# Patient Record
Sex: Male | Born: 1945 | Race: White | Hispanic: No | Marital: Married | Smoking: Former smoker
Health system: Southern US, Community
[De-identification: ages and names within clinical notes are randomized; demographics above are authoritative.]

## PROBLEM LIST (undated history)

## (undated) DIAGNOSIS — Z930 Tracheostomy status: Secondary | ICD-10-CM

## (undated) DIAGNOSIS — M1990 Unspecified osteoarthritis, unspecified site: Secondary | ICD-10-CM

## (undated) DIAGNOSIS — N200 Calculus of kidney: Secondary | ICD-10-CM

## (undated) DIAGNOSIS — Z973 Presence of spectacles and contact lenses: Secondary | ICD-10-CM

## (undated) DIAGNOSIS — R3915 Urgency of urination: Secondary | ICD-10-CM

## (undated) DIAGNOSIS — I10 Essential (primary) hypertension: Secondary | ICD-10-CM

## (undated) DIAGNOSIS — Z8739 Personal history of other diseases of the musculoskeletal system and connective tissue: Secondary | ICD-10-CM

## (undated) DIAGNOSIS — G4733 Obstructive sleep apnea (adult) (pediatric): Secondary | ICD-10-CM

## (undated) DIAGNOSIS — Z8673 Personal history of transient ischemic attack (TIA), and cerebral infarction without residual deficits: Secondary | ICD-10-CM

## (undated) DIAGNOSIS — I6523 Occlusion and stenosis of bilateral carotid arteries: Secondary | ICD-10-CM

## (undated) DIAGNOSIS — Z87442 Personal history of urinary calculi: Secondary | ICD-10-CM

## (undated) DIAGNOSIS — K219 Gastro-esophageal reflux disease without esophagitis: Secondary | ICD-10-CM

## (undated) DIAGNOSIS — R0600 Dyspnea, unspecified: Secondary | ICD-10-CM

## (undated) DIAGNOSIS — R053 Chronic cough: Secondary | ICD-10-CM

## (undated) DIAGNOSIS — N183 Chronic kidney disease, stage 3 (moderate): Secondary | ICD-10-CM

## (undated) DIAGNOSIS — Z7901 Long term (current) use of anticoagulants: Secondary | ICD-10-CM

## (undated) DIAGNOSIS — J189 Pneumonia, unspecified organism: Secondary | ICD-10-CM

## (undated) DIAGNOSIS — R05 Cough: Secondary | ICD-10-CM

## (undated) DIAGNOSIS — E785 Hyperlipidemia, unspecified: Secondary | ICD-10-CM

## (undated) HISTORY — PX: GASTRIC ROUX-EN-Y: SHX5262

## (undated) HISTORY — PX: PERCUTANEOUS NEPHROSTOLITHOTOMY: SHX2207

## (undated) HISTORY — PX: WISDOM TOOTH EXTRACTION: SHX21

## (undated) HISTORY — PX: ORCHIOPEXY: SHX479

## (undated) HISTORY — PX: URETEROSCOPY WITH HOLMIUM LASER LITHOTRIPSY: SHX6645

## (undated) HISTORY — PX: COLONOSCOPY: SHX174

## (undated) HISTORY — PX: TONSILLECTOMY: SUR1361

---
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
## 1985-05-12 HISTORY — PX: TRACHEOSTOMY: SUR1362

## 1998-12-26 ENCOUNTER — Encounter: Payer: Self-pay | Admitting: Urology

## 1998-12-26 ENCOUNTER — Encounter: Payer: Self-pay | Admitting: Internal Medicine

## 1998-12-26 ENCOUNTER — Observation Stay (HOSPITAL_COMMUNITY): Admission: EM | Admit: 1998-12-26 | Discharge: 1998-12-27 | Payer: Self-pay | Admitting: Emergency Medicine

## 2002-03-28 ENCOUNTER — Ambulatory Visit (HOSPITAL_COMMUNITY): Admission: RE | Admit: 2002-03-28 | Discharge: 2002-03-28 | Payer: Self-pay | Admitting: Urology

## 2002-03-28 ENCOUNTER — Encounter: Payer: Self-pay | Admitting: Urology

## 2002-04-14 ENCOUNTER — Observation Stay (HOSPITAL_COMMUNITY): Admission: RE | Admit: 2002-04-14 | Discharge: 2002-04-15 | Payer: Self-pay | Admitting: Urology

## 2002-04-14 ENCOUNTER — Encounter: Payer: Self-pay | Admitting: Urology

## 2005-03-21 ENCOUNTER — Ambulatory Visit: Payer: Self-pay | Admitting: Gastroenterology

## 2005-04-07 ENCOUNTER — Ambulatory Visit: Payer: Self-pay | Admitting: Gastroenterology

## 2005-09-25 ENCOUNTER — Ambulatory Visit (HOSPITAL_BASED_OUTPATIENT_CLINIC_OR_DEPARTMENT_OTHER): Admission: RE | Admit: 2005-09-25 | Discharge: 2005-09-25 | Payer: Self-pay | Admitting: Otolaryngology

## 2005-09-28 ENCOUNTER — Ambulatory Visit: Payer: Self-pay | Admitting: Internal Medicine

## 2008-04-05 ENCOUNTER — Ambulatory Visit: Payer: Self-pay | Admitting: Cardiology

## 2009-09-19 ENCOUNTER — Encounter: Payer: Self-pay | Admitting: Urology

## 2009-09-19 ENCOUNTER — Ambulatory Visit (HOSPITAL_COMMUNITY): Admission: RE | Admit: 2009-09-19 | Discharge: 2009-09-20 | Payer: Self-pay | Admitting: Urology

## 2010-07-30 LAB — CBC: HCT: 42.3 % (ref 39.0–52.0)

## 2010-07-30 LAB — ABO/RH: ABO/RH(D): O NEG

## 2010-09-24 NOTE — Assessment & Plan Note (Signed)
[facility]                            CARDIOLOGY OFFICE NOTE   FRANDY, BASNETT                     MRN:          [id number]  DATE:04/05/2008                            DOB:          [birth year]    PRIMARY:  Ernestina Penna, MD   REASON FOR PRESENTATION:  Evaluate the patient with multiple  cardiovascular risk factors.   HISTORY OF PRESENT ILLNESS:  The patient is a pleasant 65 year old  gentleman who was seen by Dr. Corinda Gubler in 2003.  Apparently, at that  time, he had some T-wave changes, and it was thought that a stress test  would be reasonable.  However, he was at that time too heavy for our  nuclear cameras.  He was seen at [facility], and they determined that one was  not necessary.  He subsequently had gastric bypass surgery in 2004.  He  did lose quite a bit of weight.  He had an exercise treadmill test at  Dr. Kathi Der office in 2006.  This did not demonstrate any evidence of  ischemia or infarct, but it was apparently a submaximal test as he did  not achieve his target heart rate.   Since that time, he has had no overt cardiovascular complaints.  He is  not particularly active, however.  He sometimes walks with his wife for  exercise.  He does not have any chest pressure, neck or arm discomfort.  He does not have any palpitation, presyncope, or syncope.   He does have sleep apnea and has tracheostomy.  He unplugs his system at  night and sleeps well.  He does not have any PND or orthopnea.   PAST MEDICAL HISTORY:  Sleep apnea, hypertension x7-8 years,  hyperlipidemia, morbid obesity, nephrolithiasis.   PAST SURGICAL HISTORY:  Tracheostomy, tongue reduction, septum repair,  gastric bypass surgery.   ALLERGIES:  None.   MEDICATIONS:  1. Caduet 5 mg daily.  2. Nexium 40 mg daily.  3. Multivitamin.  4. Calcium.  5. Aspirin 81 mg daily.   SOCIAL HISTORY:  The patient is married.  He is a Hydrographic surveyor.  He has 3  children.  Quit smoking  in 1970 after one-pack per day for about 8  years.  He does not drink alcohol.   FAMILY HISTORY:  Contributory for his father having coronary disease in  his 42s and 40s.  He is alive at age 76 status post bypass.   REVIEW OF SYSTEMS:  As stated in the HPI and positive for reflux, joint  pains.  Negative for all other systems.   PHYSICAL EXAMINATION:  GENERAL:  The patient is pleasant and in no  distress.  VITAL SIGNS:  Blood pressure 130/64, heart rate 64 and regular, weight  297 pounds, body mass index 42.  HEENT:  Eyelids unremarkable.  Pupils equal, round, and reactive to  light.  Fundi within normal limits.  Oral mucous unremarkable.  NECK:  No jugular venous distention at 45 degrees, carotid upstroke  brisk and symmetric, no bruits, no thyromegaly.  Although the exam is  compromised by his tracheostomy.  LYMPHATICS:  No  cervical, axillary, or inguinal adenopathy.  LUNGS:  Clear to auscultation bilaterally.  BACK:  No costovertebral angle tenderness.  CHEST:  Unremarkable.  HEART:  PMI not displaced or sustained, S1 and S2 within normal limits,  no S3, no S4, no clicks, no rubs, no murmurs.  ABDOMEN:  Morbidly obese, positive bowel sounds normal in frequency and  pitch, no bruits, no rebound, no guarding, no midline pulsatile mass.  No hepatomegaly, no splenomegaly.  SKIN:  No rashes, no nodules.  EXTREMITIES:  Pulses 2+ throughout, no edema, no cyanosis, or clubbing.  NEURO:  Oriented to person, place, and time.  Cranial nerves II through  XII are grossly intact, motor grossly intact.   1. Cardiovascular risk factors.  The patient has multiple      cardiovascular risk factors.  I understand, Dr. Christell Constant, has planned      an exercise treadmill test.  I think this is reasonable.  I think      he has a low pretest probability of obstructive coronary disease.      However, he does have risk factors.  He could be risk stratified.      Most importantly from this I have asked him to  make sure he gets a      prescription for exercise.  He absolutely needs to increase his      aerobic activity.  2. Obesity.  He is starting to have his weight creep up.  We discussed      increased activity to help burn more calories, so he can begin to      bring this down again.  3. Hypertension.  Blood pressure is currently well controlled and he      will continue medications as listed.  4. Dyslipidemia, per Dr. Christell Constant.  It is well controlled, and he will      continue with the meds as listed.  5. Sleep apnea.  The patient had severe sleep apnea and has a close      management of this.  6. Followup.  I will see him back as needed.     Rollene Rotunda, MD, [facility]  Electronically Signed    JH/MedQ  DD: 04/05/2008  DT: 04/06/2008  Job #: [id number]   cc:   Ernestina Penna, M.D.

## 2010-09-27 NOTE — Op Note (Signed)
NAME:  Dennis Spencer, Dennis Spencer                        ACCOUNT NO.:  [id number]   MEDICAL RECORD NO.:  [id number]                   PATIENT TYPE:  AMB   LOCATION:  DAY                                  FACILITY:  [facility]   PHYSICIAN:  Graylin Sperling. Dahlstedt, M.D.          DATE OF BIRTH:  11-17-1945   DATE OF PROCEDURE:  04/14/2002  DATE OF DISCHARGE:                                 OPERATIVE REPORT   PREOPERATIVE DIAGNOSES:  Left ureteral calculi.   POSTOPERATIVE DIAGNOSES:  Left ureteral calculi.   PRINCIPAL PROCEDURE:  Cystoscopy, left ureteroscopy with flexible  ureteroscope, holmium laser lithotripsy of left ureteral calculi, stone  extraction, double J stent placement, interpretive fluoroscopy.   SURGEON:  Kaylib Furness. Dahlstedt, M.D.   ANESTHESIA:  General with tracheostomy tube.   COMPLICATIONS:  None.   BRIEF HISTORY:  This nice 65 year old gentleman has a left ureteral calculi  which we have been following conservatively in the office for approximately  one month. The left ureteral stone located most distal is approximately 9 x  11 mm in size. The patient has had relatively minimal pain, but he has had  nonpassage of three calculi now. Due to his morbid obesity, he is not  considered a candidate for lithotripsy on the extracorporeal machine. Thus,  we have chosen direct ureteroscopy with holmium laser lithotripsy of his  stone. The risks and complications of this procedure have been discussed  with the patient at length. These include but are not limited to injury to  his ureter, bleeding, infection, need for a second procedure. He understands  these and desires to proceed.   DESCRIPTION OF PROCEDURE:  The patient was administered a general anesthetic  after a tracheostomy tube was placed. He was placed in the dorsal lithotomy  position. Genitalia and perineum were prepped and draped. A 22 French  panendoscope was advanced with difficulty over his prostate and into his  bladder.  The left ureter was cannulated with a guidewire which was advanced  under fluoroscopic guidance into the left kidney. A 35 cm ureteral access  catheter was then placed over top of the guidewire. The flexible  ureteroscope was then placed through the access sheath up to the distal most  stone. The 200 and then the 350 micron laser fibers were then used to  attempt fragmentation of the stone. The stone was quite dense, and I was not  able to get significant shattering of the stone but the size of the stone  was diminished with treating the periphery of the stone with the laser. The  smaller stones were easily navigated and fragmented into somewhat smaller  fragments with the laser fiber. After approximately 1 1/2 hours of treating  the large stone, it was felt that I had achieved maximal benefit with the  laser. At this point, I used a nitinol basket to grasp the stone and remove  it once the access sheath had been removed  from the patient. It took a fair  amount of traction to remove this stone but the stone was recovered. The  access sheath was then placed back up in the patient's ureter over the  guidewire and inspection was performed. There were several small fragments  which I was not able to basket and extract. However, it was felt that these  stones should pass on their own. No significant ureteral stricture was  noted. Following extraction of the large fragment, the double J stent was  placed in  the left ureter. A 26 cm x 6 French Surgitek stent was placed using  fluoroscopic and cystoscopic guidance. Good localization was seen proximally  and distally. At this point, the scope was removed after the bladder was  drained.   The patient tolerated the procedure well. He is awakened and taken to PACU  in stable condition.                                               Bertram Millard. Dahlstedt, M.D.    SMD/MEDQ  D:  04/14/2002  T:  04/14/2002  Job:  [id number]

## 2010-09-27 NOTE — Procedures (Signed)
[name], Dennis Spencer              ACCOUNT NO.:  [id number]   MEDICAL RECORD NO.:  [id number]          PATIENT TYPE:  OUT   LOCATION:  SLEEP CENTER                 FACILITY:  [facility]   PHYSICIAN:  Clinton D. Maple Hudson, M.D. DATE OF BIRTH:  July 17, 1945   DATE OF STUDY:  09/25/2005                              NOCTURNAL POLYSOMNOGRAM   REFERRING PHYSICIAN:  Dr. Flo Shanks.   INDICATIONS FOR STUDY:  Hypersomnia with sleep apnea.   EPWORTH SLEEPINESS SCORE:  04/24.   BMI:  37.8.   WEIGHT:  265 pounds.   HOME MEDICATIONS:  Nexium 40 mg, Caduet 5/20, aspirin.   The patient has a history of obstructive sleep apnea and prior surgery  Including chronic tracheostomy.  A split protocol study was requested with  tracheostomy capped.   SLEEP ARCHITECTURE:  Total sleep time 339 minutes with sleep efficiency 83%.  Stage I was 12%, stage II 73%, stages III and IV were absent, REM 14% of  total sleep time.  Sleep latency 11 minutes, REM latency 71 minutes, awake  after sleep onset 61 minutes, arousal index increased at 59.5.  No bedtime  medication was taken.   RESPIRATORY DATA:  Split study protocol.  Apnea/hypopnea index (AHI, RDI)  76.8 obstructive events per hour indicating severe obstructive sleep  apnea/hypopnea syndrome.  This included 143 obstructive apneas and 33  hypopneas before CPAP.  The study was performed with a tracheostomy capped.  He slept only supine.  REM AHI of 31.8 per hour.  CPAP was titrated 22 CWP,  AHI 2 per hour.  A small ResMed ultra mirage full-face mask was used with  heated humidifier.   OXYGEN DATA:  Moderate snoring with oxygen desaturation to a nadir of 78%  before CPAP control.  After CPAP mean oxygen saturation held 95-96% on room  air.   CARDIAC DATA:  Normal sinus rhythm.   MOVEMENT/PARASOMNIA:  Occasional limb jerk with arousal, insignificant.  No  bathroom trips.   IMPRESSION/RECOMMENDATIONS:  1.  Severe obstructive sleep apnea/hypopnea syndrome,  AHI 76.8 per hour      while sleeping only supine.  Moderate snoring with oxygen desaturation      to a nadir of 78%.  The tracheostomy was capped throughout the study.  2.  Successful CPAP titration to 22 CWP, AHI 2 per hour.  A small ResMed      ultra mirage full-face mask was used      with heated humidity.  This pressure will require use of a BiPap machine      with a suggested initial setting of inspiratory 22 expiratory 15.      Clinton D. Maple Hudson, M.D.  Diplomate, Biomedical engineer of Sleep Medicine  Electronically Signed     CDY/MEDQ  D:  09/28/2005 15:07:56  T:  09/29/2005 10:44:17  Job:  [id number]

## 2012-08-10 ENCOUNTER — Other Ambulatory Visit: Payer: Self-pay | Admitting: *Deleted

## 2012-08-10 ENCOUNTER — Other Ambulatory Visit: Payer: Self-pay | Admitting: Physician Assistant

## 2012-08-10 ENCOUNTER — Other Ambulatory Visit: Payer: Self-pay

## 2012-08-10 DIAGNOSIS — M549 Dorsalgia, unspecified: Secondary | ICD-10-CM

## 2012-08-10 MED ORDER — GABAPENTIN 600 MG PO TABS: ORAL_TABLET | ORAL | Status: DC

## 2012-08-10 NOTE — Telephone Encounter (Signed)
Refill on gabapentin 600 mg ,half tab dily, qty 30 per ACM,  [facility]

## 2012-08-10 NOTE — Telephone Encounter (Signed)
Not seen since 10/13

## 2012-09-15 ENCOUNTER — Other Ambulatory Visit: Payer: Self-pay | Admitting: Nurse Practitioner

## 2012-10-11 ENCOUNTER — Telehealth: Payer: Self-pay | Admitting: *Deleted

## 2012-10-11 DIAGNOSIS — M549 Dorsalgia, unspecified: Secondary | ICD-10-CM

## 2012-10-11 MED ORDER — ATORVASTATIN CALCIUM 40 MG PO TABS: 40.0000 mg | ORAL_TABLET | Freq: Every day | ORAL | Status: DC

## 2012-10-11 MED ORDER — GABAPENTIN 600 MG PO TABS: ORAL_TABLET | ORAL | Status: DC

## 2012-10-11 NOTE — Telephone Encounter (Signed)
done

## 2012-11-06 ENCOUNTER — Other Ambulatory Visit: Payer: Self-pay | Admitting: Nurse Practitioner

## 2012-11-08 NOTE — Telephone Encounter (Signed)
Patient should come in if she needs more medication for cough

## 2012-11-08 NOTE — Telephone Encounter (Signed)
LAST OV 10/13. NTBS. DIDN'T SEE MED IN EPIC BUT WAS IN PAPER CHART.

## 2012-11-09 ENCOUNTER — Ambulatory Visit (INDEPENDENT_AMBULATORY_CARE_PROVIDER_SITE_OTHER): Payer: BC Managed Care – PPO | Admitting: Family Medicine

## 2012-11-09 ENCOUNTER — Encounter: Payer: Self-pay | Admitting: Family Medicine

## 2012-11-09 VITALS — BP 151/73 | HR 98 | Temp 98.1°F | Wt 295.8 lb

## 2012-11-09 DIAGNOSIS — I10 Essential (primary) hypertension: Secondary | ICD-10-CM

## 2012-11-09 LAB — POCT CBC
Granulocyte percent: 65 %G (ref 37–80)
Lymph, poc: 1.8 (ref 0.6–3.4)
MCV: 92.2 fL (ref 80–97)
MPV: 7.8 fL (ref 0–99.8)
POC Granulocyte: 3.8 (ref 2–6.9)
POC LYMPH PERCENT: 31.7 %L (ref 10–50)
Platelet Count, POC: 218 10*3/uL (ref 142–424)
RBC: 4.9 M/uL (ref 4.69–6.13)
RDW, POC: 12.7 %
WBC: 5.8 10*3/uL (ref 4.6–10.2)

## 2012-11-09 NOTE — Progress Notes (Signed)
Patient ID: Dennis Spencer, male   DOB: Apr 30, 1946, 67 y.o.   MRN: [id number]   Patient here for follow-up of elevated blood pressure.  He is not exercising and is predominantly  adherent to a low-salt diet.  Blood pressure is well controlled at home. Cardiac symptoms: none. Patient denies: chest pain, claudication, dyspnea and exertional chest pressure/discomfort. Cardiovascular risk factors: dyslipidemia, male gender and obesity (BMI >= 30 kg/m2). Use of agents associated with hypertension: none. History of target organ damage: none.  The following portions of the patient's history were reviewed and updated as appropriate: allergies, current medications, past family history, past medical history, past social history, past surgical history and problem list.  Review of Systems Pertinent items are noted in HPI.     Objective:    BP 151/73  Pulse 98  Temp(Src) 98.1 F (36.7 C) (Oral)  Wt 295 lb 12.8 oz (134.174 kg)  General Appearance:    Alert, cooperative, no distress, appears stated age  Head:    Normocephalic, without obvious abnormality, atraumatic  Eyes:    PERRL, conjunctiva/corneas clear, EOM's intact, fundi    benign, both eyes       Ears:    Normal TM's and external ear canals, both ears  Nose:   Nares normal, septum midline, mucosa normal, no drainage    or sinus tenderness  Throat:   Lips, mucosa, and tongue normal; teeth and gums normal  Neck:   Supple, symmetrical, trachea midline, no adenopathy;       thyroid:  No enlargement/tenderness/nodules; no carotid   bruit or JVD     Lungs:     Clear to auscultation bilaterally, respirations unlabored  Chest wall:    No tenderness or deformity  Heart:    Regular rate and rhythm, S1 and S2 normal, no murmur, rub   or gallop  Abdomen:     Soft, non-tender, bowel sounds active all four quadrants,    no masses, no organomegaly        Extremities:   Extremities normal, atraumatic, no cyanosis or edema  Pulses:   2+ and symmetric  all extremities  Skin:   Skin color, texture, turgor normal, no rashes or lesions            Assessment:    Hypertension, stage 1 . Evidence of target organ damage: none.    Plan:    Medication: no change.  HTN (hypertension) - Plan: POCT CBC, Comprehensive metabolic panel, LDL Cholesterol, Direct, POCT glycosylated hemoglobin (Hb A1C)  Check baseline labs.  Come back in 2-3 months for reevaluation and BP recheck.    Subjective:    Patient here for follow-up of elevated blood pressure.  He {is/is not:9024} exercising and {is/is not:9024} adherent to a low-salt diet.  Blood pressure {is/is not:9024} well controlled at home. Cardiac symptoms: {symptoms:12860}. Patient denies: {symptoms; cardiac:19145}. Cardiovascular risk factors: {risk factors:510}. Use of agents associated with hypertension: {meds:511::"none"}. History of target organ damage: {diagnoses:309 560 7752}.  {Common ambulatory SmartLinks:19316}  Review of Systems {ros; complete:30496}     Objective:    {exam; complete:17964}    Assessment:    Hypertension, {control:10681} ***. Evidence of target organ damage: {target organ:309 560 7752}.    Plan:    {[phone]}

## 2012-11-10 ENCOUNTER — Other Ambulatory Visit: Payer: Self-pay | Admitting: Family Medicine

## 2012-11-10 DIAGNOSIS — M549 Dorsalgia, unspecified: Secondary | ICD-10-CM

## 2012-11-10 LAB — COMPREHENSIVE METABOLIC PANEL
ALT: 12 U/L (ref 0–53)
AST: 18 U/L (ref 0–37)
Albumin: 3.9 g/dL (ref 3.5–5.2)
Alkaline Phosphatase: 63 U/L (ref 39–117)
BUN: 13 mg/dL (ref 6–23)
Calcium: 9.2 mg/dL (ref 8.4–10.5)
Chloride: 104 mEq/L (ref 96–112)
Potassium: 5 mEq/L (ref 3.5–5.3)
Sodium: 140 mEq/L (ref 135–145)
Total Protein: 6.3 g/dL (ref 6.0–8.3)

## 2012-11-10 MED ORDER — AMLODIPINE BESYLATE 5 MG PO TABS: 5.0000 mg | ORAL_TABLET | Freq: Every day | ORAL | Status: DC

## 2012-11-10 MED ORDER — GABAPENTIN 600 MG PO TABS: ORAL_TABLET | ORAL | Status: DC

## 2012-11-10 NOTE — Telephone Encounter (Signed)
Pt [facility] and that he could come and get the shingles shot and Tdap. Pt plans to come in the next week or 2 to get the vaccines

## 2013-05-06 ENCOUNTER — Other Ambulatory Visit: Payer: Self-pay | Admitting: Family Medicine

## 2013-06-15 ENCOUNTER — Other Ambulatory Visit: Payer: Self-pay | Admitting: Family Medicine

## 2013-06-17 NOTE — Telephone Encounter (Signed)
Last seen and last lipid 11/09/12  Dr Ernestina Patches

## 2013-06-18 ENCOUNTER — Encounter (HOSPITAL_COMMUNITY): Payer: Medicare HMO | Admitting: Anesthesiology

## 2013-06-18 ENCOUNTER — Encounter (HOSPITAL_COMMUNITY)
Admission: EM | Disposition: A | Discharge status: Home / Self Care | Payer: Self-pay | Source: Home / Self Care | Attending: Urology

## 2013-06-18 ENCOUNTER — Inpatient Hospital Stay (HOSPITAL_COMMUNITY)
Admission: EM | Admit: 2013-06-18 | Discharge: 2013-06-21 | DRG: 669 | Disposition: A | Discharge status: Home / Self Care | Payer: Medicare HMO | Attending: Urology | Admitting: Urology

## 2013-06-18 ENCOUNTER — Encounter (HOSPITAL_COMMUNITY): Payer: Self-pay | Admitting: Emergency Medicine

## 2013-06-18 ENCOUNTER — Emergency Department (HOSPITAL_COMMUNITY): Payer: Medicare HMO

## 2013-06-18 ENCOUNTER — Emergency Department (HOSPITAL_COMMUNITY): Payer: Medicare HMO | Admitting: Anesthesiology

## 2013-06-18 DIAGNOSIS — E875 Hyperkalemia: Secondary | ICD-10-CM | POA: Diagnosis not present

## 2013-06-18 DIAGNOSIS — N179 Acute kidney failure, unspecified: Secondary | ICD-10-CM | POA: Diagnosis present

## 2013-06-18 DIAGNOSIS — N1330 Unspecified hydronephrosis: Secondary | ICD-10-CM | POA: Diagnosis present

## 2013-06-18 DIAGNOSIS — Z23 Encounter for immunization: Secondary | ICD-10-CM

## 2013-06-18 DIAGNOSIS — N210 Calculus in bladder: Secondary | ICD-10-CM | POA: Diagnosis present

## 2013-06-18 DIAGNOSIS — N201 Calculus of ureter: Principal | ICD-10-CM | POA: Diagnosis present

## 2013-06-18 DIAGNOSIS — G4730 Sleep apnea, unspecified: Secondary | ICD-10-CM | POA: Diagnosis present

## 2013-06-18 DIAGNOSIS — D6489 Other specified anemias: Secondary | ICD-10-CM | POA: Diagnosis present

## 2013-06-18 DIAGNOSIS — Z930 Tracheostomy status: Secondary | ICD-10-CM

## 2013-06-18 DIAGNOSIS — R339 Retention of urine, unspecified: Secondary | ICD-10-CM | POA: Diagnosis present

## 2013-06-18 DIAGNOSIS — N390 Urinary tract infection, site not specified: Secondary | ICD-10-CM

## 2013-06-18 DIAGNOSIS — Z87891 Personal history of nicotine dependence: Secondary | ICD-10-CM

## 2013-06-18 DIAGNOSIS — E7800 Pure hypercholesterolemia, unspecified: Secondary | ICD-10-CM | POA: Diagnosis present

## 2013-06-18 DIAGNOSIS — Z9884 Bariatric surgery status: Secondary | ICD-10-CM

## 2013-06-18 DIAGNOSIS — N200 Calculus of kidney: Secondary | ICD-10-CM | POA: Diagnosis present

## 2013-06-18 DIAGNOSIS — I10 Essential (primary) hypertension: Secondary | ICD-10-CM | POA: Diagnosis present

## 2013-06-18 DIAGNOSIS — N19 Unspecified kidney failure: Secondary | ICD-10-CM

## 2013-06-18 DIAGNOSIS — Z6839 Body mass index (BMI) 39.0-39.9, adult: Secondary | ICD-10-CM

## 2013-06-18 HISTORY — PX: HOLMIUM LASER APPLICATION: SHX5852

## 2013-06-18 HISTORY — PX: CYSTOSCOPY WITH RETROGRADE PYELOGRAM, URETEROSCOPY AND STENT PLACEMENT: SHX5789

## 2013-06-18 HISTORY — DX: Essential (primary) hypertension: I10

## 2013-06-18 LAB — CBC WITH DIFFERENTIAL/PLATELET
Basophils Absolute: 0 K/uL (ref 0.0–0.1)
Basophils Relative: 1 % (ref 0–1)
Eosinophils Absolute: 0.3 K/uL (ref 0.0–0.7)
Eosinophils Relative: 4 % (ref 0–5)
HCT: 37.6 % — ABNORMAL LOW (ref 39.0–52.0)
Hemoglobin: 12.9 g/dL — ABNORMAL LOW (ref 13.0–17.0)
Lymphocytes Relative: 25 % (ref 12–46)
Lymphs Abs: 1.6 K/uL (ref 0.7–4.0)
MCH: 31.2 pg (ref 26.0–34.0)
MCHC: 34.3 g/dL (ref 30.0–36.0)
MCV: 90.8 fL (ref 78.0–100.0)
Monocytes Absolute: 0.6 K/uL (ref 0.1–1.0)
Monocytes Relative: 10 % (ref 3–12)
Neutro Abs: 3.8 K/uL (ref 1.7–7.7)
Neutrophils Relative %: 61 % (ref 43–77)
Platelets: 207 K/uL (ref 150–400)
RBC: 4.14 MIL/uL — ABNORMAL LOW (ref 4.22–5.81)
RDW: 13 % (ref 11.5–15.5)
WBC: 6.3 K/uL (ref 4.0–10.5)

## 2013-06-18 LAB — URINALYSIS, ROUTINE W REFLEX MICROSCOPIC
Bilirubin Urine: NEGATIVE
Glucose, UA: NEGATIVE mg/dL
Ketones, ur: NEGATIVE mg/dL
Nitrite: NEGATIVE
Protein, ur: 100 mg/dL — AB
Specific Gravity, Urine: 1.011 (ref 1.005–1.030)
Urobilinogen, UA: 0.2 mg/dL (ref 0.0–1.0)
pH: 6 (ref 5.0–8.0)

## 2013-06-18 LAB — URINE MICROSCOPIC-ADD ON

## 2013-06-18 LAB — BASIC METABOLIC PANEL
BUN: 36 mg/dL — AB (ref 6–23)
CALCIUM: 8.8 mg/dL (ref 8.4–10.5)
CO2: 22 mEq/L (ref 19–32)
Chloride: 100 mEq/L (ref 96–112)
Creatinine, Ser: 6.25 mg/dL — ABNORMAL HIGH (ref 0.50–1.35)
GFR, EST AFRICAN AMERICAN: 10 mL/min — AB (ref >90)
GFR, EST NON AFRICAN AMERICAN: 8 mL/min — AB (ref >90)
Glucose, Bld: 99 mg/dL (ref 70–99)
POTASSIUM: 4.7 meq/L (ref 3.7–5.3)
Sodium: 140 mEq/L (ref 137–147)

## 2013-06-18 SURGERY — CYSTOURETEROSCOPY, WITH RETROGRADE PYELOGRAM AND STENT INSERTION
Anesthesia: General | Laterality: Bilateral

## 2013-06-18 MED ORDER — SODIUM CHLORIDE 0.9 % IV SOLN
Freq: Once | INTRAVENOUS | Status: AC
  Administered 2013-06-18: 15:00:00 via INTRAVENOUS

## 2013-06-18 MED ORDER — FENTANYL CITRATE 0.05 MG/ML IJ SOLN: 25.0000 ug | INTRAMUSCULAR | Status: DC | PRN

## 2013-06-18 MED ORDER — CEFTRIAXONE SODIUM 1 G IJ SOLR
1.0000 g | Freq: Once | INTRAMUSCULAR | Status: AC
  Administered 2013-06-18: 1 g via INTRAVENOUS
  Filled 2013-06-18: qty 10

## 2013-06-18 MED ORDER — SENNA 8.6 MG PO TABS
1.0000 | ORAL_TABLET | Freq: Two times a day (BID) | ORAL | Status: DC
  Administered 2013-06-18 – 2013-06-21 (×6): 8.6 mg via ORAL
  Filled 2013-06-18 (×6): qty 1

## 2013-06-18 MED ORDER — DEXAMETHASONE SODIUM PHOSPHATE 10 MG/ML IJ SOLN
INTRAMUSCULAR | Status: DC | PRN
  Administered 2013-06-18: 10 mg via INTRAVENOUS

## 2013-06-18 MED ORDER — PROPOFOL 10 MG/ML IV BOLUS
INTRAVENOUS | Status: AC
  Filled 2013-06-18: qty 20

## 2013-06-18 MED ORDER — NEOSTIGMINE METHYLSULFATE 1 MG/ML IJ SOLN
INTRAMUSCULAR | Status: DC | PRN
  Administered 2013-06-18: 4 mg via INTRAVENOUS

## 2013-06-18 MED ORDER — IOHEXOL 300 MG/ML  SOLN
INTRAMUSCULAR | Status: DC | PRN
  Administered 2013-06-18: 50 mL via URETHRAL

## 2013-06-18 MED ORDER — DEXAMETHASONE SODIUM PHOSPHATE 10 MG/ML IJ SOLN
INTRAMUSCULAR | Status: AC
  Filled 2013-06-18: qty 1

## 2013-06-18 MED ORDER — CISATRACURIUM BESYLATE (PF) 10 MG/5ML IV SOLN
INTRAVENOUS | Status: DC | PRN
  Administered 2013-06-18: 5 mg via INTRAVENOUS

## 2013-06-18 MED ORDER — OXYCODONE-ACETAMINOPHEN 5-325 MG PO TABS
1.0000 | ORAL_TABLET | ORAL | Status: DC | PRN
  Administered 2013-06-19 (×2): 1 via ORAL
  Filled 2013-06-18 (×2): qty 1

## 2013-06-18 MED ORDER — AMLODIPINE BESYLATE 5 MG PO TABS
5.0000 mg | ORAL_TABLET | Freq: Every day | ORAL | Status: DC
  Administered 2013-06-19 – 2013-06-21 (×3): 5 mg via ORAL
  Filled 2013-06-18 (×3): qty 1

## 2013-06-18 MED ORDER — PANTOPRAZOLE SODIUM 40 MG PO TBEC
40.0000 mg | DELAYED_RELEASE_TABLET | Freq: Every day | ORAL | Status: DC
  Administered 2013-06-19 – 2013-06-21 (×3): 40 mg via ORAL
  Filled 2013-06-18 (×3): qty 1

## 2013-06-18 MED ORDER — DOCUSATE SODIUM 100 MG PO CAPS
100.0000 mg | ORAL_CAPSULE | Freq: Two times a day (BID) | ORAL | Status: DC
  Administered 2013-06-18 – 2013-06-21 (×6): 100 mg via ORAL
  Filled 2013-06-18 (×7): qty 1

## 2013-06-18 MED ORDER — FENTANYL CITRATE 0.05 MG/ML IJ SOLN
INTRAMUSCULAR | Status: DC | PRN
  Administered 2013-06-18 (×2): 50 ug via INTRAVENOUS

## 2013-06-18 MED ORDER — ONDANSETRON HCL 4 MG/2ML IJ SOLN
INTRAMUSCULAR | Status: AC
  Filled 2013-06-18: qty 2

## 2013-06-18 MED ORDER — GLYCOPYRROLATE 0.2 MG/ML IJ SOLN
INTRAMUSCULAR | Status: DC | PRN
  Administered 2013-06-18: .6 mg via INTRAVENOUS

## 2013-06-18 MED ORDER — SODIUM CHLORIDE 0.9 % IV SOLN
INTRAVENOUS | Status: DC
  Administered 2013-06-18 – 2013-06-20 (×5): via INTRAVENOUS

## 2013-06-18 MED ORDER — ASPIRIN 81 MG PO CHEW
81.0000 mg | CHEWABLE_TABLET | Freq: Every day | ORAL | Status: DC
  Administered 2013-06-19 – 2013-06-21 (×3): 81 mg via ORAL
  Filled 2013-06-18 (×3): qty 1

## 2013-06-18 MED ORDER — LACTATED RINGERS IV SOLN
INTRAVENOUS | Status: DC | PRN
  Administered 2013-06-18: 18:00:00 via INTRAVENOUS

## 2013-06-18 MED ORDER — PROMETHAZINE HCL 25 MG/ML IJ SOLN: 6.2500 mg | INTRAMUSCULAR | Status: DC | PRN

## 2013-06-18 MED ORDER — HYDROMORPHONE HCL PF 1 MG/ML IJ SOLN: 0.5000 mg | INTRAMUSCULAR | Status: DC | PRN

## 2013-06-18 MED ORDER — FENTANYL CITRATE 0.05 MG/ML IJ SOLN
INTRAMUSCULAR | Status: AC
  Filled 2013-06-18: qty 2

## 2013-06-18 MED ORDER — OXYBUTYNIN CHLORIDE 5 MG PO TABS
5.0000 mg | ORAL_TABLET | Freq: Three times a day (TID) | ORAL | Status: DC | PRN
  Filled 2013-06-18: qty 1

## 2013-06-18 MED ORDER — PROPOFOL 10 MG/ML IV BOLUS
INTRAVENOUS | Status: DC | PRN
  Administered 2013-06-18 (×2): 50 mg via INTRAVENOUS
  Administered 2013-06-18: 300 mg via INTRAVENOUS

## 2013-06-18 MED ORDER — LIDOCAINE HCL (CARDIAC) 20 MG/ML IV SOLN
INTRAVENOUS | Status: AC
  Filled 2013-06-18: qty 5

## 2013-06-18 MED ORDER — SODIUM CHLORIDE 0.9 % IR SOLN
Status: DC | PRN
  Administered 2013-06-18 (×3): 1000 mL via INTRAVESICAL

## 2013-06-18 MED ORDER — ATORVASTATIN CALCIUM 40 MG PO TABS
40.0000 mg | ORAL_TABLET | Freq: Every day | ORAL | Status: DC
  Administered 2013-06-19 – 2013-06-20 (×2): 40 mg via ORAL
  Filled 2013-06-18 (×3): qty 1

## 2013-06-18 MED ORDER — ONDANSETRON HCL 4 MG/2ML IJ SOLN
INTRAMUSCULAR | Status: DC | PRN
  Administered 2013-06-18: 4 mg via INTRAVENOUS

## 2013-06-18 MED ORDER — ONDANSETRON HCL 4 MG/2ML IJ SOLN
4.0000 mg | INTRAMUSCULAR | Status: DC | PRN
Start: 2013-06-18 — End: 2013-06-21

## 2013-06-18 MED ORDER — CALCIUM CITRATE 950 (200 CA) MG PO TABS
200.0000 mg | ORAL_TABLET | Freq: Every day | ORAL | Status: DC
  Administered 2013-06-19 – 2013-06-21 (×3): 200 mg via ORAL
  Filled 2013-06-18 (×4): qty 1

## 2013-06-18 SURGICAL SUPPLY — 26 items
BAG URINE DRAINAGE (UROLOGICAL SUPPLIES) ×3 IMPLANT
BAG URINE LEG 500ML (DRAIN) ×2 IMPLANT
BASKET LASER NITINOL 1.9FR (BASKET) ×2 IMPLANT
BASKET STNLS GEMINI 4WIRE 3FR (BASKET) IMPLANT
BASKET ZERO TIP NITINOL 2.4FR (BASKET) IMPLANT
BSKT STON RTRVL 120 1.9FR (BASKET) ×1
BSKT STON RTRVL GEM 120X11 3FR (BASKET)
BSKT STON RTRVL ZERO TP 2.4FR (BASKET)
CATH INTERMIT  6FR 70CM (CATHETERS) ×3 IMPLANT
CATH TIEMANN FOLEY 18FR 5CC (CATHETERS) ×2 IMPLANT
CLOTH BEACON ORANGE TIMEOUT ST (SAFETY) ×3 IMPLANT
DRAPE CAMERA CLOSED 9X96 (DRAPES) ×3 IMPLANT
ELECT REM PT RETURN 9FT ADLT (ELECTROSURGICAL)
ELECTRODE REM PT RTRN 9FT ADLT (ELECTROSURGICAL) IMPLANT
FIBER LASER FLEXIVA 200 (UROLOGICAL SUPPLIES) ×2 IMPLANT
FIBER LASER FLEXIVA 365 (UROLOGICAL SUPPLIES) IMPLANT
GLOVE BIOGEL M STRL SZ7.5 (GLOVE) ×3 IMPLANT
GOWN STRL REUS W/TWL LRG LVL3 (GOWN DISPOSABLE) ×3 IMPLANT
GUIDEWIRE ANG ZIPWIRE 038X150 (WIRE) ×3 IMPLANT
GUIDEWIRE STR DUAL SENSOR (WIRE) ×3 IMPLANT
IV NS IRRIG 3000ML ARTHROMATIC (IV SOLUTION) ×3 IMPLANT
PACK CYSTO (CUSTOM PROCEDURE TRAY) ×3 IMPLANT
STENT CONTOUR 6FRX26X.038 (STENTS) ×4 IMPLANT
SYRINGE 10CC LL (SYRINGE) IMPLANT
SYRINGE IRR TOOMEY STRL 70CC (SYRINGE) IMPLANT
TUBE FEEDING 8FR 16IN STR KANG (MISCELLANEOUS) ×3 IMPLANT

## 2013-06-18 NOTE — Preoperative (Signed)
Beta Blockers   Reason not to administer Beta Blockers:Not Applicable 

## 2013-06-18 NOTE — ED Provider Notes (Signed)
CSN: [id number]     Arrival date & time 06/18/13  1211 History   First MD Initiated Contact with Patient 06/18/13 1223     Chief Complaint  Patient presents with  . Urinary Retention   (Consider location/radiation/quality/duration/timing/severity/associated sxs/prior Treatment) HPI 68 year old male has a history of kidney stones in the past, he states he is painlessly passing multiple kidney stones of the last several weeks, he states now in the last day and a half to 2 days however he has decreased urine output but is still passing stones without hematuria or pain, he does not have any urge to urinate he does not feel as if he has urinary retention, he has no abdominal pain no back pain no chest pain no shortness breath no vomiting no diarrhea no bloody urine no testicular pain no dysuria no treatment prior to arrival and no other concerns. His last urine output was about 4 hours prior to arrival a small amount. Past Medical History  Diagnosis Date  . Kidney stones   . Hypertension   . Hypercholesteremia   . Sleep apnea   . Osteomyelitis    Past Surgical History  Procedure Laterality Date  . Lithotripsy    . Lithotripsy    . Percutaneous nephrostolithotomy     No family history on file. History  Substance Use Topics  . Smoking status: Former Smoker    Types: Cigarettes    Quit date: 05/12/1968  . Smokeless tobacco: Not on file  . Alcohol Use: No    Review of Systems 10 Systems reviewed and are negative for acute change except as noted in the HPI. Allergies  Review of patient's allergies indicates no known allergies.  Home Medications   No current outpatient prescriptions on file. BP 142/61  Pulse 75  Temp(Src) 98.1 F (36.7 C) (Oral)  Resp 16  SpO2 97% Physical Exam  Nursing note and vitals reviewed. Constitutional:  Awake, alert, nontoxic appearance.  HENT:  Head: Atraumatic.  Eyes: Right eye exhibits no discharge. Left eye exhibits no discharge.  Neck: Neck  supple.  Cardiovascular: Normal rate and regular rhythm.   No murmur heard. Pulmonary/Chest: Effort normal and breath sounds normal. No respiratory distress. He has no wheezes. He has no rales. He exhibits no tenderness.  Abdominal: Soft. Bowel sounds are normal. He exhibits no distension and no mass. There is no tenderness. There is no rebound and no guarding.  Limited bedside ultrasound reveals what appears to be a small bladder  Musculoskeletal: He exhibits no tenderness.  Baseline ROM, no obvious new focal weakness.  Neurological: He is alert.  Mental status and motor strength appears baseline for patient and situation.  Skin: No rash noted.  Psychiatric: He has a normal mood and affect.    ED Course  Procedures (including critical care time) Patient / Family / Caregiver informed of clinical course, understand medical decision-making process, and agree with plan.Pt stable in ED with no significant deterioration in condition. D/w Urology who will see Pt in ED for OR/admit. [place], ROUTINE W REFLEX MICROSCOPIC - Abnormal; Notable for the following:    APPearance CLOUDY (*)    Hgb urine dipstick MODERATE (*)    Protein, ur 100 (*)    Leukocytes, UA MODERATE (*)    All other components within normal limits  BASIC METABOLIC PANEL - Abnormal; Notable for the following:    BUN 36 (*)    Creatinine, Ser 6.25 (*)    GFR  calc non Af Amer 8 (*)    GFR calc Af Amer 10 (*)    All other components within normal limits  CBC WITH DIFFERENTIAL - Abnormal; Notable for the following:    RBC 4.14 (*)    Hemoglobin 12.9 (*)    HCT 37.6 (*)    All other components within normal limits  URINE MICROSCOPIC-ADD ON - Abnormal; Notable for the following:    Bacteria, UA FEW (*)    Crystals CA OXALATE CRYSTALS (*)    All other components within normal limits  URINE CULTURE   Imaging Review Ct Abdomen Pelvis Wo Contrast  06/18/2013   CLINICAL DATA:  Urinary  retention.  EXAM: CT ABDOMEN AND PELVIS WITHOUT CONTRAST  TECHNIQUE: Multidetector CT imaging of the abdomen and pelvis was performed following the standard protocol without intravenous contrast.  COMPARISON:  CT UROGRAM dated 08/15/2009  FINDINGS: Lower Chest: Clear lung bases. Normal heart size without pericardial or pleural effusion. Right coronary artery atherosclerosis. A small hiatal hernia.  Abdomen/Pelvis: Normal infused appearance of the liver, spleen. Surgical changes within the proximal stomach. Fatty atrophy throughout the pancreas.  A small stone within a contracted gallbladder, unchanged. No pericholecystic edema or biliary ductal dilatation.  Normal adrenal glands.  Bilateral renal collecting system stones. Larger stone burden on the left. Extensive left-sided renal cortical thinning, especially in the lower pole. Moderate bilateral hydroureteronephrosis is increased since 08/15/2009. Multiple stones are identified within the left extrarenal pelvis. Distal left ureteric stones x2. The larger measures 8 x 8 mm on coronal image 75. Mid right ureteric stones measure conglomerate 1.3 cm on image 66 transverse. Just inferior to this is a 11 mm stone.  Multiple bladder calculi.  Aortic and branch vessel atherosclerosis. No retroperitoneal or retrocrural adenopathy. Rectal underdistention  Scattered colonic diverticula. Normal terminal ileum and appendix. There are surgical changes within the mid small bowel. No obstruction.  No pelvic adenopathy. Normal prostate, without significant free pelvic fluid.  Bones/Musculoskeletal: Mild chronic wedging of the superior endplate of L3.  IMPRESSION: 1. Moderate bilateral hydroureteronephrosis. Multiple large ureteric stones bilaterally. Multiple bladder calculi. 2. Nephrolithiasis.  Markedly scarred left kidney. 3. Cholelithiasis. 4. Coronary artery atherosclerosis.   Electronically Signed   By: Abigail Miyamoto M.D.   On: 06/18/2013 15:30      MDM   1. Renal  failure   2. Ureterolithiasis   3. UTI (urinary tract infection)    The patient appears reasonably stabilized for admission considering the current resources, flow, and capabilities available in the ED at this time, and I doubt any other [facility] requiring further screening and/or treatment in the ED prior to admission.    Babette Relic, MD 06/18/13 2024

## 2013-06-18 NOTE — Anesthesia Procedure Notes (Signed)
Procedure Name: Intubation Date/Time: 06/18/2013 6:40 PM Performed by: Danley Danker L Patient Re-evaluated:Patient Re-evaluated prior to inductionOxygen Delivery Method: Circle system utilized Preoxygenation: Pre-oxygenation with 100% oxygen Intubation Type: IV induction Tube size: 6.0 mm Number of attempts: 1 Airway Equipment and Method: Tracheostomy Placement Confirmation: positive ETCO2 and breath sounds checked- equal and bilateral Tube secured with: Tape Comments: Patient with 6.0 Trach.  Trach removed and replaced with oral 6.0 ETT without difficulty.

## 2013-06-18 NOTE — Transfer of Care (Signed)
Immediate Anesthesia Transfer of Care Note  Patient: Dennis Spencer  Procedure(s) Performed: Procedure(s): CYSTOSCOPY WITH RETROGRADE PYELOGRAM, Left URETEROSCOPY with laser , AND bilateral STENT PLACEMENT (Bilateral) HOLMIUM LASER APPLICATION (Bilateral)  Patient Location: PACU  Anesthesia Type:General  Level of Consciousness: awake, alert  and oriented  Airway & Oxygen Therapy: Patient Spontanous Breathing and Patient connected to face mask oxygen  Post-op Assessment: Report given to PACU RN and Post -op Vital signs reviewed and stable  Post vital signs: Reviewed and stable  Complications: No apparent anesthesia complications

## 2013-06-18 NOTE — ED Notes (Signed)
Patient presents today with a chief complaint of urinary retention since Friday morning around midnight. Patient reports he's urinating about 50cc of urine twice daily, denies fullness and pressure to bladder. Patient has a history of kidney stones and reports he's passed about 7 stones since yesterday.

## 2013-06-18 NOTE — ED Notes (Signed)
Pt given urinal and made aware of the need for urine

## 2013-06-18 NOTE — ED Notes (Signed)
Patient transported to CT 

## 2013-06-18 NOTE — H&P (Signed)
Dennis Spencer is an 68 y.o. male.    Chief Complaint:  Bilateral Large Ureteral Stones, Acute Renal Failure  HPI:   1 - Bilateral Large Ureteral Stones, Acute Renal Failure - Long h/o nephrolithiasis with prior ureteroscopy, PCNL, lithotripsy x many. No GU eval in > 5 years. Presents to ER today with >24 hour anuria and noted to have very large volume bilateral ureteral and renal stones including Rt 57m and 115mureteral + rt punctate renal + Lt distal 2cm ureteral + Left renal >2cm stone. Cr >6. K wnl. Baseline Cr 1.0.  PMH sig for morbid obesity s/p R+Y 2004, sleep apnea / trach in 1980s. Denies ischemic heart disease.   Today [facility] seen in ER for above. Last mean > 8 hours ago.   Past Medical History  Diagnosis Date  . Kidney stones   . Hypertension   . Hypercholesteremia   . Sleep apnea   . Osteomyelitis     Past Surgical History  Procedure Laterality Date  . Lithotripsy    . Lithotripsy    . Percutaneous nephrostolithotomy      No family history on file. Social History:  reports that he quit smoking about 45 years ago. His smoking use included Cigarettes. He smoked 0.00 packs per day. He does not have any smokeless tobacco history on file. He reports that he does not drink alcohol or use illicit drugs.  Allergies: No Known Allergies   (Not in a hospital admission)  Results for orders placed during the hospital encounter of 06/18/13 (from the past 48 hour(s))  BASIC METABOLIC PANEL     Status: Abnormal   Collection Time    06/18/13 12:40 PM      Result Value Range   Sodium 140  137 - 147 mEq/L   Potassium 4.7  3.7 - 5.3 mEq/L   Chloride 100  96 - 112 mEq/L   CO2 22  19 - 32 mEq/L   Glucose, Bld 99  70 - 99 mg/dL   BUN 36 (*) 6 - 23 mg/dL   Creatinine, Ser 6.25 (*) 0.50 - 1.35 mg/dL   Calcium 8.8  8.4 - 10.5 mg/dL   GFR calc non Af Amer 8 (*) >90 mL/min   GFR calc Af Amer 10 (*) >90 mL/min   Comment: (NOTE)     The eGFR has been calculated using the CKD  EPI equation.     This calculation has not been validated in all clinical situations.     eGFR's persistently <90 mL/min signify possible Chronic Kidney     Disease.  CBC WITH DIFFERENTIAL     Status: Abnormal   Collection Time    06/18/13 12:40 PM      Result Value Range   WBC 6.3  4.0 - 10.5 K/uL   RBC 4.14 (*) 4.22 - 5.81 MIL/uL   Hemoglobin 12.9 (*) 13.0 - 17.0 g/dL   HCT 37.6 (*) 39.0 - 52.0 %   MCV 90.8  78.0 - 100.0 fL   MCH 31.2  26.0 - 34.0 pg   MCHC 34.3  30.0 - 36.0 g/dL   RDW 13.0  11.5 - 15.5 %   Platelets 207  150 - 400 K/uL   Neutrophils Relative % 61  43 - 77 %   Neutro Abs 3.8  1.7 - 7.7 K/uL   Lymphocytes Relative 25  12 - 46 %   Lymphs Abs 1.6  0.7 - 4.0 K/uL   Monocytes Relative 10  3 - 12 %   Monocytes Absolute 0.6  0.1 - 1.0 K/uL   Eosinophils Relative 4  0 - 5 %   Eosinophils Absolute 0.3  0.0 - 0.7 K/uL   Basophils Relative 1  0 - 1 %   Basophils Absolute 0.0  0.0 - 0.1 K/uL  URINALYSIS, ROUTINE W REFLEX MICROSCOPIC     Status: Abnormal   Collection Time    06/18/13  1:14 PM      Result Value Range   Color, Urine YELLOW  YELLOW   APPearance CLOUDY (*) CLEAR   Specific Gravity, Urine 1.011  1.005 - 1.030   pH 6.0  5.0 - 8.0   Glucose, UA NEGATIVE  NEGATIVE mg/dL   Hgb urine dipstick MODERATE (*) NEGATIVE   Bilirubin Urine NEGATIVE  NEGATIVE   Ketones, ur NEGATIVE  NEGATIVE mg/dL   Protein, ur 100 (*) NEGATIVE mg/dL   Urobilinogen, UA 0.2  0.0 - 1.0 mg/dL   Nitrite NEGATIVE  NEGATIVE   Leukocytes, UA MODERATE (*) NEGATIVE  URINE MICROSCOPIC-ADD ON     Status: Abnormal   Collection Time    06/18/13  1:14 PM      Result Value Range   WBC, UA 11-20  <3 WBC/hpf   RBC / HPF 11-20  <3 RBC/hpf   Bacteria, UA FEW (*) RARE   Crystals CA OXALATE CRYSTALS (*) NEGATIVE   Ct Abdomen Pelvis Wo Contrast  06/18/2013   CLINICAL DATA:  Urinary retention.  EXAM: CT ABDOMEN AND PELVIS WITHOUT CONTRAST  TECHNIQUE: Multidetector CT imaging of the abdomen and pelvis  was performed following the standard protocol without intravenous contrast.  COMPARISON:  CT UROGRAM dated 08/15/2009  FINDINGS: Lower Chest: Clear lung bases. Normal heart size without pericardial or pleural effusion. Right coronary artery atherosclerosis. A small hiatal hernia.  Abdomen/Pelvis: Normal infused appearance of the liver, spleen. Surgical changes within the proximal stomach. Fatty atrophy throughout the pancreas.  A small stone within a contracted gallbladder, unchanged. No pericholecystic edema or biliary ductal dilatation.  Normal adrenal glands.  Bilateral renal collecting system stones. Larger stone burden on the left. Extensive left-sided renal cortical thinning, especially in the lower pole. Moderate bilateral hydroureteronephrosis is increased since 08/15/2009. Multiple stones are identified within the left extrarenal pelvis. Distal left ureteric stones x2. The larger measures 8 x 8 mm on coronal image 75. Mid right ureteric stones measure conglomerate 1.3 cm on image 66 transverse. Just inferior to this is a 11 mm stone.  Multiple bladder calculi.  Aortic and branch vessel atherosclerosis. No retroperitoneal or retrocrural adenopathy. Rectal underdistention  Scattered colonic diverticula. Normal terminal ileum and appendix. There are surgical changes within the mid small bowel. No obstruction.  No pelvic adenopathy. Normal prostate, without significant free pelvic fluid.  Bones/Musculoskeletal: Mild chronic wedging of the superior endplate of L3.  IMPRESSION: 1. Moderate bilateral hydroureteronephrosis. Multiple large ureteric stones bilaterally. Multiple bladder calculi. 2. Nephrolithiasis.  Markedly scarred left kidney. 3. Cholelithiasis. 4. Coronary artery atherosclerosis.   Electronically Signed   By: Abigail Miyamoto M.D.   On: 06/18/2013 15:30    Review of Systems  Constitutional: Positive for malaise/fatigue. Negative for fever, chills and weight loss.  HENT: Negative.   Eyes: Negative.    Respiratory: Negative.   Cardiovascular: Negative.  Negative for chest pain, palpitations and orthopnea.  Gastrointestinal: Negative.  Negative for nausea and vomiting.  Genitourinary:       Anuria x 24 hours  Musculoskeletal: Negative.   Skin: Negative.  Neurological: Negative.   Endo/Heme/Allergies: Negative.   Psychiatric/Behavioral: Negative.     Blood pressure 142/61, pulse 75, temperature 98.1 F (36.7 C), temperature source Oral, resp. rate 16, SpO2 97.00%. Physical Exam  Constitutional: He appears well-developed and well-nourished.  Morbid obesity  Eyes: EOM are normal. Pupils are equal, round, and reactive to light.  Neck: Normal range of motion. Neck supple.  Cardiovascular: Normal rate and regular rhythm.   Respiratory: Effort normal.  GI: Soft. Bowel sounds are normal.  Genitourinary: Penis normal.  Musculoskeletal: Normal range of motion.  Neurological: He is alert.  Skin: Skin is warm and dry.  Psychiatric: He has a normal mood and affect. His behavior is normal. Judgment and thought content normal.     Assessment/Plan  1 - Bilateral Large Ureteral Stones, Acute Renal Failure - Needs urgent renal decompression. Discussed options of bilat neph tubes followed by staged surgery (likley need at least 3) v. Attempt bilatera stent with or withotu ureteroscopy / laser today in effort to decompress kidneys and make some progress on stones. I specifically warned him that he may still require neph tubes even in stents/ureteroscopy attempted given stone volume. He wants trial of stents / ureteroscopy. Risks including bleeding, infection, damage to kidney / ureter / bladder / loss of kidney, need for staged surgery discussed. Rare risks including DVT, PE, MI, CVA, also reinforced. Will need admit post-op with foley to monitor post-obstructive diuresis.    Climmie Buelow 06/18/2013, 4:52 PM

## 2013-06-18 NOTE — Brief Op Note (Signed)
06/18/2013  8:01 PM  PATIENT:  Dennis Spencer  68 y.o. male  PRE-OPERATIVE DIAGNOSIS:  renal failure, bilateral ureteral stones  POST-OPERATIVE DIAGNOSIS:  renal failure, bilateral ureteral stones  PROCEDURE:  Procedure(s): CYSTOSCOPY WITH RETROGRADE PYELOGRAM, Left URETEROSCOPY with laser , AND bilateral STENT PLACEMENT (Bilateral) HOLMIUM LASER APPLICATION (Bilateral)  SURGEON:  Surgeon(s) and Role:    * Alexis Frock, MD - Primary  PHYSICIAN ASSISTANT:   ASSISTANTS: none   ANESTHESIA:   general  EBL:     BLOOD ADMINISTERED:none  DRAINS: 85F foley to straight drain   LOCAL MEDICATIONS USED:  NONE  SPECIMEN:  Source of Specimen:  Left Ureteral and Bladder Stones  DISPOSITION OF SPECIMEN:  [facility] for compositional analysis  COUNTS:  YES  TOURNIQUET:  * No tourniquets in log *  DICTATION: .Other Dictation: Dictation Number [id number]  PLAN OF CARE: Admit to inpatient   PATIENT DISPOSITION:  PACU - hemodynamically stable.   Delay start of Pharmacological VTE agent (>24hrs) due to surgical blood loss or risk of bleeding: not applicable

## 2013-06-18 NOTE — Anesthesia Preprocedure Evaluation (Addendum)
Anesthesia Evaluation  Patient identified by MRN, date of birth, ID band Patient awake    Reviewed: Allergy & Precautions, H&P , NPO status , Patient's Chart, lab work & pertinent test results  Airway Mallampati: II TM Distance: >3 FB Neck ROM: Full    Dental no notable dental hx.    Pulmonary sleep apnea , former smoker,  breath sounds clear to auscultation  + decreased breath sounds      Cardiovascular hypertension, Pt. on medications Rhythm:Regular Rate:Normal     Neuro/Psych negative neurological ROS  negative psych ROS   GI/Hepatic negative GI ROS, Neg liver ROS,   Endo/Other  Morbid obesity  Renal/GU negative Renal ROS  negative genitourinary   Musculoskeletal negative musculoskeletal ROS (+)   Abdominal (+) + obese,   Peds negative pediatric ROS (+)  Hematology negative hematology ROS (+)   Anesthesia Other Findings   Reproductive/Obstetrics negative OB ROS                          Anesthesia Physical Anesthesia Plan  ASA: IV  Anesthesia Plan: General   Post-op Pain Management:    Induction: Intravenous  Airway Management Planned: Tracheostomy  Additional Equipment:   Intra-op Plan:   Post-operative Plan:   Informed Consent: I have reviewed the patients History and Physical, chart, labs and discussed the procedure including the risks, benefits and alternatives for the proposed anesthesia with the patient or authorized representative who has indicated his/her understanding and acceptance.   Dental advisory given  Plan Discussed with: CRNA and Surgeon  Anesthesia Plan Comments:         Anesthesia Quick Evaluation

## 2013-06-19 ENCOUNTER — Encounter (HOSPITAL_COMMUNITY): Payer: Self-pay

## 2013-06-19 LAB — BASIC METABOLIC PANEL
BUN: 42 mg/dL — ABNORMAL HIGH (ref 6–23)
CALCIUM: 8.2 mg/dL — AB (ref 8.4–10.5)
CO2: 21 meq/L (ref 19–32)
Chloride: 102 mEq/L (ref 96–112)
Creatinine, Ser: 6.47 mg/dL — ABNORMAL HIGH (ref 0.50–1.35)
GFR calc Af Amer: 9 mL/min — ABNORMAL LOW (ref >90)
GFR calc non Af Amer: 8 mL/min — ABNORMAL LOW (ref >90)
Glucose, Bld: 183 mg/dL — ABNORMAL HIGH (ref 70–99)
POTASSIUM: 5.5 meq/L — AB (ref 3.7–5.3)
SODIUM: 138 meq/L (ref 137–147)

## 2013-06-19 LAB — RENAL FUNCTION PANEL
Albumin: 2.9 g/dL — ABNORMAL LOW (ref 3.5–5.2)
BUN: 41 mg/dL — AB (ref 6–23)
CHLORIDE: 100 meq/L (ref 96–112)
CO2: 21 meq/L (ref 19–32)
Calcium: 7.9 mg/dL — ABNORMAL LOW (ref 8.4–10.5)
Creatinine, Ser: 6.21 mg/dL — ABNORMAL HIGH (ref 0.50–1.35)
GFR calc Af Amer: 10 mL/min — ABNORMAL LOW (ref >90)
GFR, EST NON AFRICAN AMERICAN: 8 mL/min — AB (ref >90)
Glucose, Bld: 146 mg/dL — ABNORMAL HIGH (ref 70–99)
Phosphorus: 5.2 mg/dL — ABNORMAL HIGH (ref 2.3–4.6)
Potassium: 4.8 mEq/L (ref 3.7–5.3)
Sodium: 136 mEq/L — ABNORMAL LOW (ref 137–147)

## 2013-06-19 LAB — GLUCOSE, CAPILLARY
GLUCOSE-CAPILLARY: 185 mg/dL — AB (ref 70–99)
GLUCOSE-CAPILLARY: 140 mg/dL — AB (ref 70–99)
Glucose-Capillary: 168 mg/dL — ABNORMAL HIGH (ref 70–99)
Glucose-Capillary: 120 mg/dL — ABNORMAL HIGH (ref 70–99)

## 2013-06-19 LAB — URINE CULTURE: Colony Count: 4000

## 2013-06-19 LAB — HEMOGLOBIN A1C
Hgb A1c MFr Bld: 5.9 % — ABNORMAL HIGH (ref <5.7)
Mean Plasma Glucose: 123 mg/dL — ABNORMAL HIGH (ref <117)

## 2013-06-19 MED ORDER — GABAPENTIN 300 MG PO CAPS
300.0000 mg | ORAL_CAPSULE | Freq: Every day | ORAL | Status: DC
  Administered 2013-06-19 – 2013-06-20 (×2): 300 mg via ORAL
  Filled 2013-06-19 (×3): qty 1

## 2013-06-19 MED ORDER — INSULIN ASPART 100 UNIT/ML ~~LOC~~ SOLN
0.0000 [IU] | Freq: Three times a day (TID) | SUBCUTANEOUS | Status: DC
  Administered 2013-06-19 (×2): 4 [IU] via SUBCUTANEOUS

## 2013-06-19 MED ORDER — INSULIN ASPART 100 UNIT/ML ~~LOC~~ SOLN: 0.0000 [IU] | Freq: Every day | SUBCUTANEOUS | Status: DC

## 2013-06-19 MED ORDER — INSULIN ASPART 100 UNIT/ML ~~LOC~~ SOLN
4.0000 [IU] | Freq: Three times a day (TID) | SUBCUTANEOUS | Status: DC
  Administered 2013-06-19 – 2013-06-20 (×5): 4 [IU] via SUBCUTANEOUS

## 2013-06-19 MED ORDER — INFLUENZA VAC SPLIT QUAD 0.5 ML IM SUSP
0.5000 mL | INTRAMUSCULAR | Status: AC
  Administered 2013-06-20: 0.5 mL via INTRAMUSCULAR
  Filled 2013-06-19 (×3): qty 0.5

## 2013-06-19 NOTE — Progress Notes (Signed)
1 Day Post-Op  Subjective:  1 - Bilateral Large Ureteral Stones - s/p emergent cysto, bilateral stents, left first stage ureteroscopic laser lithotripsy 06/18/13 for large bilateral ureteral stones and renal stones.   2 - Acute Renal Failure - Cr on admission 6 with baseline of 1.0 several most ago. K normal on admit but up to 5.5 2/8. Having vigorous diuresis as exppected. Nephrology consult pending.  Today Jonnatan is w/o complaints. Foley working well. Pain controlled.   Objective: Vital signs in last 24 hours: Temp:  [98 F (36.7 C)-98.7 F (37.1 C)] 98.4 F (36.9 C) (02/08 0611) Pulse Rate:  [51-95] 70 (02/08 0611) Resp:  [12-18] 18 (02/08 0611) BP: (115-160)/(49-65) 128/65 mmHg (02/08 0611) SpO2:  [93 %-100 %] 94 % (02/08 0611) Weight:  [133.811 kg (295 lb)] 133.811 kg (295 lb) (02/07 2200)    Intake/Output from previous day: 02/07 0701 - 02/08 0700 In: 1617.5 [I.V.:1617.5] Out: 2900 [Urine:2900] Intake/Output this shift:    General appearance: alert, cooperative, appears stated age and wife at bedside Head: Normocephalic, without obvious abnormality, atraumatic Eyes: conjunctivae/corneas clear. PERRL, EOM's intact. Fundi benign. Ears: normal TM's and external ear canals both ears Nose: Nares normal. Septum midline. Mucosa normal. No drainage or sinus tenderness. Throat: lips, mucosa, and tongue normal; teeth and gums normal and trach in place c/d/i. Neck: no adenopathy, no carotid bruit, no JVD, supple, symmetrical, trachea midline and thyroid not enlarged, symmetric, no tenderness/mass/nodules Back: symmetric, no curvature. ROM normal. No CVA tenderness. Resp: clear to auscultation bilaterally Chest wall: no tenderness Cardio: regular rate and rhythm, S1, S2 normal, no murmur, click, rub or gallop GI: soft, non-tender; bowel sounds normal; no masses,  no organomegaly and morbid truncal obesity, prior scars c/d/i w/o hernias.  Male genitalia: normal, Foley c/d/i with  vigorous clear urien output.  Extremities: extremities normal, atraumatic, no cyanosis or edema Pulses: 2+ and symmetric Skin: Skin color, texture, turgor normal. No rashes or lesions Lymph nodes: Cervical, supraclavicular, and axillary nodes normal. Neurologic: Grossly normal  Lab Results:   Recent Labs  06/18/13 1240  WBC 6.3  HGB 12.9*  HCT 37.6*  PLT 207   BMET  Recent Labs  06/18/13 1240 06/19/13 0540  NA 140 138  K 4.7 5.5*  CL 100 102  CO2 22 21  GLUCOSE 99 183*  BUN 36* 42*  CREATININE 6.25* 6.47*  CALCIUM 8.8 8.2*   PT/INR No results found for this basename: LABPROT, INR,  in the last 72 hours ABG No results found for this basename: PHART, PCO2, PO2, HCO3,  in the last 72 hours  Studies/Results: Ct Abdomen Pelvis Wo Contrast  06/18/2013   CLINICAL DATA:  Urinary retention.  EXAM: CT ABDOMEN AND PELVIS WITHOUT CONTRAST  TECHNIQUE: Multidetector CT imaging of the abdomen and pelvis was performed following the standard protocol without intravenous contrast.  COMPARISON:  CT UROGRAM dated 08/15/2009  FINDINGS: Lower Chest: Clear lung bases. Normal heart size without pericardial or pleural effusion. Right coronary artery atherosclerosis. A small hiatal hernia.  Abdomen/Pelvis: Normal infused appearance of the liver, spleen. Surgical changes within the proximal stomach. Fatty atrophy throughout the pancreas.  A small stone within a contracted gallbladder, unchanged. No pericholecystic edema or biliary ductal dilatation.  Normal adrenal glands.  Bilateral renal collecting system stones. Larger stone burden on the left. Extensive left-sided renal cortical thinning, especially in the lower pole. Moderate bilateral hydroureteronephrosis is increased since 08/15/2009. Multiple stones are identified within the left extrarenal pelvis. Distal left ureteric stones  x2. The larger measures 8 x 8 mm on coronal image 75. Mid right ureteric stones measure conglomerate 1.3 cm on image 66  transverse. Just inferior to this is a 11 mm stone.  Multiple bladder calculi.  Aortic and branch vessel atherosclerosis. No retroperitoneal or retrocrural adenopathy. Rectal underdistention  Scattered colonic diverticula. Normal terminal ileum and appendix. There are surgical changes within the mid small bowel. No obstruction.  No pelvic adenopathy. Normal prostate, without significant free pelvic fluid.  Bones/Musculoskeletal: Mild chronic wedging of the superior endplate of L3.  IMPRESSION: 1. Moderate bilateral hydroureteronephrosis. Multiple large ureteric stones bilaterally. Multiple bladder calculi. 2. Nephrolithiasis.  Markedly scarred left kidney. 3. Cholelithiasis. 4. Coronary artery atherosclerosis.   Electronically Signed   By: Abigail Miyamoto M.D.   On: 06/18/2013 15:30    Anti-infectives: Anti-infectives   Start     Dose/Rate Route Frequency Ordered Stop   06/18/13 1500  cefTRIAXone (ROCEPHIN) 1 g in dextrose 5 % 50 mL IVPB     1 g 100 mL/hr over 30 Minutes Intravenous  Once 06/18/13 1448 06/18/13 1657      Assessment/Plan:  1 - Bilateral Large Ureteral Stones - s/p first of likely 3 stage surgery. Will need left PCNL, Rt URS staged in several weeks after GFR recovery. Also discussed role of metabolic eval as he has extremely high risk stone disease.   2 - Acute Renal Failure - post-obstructive diuresis as expected. Continue IVF at 150cc/hr. Nephrology consult as some mild hyperkalemia now. Begin insulin SSI to help with K. Recheck afternoon renal panel. Overall fairly good prognosis for GFR recover as likely fairly acute renal failure.   3 - Disposition - remain in house. Will likely be in for several days until post-obstructive diuresis resolves and electrolyte abnormalities normalize.   [facility], [place] Devin 06/19/2013

## 2013-06-19 NOTE — Op Note (Signed)
NAMEHILLERY, BHALLA NO.:  [id number]  MEDICAL RECORD NO.:  [id number]  LOCATION:  7035                         FACILITY:  [facility]  PHYSICIAN:  Alexis Frock, MD     DATE OF BIRTH:  [birth year]  DATE OF PROCEDURE:  06/18/2013 DATE OF DISCHARGE:                              OPERATIVE REPORT   DIAGNOSES:  Bilateral large volume ureteral stones, bilateral renal stones, and acute renal failure with anuria.  PROCEDURE: 1. Cystoscopy with bilateral retrograde pyelogram interpretation. 2. Insertion of bilateral ureteral stents 6 x 26, no tether. 3. Left ureteroscopy with laser lithotripsy. 4. Cystolitholapaxy.  FINDINGS: 1. Multifocal bladder stones consistent with likely prior ureteral     stones. 2. Bilateral massive hydroureteronephrosis with bilateral distal large     volume ureteral stones. 3. Clearance of left distal ureteral stone with ureteroscopy and laser     lithotripsy.  INDICATION:  Dennis Spencer is a pleasant 68 year old gentleman with history of morbid obesity, status post Roux-en-Y gastric bypass as well as large volume recurrent nephrolithiasis, status post multiple prior procedures including ureteroscopy, shockwave lithotripsy, and percutaneous stone surgery.  His most recent urologic evaluation was greater than 5 years ago.  Per the patient, he presented to the [facility] this afternoon with approximately 24 hours of anuria.  The CT imaging corroborated large volume bilateral ureteral stones with large hydronephrosis consistent with acute obstruction.  He also had a rise in his creatinine from baseline of 1-6.  His potassium was normal.  Options were discussed including urgent bilateral nephrostomy tube versus urgent operative intervention with bilateral stenting with and without beginning the process of getting him stone free, which would likely require multiple stages and he was received with the latter.  Informed consent was  obtained and placed in medical record.  PROCEDURE IN DETAIL:  [name] verified. Procedure being cystoscopy, bilateral retrograde pyelograms, bilateral stenting, cystolitholapaxy, and ureteroscopic stone manipulation was confirmed.  Procedure was carried out.  Time-out was performed. Intravenous antibiotics were administered.  General endotracheal anesthesia was introduced via the patient's tracheostomy site.  Sterile field was created by prepping and draping the patient's penis, perineum, proximal thighs using iodine x3.  Next, cystourethroscopy was performed using 22-French rigid cystoscope with 30-degree offset lens.  The patient's anterior and posterior urethra unremarkable.  Inspection of urinary bladder revealed multifocal bladder stones.  These were relatively small, but too large to be simply irrigated through the 22- French cystoscope sheath and the appearance of possible prior ureteral stones.  Attention was then directed to the placement of right ureteral stent.  The right ureteral orifice was cannulated with a 6-French Foley catheter and right retrograde pyelogram seen.  Right retrograde pyelogram demonstrates a single right ureter and single system left kidney.  There were multiple filling defects in distal ureter consistent with known stone.  There was a massive hydroureteronephrosis above this.  A 0.038 Glidewire was then very carefully navigated up to the level of the upper pole over which a new 6 x 26 double-J stent was placed.  Good proximal and distal curl were noted.  There was copious efflux of clear-appearing urine around and through the  distal end of the stent.  Attention was then directed to the left side.  The left ureteral orifice was then cannulated with a 6- French end-hole catheter and left retrograde pyelogram was obtained.  Left retrograde pyelogram demonstrates a single left ureter and single system left kidney.  There was   massive hydroureteronephrosis to the level of the multifocal distal filling defects consistent with known stone.  As we had decompressed the patient's dominant appearing kidney and his goal was to make progress of the stone removal, we set out to begin treating his left side with left ureteroscopic stone manipulation. At this point, a 0.038 Glidewire was advanced at the level of the upper pole and set aside as a safety wire.  Next, semi-rigid ureteroscopy was performed of the distal left ureter alongside a separate Sensor working wire.  Multifocal large volume stones were encountered.  Holmium laser lithotripsy was carefully applied.  The stones predominantly using a dusting technique at settings of 0.6 joules and 6 hertz fragmenting approximately a 50% of the stone volume using this technique.  Having dusted the outer edges of all these distal stones, they appeared to be of better caliber for basketing as an Escape basket was used to grasp these stones and they removed and set aside in the level of the urinary bladder.  Semi-rigid ureteroscopy was then performed of the distal 2/3rd of left ureter and no mucosal abnormalities.  Additional calcifications were found until we that had successfully cleared the left distal ureter.  Next, a new 6 x 26 double-J stent was placed over the left safety wire and good proximal and distal curl were noted.  Vigorous efflux of urine was seen around into the distal end of the stent. Attention was then directed at cystolitholapaxy.  Using the stent pusher as a laser stabilizer, the 200 nanometer laser fiber was used to break up these bladder fragments using settings of 0.6 joules and 20 hertz fragmenting the stones into pieces approximately 4 mm or less, and these were then easily irrigated via the cystoscope sheath completely and set aside.  Following these maneuvers, the bladder was once again inspected. Complete stone removal was seen.  There was no  evidence of bladder perforation.  Hemostasis appeared excellent.  As the patient had been anuric and there was some anticipation of postobstructive diuresis, was felt that catheter drainage was warranted as such, an new 18-French coude-tip catheter was placed to level of the urinary bladder 10 mL sterile water in the balloon.  This connected to straight drain. Procedure was then terminated.  Notably, during all ureteroscopic portions of the procedure, an 8-French feeding tube was in the urinary bladder for pressure release.  The patient was taken to postanesthesia care unit in stable condition.          ______________________________ Alexis Frock, MD     TM/MEDQ  D:  06/18/2013  T:  06/19/2013  Job:  [id number]

## 2013-06-19 NOTE — Consult Note (Signed)
Referring Provider: No ref. provider found Primary Care Physician:  Redge Gainer, MD Primary Nephrologist:    Reason for Consultation:  Acute kidney injury and nephrolithiasis with obstruction and hyperkalemia HPI: Bilateral Large Ureteral Stones, Acute Renal Failure - Long h/o nephrolithiasis with prior ureteroscopy, PCNL, lithotripsy x many. No GU eval in > 5 years. Presents to ER today with >24 hour anuria and noted to have very large volume bilateral ureteral and renal stones including Rt 32mm and 45mm ureteral + rt punctate renal + Lt distal 2cm ureteral + Left renal >2cm stone. Cr >6.  Baseline Cr 1.0. Potassium increased to 5.5 although marked improvement of renal function./p emergent cysto, bilateral stents, left first stage ureteroscopic laser lithotripsy 06/18/13 for large bilateral ureteral stones and renal stones.status post emergent cysto, bilateral stents, left first stage ureteroscopic laser lithotripsy 06/18/13 for large bilateral ureteral stones and renal stones.      Past Medical History  Diagnosis Date  . Kidney stones   . Hypertension   . Hypercholesteremia   . Sleep apnea   . Osteomyelitis     Past Surgical History  Procedure Laterality Date  . Lithotripsy    . Lithotripsy    . Percutaneous nephrostolithotomy      Prior to Admission medications   Medication Sig Start Date End Date Taking? Authorizing Provider  amLODipine (NORVASC) 5 MG tablet Take 1 tablet (5 mg total) by mouth daily. 11/10/12  Yes Shanda Howells, MD  aspirin 81 MG chewable tablet Chew 81 mg by mouth daily.   Yes Historical Provider, MD  atorvastatin (LIPITOR) 40 MG tablet TAKE 1 TABLET (40 MG TOTAL) BY MOUTH DAILY. 06/15/13  Yes Shanda Howells, MD  calcium citrate (CALCITRATE - DOSED IN MG ELEMENTAL CALCIUM) 950 MG tablet Take 200 mg of elemental calcium by mouth daily.   Yes Historical Provider, MD  cholecalciferol (VITAMIN D) 1000 UNITS tablet Take 2,000 Units by mouth 2 (two) times daily.   Yes  Historical Provider, MD  gabapentin (NEURONTIN) 600 MG tablet Take 300 mg by mouth at bedtime.   Yes Historical Provider, MD  ketorolac (TORADOL) 10 MG tablet Take 10 mg by mouth every 6 (six) hours as needed (pain).   Yes Historical Provider, MD  Multiple Vitamins-Minerals (MULTIVITAMIN WITH MINERALS) tablet Take 1 tablet by mouth daily.   Yes Historical Provider, MD  omeprazole (PRILOSEC) 20 MG capsule Take 20 mg by mouth daily.   Yes Historical Provider, MD  vitamin B-12 (CYANOCOBALAMIN) 1000 MCG tablet Take 1,000 mcg by mouth daily.   Yes Historical Provider, MD  vitamin C (ASCORBIC ACID) 500 MG tablet Take 500 mg by mouth 2 (two) times daily.   Yes Historical Provider, MD    Current Facility-Administered Medications  Medication Dose Route Frequency Provider Last Rate Last Dose  . 0.9 %  sodium chloride infusion   Intravenous Continuous Alexis Frock, MD 150 mL/hr at 06/19/13 [phone]    . amLODipine (NORVASC) tablet 5 mg  5 mg Oral Daily Alexis Frock, MD   5 mg at 06/19/13 1043  . aspirin chewable tablet 81 mg  81 mg Oral Daily Alexis Frock, MD   81 mg at 06/19/13 1043  . atorvastatin (LIPITOR) tablet 40 mg  40 mg Oral q1800 Alexis Frock, MD      . calcium citrate (CALCITRATE - dosed in mg elemental calcium) tablet 200 mg of elemental calcium  200 mg of elemental calcium Oral Daily Alexis Frock, MD   200 mg of elemental calcium at 06/19/13  1043  . docusate sodium (COLACE) capsule 100 mg  100 mg Oral BID Alexis Frock, MD   100 mg at 06/19/13 1043  . HYDROmorphone (DILAUDID) injection 0.5-1 mg  0.5-1 mg Intravenous Q2H PRN Alexis Frock, MD      . Derrill Memo ON 06/20/2013] influenza vac split quadrivalent PF (FLUARIX) injection 0.5 mL  0.5 mL Intramuscular Tomorrow-1000 Alexis Frock, MD      . insulin aspart (novoLOG) injection 0-20 Units  0-20 Units Subcutaneous TID WC Alexis Frock, MD   4 Units at 06/19/13 0848  . insulin aspart (novoLOG) injection 0-5 Units  0-5 Units Subcutaneous QHS  Alexis Frock, MD      . insulin aspart (novoLOG) injection 4 Units  4 Units Subcutaneous TID WC Alexis Frock, MD   4 Units at 06/19/13 [phone]  . ondansetron (ZOFRAN) injection 4 mg  4 mg Intravenous Q4H PRN Alexis Frock, MD      . oxybutynin (DITROPAN) tablet 5 mg  5 mg Oral Q8H PRN Alexis Frock, MD      . oxyCODONE-acetaminophen (PERCOCET/ROXICET) 5-325 MG per tablet 1-2 tablet  1-2 tablet Oral Q4H PRN Alexis Frock, MD   1 tablet at 06/19/13 0053  . pantoprazole (PROTONIX) EC tablet 40 mg  40 mg Oral Daily Alexis Frock, MD   40 mg at 06/19/13 1043  . senna (SENOKOT) tablet 8.6 mg  1 tablet Oral BID Alexis Frock, MD   8.6 mg at 06/19/13 1043    Allergies as of 06/18/2013  . (No Known Allergies)    History reviewed. No pertinent family history.  History   Social History  . Marital Status: Married    Spouse Name: N/A    Number of Children: N/A  . Years of Education: N/A   Occupational History  . Not on file.   Social History Main Topics  . Smoking status: Former Smoker    Types: Cigarettes    Quit date: 05/12/1968  . Smokeless tobacco: Not on file  . Alcohol Use: No  . Drug Use: No  . Sexual Activity: Not on file   Other Topics Concern  . Not on file   Social History Narrative  . No narrative on file    Review of Systems: Gen: Denies any fever, chills, sweats, anorexia, fatigue, weakness, malaise, weight loss, and sleep disorder HEENT: No visual complaints, No history of Retinopathy. Normal external appearance No Epistaxis or Sore throat. No sinusitis.   CV: Denies chest pain, angina, palpitations, syncope, orthopnea, PND, peripheral edema, and claudication. Resp: Denies dyspnea at rest, dyspnea with exercise, cough, sputum, wheezing, coughing up blood, and pleurisy. GI: Denies vomiting blood, jaundice, and fecal incontinence.   Denies dysphagia or odynophagia. GU : Denies urinary burning, blood in urine, urinary frequency, urinary hesitancy, nocturnal  urination, and urinary incontinence.  No renal calculi. MS: Denies joint pain, limitation of movement, and swelling, stiffness, low back pain, extremity pain. Denies muscle weakness, cramps, atrophy.  No use of non steroidal antiinflammatory drugs. Derm: Denies rash, itching, dry skin, hives, moles, warts, or unhealing ulcers.  Psych: Denies depression, anxiety, memory loss, suicidal ideation, hallucinations, paranoia, and confusion. Heme: Denies bruising, bleeding, and enlarged lymph nodes. Neuro: No headache.  No diplopia. No dysarthria.  No dysphasia.  No history of CVA.  No Seizures. No paresthesias.  No weakness. Endocrine No DM.  No Thyroid disease.  No Adrenal disease.  Physical Exam: Vital signs in last 24 hours: Temp:  [98 F (36.7 C)-98.7 F (37.1 C)] 98.4 F (  36.9 C) (02/08 0611) Pulse Rate:  [51-95] 70 (02/08 0611) Resp:  [12-18] 18 (02/08 0611) BP: (115-160)/(49-65) 128/65 mmHg (02/08 0611) SpO2:  [93 %-100 %] 94 % (02/08 0611) Weight:  [133.811 kg (295 lb)] 133.811 kg (295 lb) (02/07 2200) Last BM Date: 06/18/13 General:   Alert,  Well-developed, well-nourished, pleasant and cooperative in NAD Head:  Normocephalic and atraumatic. Eyes:  Sclera clear, no icterus.   Conjunctiva pink. Ears:  Normal auditory acuity. Nose:  No deformity, discharge,  or lesions. Mouth:  No deformity or lesions, dentition normal. Neck:  Supple; no masses or thyromegaly. JVP not elevated Lungs:  Clear throughout to auscultation.   No wheezes, crackles, or rhonchi. No acute distress. Heart:  Regular rate and rhythm; no murmurs, clicks, rubs,  or gallops. Abdomen:  Soft, nontender and nondistended. No masses, hepatosplenomegaly or hernias noted. Normal bowel sounds, without guarding, and without rebound.   Msk:  Symmetrical without gross deformities. Normal posture. Pulses:  No carotid, renal, femoral bruits. DP and PT symmetrical and equal Extremities:  Without clubbing or edema. Neurologic:   Alert and  oriented x4;  grossly normal neurologically. Skin:  Intact without significant lesions or rashes. Cervical Nodes:  No significant cervical adenopathy. Psych:  Alert and cooperative. Normal mood and affect.  Intake/Output from previous day: 02/07 0701 - 02/08 0700 In: 1617.5 [I.V.:1617.5] Out: 2900 [Urine:2900] Intake/Output this shift: Total I/O In: -  Out: 900 [Urine:900]  Lab Results:  Recent Labs  06/18/13 1240  WBC 6.3  HGB 12.9*  HCT 37.6*  PLT 207   BMET  Recent Labs  06/18/13 1240 06/19/13 0540  NA 140 138  K 4.7 5.5*  CL 100 102  CO2 22 21  GLUCOSE 99 183*  BUN 36* 42*  CREATININE 6.25* 6.47*  CALCIUM 8.8 8.2*   LFT No results found for this basename: PROT, ALBUMIN, AST, ALT, ALKPHOS, BILITOT, BILIDIR, IBILI,  in the last 72 hours PT/INR No results found for this basename: LABPROT, INR,  in the last 72 hours Hepatitis Panel No results found for this basename: HEPBSAG, HCVAB, HEPAIGM, HEPBIGM,  in the last 72 hours  Studies/Results: Ct Abdomen Pelvis Wo Contrast  06/18/2013   CLINICAL DATA:  Urinary retention.  EXAM: CT ABDOMEN AND PELVIS WITHOUT CONTRAST  TECHNIQUE: Multidetector CT imaging of the abdomen and pelvis was performed following the standard protocol without intravenous contrast.  COMPARISON:  CT UROGRAM dated 08/15/2009  FINDINGS: Lower Chest: Clear lung bases. Normal heart size without pericardial or pleural effusion. Right coronary artery atherosclerosis. A small hiatal hernia.  Abdomen/Pelvis: Normal infused appearance of the liver, spleen. Surgical changes within the proximal stomach. Fatty atrophy throughout the pancreas.  A small stone within a contracted gallbladder, unchanged. No pericholecystic edema or biliary ductal dilatation.  Normal adrenal glands.  Bilateral renal collecting system stones. Larger stone burden on the left. Extensive left-sided renal cortical thinning, especially in the lower pole. Moderate bilateral  hydroureteronephrosis is increased since 08/15/2009. Multiple stones are identified within the left extrarenal pelvis. Distal left ureteric stones x2. The larger measures 8 x 8 mm on coronal image 75. Mid right ureteric stones measure conglomerate 1.3 cm on image 66 transverse. Just inferior to this is a 11 mm stone.  Multiple bladder calculi.  Aortic and branch vessel atherosclerosis. No retroperitoneal or retrocrural adenopathy. Rectal underdistention  Scattered colonic diverticula. Normal terminal ileum and appendix. There are surgical changes within the mid small bowel. No obstruction.  No pelvic adenopathy. Normal prostate, without  significant free pelvic fluid.  Bones/Musculoskeletal: Mild chronic wedging of the superior endplate of L3.  IMPRESSION: 1. Moderate bilateral hydroureteronephrosis. Multiple large ureteric stones bilaterally. Multiple bladder calculi. 2. Nephrolithiasis.  Markedly scarred left kidney. 3. Cholelithiasis. 4. Coronary artery atherosclerosis.   Electronically Signed   By: Abigail Miyamoto M.D.   On: 06/18/2013 15:30    Assessment/Plan:  Acute kidney injury following bilateral hydronephrosis and impacted stones. Urine output is now good with resolution of the obstruction. Will look towards recovery  HTN controlled    Anemia stable  Hyperkalemia should resolve with increased urine output   Unfortunate gentleman with baseline creatine of  1  Now with acuter renal failure and an increase in his creatinine, this should improve with hydration and relief of obstruction    LOS: 1 Jessen Siegman W @TODAY @12 :10 PM

## 2013-06-20 ENCOUNTER — Encounter (HOSPITAL_COMMUNITY): Payer: Self-pay | Admitting: Urology

## 2013-06-20 LAB — RENAL FUNCTION PANEL
Albumin: 2.8 g/dL — ABNORMAL LOW (ref 3.5–5.2)
BUN: 42 mg/dL — ABNORMAL HIGH (ref 6–23)
CALCIUM: 7.7 mg/dL — AB (ref 8.4–10.5)
CHLORIDE: 102 meq/L (ref 96–112)
CO2: 21 meq/L (ref 19–32)
Creatinine, Ser: 5.65 mg/dL — ABNORMAL HIGH (ref 0.50–1.35)
GFR, EST AFRICAN AMERICAN: 11 mL/min — AB (ref >90)
GFR, EST NON AFRICAN AMERICAN: 9 mL/min — AB (ref >90)
Glucose, Bld: 114 mg/dL — ABNORMAL HIGH (ref 70–99)
Phosphorus: 6.5 mg/dL — ABNORMAL HIGH (ref 2.3–4.6)
Potassium: 4.6 mEq/L (ref 3.7–5.3)
SODIUM: 137 meq/L (ref 137–147)

## 2013-06-20 MED ORDER — SODIUM CHLORIDE 0.9 % IV SOLN
INTRAVENOUS | Status: DC
  Administered 2013-06-20: 1000 mL via INTRAVENOUS
  Administered 2013-06-21: 03:00:00 via INTRAVENOUS

## 2013-06-20 NOTE — Progress Notes (Signed)
Patient prefers to do own trach care as at home on nightly basis.

## 2013-06-20 NOTE — Progress Notes (Signed)
2 Days Post-Op  Subjective:  1 - Bilateral Large Ureteral Stones - s/p emergent cysto, bilateral stents, left first stage ureteroscopic laser lithotripsy 06/18/13 for large bilateral ureteral stones and renal stones.   2 - Acute Renal Failure - Cr on admission 6 with baseline of 1.0 several most ago. K normal on admit but up to 5.5 2/8, now normalized with diuresis. Nephrology also following.  Today Dennis Spencer is w/o complaints. Pain controlled.   Objective: Vital signs in last 24 hours: Temp:  [97.6 F (36.4 C)-98.1 F (36.7 C)] 97.8 F (36.6 C) (02/09 0527) Pulse Rate:  [51-65] 51 (02/09 0527) Resp:  [18] 18 (02/09 0527) BP: (116-134)/(57-66) 116/57 mmHg (02/09 0527) SpO2:  [96 %-100 %] 96 % (02/09 0527) Last BM Date: 06/18/13  Intake/Output from previous day: 02/08 0701 - 02/09 0700 In: 4052.5 [P.O.:720; I.V.:3332.5] Out: 4450 [Urine:4450] Intake/Output this shift:    General appearance: alert, cooperative and appears stated age Head: Normocephalic, without obvious abnormality, atraumatic Eyes: conjunctivae/corneas clear. PERRL, EOM's intact. Fundi benign. Ears: normal TM's and external ear canals both ears Nose: Nares normal. Septum midline. Mucosa normal. No drainage or sinus tenderness. Throat: lips, mucosa, and tongue normal; teeth and gums normal and trach c/d/i Neck: no adenopathy, no carotid bruit, no JVD, supple, symmetrical, trachea midline and thyroid not enlarged, symmetric, no tenderness/mass/nodules Back: symmetric, no curvature. ROM normal. No CVA tenderness. Resp: clear to auscultation bilaterally Chest wall: no tenderness Cardio: regular rate and rhythm, S1, S2 normal, no murmur, click, rub or gallop GI: soft, non-tender; bowel sounds normal; no masses,  no organomegaly and obese, old scars w/o hernias Male genitalia: normal Extremities: extremities normal, atraumatic, no cyanosis or edema Pulses: 2+ and symmetric Skin: Skin color, texture, turgor normal. No  rashes or lesions Lymph nodes: Cervical, supraclavicular, and axillary nodes normal. Neurologic: Grossly normal  Lab Results:   Recent Labs  06/18/13 1240  WBC 6.3  HGB 12.9*  HCT 37.6*  PLT 207   BMET  Recent Labs  06/19/13 1245 06/20/13 0353  NA 136* 137  K 4.8 4.6  CL 100 102  CO2 21 21  GLUCOSE 146* 114*  BUN 41* 42*  CREATININE 6.21* 5.65*  CALCIUM 7.9* 7.7*   PT/INR No results found for this basename: LABPROT, INR,  in the last 72 hours ABG No results found for this basename: PHART, PCO2, PO2, HCO3,  in the last 72 hours  Studies/Results: Ct Abdomen Pelvis Wo Contrast  06/18/2013   CLINICAL DATA:  Urinary retention.  EXAM: CT ABDOMEN AND PELVIS WITHOUT CONTRAST  TECHNIQUE: Multidetector CT imaging of the abdomen and pelvis was performed following the standard protocol without intravenous contrast.  COMPARISON:  CT UROGRAM dated 08/15/2009  FINDINGS: Lower Chest: Clear lung bases. Normal heart size without pericardial or pleural effusion. Right coronary artery atherosclerosis. A small hiatal hernia.  Abdomen/Pelvis: Normal infused appearance of the liver, spleen. Surgical changes within the proximal stomach. Fatty atrophy throughout the pancreas.  A small stone within a contracted gallbladder, unchanged. No pericholecystic edema or biliary ductal dilatation.  Normal adrenal glands.  Bilateral renal collecting system stones. Larger stone burden on the left. Extensive left-sided renal cortical thinning, especially in the lower pole. Moderate bilateral hydroureteronephrosis is increased since 08/15/2009. Multiple stones are identified within the left extrarenal pelvis. Distal left ureteric stones x2. The larger measures 8 x 8 mm on coronal image 75. Mid right ureteric stones measure conglomerate 1.3 cm on image 66 transverse. Just inferior to this is a  11 mm stone.  Multiple bladder calculi.  Aortic and branch vessel atherosclerosis. No retroperitoneal or retrocrural adenopathy.  Rectal underdistention  Scattered colonic diverticula. Normal terminal ileum and appendix. There are surgical changes within the mid small bowel. No obstruction.  No pelvic adenopathy. Normal prostate, without significant free pelvic fluid.  Bones/Musculoskeletal: Mild chronic wedging of the superior endplate of L3.  IMPRESSION: 1. Moderate bilateral hydroureteronephrosis. Multiple large ureteric stones bilaterally. Multiple bladder calculi. 2. Nephrolithiasis.  Markedly scarred left kidney. 3. Cholelithiasis. 4. Coronary artery atherosclerosis.   Electronically Signed   By: Abigail Miyamoto M.D.   On: 06/18/2013 15:30    Anti-infectives: Anti-infectives   Start     Dose/Rate Route Frequency Ordered Stop   06/18/13 1500  cefTRIAXone (ROCEPHIN) 1 g in dextrose 5 % 50 mL IVPB     1 g 100 mL/hr over 30 Minutes Intravenous  Once 06/18/13 1448 06/18/13 1657      Assessment/Plan:   1 - Bilateral Large Ureteral Stones - s/p first of likely 3 stage surgery. Will need left PCNL, Rt URS staged in several weeks after GFR recovery. Also discussed role of metabolic eval as he has extremely high risk stone disease.   2 - Acute Renal Failure - post-obstructive diuresis as expected, now resolving. Saline lock IV, DC Foley, but maintain strict I+O. Will need help in counseling pt and adjusting meds for DC from nephrology in terms of his significantly decreased GFR at this point.   3 - Disposition - remain in house. Possible DC as soon as tomorrow as long as electrolytes remain in safe range.   [facility], Jerric Oyen 06/20/2013

## 2013-06-20 NOTE — Care Management Note (Signed)
    Page 1 of 1   06/20/2013     1:56:01 PM   CARE MANAGEMENT NOTE 06/20/2013  Patient:  Dennis Spencer, Dennis Spencer   Account Number:  [id number]  Date Initiated:  06/20/2013  Documentation initiated by:  Dessa Phi  Subjective/Objective Assessment:   68 Y/O M ADMITTED W/AKI, BILAT URETERAL STONES.     Action/Plan:   FROM HOME.HAS PCP,PHARMACY.   Anticipated DC Date:  06/23/2013   Anticipated DC Plan:  [place]  CM consult      Choice offered to / List presented to:             Status of service:  In process, will continue to follow Medicare Important Message given?   (If response is "NO", the following Medicare IM given date fields will be blank) Date Medicare IM given:   Date Additional Medicare IM given:    Discharge Disposition:    Per UR Regulation:  Reviewed for med. necessity/level of care/duration of stay  If discussed at Long Length of Stay Meetings, dates discussed:    Comments:  06/20/13 Zilla Shartzer RN,BSN NCM [phone] NO ANTICIPATED D/C NEEDS.

## 2013-06-20 NOTE — Progress Notes (Signed)
Nutrition Education Note  RD consulted for Renal Education by RN. Reviewed food groups and provided written recommended serving sizes specifically determined for patient's current nutritional status.   Explained why diet restrictions are needed and provided lists of foods to limit/avoid that are high potassium, sodium, and phosphorus. Provided specific recommendations on safer alternatives of these foods. Strongly encouraged compliance of this diet.   Pt with current ARF, has baseline Crt of 1.0.  Current Academy of Nutrition and Dietetics recommendations for ARF include 1-1.3 gram protein/kg. Nephrology noted pt's increase in Crt should improve with hydration and relief of obstruction. Relayed to pt's and pt's wife that if ARF progresses to chronic kidney disease, then protein restriction would be appropriate to implement.  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 Encouraged intake of low potassium fruit and vegetables, recommended calcium and fluid intake. Phos elevated, recommended minimal intake to one serving daily. Teach back method used  Expect good compliance. Pt and pt's wife very interested in appropriate diet to follow up d/c  Body mass index is 40 kg/(m^2). Pt meets criteria for Obese III based on current BMI.  Current diet order is Renal diet, patient is consuming approximately 50% of meals at this time. Labs and medications reviewed. No further nutrition interventions warranted at this time. RD contact information provided. If additional nutrition issues arise, please re-consult RD.   Atlee Abide MS RD LDN Clinical Dietitian [phone]

## 2013-06-20 NOTE — Progress Notes (Signed)
  [facility] Progress Note   Subjective: UOP 4.45 L yesterday, 1800 cc today so far. Creat down to 5.65 today (6.4 yesterday). Good uop last two days since   Exam  Blood pressure 142/58, pulse 67, temperature 97.1 F (36.2 C), temperature source Oral, resp. rate 19, height 6' (1.829 m), weight 133.811 kg (295 lb), SpO2 98.00%. Adult male up in chair, no distress Chest is clear No jvd, trach in place RRR no rub or M Abd obese, soft, NT Mild ankle edema bilat Neuro is alert, nf, ox3   Assessment: 1 Acute renal failure from bilat ureteral obstruction 2 Hyperkalemia, resolved 3 Nephorureterolithiasis, s/p ureteroscopy, L-sided first stage laser lithotripsy and bilat internal stent placement   Plan- Resume IVF NS at 75/hr, daily creatinine, strict I/O's, daily wts    Kelly Splinter MD  pager [phone]    cell [phone]  06/20/2013, 3:22 PM     Recent Labs Lab 06/19/13 0540 06/19/13 1245 06/20/13 0353  NA 138 136* 137  K 5.5* 4.8 4.6  CL 102 100 102  CO2 21 21 21   GLUCOSE 183* 146* 114*  BUN 42* 41* 42*  CREATININE 6.47* 6.21* 5.65*  CALCIUM 8.2* 7.9* 7.7*  PHOS  --  5.2* 6.5*    Recent Labs Lab 06/19/13 1245 06/20/13 0353  ALBUMIN 2.9* 2.8*    Recent Labs Lab 06/18/13 1240  WBC 6.3  NEUTROABS 3.8  HGB 12.9*  HCT 37.6*  MCV 90.8  PLT 207   . amLODipine  5 mg Oral Daily  . aspirin  81 mg Oral Daily  . atorvastatin  40 mg Oral q1800  . calcium citrate  200 mg of elemental calcium Oral Daily  . docusate sodium  100 mg Oral BID  . gabapentin  300 mg Oral QHS  . insulin aspart  0-20 Units Subcutaneous TID WC  . insulin aspart  0-5 Units Subcutaneous QHS  . insulin aspart  4 Units Subcutaneous TID WC  . pantoprazole  40 mg Oral Daily  . senna  1 tablet Oral BID     HYDROmorphone (DILAUDID) injection, ondansetron, oxybutynin, oxyCODONE-acetaminophen

## 2013-06-21 LAB — GLUCOSE, CAPILLARY
GLUCOSE-CAPILLARY: 93 mg/dL (ref 70–99)
Glucose-Capillary: 98 mg/dL (ref 70–99)
Glucose-Capillary: 56 mg/dL — ABNORMAL LOW (ref 70–99)
Glucose-Capillary: 193 mg/dL — ABNORMAL HIGH (ref 70–99)
Glucose-Capillary: 116 mg/dL — ABNORMAL HIGH (ref 70–99)

## 2013-06-21 LAB — RENAL FUNCTION PANEL
ALBUMIN: 3 g/dL — AB (ref 3.5–5.2)
BUN: 41 mg/dL — ABNORMAL HIGH (ref 6–23)
CO2: 24 mEq/L (ref 19–32)
Calcium: 7.7 mg/dL — ABNORMAL LOW (ref 8.4–10.5)
Chloride: 101 mEq/L (ref 96–112)
Creatinine, Ser: 4.51 mg/dL — ABNORMAL HIGH (ref 0.50–1.35)
GFR calc Af Amer: 14 mL/min — ABNORMAL LOW (ref >90)
GFR, EST NON AFRICAN AMERICAN: 12 mL/min — AB (ref >90)
Glucose, Bld: 95 mg/dL (ref 70–99)
POTASSIUM: 4.6 meq/L (ref 3.7–5.3)
Phosphorus: 5.4 mg/dL — ABNORMAL HIGH (ref 2.3–4.6)
SODIUM: 138 meq/L (ref 137–147)

## 2013-06-21 NOTE — Anesthesia Postprocedure Evaluation (Signed)
  Anesthesia Post-op Note  Patient: Dennis Spencer  Procedure(s) Performed: Procedure(s) (LRB): CYSTOSCOPY WITH RETROGRADE PYELOGRAM, Left URETEROSCOPY with laser , AND bilateral STENT PLACEMENT (Bilateral) HOLMIUM LASER APPLICATION (Bilateral)  Patient Location: PACU  Anesthesia Type: General  Level of Consciousness: awake and alert   Airway and Oxygen Therapy: Patient Spontanous Breathing  Post-op Pain: mild  Post-op Assessment: Post-op Vital signs reviewed, Patient's Cardiovascular Status Stable, Respiratory Function Stable, Patent Airway and No signs of Nausea or vomiting  Last Vitals:  Filed Vitals:   06/21/13 0520  BP: 142/70  Pulse: 57  Temp: 36.4 C  Resp:     Post-op Vital Signs: stable   Complications: No apparent anesthesia complications

## 2013-06-21 NOTE — Discharge Summary (Signed)
Physician Discharge Summary  Patient ID: Dennis Spencer MRN: [id number] DOB/AGE: 05-23-1945 68 y.o.  Admit date: 06/18/2013 Discharge date: 06/21/2013  Admission Diagnoses: 1 - Bilateral Large Ureteral Stones, 2 - Acute Renal Failure   Discharge Diagnoses:  Active Problems:   AKI (acute kidney injury)   Acute renal failure   Discharged Condition: fair  Hospital Course:   1 - Bilateral Large Ureteral Stones - s/p emergent cysto, bilateral stents, left first stage ureteroscopic laser lithotripsy 06/18/13 for large bilateral ureteral stones and renal stones.   2 - Acute Renal Failure - Cr on admission 6 with baseline of 1.0 several most ago. K normal on admit but up to 5.5 2/8, now normalized with diuresis. Nephrology also following.   By 2/10, the day of discharge, pts' Cr trending down, K normal, and felt to be adequate for discharge. Discussed with nephrology team who is in agreement.    Consults: nephrology  Significant Diagnostic Studies: labs: as per above.  Treatments: surgery: emergent cysto, bilateral stents, left first stage ureteroscopic laser lithotripsy 06/18/13 f  Discharge Exam: Blood pressure 142/70, pulse 57, temperature 97.5 F (36.4 C), temperature source Oral, resp. rate 20, height 6' (1.829 m), weight 133.584 kg (294 lb 8 oz), SpO2 99.00%. General appearance: alert, cooperative, appears stated age and wife at bedside Head: Normocephalic, without obvious abnormality, atraumatic Eyes: conjunctivae/corneas clear. PERRL, EOM's intact. Fundi benign. Ears: normal TM's and external ear canals both ears Nose: Nares normal. Septum midline. Mucosa normal. No drainage or sinus tenderness. Throat: lips, mucosa, and tongue normal; teeth and gums normal Neck: no adenopathy, no carotid bruit, no JVD, supple, symmetrical, trachea midline, thyroid not enlarged, symmetric, no tenderness/mass/nodules and trach with PMV in place Back: symmetric, no curvature. ROM normal. No CVA  tenderness. Resp: clear to auscultation bilaterally Chest wall: no tenderness Cardio: regular rate and rhythm, S1, S2 normal, no murmur, click, rub or gallop GI: soft, non-tender; bowel sounds normal; no masses,  no organomegaly Male genitalia: normal Extremities: extremities normal, atraumatic, no cyanosis or edema Pulses: 2+ and symmetric Skin: Skin color, texture, turgor normal. No rashes or lesions Lymph nodes: Cervical, supraclavicular, and axillary nodes normal. Neurologic: Grossly normal  Disposition:      Medication List    STOP taking these medications       aspirin 81 MG chewable tablet     ketorolac 10 MG tablet  Commonly known as:  TORADOL     multivitamin with minerals tablet      TAKE these medications       amLODipine 5 MG tablet  Commonly known as:  NORVASC  Take 1 tablet (5 mg total) by mouth daily.     atorvastatin 40 MG tablet  Commonly known as:  LIPITOR  TAKE 1 TABLET (40 MG TOTAL) BY MOUTH DAILY.     calcium citrate 950 MG tablet  Commonly known as:  CALCITRATE - dosed in mg elemental calcium  Take 200 mg of elemental calcium by mouth daily.     cholecalciferol 1000 UNITS tablet  Commonly known as:  VITAMIN D  Take 2,000 Units by mouth 2 (two) times daily.     gabapentin 600 MG tablet  Commonly known as:  NEURONTIN  Take 300 mg by mouth at bedtime.     omeprazole 20 MG capsule  Commonly known as:  PRILOSEC  Take 20 mg by mouth daily.     vitamin B-12 1000 MCG tablet  Commonly known as:  CYANOCOBALAMIN  Take 1,000 mcg  by mouth daily.     vitamin C 500 MG tablet  Commonly known as:  ASCORBIC ACID  Take 500 mg by mouth 2 (two) times daily.           Follow-up Information   Follow up with Alexis Frock, MD. (We will call you with appt with MD and labs in about 1-2 weeks.)    Specialty:  Urology   Contact information:   [facility]  [place] [address] [phone]        Signed: Alexis Frock 06/21/2013, 12:15 PM

## 2013-06-21 NOTE — Discharge Instructions (Signed)
1 - You may have urinary urgency (bladder spasms) and bloody urine on / off with stent in place. This is normal. ° °2 - Call MD or go to ER for fever >102, severe pain / nausea / vomiting not relieved by medications, or acute change in medical status ° °

## 2013-06-22 LAB — GLUCOSE, CAPILLARY: GLUCOSE-CAPILLARY: 78 mg/dL (ref 70–99)

## 2013-07-11 ENCOUNTER — Other Ambulatory Visit: Payer: Self-pay | Admitting: Urology

## 2013-07-19 ENCOUNTER — Other Ambulatory Visit: Payer: Self-pay | Admitting: Family Medicine

## 2013-07-21 NOTE — Telephone Encounter (Signed)
Patient NTBS for follow up and lab work  And refill

## 2013-07-21 NOTE — Telephone Encounter (Signed)
Detailed message left that patient needs to be seen 

## 2013-07-28 ENCOUNTER — Telehealth: Payer: Self-pay | Admitting: *Deleted

## 2013-07-28 NOTE — Telephone Encounter (Signed)
Needs to have labs in order to get meds

## 2013-07-28 NOTE — Telephone Encounter (Signed)
Pt has appt on 08/01/13 for office visit. Wants refill on Atorvastatin at [facility] since made appt. I dont see any lipids in Epic?

## 2013-07-29 NOTE — Telephone Encounter (Signed)
Patient aware.

## 2013-08-01 ENCOUNTER — Ambulatory Visit: Payer: BC Managed Care – PPO | Admitting: Family Medicine

## 2013-08-01 ENCOUNTER — Encounter (HOSPITAL_COMMUNITY): Payer: Self-pay | Admitting: Pharmacy Technician

## 2013-08-01 ENCOUNTER — Other Ambulatory Visit: Payer: Self-pay | Admitting: *Deleted

## 2013-08-01 MED ORDER — ATORVASTATIN CALCIUM 40 MG PO TABS: ORAL_TABLET | ORAL | Status: DC

## 2013-08-03 ENCOUNTER — Other Ambulatory Visit (HOSPITAL_COMMUNITY): Payer: Medicare HMO

## 2013-08-04 ENCOUNTER — Encounter (HOSPITAL_COMMUNITY)
Admission: RE | Admit: 2013-08-04 | Discharge: 2013-08-04 | Disposition: A | Discharge status: Home / Self Care | Payer: Medicare HMO | Source: Ambulatory Visit | Attending: Urology | Admitting: Urology

## 2013-08-04 ENCOUNTER — Ambulatory Visit (HOSPITAL_COMMUNITY)
Admission: RE | Admit: 2013-08-04 | Discharge: 2013-08-04 | Disposition: A | Discharge status: Home / Self Care | Payer: Medicare HMO | Source: Ambulatory Visit | Attending: Anesthesiology | Admitting: Anesthesiology

## 2013-08-04 ENCOUNTER — Encounter (HOSPITAL_COMMUNITY): Payer: Self-pay

## 2013-08-04 DIAGNOSIS — Z01810 Encounter for preprocedural cardiovascular examination: Secondary | ICD-10-CM | POA: Insufficient documentation

## 2013-08-04 DIAGNOSIS — I10 Essential (primary) hypertension: Secondary | ICD-10-CM | POA: Insufficient documentation

## 2013-08-04 DIAGNOSIS — N200 Calculus of kidney: Secondary | ICD-10-CM | POA: Insufficient documentation

## 2013-08-04 DIAGNOSIS — I252 Old myocardial infarction: Secondary | ICD-10-CM | POA: Insufficient documentation

## 2013-08-04 DIAGNOSIS — Z01812 Encounter for preprocedural laboratory examination: Secondary | ICD-10-CM | POA: Insufficient documentation

## 2013-08-04 DIAGNOSIS — Z87442 Personal history of urinary calculi: Secondary | ICD-10-CM

## 2013-08-04 HISTORY — DX: Gastro-esophageal reflux disease without esophagitis: K21.9

## 2013-08-04 HISTORY — DX: Personal history of urinary calculi: Z87.442

## 2013-08-04 LAB — BASIC METABOLIC PANEL
BUN: 17 mg/dL (ref 6–23)
CHLORIDE: 107 meq/L (ref 96–112)
CO2: 23 meq/L (ref 19–32)
Calcium: 9.4 mg/dL (ref 8.4–10.5)
Creatinine, Ser: 1.42 mg/dL — ABNORMAL HIGH (ref 0.50–1.35)
GFR calc Af Amer: 58 mL/min — ABNORMAL LOW (ref >90)
GFR calc non Af Amer: 50 mL/min — ABNORMAL LOW (ref >90)
Glucose, Bld: 94 mg/dL (ref 70–99)
Potassium: 4.3 mEq/L (ref 3.7–5.3)
SODIUM: 142 meq/L (ref 137–147)

## 2013-08-04 LAB — CBC
HEMATOCRIT: 35.5 % — AB (ref 39.0–52.0)
HEMOGLOBIN: 12.1 g/dL — AB (ref 13.0–17.0)
MCH: 31.6 pg (ref 26.0–34.0)
MCHC: 34.1 g/dL (ref 30.0–36.0)
MCV: 92.7 fL (ref 78.0–100.0)
PLATELETS: 217 10*3/uL (ref 150–400)
RBC: 3.83 MIL/uL — AB (ref 4.22–5.81)
RDW: 13.1 % (ref 11.5–15.5)
WBC: 5.4 10*3/uL (ref 4.0–10.5)

## 2013-08-04 NOTE — Patient Instructions (Addendum)
21 Dennis Spencer  08/04/2013   Your procedure is scheduled on:   3-30--2015  Report to [facility] at    0900    AM .  Call this number if you have problems the morning of surgery: [phone]  Or Presurgical Testing [phone](Abbott Jasinski) For Living Will and/or Health Care Power Attorney Forms: please provide copy for your medical record,may bring AM of surgery(Forms should be already notarized -we do not provide this service).(08-04-13 Information packets given x2 today per patient request).  Remember: Follow any bowel prep instructions per MD office. For Cpap use: Bring mask and tubing only.   Do not eat food:After Midnight.    Take these medicines the morning of surgery with A SIP OF WATER: Amlodipine. Omeprazole.   Do not wear jewelry, make-up or nail polish.  Do not wear lotions, powders, or perfumes. You may wear deodorant.  Do not shave 48 hours(2 days) prior to first CHG shower(legs and under arms).(Shaving face and neck okay.)  Do not bring valuables to the hospital.(Hospital is not responsible for lost valuables).  Contacts, dentures or removable bridgework, body piercing, hair pins may not be worn into surgery.  Leave suitcase in the car. After surgery it may be brought to your room.  For patients admitted to the hospital, checkout time is 11:00 AM the day of discharge.(Restricted visitors-Any Persons displaying flu-like symptoms or illness).    Patients discharged the day of surgery will not be allowed to drive home. Must have responsible person with you x 24 hours once discharged.  Name and phone number of your driver: Helene Kelp -spouse [phone] cell  Special Instructions: CHG(Chlorhedine 4%-"Hibiclens","Betasept","Aplicare") Shower Use Special Wash: see special instructions.(avoid face and genitals)   Please read over the following fact sheets that you were given:  Incentive Spirometry Instruction.    Failure to follow these instructions may result in  Cancellation of your surgery.   Patient signature_______________________________________________________

## 2013-08-04 NOTE — Pre-Procedure Instructions (Signed)
08-04-13 EKG/ CXR done today. Dr. Winfred Leeds, Dr. Lissa Hoard reviewed EKG done today.

## 2013-08-07 MED ORDER — GENTAMICIN SULFATE 40 MG/ML IJ SOLN
480.0000 mg | INTRAVENOUS | Status: AC
  Administered 2013-08-08: 480 mg via INTRAVENOUS
  Filled 2013-08-07: qty 12

## 2013-08-08 ENCOUNTER — Inpatient Hospital Stay (HOSPITAL_COMMUNITY): Payer: Medicare HMO

## 2013-08-08 ENCOUNTER — Inpatient Hospital Stay (HOSPITAL_COMMUNITY): Payer: Medicare HMO | Admitting: Certified Registered Nurse Anesthetist

## 2013-08-08 ENCOUNTER — Encounter (HOSPITAL_COMMUNITY): Payer: Self-pay | Admitting: *Deleted

## 2013-08-08 ENCOUNTER — Inpatient Hospital Stay (HOSPITAL_COMMUNITY)
Admission: RE | Admit: 2013-08-08 | Discharge: 2013-08-11 | DRG: 660 | Disposition: A | Discharge status: Home / Self Care | Payer: Medicare HMO | Source: Ambulatory Visit | Attending: Urology | Admitting: Urology

## 2013-08-08 ENCOUNTER — Encounter (HOSPITAL_COMMUNITY)
Admission: RE | Disposition: A | Discharge status: Home / Self Care | Payer: Self-pay | Source: Ambulatory Visit | Attending: Urology

## 2013-08-08 ENCOUNTER — Encounter (HOSPITAL_COMMUNITY): Payer: Medicare HMO | Admitting: Certified Registered Nurse Anesthetist

## 2013-08-08 DIAGNOSIS — Z87891 Personal history of nicotine dependence: Secondary | ICD-10-CM

## 2013-08-08 DIAGNOSIS — Z930 Tracheostomy status: Secondary | ICD-10-CM

## 2013-08-08 DIAGNOSIS — E748 Other specified disorders of carbohydrate metabolism: Secondary | ICD-10-CM

## 2013-08-08 DIAGNOSIS — N289 Disorder of kidney and ureter, unspecified: Secondary | ICD-10-CM | POA: Diagnosis present

## 2013-08-08 DIAGNOSIS — E7489 Other specified disorders of carbohydrate metabolism: Secondary | ICD-10-CM | POA: Diagnosis present

## 2013-08-08 DIAGNOSIS — Z87442 Personal history of urinary calculi: Secondary | ICD-10-CM

## 2013-08-08 DIAGNOSIS — N201 Calculus of ureter: Secondary | ICD-10-CM | POA: Diagnosis present

## 2013-08-08 DIAGNOSIS — Z6841 Body Mass Index (BMI) 40.0 and over, adult: Secondary | ICD-10-CM

## 2013-08-08 DIAGNOSIS — E7800 Pure hypercholesterolemia, unspecified: Secondary | ICD-10-CM | POA: Diagnosis present

## 2013-08-08 DIAGNOSIS — N200 Calculus of kidney: Principal | ICD-10-CM | POA: Diagnosis present

## 2013-08-08 DIAGNOSIS — N1330 Unspecified hydronephrosis: Secondary | ICD-10-CM | POA: Diagnosis present

## 2013-08-08 DIAGNOSIS — Z9884 Bariatric surgery status: Secondary | ICD-10-CM

## 2013-08-08 DIAGNOSIS — I10 Essential (primary) hypertension: Secondary | ICD-10-CM | POA: Diagnosis present

## 2013-08-08 DIAGNOSIS — K219 Gastro-esophageal reflux disease without esophagitis: Secondary | ICD-10-CM | POA: Diagnosis present

## 2013-08-08 DIAGNOSIS — G4730 Sleep apnea, unspecified: Secondary | ICD-10-CM | POA: Diagnosis present

## 2013-08-08 HISTORY — PX: NEPHROLITHOTOMY: SHX5134

## 2013-08-08 HISTORY — PX: CYSTOSCOPY WITH RETROGRADE PYELOGRAM, URETEROSCOPY AND STENT PLACEMENT: SHX5789

## 2013-08-08 LAB — BASIC METABOLIC PANEL
BUN: 11 mg/dL (ref 6–23)
CHLORIDE: 106 meq/L (ref 96–112)
CO2: 24 mEq/L (ref 19–32)
Calcium: 8.9 mg/dL (ref 8.4–10.5)
Creatinine, Ser: 1.7 mg/dL — ABNORMAL HIGH (ref 0.50–1.35)
GFR calc Af Amer: 47 mL/min — ABNORMAL LOW (ref >90)
GFR, EST NON AFRICAN AMERICAN: 40 mL/min — AB (ref >90)
Glucose, Bld: 124 mg/dL — ABNORMAL HIGH (ref 70–99)
Potassium: 4.5 mEq/L (ref 3.7–5.3)
Sodium: 140 mEq/L (ref 137–147)

## 2013-08-08 LAB — HEMOGLOBIN AND HEMATOCRIT, BLOOD
HCT: 33.9 % — ABNORMAL LOW (ref 39.0–52.0)
HEMOGLOBIN: 11.7 g/dL — AB (ref 13.0–17.0)

## 2013-08-08 SURGERY — NEPHROLITHOTOMY PERCUTANEOUS
Anesthesia: General | Laterality: Right

## 2013-08-08 MED ORDER — ONDANSETRON HCL 4 MG/2ML IJ SOLN: 4.0000 mg | INTRAMUSCULAR | Status: DC | PRN

## 2013-08-08 MED ORDER — ONDANSETRON HCL 4 MG/2ML IJ SOLN
INTRAMUSCULAR | Status: AC
  Filled 2013-08-08: qty 2

## 2013-08-08 MED ORDER — PANTOPRAZOLE SODIUM 40 MG PO TBEC
40.0000 mg | DELAYED_RELEASE_TABLET | Freq: Every day | ORAL | Status: DC
  Administered 2013-08-09 – 2013-08-11 (×3): 40 mg via ORAL
  Filled 2013-08-08 (×3): qty 1

## 2013-08-08 MED ORDER — GLYCOPYRROLATE 0.2 MG/ML IJ SOLN
INTRAMUSCULAR | Status: DC | PRN
  Administered 2013-08-08: .8 mg via INTRAVENOUS

## 2013-08-08 MED ORDER — NEOSTIGMINE METHYLSULFATE 1 MG/ML IJ SOLN
INTRAMUSCULAR | Status: AC
  Filled 2013-08-08: qty 10

## 2013-08-08 MED ORDER — LACTATED RINGERS IV SOLN
INTRAVENOUS | Status: DC
  Administered 2013-08-08: 1000 mL via INTRAVENOUS
  Administered 2013-08-08 (×3): via INTRAVENOUS

## 2013-08-08 MED ORDER — ROCURONIUM BROMIDE 100 MG/10ML IV SOLN
INTRAVENOUS | Status: AC
  Filled 2013-08-08: qty 1

## 2013-08-08 MED ORDER — OMEPRAZOLE MAGNESIUM 20 MG PO TBEC: 20.0000 mg | DELAYED_RELEASE_TABLET | Freq: Every day | ORAL | Status: DC

## 2013-08-08 MED ORDER — ONDANSETRON HCL 4 MG/2ML IJ SOLN
INTRAMUSCULAR | Status: DC | PRN
  Administered 2013-08-08: 4 mg via INTRAVENOUS

## 2013-08-08 MED ORDER — LACTATED RINGERS IV SOLN: INTRAVENOUS | Status: DC

## 2013-08-08 MED ORDER — FENTANYL CITRATE 0.05 MG/ML IJ SOLN
INTRAMUSCULAR | Status: DC | PRN
  Administered 2013-08-08 (×2): 50 ug via INTRAVENOUS
  Administered 2013-08-08: 100 ug via INTRAVENOUS
  Administered 2013-08-08: 50 ug via INTRAVENOUS

## 2013-08-08 MED ORDER — OXYCODONE HCL 5 MG PO TABS
5.0000 mg | ORAL_TABLET | ORAL | Status: DC | PRN
  Administered 2013-08-10: 5 mg via ORAL
  Filled 2013-08-08: qty 1

## 2013-08-08 MED ORDER — HYDROMORPHONE HCL PF 1 MG/ML IJ SOLN
INTRAMUSCULAR | Status: AC
  Filled 2013-08-08: qty 1

## 2013-08-08 MED ORDER — PROPOFOL 10 MG/ML IV BOLUS
INTRAVENOUS | Status: AC
  Filled 2013-08-08: qty 20

## 2013-08-08 MED ORDER — NEOSTIGMINE METHYLSULFATE 1 MG/ML IJ SOLN
INTRAMUSCULAR | Status: DC | PRN
  Administered 2013-08-08: 5 mg via INTRAVENOUS

## 2013-08-08 MED ORDER — ACETAMINOPHEN 500 MG PO TABS
1000.0000 mg | ORAL_TABLET | Freq: Three times a day (TID) | ORAL | Status: AC
  Administered 2013-08-08 – 2013-08-09 (×3): 1000 mg via ORAL
  Filled 2013-08-08 (×3): qty 2

## 2013-08-08 MED ORDER — SODIUM CHLORIDE 0.9 % IR SOLN
Status: DC | PRN
  Administered 2013-08-08: 7000 mL
  Administered 2013-08-08: 8000 mL via INTRAVESICAL

## 2013-08-08 MED ORDER — MIDAZOLAM HCL 5 MG/5ML IJ SOLN
INTRAMUSCULAR | Status: DC | PRN
  Administered 2013-08-08: 2 mg via INTRAVENOUS

## 2013-08-08 MED ORDER — SENNA 8.6 MG PO TABS
1.0000 | ORAL_TABLET | Freq: Two times a day (BID) | ORAL | Status: DC
  Administered 2013-08-08 – 2013-08-11 (×6): 8.6 mg via ORAL
  Filled 2013-08-08 (×6): qty 1

## 2013-08-08 MED ORDER — MIDAZOLAM HCL 2 MG/2ML IJ SOLN
INTRAMUSCULAR | Status: AC
  Filled 2013-08-08: qty 2

## 2013-08-08 MED ORDER — GLYCOPYRROLATE 0.2 MG/ML IJ SOLN
INTRAMUSCULAR | Status: AC
  Filled 2013-08-08: qty 1

## 2013-08-08 MED ORDER — FENTANYL CITRATE 0.05 MG/ML IJ SOLN
INTRAMUSCULAR | Status: AC
  Filled 2013-08-08: qty 5

## 2013-08-08 MED ORDER — ROCURONIUM BROMIDE 100 MG/10ML IV SOLN
INTRAVENOUS | Status: DC | PRN
  Administered 2013-08-08 (×2): 10 mg via INTRAVENOUS
  Administered 2013-08-08: 20 mg via INTRAVENOUS
  Administered 2013-08-08: 50 mg via INTRAVENOUS

## 2013-08-08 MED ORDER — PROPOFOL 10 MG/ML IV BOLUS
INTRAVENOUS | Status: DC | PRN
  Administered 2013-08-08: 200 mg via INTRAVENOUS

## 2013-08-08 MED ORDER — PROMETHAZINE HCL 25 MG/ML IJ SOLN: 6.2500 mg | INTRAMUSCULAR | Status: DC | PRN

## 2013-08-08 MED ORDER — DOCUSATE SODIUM 100 MG PO CAPS
100.0000 mg | ORAL_CAPSULE | Freq: Two times a day (BID) | ORAL | Status: DC
  Administered 2013-08-08 – 2013-08-11 (×6): 100 mg via ORAL
  Filled 2013-08-08 (×7): qty 1

## 2013-08-08 MED ORDER — AMLODIPINE BESYLATE 5 MG PO TABS
5.0000 mg | ORAL_TABLET | Freq: Every morning | ORAL | Status: DC
  Administered 2013-08-09 – 2013-08-11 (×3): 5 mg via ORAL
  Filled 2013-08-08 (×3): qty 1

## 2013-08-08 MED ORDER — ATORVASTATIN CALCIUM 40 MG PO TABS
40.0000 mg | ORAL_TABLET | Freq: Every evening | ORAL | Status: DC
  Administered 2013-08-08 – 2013-08-09 (×2): 40 mg via ORAL
  Filled 2013-08-08 (×4): qty 1

## 2013-08-08 MED ORDER — KCL IN DEXTROSE-NACL 10-5-0.45 MEQ/L-%-% IV SOLN
INTRAVENOUS | Status: DC
Start: 2013-08-08 — End: 2013-08-10
  Administered 2013-08-08 – 2013-08-09 (×2): via INTRAVENOUS
  Filled 2013-08-08 (×5): qty 1000

## 2013-08-08 MED ORDER — GLYCOPYRROLATE 0.2 MG/ML IJ SOLN
INTRAMUSCULAR | Status: AC
  Filled 2013-08-08: qty 3

## 2013-08-08 MED ORDER — HYDROMORPHONE HCL PF 1 MG/ML IJ SOLN
0.5000 mg | INTRAMUSCULAR | Status: DC | PRN
  Administered 2013-08-09: 1 mg via INTRAVENOUS
  Filled 2013-08-08: qty 1

## 2013-08-08 MED ORDER — HYDROMORPHONE HCL PF 1 MG/ML IJ SOLN
0.2500 mg | INTRAMUSCULAR | Status: DC | PRN
  Administered 2013-08-08 (×2): 0.5 mg via INTRAVENOUS

## 2013-08-08 MED ORDER — IOHEXOL 300 MG/ML  SOLN
INTRAMUSCULAR | Status: DC | PRN
  Administered 2013-08-08: 56 mL via URETHRAL

## 2013-08-08 MED ORDER — CALCIUM CITRATE-VITAMIN D 500-400 MG-UNIT PO CHEW
1.0000 | CHEWABLE_TABLET | Freq: Every day | ORAL | Status: DC
  Administered 2013-08-09 – 2013-08-10 (×2): 1 via ORAL
  Filled 2013-08-08 (×3): qty 1

## 2013-08-08 MED ORDER — CALCIUM CITRATE-VITAMIN D 315-200 MG-UNIT PO TABS: 1.0000 | ORAL_TABLET | Freq: Two times a day (BID) | ORAL | Status: DC

## 2013-08-08 MED ORDER — GABAPENTIN 300 MG PO CAPS
300.0000 mg | ORAL_CAPSULE | Freq: Every day | ORAL | Status: DC
  Administered 2013-08-08 – 2013-08-10 (×3): 300 mg via ORAL
  Filled 2013-08-08 (×4): qty 1

## 2013-08-08 SURGICAL SUPPLY — 73 items
APL SKNCLS STERI-STRIP NONHPOA (GAUZE/BANDAGES/DRESSINGS) ×3
BAG URINE DRAINAGE (UROLOGICAL SUPPLIES) ×10 IMPLANT
BASKET LASER NITINOL 1.9FR (BASKET) ×6 IMPLANT
BASKET STNLS GEMINI 4WIRE 3FR (BASKET) IMPLANT
BASKET ZERO TIP NITINOL 2.4FR (BASKET) ×5 IMPLANT
BENZOIN TINCTURE PRP APPL 2/3 (GAUZE/BANDAGES/DRESSINGS) ×6 IMPLANT
BLADE SURG 15 STRL LF DISP TIS (BLADE) ×3 IMPLANT
BLADE SURG 15 STRL SS (BLADE) ×5
BSKT STON RTRVL 120 1.9FR (BASKET) ×6
BSKT STON RTRVL GEM 120X11 3FR (BASKET)
BSKT STON RTRVL ZERO TP 2.4FR (BASKET) ×3
CATCHER STONE W/TUBE ADAPTER (UROLOGICAL SUPPLIES) ×1 IMPLANT
CATH BEACON 5.038 65CM KMP-01 (CATHETERS) ×5 IMPLANT
CATH FOLEY 2W COUNCIL 20FR 5CC (CATHETERS) IMPLANT
CATH FOLEY 2WAY SLVR  5CC 16FR (CATHETERS)
CATH FOLEY 2WAY SLVR 5CC 16FR (CATHETERS) ×2 IMPLANT
CATH INTERMIT  6FR 70CM (CATHETERS) ×5 IMPLANT
CATH MULTI PURPOSE 16FR DRAIN (STENTS) ×3 IMPLANT
CATH ROBINSON RED A/P 20FR (CATHETERS) IMPLANT
CATH TIEMANN FOLEY 18FR 5CC (CATHETERS) ×4 IMPLANT
CATH X-FORCE N30 NEPHROSTOMY (TUBING) ×5 IMPLANT
CHLORAPREP W/TINT 26ML (MISCELLANEOUS) ×10 IMPLANT
CLOTH BEACON ORANGE TIMEOUT ST (SAFETY) ×5 IMPLANT
COVER SURGICAL LIGHT HANDLE (MISCELLANEOUS) ×5 IMPLANT
DRAPE C-ARM 42X120 X-RAY (DRAPES) ×5 IMPLANT
DRAPE CAMERA CLOSED 9X96 (DRAPES) ×10 IMPLANT
DRAPE LG THREE QUARTER DISP (DRAPES) ×8 IMPLANT
DRAPE LINGEMAN PERC (DRAPES) ×5 IMPLANT
DRAPE SURG IRRIG POUCH 19X23 (DRAPES) ×5 IMPLANT
DRSG TEGADERM 8X12 (GAUZE/BANDAGES/DRESSINGS) ×6 IMPLANT
ELECT REM PT RETURN 9FT ADLT (ELECTROSURGICAL)
ELECTRODE REM PT RTRN 9FT ADLT (ELECTROSURGICAL) IMPLANT
FIBER LASER FLEXIVA 200 (UROLOGICAL SUPPLIES) ×4 IMPLANT
FIBER LASER FLEXIVA 365 (UROLOGICAL SUPPLIES) IMPLANT
FIBER LASER FLEXIVA 550 (UROLOGICAL SUPPLIES) IMPLANT
GLOVE BIOGEL M STRL SZ7.5 (GLOVE) ×23 IMPLANT
GOWN STRL REUS W/TWL LRG LVL3 (GOWN DISPOSABLE) ×5 IMPLANT
GOWN STRL REUS W/TWL XL LVL3 (GOWN DISPOSABLE) ×15 IMPLANT
GUIDEWIRE AMPLAZ .035X145 (WIRE) ×10 IMPLANT
GUIDEWIRE ANG ZIPWIRE 038X150 (WIRE) ×16 IMPLANT
GUIDEWIRE STR DUAL SENSOR (WIRE) ×5 IMPLANT
IV NS IRRIG 3000ML ARTHROMATIC (IV SOLUTION) ×11 IMPLANT
KIT BASIN OR (CUSTOM PROCEDURE TRAY) ×5 IMPLANT
LASER FIBER DISP 1000U (UROLOGICAL SUPPLIES) IMPLANT
MANIFOLD NEPTUNE II (INSTRUMENTS) ×5 IMPLANT
NDL TROCAR 18X15 ECHO (NEEDLE) IMPLANT
NDL TROCAR 18X20 (NEEDLE) IMPLANT
NEEDLE TROCAR 18X15 ECHO (NEEDLE) IMPLANT
NEEDLE TROCAR 18X20 (NEEDLE) IMPLANT
NS IRRIG 1000ML POUR BTL (IV SOLUTION) ×5 IMPLANT
PACK BASIC VI WITH GOWN DISP (CUSTOM PROCEDURE TRAY) ×5 IMPLANT
PACK CYSTO (CUSTOM PROCEDURE TRAY) ×5 IMPLANT
PAD ABD 7.5X8 STRL (GAUZE/BANDAGES/DRESSINGS) ×10 IMPLANT
PAD ABD 8X10 STRL (GAUZE/BANDAGES/DRESSINGS) ×3 IMPLANT
PROBE LITHOCLAST ULTRA 3.8X403 (UROLOGICAL SUPPLIES) ×3 IMPLANT
PROBE PNEUMATIC 1.0MMX570MM (UROLOGICAL SUPPLIES) ×5 IMPLANT
SET IRRIG Y TYPE TUR BLADDER L (SET/KITS/TRAYS/PACK) ×2 IMPLANT
SET WARMING FLUID IRRIGATION (MISCELLANEOUS) ×4 IMPLANT
SPONGE GAUZE 4X4 12PLY (GAUZE/BANDAGES/DRESSINGS) ×5 IMPLANT
SPONGE LAP 4X18 X RAY DECT (DISPOSABLE) ×5 IMPLANT
STENT CONTOUR 6FRX26X.038 (STENTS) ×4 IMPLANT
STONE CATCHER W/TUBE ADAPTER (UROLOGICAL SUPPLIES) ×5 IMPLANT
SUT SILK 2 0 30  PSL (SUTURE) ×4
SUT SILK 2 0 30 PSL (SUTURE) ×5 IMPLANT
SYR 20CC LL (SYRINGE) ×10 IMPLANT
SYRINGE 10CC LL (SYRINGE) ×5 IMPLANT
SYRINGE IRR TOOMEY STRL 70CC (SYRINGE) IMPLANT
TOWEL OR 17X26 10 PK STRL BLUE (TOWEL DISPOSABLE) ×5 IMPLANT
TUBE CONNECTING VINYL 14FR 30C (MISCELLANEOUS) ×3 IMPLANT
TUBE FEEDING 8FR 16IN STR KANG (MISCELLANEOUS) ×5 IMPLANT
TUBING CONNECTING 10 (TUBING) ×10 IMPLANT
TUBING CONNECTING 10' (TUBING) ×2
WATER STERILE IRR 1500ML POUR (IV SOLUTION) ×5 IMPLANT

## 2013-08-08 NOTE — Anesthesia Postprocedure Evaluation (Signed)
Anesthesia Post Note  Patient: Dennis Spencer  Procedure(s) Performed: Procedure(s) (LRB): 1ST STAGE NEPHROLITHOTOMY PERCUTANEOUS WITH SURGEON ACCESS (Left) CYSTOSCOPY WITH RIGHT RETROGRADE PYELOGRAM, URETEROSCOPY AND STENT PLACEMENT, stone extraction (Right)  Anesthesia type: General  Patient location: PACU  Post pain: Pain level controlled  Post assessment: Post-op Vital signs reviewed  Last Vitals: BP 156/65  Pulse 66  Temp(Src) 36.3 C (Oral)  Resp 14  Ht 5\' 10"  (1.778 m)  Wt 294 lb 12.1 oz (133.7 kg)  BMI 42.29 kg/m2  SpO2 98%  Post vital signs: Reviewed  Level of consciousness: sedated  Complications: No apparent anesthesia complications

## 2013-08-08 NOTE — Transfer of Care (Signed)
Immediate Anesthesia Transfer of Care Note  Patient: Dennis Spencer  Procedure(s) Performed: Procedure(s): 1ST STAGE NEPHROLITHOTOMY PERCUTANEOUS WITH SURGEON ACCESS (Left) CYSTOSCOPY WITH RIGHT RETROGRADE PYELOGRAM, URETEROSCOPY AND STENT PLACEMENT, stone extraction (Right)  Patient Location: PACU  Anesthesia Type:General  Level of Consciousness: awake, alert , oriented and patient cooperative  Airway & Oxygen Therapy: Patient Spontanous Breathing and Patient connected to face mask oxygen  Post-op Assessment: Report given to PACU RN and Post -op Vital signs reviewed and stable  Post vital signs: Reviewed and stable  Complications: No apparent anesthesia complications

## 2013-08-08 NOTE — Anesthesia Preprocedure Evaluation (Addendum)
Anesthesia Evaluation  Patient identified by MRN, date of birth, ID band Patient awake    Reviewed: Allergy & Precautions, H&P , NPO status , Patient's Chart, lab work & pertinent test results  Airway Mallampati: II TM Distance: >3 FB Neck ROM: Full    Dental no notable dental hx. (+) Teeth Intact, Dental Advisory Given   Pulmonary sleep apnea , former smoker,  Tracheostomy site breath sounds clear to auscultation  + decreased breath sounds      Cardiovascular hypertension, Pt. on medications Rhythm:Regular Rate:Normal     Neuro/Psych negative neurological ROS  negative psych ROS   GI/Hepatic negative GI ROS, Neg liver ROS, GERD-  ,  Endo/Other  Morbid obesity  Renal/GU Renal diseasenegative Renal ROS  negative genitourinary   Musculoskeletal negative musculoskeletal ROS (+)   Abdominal (+) + obese,   Peds negative pediatric ROS (+)  Hematology negative hematology ROS (+)   Anesthesia Other Findings Tracheostomy site  Reproductive/Obstetrics                          Anesthesia Physical Anesthesia Plan  ASA: III  Anesthesia Plan: General   Post-op Pain Management:    Induction: Intravenous  Airway Management Planned: Tracheostomy  Additional Equipment:   Intra-op Plan:   Post-operative Plan: Extubation in OR  Informed Consent: I have reviewed the patients History and Physical, chart, labs and discussed the procedure including the risks, benefits and alternatives for the proposed anesthesia with the patient or authorized representative who has indicated his/her understanding and acceptance.   Dental advisory given  Plan Discussed with: CRNA  Anesthesia Plan Comments: (Cuffed ETT via chronic tracheostomy site for maintenance of general anesthesia.)        Anesthesia Quick Evaluation

## 2013-08-08 NOTE — Brief Op Note (Signed)
08/08/2013  5:14 PM  PATIENT:  Dennis Spencer  68 y.o. male  PRE-OPERATIVE DIAGNOSIS:  LEFT STAGHORN, RIGHT LARGE URETERAL STONE  POST-OPERATIVE DIAGNOSIS:  large staghorn, right ureteral stone  PROCEDURE:  Procedure(s): 1ST STAGE NEPHROLITHOTOMY PERCUTANEOUS WITH SURGEON ACCESS (Left) CYSTOSCOPY WITH RIGHT RETROGRADE PYELOGRAM, URETEROSCOPY AND STENT PLACEMENT, stone extraction (Right)  SURGEON:  Surgeon(s) and Role:    * Alexis Frock, MD - Primary  PHYSICIAN ASSISTANT:   ASSISTANTS: none   ANESTHESIA:   general  EBL:  Total I/O In: 2000 [I.V.:2000] Out: 100 [Urine:100]  BLOOD ADMINISTERED:none  DRAINS: Left nephrostomy + nephroureteral stent, foley   LOCAL MEDICATIONS USED:  NONE  SPECIMEN:  Source of Specimen:  Rt Ureteral + Lt Renal / Ureteral Stones  DISPOSITION OF SPECIMEN:  [facility] for Compositional analysis  COUNTS:  YES  TOURNIQUET:  * No tourniquets in log *  DICTATION: .Other Dictation: Dictation Number [id number]  PLAN OF CARE: Admit to inpatient   PATIENT DISPOSITION:  PACU - hemodynamically stable.   Delay start of Pharmacological VTE agent (>24hrs) due to surgical blood loss or risk of bleeding: yes

## 2013-08-08 NOTE — H&P (Signed)
Dennis Spencer is an 68 y.o. male.    Chief Complaint: Pre-Op First Stage Ureteroscopic Stone Manipulation  HPI:   1 - Recurrent Surgical Nephrolithiasis -  2011 - PCNL for large tones 2015 - Lt URS and bilat JJ stents 06/2013 to begin multi-stage approach to huge volume left renal and bilateral ureteral stones, Total stone volume >6cm2.  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 Currently on CaCO4  daily.  Eval 2011 - 24 hr urine - severe hyperoxaluria >170 Eval 2015 - BMP,PTH,Urate - normal, Composition - CaOx; 24 hr Urines - pending.   3 - Renal Insuficiency - Baseline approx 1.0. Had acute rise to 6 during episode 06/2013 with bilateral obstructing stones. K acceptable during episode. Most recent Cr 1.75 06/2013.  4 - Prostate Screening - No FHX prostate cancer.  06/2013 - DRE 25 gm smooth,   PMH sig for morbid obesity, trach (since 1980s). Denies ischemic heart disease. His PCP is Morrie Sheldon MD with [facility].   Today Aahil is seen to proceed with second stage ureteroscopic stone manipulation. Most recent UCX negative.  Past Medical History  Diagnosis Date  . Kidney stones   . Hypertension   . Hypercholesteremia   . Sleep apnea   . Osteomyelitis   . 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  Marland Kitchen GERD (gastroesophageal reflux disease)     Past Surgical History  Procedure Laterality Date  . Lithotripsy    . Lithotripsy    . Percutaneous nephrostolithotomy    . Cystoscopy with retrograde pyelogram, ureteroscopy and stent placement Bilateral 06/18/2013    Procedure: CYSTOSCOPY WITH RETROGRADE PYELOGRAM, Left URETEROSCOPY with laser , AND bilateral STENT PLACEMENT;  Surgeon: Alexis Frock, MD;  Location: [facility];  Service: Urology;  Laterality: Bilateral;  . Holmium laser application  Bilateral 05/18/735    Procedure: HOLMIUM LASER APPLICATION;  Surgeon: Alexis Frock, MD;  Location: [facility];  Service: Urology;  Laterality: Bilateral;  . Roux en y      [facility] (weight lost 155 lbs.) Stable -has gained 20 lbs over desired 250#  desired level  . Tracheostomy      '87- s/p tongue and nasal septum deviation and Sleep apnea surgery  . Tonsillectomy      child    No family history on file. Social History:  reports that he quit smoking about 45 years ago. His smoking use included Cigarettes. He smoked 0.00 packs per day. He does not have any smokeless tobacco history on file. He reports that he does not drink alcohol or use illicit drugs.  Allergies: No Known Allergies  No prescriptions prior to admission    No results found for this or any previous visit (from the past 48 hour(s)). No results found.  Review of Systems  Constitutional: Negative.  Negative for fever and chills.  HENT: Negative.   Eyes: Negative.   Respiratory: Negative.   Cardiovascular: Negative.   Gastrointestinal: Negative.   Genitourinary: Negative.   Musculoskeletal: Negative.   Skin: Negative.   Neurological: Negative.   Endo/Heme/Allergies: Negative.   Psychiatric/Behavioral: Negative.     There were no vitals taken for this visit. Physical Exam  Constitutional: He appears well-developed and well-nourished.  Stable morbid obesity  HENT:  Head: Normocephalic and atraumatic.  Eyes: EOM are normal.  Pupils are equal, round, and reactive to light.  Neck: Normal range of motion. Neck supple.  Trach in place c/d/i  Cardiovascular: Normal rate.   Respiratory: Effort normal and breath sounds normal.  GI: Soft. Bowel sounds are normal.  Genitourinary: Penis normal.  Musculoskeletal: Normal range of motion.  Neurological: He is alert.  Skin: Skin is warm and dry.  Psychiatric: He has a normal mood and affect. His behavior is normal. Judgment and thought content normal.      Assessment/Plan  1 - Recurrent Surgical Nephrolithiasis - Very large bilateral stone burden. Discussed need for staged combination retrograde / antegrade approach. Now s/o clearance of left ureteral stone, but still has left large volume renal and rt ureteral stone. Propose Two more stages as follows: I - Rt 1st stage URS / Lt 1st stage PCNL (mid access if possible as some left rotation) II - Rt 2nd stage URS / Lt 2nd stage PCNL 2-3 days later during same hospitalization.  We rediscussed percutaneous nephrostolithotomy (PCNL) in detail including need for percutaneous access which is sometimes gained by the surgeon, and other times by radiology or through existing nephrostomy tubes if present. We specifically discussed that often times tubes remain in after surgery until we are confident all stone has been treated. We mentioned that staged surgery is needed in over 50% of cases of very large or complex stone. We then discussed general risks including bleeding, infection, damage to kidney / ureter / bladder, loss of kidney, as well as anesthetic risks and rare but serious surgical complications including DVT, PE, MI, and mortality.  We rediscussed ureteroscopic stone manipulation with basketing and laser-lithotripsy in detail.  We discussed risks including bleeding, infection, damage to kidney / ureter  bladder, rarely loss of kidney. We discussed anesthetic risks and rare but serious surgical complications including DVT, PE, MI, and mortality. We specifically addressed that in 5-10% of cases a staged approach is required with stenting followed by re-attempt ureteroscopy if anatomy unfavorable. The patient voiced understanding and wises to proceed.   2 - Enteric Hyperoxaluria / Medical Stone Disease - likely sig enteric hyperoxaluria from R+Y as chief contributer. Will likely need drastically increased PO Ca intake with meals, will repeat 24 hr urines on current regimen when stone free to verify.   3  - Renal Insufficiency - partial recovery after stenting, continue monitoring.    Deva Ron 08/08/2013, 7:56 AM

## 2013-08-09 ENCOUNTER — Encounter (HOSPITAL_COMMUNITY): Payer: Self-pay | Admitting: Urology

## 2013-08-09 LAB — BASIC METABOLIC PANEL
BUN: 12 mg/dL (ref 6–23)
CO2: 26 mEq/L (ref 19–32)
CREATININE: 1.7 mg/dL — AB (ref 0.50–1.35)
Calcium: 8.3 mg/dL — ABNORMAL LOW (ref 8.4–10.5)
Chloride: 104 mEq/L (ref 96–112)
GFR calc non Af Amer: 40 mL/min — ABNORMAL LOW (ref >90)
GFR, EST AFRICAN AMERICAN: 47 mL/min — AB (ref >90)
Glucose, Bld: 115 mg/dL — ABNORMAL HIGH (ref 70–99)
Potassium: 4.6 mEq/L (ref 3.7–5.3)
Sodium: 137 mEq/L (ref 137–147)

## 2013-08-09 LAB — HEMOGLOBIN AND HEMATOCRIT, BLOOD
HCT: 31.2 % — ABNORMAL LOW (ref 39.0–52.0)
Hemoglobin: 10.7 g/dL — ABNORMAL LOW (ref 13.0–17.0)

## 2013-08-09 MED ORDER — GENTAMICIN SULFATE 40 MG/ML IJ SOLN
480.0000 mg | INTRAVENOUS | Status: AC
  Administered 2013-08-10: 480 mg via INTRAVENOUS
  Filled 2013-08-09 (×3): qty 12

## 2013-08-09 NOTE — Op Note (Signed)
Dennis Spencer, Dennis Spencer              ACCOUNT NO.:  [id number]  MEDICAL RECORD NO.:  [id number]  LOCATION:  2202                         FACILITY:  [facility]  PHYSICIAN:  Alexis Frock, MD     DATE OF BIRTH:  05/25/1945  DATE OF PROCEDURE:  08/08/2013 DATE OF DISCHARGE:                              OPERATIVE REPORT   DIAGNOSIS:  Large volume left renal and bilateral ureteral stones.  PROCEDURES: 1. Cystoscopy with bilateral retrograde pyelogram with interpretation. 2. Right first stage ureteroscopy with laser lithotripsy and stent     exchange, 6 x 26, no tether. 3. Left ureteral stent exchange. 4. Left percutaneous access to the kidney and dilation to tract. 5. Left first stage percutaneous nephrostolithotomy stone greater than     2 cm. 6. Left nephrostomy tube placement.  ESTIMATED BLOOD LOSS:  100 mL.  COMPLICATIONS:  None.  SPECIMEN:  Right ureteral and left renal and ureteral stones for compositional analysis.  FINDINGS: 1. Unremarkable urinary bladder. 2. Massive bilateral hydroureteronephrosis, large filling defects     consistent with left renal and ureteral stones and right ureteral     stones. 3. Ablation and removal of approximately 60% of right ureteral stone. 4. Ablation and removal of approximately 60% of left renal and     ureteral stones.  DRAINS: 1. Foley catheter to straight drain, 18-French Coude. 2. Left nephrostomy to straight drain, 16-French. 3. Left nephroureteral stent capped.  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  He presented emergently last month to the [facility] Emergency Room with complete anuria, acute renal failure and very large volume bilateral nephrolithiasis including bilateral ureteral stones.  At that time, he underwent bilateral stenting and left ureteroscopic stone manipulation.  This  temporizing measure.  His creatinine has since trended back down towards normal, most recently 1.75.  Most recent urine culture was negative.  Options were discussed for further management including bilateral staged endoscopic approach for his large volume nephrolithiasis of all stone free and he wished to proceed.  First stage being right ureteroscopy and left percutaneous nephrostolithotomy.  PROCEDURE IN DETAIL:  [name], was verified. Procedure being first stage right ureteroscopic stone manipulation, left percutaneous nephrostolithotomy was confirmed.  Procedure was carried out.  Time-out was performed.  Intravenous antibiotics were administered.  General endotracheal anesthesia was introduced via the patient's tracheostomy tract.  He was initially placed into a low- lithotomy position, and sterile field was created by prepping and draping the patient's penis, perineum, and proximal thighs using iodine x3.  Next, cystourethroscopy was performed using 22-French rigid cystoscope with 12-degree offset lens.  Inspection of the anterior and posterior urethra unremarkable.  Distal end of bilateral ureteral stents were seen in situ.  They were moderately encrusted considering the time they had been in.  Attention was then directed to the right side.  The right ureteral stent was grasped and brought out in its entirety and set aside for discard.  The right ureteral orifice was cannulated with a 6- French end-hole catheter and right retrograde pyelogram was obtained.  Right  retrograde pyelogram demonstrated a single right ureter and single- system left kidney.  There was massive hydroureteronephrosis towards several filling defects in all segments of the right ureter, single large volume nephrolithiasis.  A 0.038 Glidewire was advanced at the level of the upper pole and set aside as a safety wire.  Next, semi- rigid ureteroscopy was performed along with the right  ureter alongside a separate Sensor working wire.  An 8-French feeding tube was placed in the urinary bladder for pressure release.  There was massive volume of right ureteral stone at three different segments; right UPJ, right iliac crossing, and right midureter preceding in distal to proximal fashion. Laser lithotripsy was applied to the stones and a predominantly dusting technique followed by formation of fragments approximately 3-4 mm in diameter, that were then grasped with escape basket and brought out and set in the urinary bladder.  This completely ablated the distal most stone, mid-stone and approximately half of the most proximal stone and the proximal stone was at the upper limits of the semi-rigid ureteroscope, and given the capacious ureter, it was felt that fragmentation followed by interval time would allow these fragments to migrate more distally and allow for most efficient removal.  Finally, a new 6 x 26 double-J stent was placed over the remaining safety wire. Good proximal and distal curl were noted.  Next, the distal end of the left ureteral stent was grasped and was brought out in its entirety, set aside for discard.  The left ureteral orifice was cannulated with a 6- French end-hole catheter and left retrograde pyelogram was obtained.  Left retrograde pyelogram demonstrated a single left ureter and single- system left kidney.  There was multifocal proximal ureteral stones and large volume intrarenal stone with moderate hydronephrosis.  A 0.038 glidewire was advanced at the level of the upper pole, over which, the 6- French end-hole catheter was advanced to the level of the renal pelvis, and this was set aside as an externalized nephroureteral stent.  Next, 28-French resectoscope sheath was used to irrigate the previous ureteral fragments out of the urinary bladder.  These were set aside for compositional analysis.  An 18-French Foley catheter was placed per urethra,  10 mL of sterile water in the straight drain, and the open- ended nephroureteral stent was attached to this using Vicryl tie and iodinated contrast primed extension tubing was connected to this.  The patient was then completely repositioned into a prone position applying prone view chest rolls, axillary rolls, sequential compression devices, padding of his knees.  Exclusive care was taken to avoid impingement on his tracheostomy placed endotracheal tube.  Table was flexed just to further open the space of continuous twelfth rib and iliac crest.  This further freshened the operating table using 3-inch tape over foam padding.  A new sterile field was created by prepping and draping the patient's entire left flank using chlorhexidine gluconate and allowing to dry using fluoroscopy 20 degrees off center with deep inspiration and simultaneous retrograde pyelography to view the externalized stent, a suitable upper mid pole calyx was found for percutaneous access.  This site was chosen to allow a straight pass to the UPJ and ureter as well as this area of the kidney appeared to have suitable amount of parenchyma around it versus its lower pole being somewhat thin.  An [place] needle was then advanced using a bulls eye technique at this site directly into this upper calyx from the lateral orientation with removal of the introducer,  prompt uniniferous fluid efflux was noted.  The 0.038 glidewire was then successfully navigated down the level of the ureter, over which, the nephroureteral catheter was carefully advanced and exchanged for a superstiff wire.  The skin site was then further incised for total distance approximately 2 cm and dilated to the level of the external inferior renal fascia using fluoroscopically guided hemostat.  The coaxial dual-lumen introducer was then advanced to the level of proximal ureter and the 0.038 glidewire was once again advanced and exchanged for a second  superstiff wire via the KMP catheter.  Next, the percutaneous drape was applied, the coaxial dual-lumen introducer was removed, one of the superstiff wire was set aside as a safety wire.  Next, the 30-French NephroMax balloon dilation apparatus was carefully positioned across the upper pole calyx, inflated to a pressure of 18 atmospheres, held for 90 seconds and the sheath was carefully advanced across this calyx.  Rigid nephroscopy was then performed.  Rigid nephroscopy revealed excellent placement of the sheath within the desired upper pole calyx and then removal of the small stone fragments in this location.  Rigid graspers were then used to remove this area of stone.  Flexible nephroscopy was then performed, further verified excellent sheath placement into the appropriate calyx.  Using flexible nephroscopy, lower pole stones were repositioned to the straight in-line with the rigid tract and lithoclast dual ultrasound pneumatic energy was applied to the stones, fragmented into smaller fragments, which were then grasped with a rigid grasper and brought out in their entirety.  Inspection of the proximal ureter revealed several large fragments as well as via the flexible cystoscope and these were grasped and brought out in their entirety.  Following these maneuvers, it was felt that there was complete removal of all stone in the mid and lower pole as well as the proximal ureter on the left.  There was another upper pole calyx that was acutely angled to the sheath and due to this angulation, it was not easily accessible whatsoever from rigid technique and also with extreme difficulty using flexible technique, it was felt that a second stage procedure with the sheath not present that the access tract could provide access to this location as well as residual stones in the upper most pole calyx.  As such, decision was made to terminate this portion of the procedures today.  The patient had  also been approximately 2-1/2 hours in prone position, it was felt that staging would be needed for technical purposes as well as safety purposes.  Next, the KMP catheter was once again advanced over the [place] working wire to the level of the urinary bladder acting as a nephroureteral stent.  A separate Glidewire was advanced at the level of the proximal ureter, over which, a new 74- [place] nephrostomy tube was carefully positioned and then backed up to the level of the renal pelvis and deployed into adequate position.  The safety wire was removed after final antegrade nephrostogram was performed.  Final antegrade nephrostogram revealed excellent placement of the right nephrostomy tube in the renal pelvis.  There was no excessive extravasation.  There were no filling defects in the mid lower pole of the proximal ureter.  The sheath was removed.  Percutaneous dressing was applied.  The previous externalized left ureteral stent was also removed leaving only the Foley catheter, left nephroureteral stents and left nephrostomy tube in place with latter to being connected to straight drain.  Procedure was then terminated.  The patient tolerated the  procedure well.  There were no immediate periprocedural complications. The patient was taken to the postanesthesia care unit in stable condition.          ______________________________ Alexis Frock, MD     TM/MEDQ  D:  08/08/2013  T:  08/09/2013  Job:  [id number]

## 2013-08-09 NOTE — Progress Notes (Signed)
1 Day Post-Op  Subjective:  1 - Recurrent Surgical Nephrolithiasis - s/p right 1st stage ureteroscopic stone manipulation and left 1st stage percutaneous nephrostolithotomy 08/08/2013. Plan for second stage bilateral procedure 4/1 for huge volume bilateral nephrolithiasis.  2 - Renal Insuficiency - Baseline approx 1.0. Had acute rise to 6 during episode 06/2013 with bilateral obstructing stones. K acceptable during episode. Most recent Cr 1.75 06/2013 adn now 1.7 07/2013.  Today Dennis Spencer is doing well POD 1 s/p first stage stone surgery. Hgb stable, pain controlled, no nausea / emesis.   Objective: Vital signs in last 24 hours: Temp:  [97.4 F (36.3 C)-98.4 F (36.9 C)] 98.4 F (36.9 C) (03/31 0600) Pulse Rate:  [61-75] 66 (03/31 0609) Resp:  [11-18] 16 (03/31 0609) BP: (129-188)/(55-71) 132/60 mmHg (03/31 0600) SpO2:  [93 %-100 %] 93 % (03/31 0609) Weight:  [132.45 kg (292 lb)-133.7 kg (294 lb 12.1 oz)] 133.7 kg (294 lb 12.1 oz) (03/30 1836) Last BM Date: 08/07/13  Intake/Output from previous day: 03/30 0701 - 03/31 0700 In: 3405 [P.O.:120; I.V.:3285] Out: 1100 [Urine:1100] Intake/Output this shift:    General appearance: alert, cooperative and appears stated age Head: Normocephalic, without obvious abnormality, atraumatic Neck: trach in place as per baselin with PMV in place Back: symmetric, no curvature. ROM normal. No CVA tenderness. Resp: no labored breathing Cardio: regular rate GI: soft, non-tender; bowel sounds normal; no masses,  no organomegaly Male genitalia: normal, foley c/d/i with clearing moderate pink urine, no clots i tubign. Extremities: extremities normal, atraumatic, no cyanosis or edema Skin: Skin color, texture, turgor normal. No rashes or lesions Neurologic: Grossly normal Incision/Wound: left nephrostomy c/d/i with clearing urine.   Lab Results:   Recent Labs  08/08/13 1744 08/09/13 0432  HGB 11.7* 10.7*  HCT 33.9* 31.2*   BMET  Recent Labs   08/08/13 1744 08/09/13 0432  NA 140 137  K 4.5 4.6  CL 106 104  CO2 24 26  GLUCOSE 124* 115*  BUN 11 12  CREATININE 1.70* 1.70*  CALCIUM 8.9 8.3*   PT/INR No results found for this basename: LABPROT, INR,  in the last 72 hours ABG No results found for this basename: PHART, PCO2, PO2, HCO3,  in the last 72 hours  Studies/Results: Dg Abd 1 View  08/08/2013   ADDENDUM REPORT: 08/08/2013 18:11  ADDENDUM: PLEASE DISREGARD THE INITIAL REPORT. IMAGES FROM TWO SEPARATE PROCEDURES AND PATIENTS WERE INADVERTENTLY LOADED ONTO ONE STUDY. CORRECT REPORT BELOW>  CLINICAL DATA: Left percutaneous nephrostomy and percutaneous nephrolithotomy performed by Dr. Marlis Edelson: DG C-ARM 1-60 MIN - NRPT [facility]; ABDOMEN - 1 VIEW  TECHNIQUE: Intraoperative spot radiographs obtained and submitted for radiologic review  CONTRAST: Please see operative note for air further detail.  FLUOROSCOPY TIME: Please see operative note for further detail  COMPARISON: CT abdomen/ pelvis 06/18/2013  FINDINGS: Multiple intraoperative radiographs demonstrate percutaneous access of an interpolar calyx with a safety catheter in the bladder followed by subsequent tract dilatation and insertion of a percutaneous scope. On the final images there is a percutaneous nephrostomy tube placed in the renal pelvis with an additional safety catheter extending to the bladder. The renal collecting system is decompressed. Residual radiopaque material in an upper pole infundibulum and caliectasis may represent retained contrast or residual stones.  IMPRESSION: Left percutaneous nephrostomy and percutaneous nephrolithotomy.   Electronically Signed   By: Jacqulynn Cadet M.D.   On: 08/08/2013 18:11   08/08/2013   CLINICAL DATA:  Left percutaneous nephrostomy and percutaneous nephrolithotomy performed  by Dr. Marlis Edelson: DG C-ARM 1-60 MIN - NRPT [facility]; ABDOMEN - 1 VIEW  TECHNIQUE: Intraoperative spot radiographs obtained and submitted for radiologic review   CONTRAST:  Please see operative note for air further detail.  FLUOROSCOPY TIME:  Please see operative note for further detail  COMPARISON:  CT abdomen/ pelvis 06/18/2013  FINDINGS: Multiple intraoperative radiographs obtained during left percutaneous nephrolithotomy and cystoscopy. The initial cystoscopic images dated from 11 a.m. to 1:08 p.m. demonstrates catheterization of the bladder and retrograde opacification of the left and right renal collecting systems. The left kidney is hydronephrotic. There are numerous filling defects within the calices consistent with nephrolithiasis.  Additional images dated 2:22 p.m. through 3:57 p.m. demonstrate percutaneous access of an interpolar calyx with a safety catheter in the bladder followed by subsequent tract dilatation and insertion of a percutaneous ago. On the final images there is a percutaneous nephrostomy tube placed in the renal pelvis with an additional 60 catheter extending to the bladder. There also appears to be a second catheter which may represent a double-J stent from the bladder to the proximal ureter.  IMPRESSION: Cystoscopy with bilateral retrograde ureteral pyelogram followed by left percutaneous nephrostomy and percutaneous nephrolithotomy.  Electronically Signed: By: Jacqulynn Cadet M.D. On: 08/08/2013 17:38   Dg Retrograde Pyelogram  08/08/2013   CLINICAL DATA:  Bilateral study.  Stent disease.  EXAM: RETROGRADE PYELOGRAM  COMPARISON:  09/19/2009  FINDINGS: Multiple images are provided. No right left marking is provided. Studies shows placement of bilateral double-J ureteral stents. Dilated renal collecting systems in ureters bilaterally with multiple filling defects consistent with multiple stones.  IMPRESSION: No right left marking. Bilateral stents placed. Bilateral dilated symptoms with multiple stones.   Electronically Signed   By: Nelson Chimes M.D.   On: 08/08/2013 19:06   Dg C-arm 1-60 Min-no Report  08/08/2013   ADDENDUM REPORT:  08/08/2013 18:11  ADDENDUM: PLEASE DISREGARD THE INITIAL REPORT. IMAGES FROM TWO SEPARATE PROCEDURES AND PATIENTS WERE INADVERTENTLY LOADED ONTO ONE STUDY. CORRECT REPORT BELOW>  CLINICAL DATA: Left percutaneous nephrostomy and percutaneous nephrolithotomy performed by Dr. Marlis Edelson: DG C-ARM 1-60 MIN - NRPT [facility]; ABDOMEN - 1 VIEW  TECHNIQUE: Intraoperative spot radiographs obtained and submitted for radiologic review  CONTRAST: Please see operative note for air further detail.  FLUOROSCOPY TIME: Please see operative note for further detail  COMPARISON: CT abdomen/ pelvis 06/18/2013  FINDINGS: Multiple intraoperative radiographs demonstrate percutaneous access of an interpolar calyx with a safety catheter in the bladder followed by subsequent tract dilatation and insertion of a percutaneous scope. On the final images there is a percutaneous nephrostomy tube placed in the renal pelvis with an additional safety catheter extending to the bladder. The renal collecting system is decompressed. Residual radiopaque material in an upper pole infundibulum and caliectasis may represent retained contrast or residual stones.  IMPRESSION: Left percutaneous nephrostomy and percutaneous nephrolithotomy.   Electronically Signed   By: Jacqulynn Cadet M.D.   On: 08/08/2013 18:11   08/08/2013   CLINICAL DATA:  Left percutaneous nephrostomy and percutaneous nephrolithotomy performed by Dr. Marlis Edelson: DG C-ARM 1-60 MIN - NRPT [facility]; ABDOMEN - 1 VIEW  TECHNIQUE: Intraoperative spot radiographs obtained and submitted for radiologic review  CONTRAST:  Please see operative note for air further detail.  FLUOROSCOPY TIME:  Please see operative note for further detail  COMPARISON:  CT abdomen/ pelvis 06/18/2013  FINDINGS: Multiple intraoperative radiographs obtained during left percutaneous nephrolithotomy and cystoscopy. The initial cystoscopic images dated  from 11 a.m. to 1:08 p.m. demonstrates catheterization of the bladder and  retrograde opacification of the left and right renal collecting systems. The left kidney is hydronephrotic. There are numerous filling defects within the calices consistent with nephrolithiasis.  Additional images dated 2:22 p.m. through 3:57 p.m. demonstrate percutaneous access of an interpolar calyx with a safety catheter in the bladder followed by subsequent tract dilatation and insertion of a percutaneous ago. On the final images there is a percutaneous nephrostomy tube placed in the renal pelvis with an additional 60 catheter extending to the bladder. There also appears to be a second catheter which may represent a double-J stent from the bladder to the proximal ureter.  IMPRESSION: Cystoscopy with bilateral retrograde ureteral pyelogram followed by left percutaneous nephrostomy and percutaneous nephrolithotomy.  Electronically Signed: By: Jacqulynn Cadet M.D. On: 08/08/2013 17:38    Anti-infectives: Anti-infectives   Start     Dose/Rate Route Frequency Ordered Stop   08/08/13 0600  gentamicin (GARAMYCIN) 480 mg in dextrose 5 % 100 mL IVPB     480 mg 224 mL/hr over 30 Minutes Intravenous 30 min pre-op 08/07/13 1324 08/08/13 1147      Assessment/Plan:  1 - Recurrent Surgical Nephrolithiasis - NPO p MN tonight for second stage procedure tomorrow with goal of stone free. Ambulate today, consent.   2 - Renal Insuficiency - GFR stable.   [facility], Dennis Spencer 08/09/2013

## 2013-08-09 NOTE — Care Management Note (Addendum)
    Page 1 of 1   08/11/2013     3:04:02 PM   CARE MANAGEMENT NOTE 08/11/2013  Patient:  Dennis Spencer, Dennis Spencer   Account Number:  [id number]  Date Initiated:  08/09/2013  Documentation initiated by:  Dessa Phi  Subjective/Objective Assessment:   68 Y/O M ADMITTED W/RECURRENT NEPHROLITHIASIS.     Action/Plan:   FROM HOME.HAS PCP,PHARMACY.   Anticipated DC Date:  08/11/2013   Anticipated DC Plan:  [place]  CM consult      Choice offered to / List presented to:             Status of service:  Completed, signed off Medicare Important Message given?   (If response is "NO", the following Medicare IM given date fields will be blank) Date Medicare IM given:   Date Additional Medicare IM given:    Discharge Disposition:  HOME/SELF CARE  Per UR Regulation:  Reviewed for med. necessity/level of care/duration of stay  If discussed at [place] of Stay Meetings, dates discussed:    Comments:  08/11/13 Emit Kuenzel RN,BSN NCM [phone] D/C HOME NO [place].  08/09/13 Diamond Martucci RN,BSN NCM [phone] POD#11ST STAGE PERC NEPHROSTOLITHOTOMY.FOR 2ND STAGE SX IN AM.NO ANTICIPATED D/C NEEDS.

## 2013-08-09 NOTE — Progress Notes (Signed)
Patient ambulated in hallway several times today >200 feet independently. Tolerated well. Will continue to monitor patient. [place], Marchelle Folks

## 2013-08-09 NOTE — Progress Notes (Signed)
ANTIBIOTIC CONSULT NOTE - INITIAL  Pharmacy Consult for Gentamicin Indication: surgical prophylaxis  No Known Allergies  Patient Measurements: Height: 5\' 10"  (177.8 cm) Weight: 294 lb 12.1 oz (133.7 kg) IBW/kg (Calculated) : 73 Adjusted Body Weight: 97 kg  Vital Signs: Temp: 98.4 F (36.9 C) (03/31 0600) Temp src: Oral (03/31 0600) BP: 132/60 mmHg (03/31 0600) Pulse Rate: 68 (03/31 0842) Intake/Output from previous day: 03/30 0701 - 03/31 0700 In: 3405 [P.O.:120; I.V.:3285] Out: 1100 [Urine:1100] Intake/Output from this shift: Total I/O In: 240 [P.O.:240] Out: -   Labs:  Recent Labs  08/08/13 1744 08/09/13 0432  HGB 11.7* 10.7*  CREATININE 1.70* 1.70*   Estimated Creatinine Clearance: 58.8 ml/min (by C-G formula based on Cr of 1.7). No results found for this basename: VANCOTROUGH, VANCOPEAK, VANCORANDOM, GENTTROUGH, GENTPEAK, GENTRANDOM, TOBRATROUGH, TOBRAPEAK, TOBRARND, AMIKACINPEAK, AMIKACINTROU, AMIKACIN,  in the last 72 hours   Microbiology: No results found for this or any previous visit (from the past 720 hour(s)).  Medical History: Past Medical History  Diagnosis Date  . Kidney stones   . Hypertension   . Hypercholesteremia   . Sleep apnea   . Osteomyelitis   . 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  Marland Kitchen GERD (gastroesophageal reflux disease)      Assessment: 62 yoM with recurrent surgical nephrolithiasis s/p right 1st stage ureteroscopic stone manipulation and left 1st stage percutaneous nephrostolithotomy 08/08/2013. Plan for second stage bilateral procedure 4/1 for huge volume bilateral nephrolithiasis.  Pharmacy consulted to dose gentamicin for surgical prophylaxis for procedure tomorrow scheduled at 1145.  Patient received gentamicin 480 mg pre-op 3/30.  Will use adjusted body weight of 97 kg for dosing since  BMI>30.  SCr 1.70, CrCl~58 ml/min.  Goal of Therapy:  surgical prophylaxis  Plan:  Gentamicin 480 mg (~5 mg/kg of adjusted body weight) IV x 1 given 30 minutes pre-procedure tomorrow.  Hershal Coria 08/09/2013,10:00 AM

## 2013-08-10 ENCOUNTER — Inpatient Hospital Stay (HOSPITAL_COMMUNITY): Payer: Medicare HMO | Admitting: Anesthesiology

## 2013-08-10 ENCOUNTER — Inpatient Hospital Stay (HOSPITAL_COMMUNITY): Payer: Medicare HMO

## 2013-08-10 ENCOUNTER — Inpatient Hospital Stay (HOSPITAL_COMMUNITY): Admission: RE | Admit: 2013-08-10 | Payer: Medicare HMO | Source: Ambulatory Visit | Admitting: Urology

## 2013-08-10 ENCOUNTER — Encounter (HOSPITAL_COMMUNITY): Payer: Self-pay | Admitting: Anesthesiology

## 2013-08-10 ENCOUNTER — Encounter (HOSPITAL_COMMUNITY): Payer: Medicare HMO | Admitting: Anesthesiology

## 2013-08-10 ENCOUNTER — Encounter (HOSPITAL_COMMUNITY)
Admission: RE | Disposition: A | Discharge status: Home / Self Care | Payer: Self-pay | Source: Ambulatory Visit | Attending: Urology

## 2013-08-10 HISTORY — PX: CYSTOSCOPY WITH RETROGRADE PYELOGRAM, URETEROSCOPY AND STENT PLACEMENT: SHX5789

## 2013-08-10 HISTORY — PX: HOLMIUM LASER APPLICATION: SHX5852

## 2013-08-10 HISTORY — PX: NEPHROLITHOTOMY: SHX5134

## 2013-08-10 LAB — SURGICAL PCR SCREEN
MRSA, PCR: NEGATIVE
Staphylococcus aureus: NEGATIVE

## 2013-08-10 SURGERY — NEPHROLITHOTOMY PERCUTANEOUS SECOND LOOK
Anesthesia: General | Site: Ureter | Laterality: Right

## 2013-08-10 MED ORDER — MIDAZOLAM HCL 5 MG/5ML IJ SOLN
INTRAMUSCULAR | Status: DC | PRN
  Administered 2013-08-10: 2 mg via INTRAVENOUS

## 2013-08-10 MED ORDER — NEOSTIGMINE METHYLSULFATE 1 MG/ML IJ SOLN
INTRAMUSCULAR | Status: DC | PRN
  Administered 2013-08-10: 5 mg via INTRAVENOUS

## 2013-08-10 MED ORDER — SODIUM CHLORIDE 0.9 % IR SOLN
Status: DC | PRN
  Administered 2013-08-10: 3000 mL

## 2013-08-10 MED ORDER — LACTATED RINGERS IV SOLN
INTRAVENOUS | Status: DC | PRN
  Administered 2013-08-10: 12:00:00 via INTRAVENOUS

## 2013-08-10 MED ORDER — IOHEXOL 300 MG/ML  SOLN
INTRAMUSCULAR | Status: DC | PRN
  Administered 2013-08-10: 55 mL

## 2013-08-10 MED ORDER — ROCURONIUM BROMIDE 100 MG/10ML IV SOLN
INTRAVENOUS | Status: DC | PRN
  Administered 2013-08-10: 50 mg via INTRAVENOUS
  Administered 2013-08-10: 30 mg via INTRAVENOUS

## 2013-08-10 MED ORDER — KCL IN DEXTROSE-NACL 10-5-0.45 MEQ/L-%-% IV SOLN
INTRAVENOUS | Status: DC
  Administered 2013-08-10 – 2013-08-11 (×3): via INTRAVENOUS
  Filled 2013-08-10 (×2): qty 1000

## 2013-08-10 MED ORDER — GLYCOPYRROLATE 0.2 MG/ML IJ SOLN
INTRAMUSCULAR | Status: DC | PRN
  Administered 2013-08-10: .8 mg via INTRAVENOUS

## 2013-08-10 MED ORDER — FENTANYL CITRATE 0.05 MG/ML IJ SOLN
INTRAMUSCULAR | Status: DC | PRN
  Administered 2013-08-10 (×2): 25 ug via INTRAVENOUS
  Administered 2013-08-10 (×3): 50 ug via INTRAVENOUS

## 2013-08-10 MED ORDER — DEXAMETHASONE SODIUM PHOSPHATE 10 MG/ML IJ SOLN
INTRAMUSCULAR | Status: DC | PRN
  Administered 2013-08-10: 10 mg via INTRAVENOUS

## 2013-08-10 MED ORDER — 0.9 % SODIUM CHLORIDE (POUR BTL) OPTIME
TOPICAL | Status: DC | PRN
  Administered 2013-08-10: 1000 mL

## 2013-08-10 MED ORDER — ONDANSETRON HCL 4 MG/2ML IJ SOLN
INTRAMUSCULAR | Status: DC | PRN
  Administered 2013-08-10: 4 mg via INTRAVENOUS

## 2013-08-10 MED ORDER — LACTATED RINGERS IV SOLN
INTRAVENOUS | Status: DC
  Administered 2013-08-10: 1000 mL via INTRAVENOUS

## 2013-08-10 MED ORDER — LIDOCAINE HCL (CARDIAC) 20 MG/ML IV SOLN
INTRAVENOUS | Status: DC | PRN
  Administered 2013-08-10: 100 mg via INTRAVENOUS

## 2013-08-10 MED ORDER — SODIUM CHLORIDE 0.9 % IR SOLN
Status: DC | PRN
  Administered 2013-08-10: 4000 mL

## 2013-08-10 MED ORDER — HYDROMORPHONE HCL PF 1 MG/ML IJ SOLN: 0.2500 mg | INTRAMUSCULAR | Status: DC | PRN

## 2013-08-10 MED ORDER — PROMETHAZINE HCL 25 MG/ML IJ SOLN: 6.2500 mg | INTRAMUSCULAR | Status: DC | PRN

## 2013-08-10 MED ORDER — TAMSULOSIN HCL 0.4 MG PO CAPS
0.4000 mg | ORAL_CAPSULE | Freq: Every day | ORAL | Status: DC
  Administered 2013-08-10 – 2013-08-11 (×2): 0.4 mg via ORAL
  Filled 2013-08-10 (×3): qty 1

## 2013-08-10 MED ORDER — PROPOFOL 10 MG/ML IV BOLUS
INTRAVENOUS | Status: DC | PRN
Start: 2013-08-10 — End: 2013-08-10
  Administered 2013-08-10: 200 mg via INTRAVENOUS

## 2013-08-10 SURGICAL SUPPLY — 67 items
APL SKNCLS STERI-STRIP NONHPOA (GAUZE/BANDAGES/DRESSINGS) ×2
BAG URINE DRAINAGE (UROLOGICAL SUPPLIES) ×1 IMPLANT
BASKET LASER NITINOL 1.9FR (BASKET) IMPLANT
BASKET STNLS GEMINI 4WIRE 3FR (BASKET) IMPLANT
BASKET ZERO TIP 1.9FR (BASKET) ×6 IMPLANT
BASKET ZERO TIP NITINOL 2.4FR (BASKET) ×6 IMPLANT
BENZOIN TINCTURE PRP APPL 2/3 (GAUZE/BANDAGES/DRESSINGS) ×10 IMPLANT
BLADE SURG 15 STRL LF DISP TIS (BLADE) ×2 IMPLANT
BLADE SURG 15 STRL SS (BLADE) ×4
BSKT STON RTRVL 120 1.9FR (BASKET)
BSKT STON RTRVL GEM 120X11 3FR (BASKET)
BSKT STON RTRVL ZERO TP 1.9FR (BASKET) ×4
BSKT STON RTRVL ZERO TP 2.4FR (BASKET) ×4
CATCHER STONE W/TUBE ADAPTER (UROLOGICAL SUPPLIES) ×2 IMPLANT
CATH FOLEY 2W COUNCIL 20FR 5CC (CATHETERS) IMPLANT
CATH INTERMIT  6FR 70CM (CATHETERS) ×4 IMPLANT
CATH ROBINSON RED A/P 20FR (CATHETERS) IMPLANT
CATH TIEMANN FOLEY 18FR 5CC (CATHETERS) ×3 IMPLANT
CATH X-FORCE N30 NEPHROSTOMY (TUBING) ×2 IMPLANT
CLOTH BEACON ORANGE TIMEOUT ST (SAFETY) ×6 IMPLANT
COVER SURGICAL LIGHT HANDLE (MISCELLANEOUS) ×4 IMPLANT
DRAPE C-ARM 42X120 X-RAY (DRAPES) ×4 IMPLANT
DRAPE CAMERA CLOSED 9X96 (DRAPES) ×4 IMPLANT
DRAPE LINGEMAN PERC (DRAPES) ×4 IMPLANT
DRAPE SURG IRRIG POUCH 19X23 (DRAPES) ×4 IMPLANT
DRSG TEGADERM 4X4.75 (GAUZE/BANDAGES/DRESSINGS) ×2 IMPLANT
DRSG TEGADERM 8X12 (GAUZE/BANDAGES/DRESSINGS) ×2 IMPLANT
ELECT REM PT RETURN 9FT ADLT (ELECTROSURGICAL)
ELECTRODE REM PT RTRN 9FT ADLT (ELECTROSURGICAL) IMPLANT
FIBER LASER FLEXIVA 200 (UROLOGICAL SUPPLIES) ×4 IMPLANT
FIBER LASER FLEXIVA 365 (UROLOGICAL SUPPLIES) IMPLANT
FIBER LASER FLEXIVA 550 (UROLOGICAL SUPPLIES) IMPLANT
GLOVE BIOGEL M STRL SZ7.5 (GLOVE) ×7 IMPLANT
GOWN STRL REUS W/TWL LRG LVL3 (GOWN DISPOSABLE) ×10 IMPLANT
GOWN STRL REUS W/TWL XL LVL3 (GOWN DISPOSABLE) ×2 IMPLANT
GUIDEWIRE ANG ZIPWIRE 038X150 (WIRE) ×4 IMPLANT
GUIDEWIRE STR DUAL SENSOR (WIRE) ×4 IMPLANT
IV NS IRRIG 3000ML ARTHROMATIC (IV SOLUTION) ×10 IMPLANT
KIT BASIN OR (CUSTOM PROCEDURE TRAY) ×4 IMPLANT
LASER FIBER DISP 1000U (UROLOGICAL SUPPLIES) IMPLANT
MANIFOLD NEPTUNE II (INSTRUMENTS) ×4 IMPLANT
NS IRRIG 1000ML POUR BTL (IV SOLUTION) ×7 IMPLANT
PACK BASIC VI WITH GOWN DISP (CUSTOM PROCEDURE TRAY) ×4 IMPLANT
PACK CYSTO (CUSTOM PROCEDURE TRAY) ×4 IMPLANT
PAD ABD 7.5X8 STRL (GAUZE/BANDAGES/DRESSINGS) ×2 IMPLANT
PROBE LITHOCLAST ULTRA 3.8X403 (UROLOGICAL SUPPLIES) ×2 IMPLANT
PROBE PNEUMATIC 1.0MMX570MM (UROLOGICAL SUPPLIES) ×2 IMPLANT
SET IRRIG Y TYPE TUR BLADDER L (SET/KITS/TRAYS/PACK) ×4 IMPLANT
SET WARMING FLUID IRRIGATION (MISCELLANEOUS) ×2 IMPLANT
SHEATH ACCESS URETERAL 38CM (SHEATH) ×3 IMPLANT
SPONGE GAUZE 4X4 12PLY (GAUZE/BANDAGES/DRESSINGS) ×4 IMPLANT
SPONGE LAP 4X18 X RAY DECT (DISPOSABLE) ×4 IMPLANT
STENT CONTOUR 6FRX26X.038 (STENTS) ×4 IMPLANT
STONE CATCHER W/TUBE ADAPTER (UROLOGICAL SUPPLIES) IMPLANT
SURGIFLO W/THROMBIN 8M KIT (HEMOSTASIS) ×3 IMPLANT
SUT SILK 2 0 30  PSL (SUTURE)
SUT SILK 2 0 30 PSL (SUTURE) ×1 IMPLANT
SUT VIC AB 3-0 PS2 18 (SUTURE) ×4
SUT VIC AB 3-0 PS2 18XBRD (SUTURE) IMPLANT
SYR 20CC LL (SYRINGE) ×6 IMPLANT
SYRINGE 10CC LL (SYRINGE) ×4 IMPLANT
SYRINGE IRR TOOMEY STRL 70CC (SYRINGE) IMPLANT
TOWEL OR NON WOVEN STRL DISP B (DISPOSABLE) ×2 IMPLANT
TRAY FOLEY CATH 14FRSI W/METER (CATHETERS) ×2 IMPLANT
TUBE FEEDING 8FR 16IN STR KANG (MISCELLANEOUS) ×4 IMPLANT
TUBING CONNECTING 10 (TUBING) ×7 IMPLANT
TUBING CONNECTING 10' (TUBING) ×2

## 2013-08-10 NOTE — Progress Notes (Signed)
2 Days Post-Op   Subjective:  1 - Recurrent Surgical Nephrolithiasis - s/p right 1st stage ureteroscopic stone manipulation and left 1st stage percutaneous nephrostolithotomy 08/08/2013. Plan for second stage bilateral procedure 4/1 for huge volume bilateral nephrolithiasis.  2 - Renal Insuficiency - Baseline approx 1.0. Had acute rise to 6 during episode 06/2013 with bilateral obstructing stones. K acceptable during episode. Most recent Cr 1.75 06/2013 adn now 1.7 07/2013.  Today Dennis Spencer is without complaints. He is NPO for second stage surgery scheduled later today. No fevers overnight. Urine continues to clear.  Objective: Vital signs in last 24 hours: Temp:  [97.3 F (36.3 C)-98.8 F (37.1 C)] 98.7 F (37.1 C) (04/01 0523) Pulse Rate:  [64-77] 65 (04/01 0523) Resp:  [16-18] 16 (04/01 0523) BP: (152-158)/(58-72) 152/72 mmHg (04/01 0523) SpO2:  [94 %-97 %] 97 % (04/01 0523) Last BM Date: 08/07/13  Intake/Output from previous day: 03/31 0701 - 04/01 0700 In: 1995 [P.O.:720; I.V.:1275] Out: 4325 [Urine:4325] Intake/Output this shift: Total I/O In: 450 [I.V.:450] Out: 2025 [Urine:2025]  General appearance: alert, cooperative and appears stated age Head: Normocephalic, without obvious abnormality, atraumatic Throat: lips, mucosa, and tongue normal; teeth and gums normal Back: symmetric, no curvature. ROM normal. No CVA tenderness. Resp: no labored breathing Cardio: Nl rate GI: soft, non-tender; bowel sounds normal; no masses,  no organomegaly Male genitalia: normal, Foley c/d/i with nearly clear urine, left nepht ueb c/d/i with nearly clear urine.  Extremities: extremities normal, atraumatic, no cyanosis or edema Pulses: 2+ and symmetric Skin: Skin color, texture, turgor normal. No rashes or lesions Lymph nodes: Cervical, supraclavicular, and axillary nodes normal. Neurologic: Grossly normal  Lab Results:   Recent Labs  08/08/13 1744 08/09/13 0432  HGB 11.7* 10.7*  HCT  33.9* 31.2*   BMET  Recent Labs  08/08/13 1744 08/09/13 0432  NA 140 137  K 4.5 4.6  CL 106 104  CO2 24 26  GLUCOSE 124* 115*  BUN 11 12  CREATININE 1.70* 1.70*  CALCIUM 8.9 8.3*   PT/INR No results found for this basename: LABPROT, INR,  in the last 72 hours ABG No results found for this basename: PHART, PCO2, PO2, HCO3,  in the last 72 hours  Studies/Results: Dg Abd 1 View  08/08/2013   ADDENDUM REPORT: 08/08/2013 18:11  ADDENDUM: PLEASE DISREGARD THE INITIAL REPORT. IMAGES FROM TWO SEPARATE PROCEDURES AND PATIENTS WERE INADVERTENTLY LOADED ONTO ONE STUDY. CORRECT REPORT BELOW>  CLINICAL DATA: Left percutaneous nephrostomy and percutaneous nephrolithotomy performed by Dr. Marlis Edelson: DG C-ARM 1-60 MIN - NRPT [facility]; ABDOMEN - 1 VIEW  TECHNIQUE: Intraoperative spot radiographs obtained and submitted for radiologic review  CONTRAST: Please see operative note for air further detail.  FLUOROSCOPY TIME: Please see operative note for further detail  COMPARISON: CT abdomen/ pelvis 06/18/2013  FINDINGS: Multiple intraoperative radiographs demonstrate percutaneous access of an interpolar calyx with a safety catheter in the bladder followed by subsequent tract dilatation and insertion of a percutaneous scope. On the final images there is a percutaneous nephrostomy tube placed in the renal pelvis with an additional safety catheter extending to the bladder. The renal collecting system is decompressed. Residual radiopaque material in an upper pole infundibulum and caliectasis may represent retained contrast or residual stones.  IMPRESSION: Left percutaneous nephrostomy and percutaneous nephrolithotomy.   Electronically Signed   By: Jacqulynn Cadet M.D.   On: 08/08/2013 18:11   08/08/2013   CLINICAL DATA:  Left percutaneous nephrostomy and percutaneous nephrolithotomy performed by Dr. Tresa Moore  EXAM: DG C-ARM 1-60 MIN - NRPT [facility]; ABDOMEN - 1 VIEW  TECHNIQUE: Intraoperative spot radiographs obtained and  submitted for radiologic review  CONTRAST:  Please see operative note for air further detail.  FLUOROSCOPY TIME:  Please see operative note for further detail  COMPARISON:  CT abdomen/ pelvis 06/18/2013  FINDINGS: Multiple intraoperative radiographs obtained during left percutaneous nephrolithotomy and cystoscopy. The initial cystoscopic images dated from 11 a.m. to 1:08 p.m. demonstrates catheterization of the bladder and retrograde opacification of the left and right renal collecting systems. The left kidney is hydronephrotic. There are numerous filling defects within the calices consistent with nephrolithiasis.  Additional images dated 2:22 p.m. through 3:57 p.m. demonstrate percutaneous access of an interpolar calyx with a safety catheter in the bladder followed by subsequent tract dilatation and insertion of a percutaneous ago. On the final images there is a percutaneous nephrostomy tube placed in the renal pelvis with an additional 60 catheter extending to the bladder. There also appears to be a second catheter which may represent a double-J stent from the bladder to the proximal ureter.  IMPRESSION: Cystoscopy with bilateral retrograde ureteral pyelogram followed by left percutaneous nephrostomy and percutaneous nephrolithotomy.  Electronically Signed: By: Jacqulynn Cadet M.D. On: 08/08/2013 17:38   Dg Retrograde Pyelogram  08/08/2013   CLINICAL DATA:  Bilateral study.  Stent disease.  EXAM: RETROGRADE PYELOGRAM  COMPARISON:  09/19/2009  FINDINGS: Multiple images are provided. No right left marking is provided. Studies shows placement of bilateral double-J ureteral stents. Dilated renal collecting systems in ureters bilaterally with multiple filling defects consistent with multiple stones.  IMPRESSION: No right left marking. Bilateral stents placed. Bilateral dilated symptoms with multiple stones.   Electronically Signed   By: Nelson Chimes M.D.   On: 08/08/2013 19:06   Dg C-arm 1-60 Min-no  Report  08/08/2013   ADDENDUM REPORT: 08/08/2013 18:11  ADDENDUM: PLEASE DISREGARD THE INITIAL REPORT. IMAGES FROM TWO SEPARATE PROCEDURES AND PATIENTS WERE INADVERTENTLY LOADED ONTO ONE STUDY. CORRECT REPORT BELOW>  CLINICAL DATA: Left percutaneous nephrostomy and percutaneous nephrolithotomy performed by Dr. Marlis Edelson: DG C-ARM 1-60 MIN - NRPT [facility]; ABDOMEN - 1 VIEW  TECHNIQUE: Intraoperative spot radiographs obtained and submitted for radiologic review  CONTRAST: Please see operative note for air further detail.  FLUOROSCOPY TIME: Please see operative note for further detail  COMPARISON: CT abdomen/ pelvis 06/18/2013  FINDINGS: Multiple intraoperative radiographs demonstrate percutaneous access of an interpolar calyx with a safety catheter in the bladder followed by subsequent tract dilatation and insertion of a percutaneous scope. On the final images there is a percutaneous nephrostomy tube placed in the renal pelvis with an additional safety catheter extending to the bladder. The renal collecting system is decompressed. Residual radiopaque material in an upper pole infundibulum and caliectasis may represent retained contrast or residual stones.  IMPRESSION: Left percutaneous nephrostomy and percutaneous nephrolithotomy.   Electronically Signed   By: Jacqulynn Cadet M.D.   On: 08/08/2013 18:11   08/08/2013   CLINICAL DATA:  Left percutaneous nephrostomy and percutaneous nephrolithotomy performed by Dr. Marlis Edelson: DG C-ARM 1-60 MIN - NRPT [facility]; ABDOMEN - 1 VIEW  TECHNIQUE: Intraoperative spot radiographs obtained and submitted for radiologic review  CONTRAST:  Please see operative note for air further detail.  FLUOROSCOPY TIME:  Please see operative note for further detail  COMPARISON:  CT abdomen/ pelvis 06/18/2013  FINDINGS: Multiple intraoperative radiographs obtained during left percutaneous nephrolithotomy and cystoscopy. The initial cystoscopic images dated from 11 a.m. to  1:08 p.m. demonstrates  catheterization of the bladder and retrograde opacification of the left and right renal collecting systems. The left kidney is hydronephrotic. There are numerous filling defects within the calices consistent with nephrolithiasis.  Additional images dated 2:22 p.m. through 3:57 p.m. demonstrate percutaneous access of an interpolar calyx with a safety catheter in the bladder followed by subsequent tract dilatation and insertion of a percutaneous ago. On the final images there is a percutaneous nephrostomy tube placed in the renal pelvis with an additional 60 catheter extending to the bladder. There also appears to be a second catheter which may represent a double-J stent from the bladder to the proximal ureter.  IMPRESSION: Cystoscopy with bilateral retrograde ureteral pyelogram followed by left percutaneous nephrostomy and percutaneous nephrolithotomy.  Electronically Signed: By: Jacqulynn Cadet M.D. On: 08/08/2013 17:38    Anti-infectives: Anti-infectives   Start     Dose/Rate Route Frequency Ordered Stop   08/10/13 1100  gentamicin (GARAMYCIN) 480 mg in dextrose 5 % 100 mL IVPB     480 mg 112 mL/hr over 60 Minutes Intravenous 30 min pre-op 08/09/13 1010     08/08/13 0600  gentamicin (GARAMYCIN) 480 mg in dextrose 5 % 100 mL IVPB     480 mg 224 mL/hr over 30 Minutes Intravenous 30 min pre-op 08/07/13 1324 08/08/13 1147      Assessment/Plan:  1 - Recurrent Surgical Nephrolithiasis - proceed as planned with second stage procedure today.   2 - Renal Insuficiency - Cr stable by serum markers. Appreciate pharmacy help with ABX weigh-based, GFR based dosing.    [facility], Olin Gurski 08/10/2013

## 2013-08-10 NOTE — Progress Notes (Signed)
Trach CK done at this time.Vitals stable, trach capped,no protected barrier,no skin breakdown.Pt doing will at this time.

## 2013-08-10 NOTE — Progress Notes (Signed)
Trach CK not done. Pt in procedure not on floor at this time.

## 2013-08-10 NOTE — Transfer of Care (Signed)
Immediate Anesthesia Transfer of Care Note  Patient: Dennis Spencer  Procedure(s) Performed: Procedure(s): NEPHROLITHOTOMY PERCUTANEOUS SECOND LOOK/LEFT DIGITAL URETEROSCOPY/BASKETING OF STONE/EXCHANGE OF LEFT URETERAL STENT (Left) 2ND STAGE CYSTOSCOPY WITH RETROGRADE PYELOGRAM, URETEROSCOPY AND STENT EXCHANGE (Right) HOLMIUM LASER APPLICATION (Left)  Patient Location: PACU  Anesthesia Type:General  Level of Consciousness: awake, alert , oriented and patient cooperative  Airway & Oxygen Therapy: Patient Spontanous Breathing and Patient connected to face mask oxygen  Post-op Assessment: Report given to PACU RN, Post -op Vital signs reviewed and stable and Patient moving all extremities  Post vital signs: Reviewed and stable  Complications: No apparent anesthesia complications

## 2013-08-10 NOTE — Anesthesia Preprocedure Evaluation (Signed)
Anesthesia Evaluation  Patient identified by MRN, date of birth, ID band Patient awake    Reviewed: Allergy & Precautions, H&P , NPO status , Patient's Chart, lab work & pertinent test results  Airway Mallampati: II TM Distance: >3 FB Neck ROM: Full   Comment: S/P Tracheostomy  Dental no notable dental hx.    Pulmonary sleep apnea , former smoker,  breath sounds clear to auscultation  Pulmonary exam normal       Cardiovascular hypertension, Pt. on medications Rhythm:Regular Rate:Normal     Neuro/Psych negative neurological ROS  negative psych ROS   GI/Hepatic Neg liver ROS, GERD-  Medicated,  Endo/Other  Morbid obesity  Renal/GU Renal disease  negative genitourinary   Musculoskeletal negative musculoskeletal ROS (+)   Abdominal (+) + obese,   Peds negative pediatric ROS (+)  Hematology  (+) Blood dyscrasia, ,   Anesthesia Other Findings   Reproductive/Obstetrics negative OB ROS                           Anesthesia Physical Anesthesia Plan  ASA: III  Anesthesia Plan: General   Post-op Pain Management:    Induction: Intravenous  Airway Management Planned:   Additional Equipment:   Intra-op Plan:   Post-operative Plan: Extubation in OR  Informed Consent: I have reviewed the patients History and Physical, chart, labs and discussed the procedure including the risks, benefits and alternatives for the proposed anesthesia with the patient or authorized representative who has indicated his/her understanding and acceptance.   Dental advisory given  Plan Discussed with: CRNA  Anesthesia Plan Comments: (ETT in tracheostomy)        Anesthesia Quick Evaluation

## 2013-08-10 NOTE — Brief Op Note (Signed)
08/08/2013 - 08/10/2013  5:17 PM  PATIENT:  [name]  68 y.o. male  PRE-OPERATIVE DIAGNOSIS:  LEFT STAGHORN, RIGHT LARGE URETERAL STONE  POST-OPERATIVE DIAGNOSIS:  LEFT STAGHORN, RIGHT LARGE URETERAL STONE  PROCEDURE:  Procedure(s): NEPHROLITHOTOMY PERCUTANEOUS SECOND LOOK (Left) 2ND STAGE CYSTOSCOPY WITH RETROGRADE PYELOGRAM, URETEROSCOPY AND STENT EXCHANGE (Right) HOLMIUM LASER APPLICATION (Left)  SURGEON:  Surgeon(s) and Role:    * Alexis Frock, MD - Primary  PHYSICIAN ASSISTANT:   ASSISTANTS: none   ANESTHESIA:   general  EBL:  Total I/O In: 1300 [I.V.:1300] Out: 775 [Urine:750; Blood:25]  BLOOD ADMINISTERED:none  DRAINS: foley to straight drain   LOCAL MEDICATIONS USED:  NONE  SPECIMEN:  Source of Specimen:  Rt ureteral, Lt renal and ureteral stones  DISPOSITION OF SPECIMEN:  Given to patient  COUNTS:  YES  TOURNIQUET:  * No tourniquets in log *  DICTATION: .Other Dictation: Dictation Number [id number]  PLAN OF CARE: Admit to inpatient   PATIENT DISPOSITION:  PACU - hemodynamically stable.   Delay start of Pharmacological VTE agent (>24hrs) due to surgical blood loss or risk of bleeding: yes

## 2013-08-10 NOTE — Anesthesia Postprocedure Evaluation (Signed)
  Anesthesia Post-op Note  Patient: Dennis Spencer  Procedure(s) Performed: Procedure(s) (LRB): NEPHROLITHOTOMY PERCUTANEOUS SECOND LOOK/LEFT DIGITAL URETEROSCOPY/BASKETING OF STONE/EXCHANGE OF LEFT URETERAL STENT (Left) 2ND STAGE CYSTOSCOPY WITH RETROGRADE PYELOGRAM, URETEROSCOPY AND STENT EXCHANGE (Right) HOLMIUM LASER APPLICATION (Left)  Patient Location: PACU  Anesthesia Type: General  Level of Consciousness: awake and alert   Airway and Oxygen Therapy: Patient Spontanous Breathing  Post-op Pain: mild  Post-op Assessment: Post-op Vital signs reviewed, Patient's Cardiovascular Status Stable, Respiratory Function Stable, Patent Airway and No signs of Nausea or vomiting  Last Vitals:  Filed Vitals:   08/10/13 1815  BP: 153/66  Pulse: 77  Temp: 36.2 C  Resp: 16    Post-op Vital Signs: stable   Complications: No apparent anesthesia complications

## 2013-08-10 NOTE — Progress Notes (Signed)
Trach CK not done. Pt not in room. Per NT pt in surgery.

## 2013-08-11 ENCOUNTER — Encounter (HOSPITAL_COMMUNITY): Payer: Self-pay | Admitting: Urology

## 2013-08-11 LAB — BASIC METABOLIC PANEL
BUN: 12 mg/dL (ref 6–23)
CHLORIDE: 104 meq/L (ref 96–112)
CO2: 25 meq/L (ref 19–32)
CREATININE: 1.89 mg/dL — AB (ref 0.50–1.35)
Calcium: 9.2 mg/dL (ref 8.4–10.5)
GFR calc non Af Amer: 35 mL/min — ABNORMAL LOW (ref >90)
GFR, EST AFRICAN AMERICAN: 41 mL/min — AB (ref >90)
Glucose, Bld: 141 mg/dL — ABNORMAL HIGH (ref 70–99)
Potassium: 4.8 mEq/L (ref 3.7–5.3)
SODIUM: 137 meq/L (ref 137–147)

## 2013-08-11 LAB — HEMOGLOBIN AND HEMATOCRIT, BLOOD
HEMATOCRIT: 33.5 % — AB (ref 39.0–52.0)
Hemoglobin: 11.3 g/dL — ABNORMAL LOW (ref 13.0–17.0)

## 2013-08-11 MED ORDER — OXYCODONE-ACETAMINOPHEN 5-325 MG PO TABS: 1.0000 | ORAL_TABLET | ORAL | Status: DC | PRN

## 2013-08-11 MED ORDER — CEPHALEXIN 500 MG PO CAPS: 500.0000 mg | ORAL_CAPSULE | Freq: Two times a day (BID) | ORAL | Status: DC

## 2013-08-11 MED ORDER — SENNOSIDES-DOCUSATE SODIUM 8.6-50 MG PO TABS: 1.0000 | ORAL_TABLET | Freq: Two times a day (BID) | ORAL | Status: DC

## 2013-08-11 NOTE — Op Note (Signed)
Dennis Spencer, MASTRANGELO NO.:  [id number]  MEDICAL RECORD NO.:  [id number]  LOCATION:                                 FACILITY:  PHYSICIAN:  Alexis Frock, MD     DATE OF BIRTH:  [birth year]  DATE OF PROCEDURE:  08/10/2013 DATE OF DISCHARGE:                              OPERATIVE REPORT   DIAGNOSIS:  Large volume residual right ureteral and left renal stones and left ureteral stone.  PROCEDURE: 1. Second stage right ureteroscopy with laser lithotripsy. 2. Exchange of right ureteral stent. 3. Right retrograde pyelogram interpretation. 4. Left second-stage percutaneous nephrostolithotomy stone greater     than 2 cm. 5. Left ureteroscopy with basketing of stones. 6. Exchange of left ureteral stent. 7. Left antegrade nephrostogram with interpretation.  ESTIMATED BLOOD LOSS:  Nil.  COMPLICATIONS:  None.  SPECIMENS:  Right ureteral, left ureteral, and left renal stones; all given to the patient.  FINDINGS: 1. Residual large volume right mid and proximal ureteral stone,     approximately 1.5 cm. 2. Large hydronephrosis without ureteral nephrosis consistent with     chronic changes on the right side, right side stone greater than     1/3 mm removed. 3. Residual large volume left upper pole renal stone, total volume     estimated 3 cm pre-fragementation. 4. Small volume left ureteral stone.  This was amenable to simple     basketing. 5. Unremarkable left antegrade nephrostogram except for filling     defects in the upper pole consistent with known stone before     manipulation.  INDICATION:  Mr. 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  Following this, he underwent first-stage left percutaneous nephrostolithotomy and right ureteroscopic stone manipulation on August 08, 2013.  He now presents for second-stage procedure bilaterally once again.  Informed  consent was obtained and placed in the medical record.  PROCEDURE IN DETAIL:  The patient being Clanton Emanuelson was verified. Procedure being right second stage ureteroscopic stimulation and left second stage percutaneous nephrostolithotomy was confirmed.  Procedure was carried out.  Time-out was performed.  Intravenous antibiotics were administered.  General endotracheal anesthesia was introduced via the patient's tracheostomy.  This was fashioned into place suitably.  He was placed into a low lithotomy position.  Sterile field was created by prepping and draping the patient's penis, perineum, and proximal thighs using iodine x3.  Next, cystourethroscopy was performed using 22-French rigid cystoscope with 12-degree offset lens.  Inspection of the urinary bladder revealed several residual stone fragments likely from migrated ureteral stone.  These were irrigated and set aside for the specimen today.  Distal end of right ureteral stone was grasped and it was brought out in its entirety.  The right ureteral orifice was cannulated with a 6-French end-hole catheter and right retrograde pyelogram was obtained.  Right retrograde pyelogram demonstrated single right ureter and single system right kidney.  There was large hydronephrosis. There were still several field defects in the proximal and mid ureter on the right consistent with residual stone.  A 0.038 Glidewire was advanced to the level of the  upper pole and set aside as a safety wire.  Next, semi- rigid ureteroscopy was performed of the right ureter alongside a separate Sensor working wire and multifocal fragments that appeared to be amenable to simple basketing were seen. These were grasped with basket and brought out and set in the urinary bladder.  Proximal to these was what was estimated to be about 4 cm distal to the right UVJ, another very large stone was seen. This was greater than 1 cm.  This appeared to be much too large for  simple basketing.  As such, holmium laser energy was applied to the stone using settings of 0.5 joules at 20 Hz dusting approximately 50% of the stone and fragmenting the remaining into fragments 4 mm or less.  These were then each grasped with an escape basket brought out to the level of urinary bladder.  Repeat semi- rigid ureteroscopy of entire length of ureter revealed no residual stone fragments larger than 1/3 mm and no evidence of mucosal injury.  The Sensor working wire was once again introduced and the semi-rigid ureteroscope was exchanged for a 38 cm 12/14 ureteral access sheath, which was placed under fluoroscopic guidance to the level of proximal ureter.  Next, flexible digital ureteroscopy was performed of the right kidney and proximal ureter using 8-French digital ureteroscope.  No mucosal abnormalities or calcifications large than 1/3 mm were seen. This  corroborated complete resolution of stone on the right side. Access sheath was removed and a new 6 x 26 double-J stent was placed on the right side using cystoscopic and fluoroscopic guidance.  Good proximal and distal curl were noted.  Notably, during all ureteroscopic portions, an 8-French feeding tube was in the urinary bladder for pressure release.  An 18-French Coude catheter was placed per urethra to straight drain 2 mL of sterile water in the balloon.  The patient was then very carefully repositioned into a separate operating table in prone position employing prone view, chest rolls, axillary rolls, padding of his knees and ankles, sequential compression devices were verified once again, he was found to be suitably positioned and a new sterile field was created by prepping and draping the patient's prior right nephroureteral stent and right nephrostomy tube into a left flank operative field using chlorhexidine gluconate.  Left antegrade nephrostogram was then obtained.  Left antegrade nephrostogram revealed no  extravasation of the left kidney.  There were multiple filling defects in the upper pole consistent with likely known large volume stone in this location.  The nephrostomy tube was then removed.  The nephroureteral stent was left in place acting as a safety apparatus.  Next, flexible nephroscopy was performed using a 16-French flexible cystoscope and panendoscopy was performed of the left kidney.  There were several foci of small stones in the lower pole, each of these were approximately 5 mm.  These were grasped and brought out in their entirety.  As expected using the flexible technique and without the ureteral access sheath, the upper pole calyx could be access, although this was somewhat difficult being it acutely angled, however, this did provide suitable access to remove all stones smaller than 8 mm using basketing alone and they were brought out in their entirety and set aside.  There were 2 large upper pole calcifications that were grasped with a ZeroTip basket and repositioned into the renal pelvis.  These were then treated using holmium laser energy with settings of 0.5 joules and 20 Hz, ablating these stones into fragments that were  8 mm or less in diameter.  These were then grasped with ZeroTip basket and brought out in their entirety.  Repeat panendoscopy of the left kidney revealed complete resolution of all stone fragments larger than 1/3 mm.  No evidence of perforation.  The proximal ureter was inspected and no mucosal abnormalities or calcifications were seen.  To verify complete ureteral clearance, a separate Sensor working wire was advanced down the level of the left ureter over which the 8-French digital ureteroscope was advanced in the level of the distal third of the ureter, multifocal ureteral stones were found and these were somewhat impacting as well as these clearly warranted removal.  As such, the Sensor working wire was once again introduced and the 38 cm 12/14  ureteral access sheath was placed in antegrade fashion at a position approximately 5 cm above the level of distal stone.  Flexible digital ureteroscope was then used in antegrade fashion and using a basketing technique, these stones were then completely removed, which then allowed inspection of the left ureter all the way to the urinary bladder.  Panendoscopy of the left ureter revealed complete resolution of all stone fragments larger than 1/3 mm following this additional ureteroscopy on the left side.  Attention was then directed at left ureteral stenting. The nephroureteral stent was exchanged for a zip wire acting as a safety wire.  The Sensor working wire was once again introduced in antegrade fashion over which a new 6 x 26 double-J stent was placed using nephroscopic and fluoroscopic guidance.  Good proximal and distal deployment were noted.  The remaining safety wire was removed.  Hemostasis appeared excellent.  The tract was sealed with 10 mL of Surgiflo and closed at the level of skin using interrupted Vicryl followed by dry dressing.  Procedure was then terminated.  The patient tolerated the procedure well.  There were no immediate periprocedural complications.  The patient was taken to the postanesthesia care unit in stable condition.          ______________________________ Alexis Frock, MD     TM/MEDQ  D:  08/10/2013  T:  08/11/2013  Job:  [id number]

## 2013-08-11 NOTE — Discharge Summary (Signed)
Physician Discharge Summary  Patient ID: Dennis Spencer MRN: [id number] DOB/AGE: [birth year] 67 y.o.  Admit date: 08/08/2013 Discharge date: 08/11/2013  Admission Diagnoses: Very Large Volume Bilateral Nephrolithiasis  Discharge Diagnoses: Very Large Volume Bilateral Nephrolithiasis   Discharged Condition: good  Hospital Course:   1 - Recurrent Surgical Nephrolithiasis - s/p right 1st stage ureteroscopic stone manipulation and left 1st stage percutaneous nephrostolithotomy 08/08/2013, second stage right ureteroscopic stone manipulation and left second stage percutaneous nephrostolithotomy 08/10/2013. He was kept in house between procedures with interval nephrostomy. By 4/2, the day of discharge, pt ambulatory, pain controlled on PO meds, voiding well with foley and nephrostomy removed, Hgb and Cr stable, and felt to be adequate for discharge.   2 - Renal Insuficiency - Baseline approx 1.0. Had acute rise to 6 during episode 06/2013 with bilateral obstructing stones. K acceptable during episode. Most recent Cr  1.7 07/2013 on admission and 1.8 (approx stable) at discharge without volume problems or hyperkalemia.    Consults: None  Significant Diagnostic Studies: labs: Hgb 11.3, Cr 1.89 at discharge.   Treatments: surgery:  1 -  right 1st stage ureteroscopic stone manipulation and left 1st stage percutaneous nephrostolithotomy 08/08/2013 2 -  second stage right ureteroscopic stone manipulation and left second stage percutaneous nephrostolithotomy 08/10/2013  Discharge Exam: Blood pressure 129/70, pulse 72, temperature 98.7 F (37.1 C), temperature source Oral, resp. rate 18, height 5\' 10"  (1.778 m), weight 133.7 kg (294 lb 12.1 oz), SpO2 96.00%. General appearance: alert, cooperative and appears stated age Head: Normocephalic, without obvious abnormality, atraumatic Neck: supple, symmetrical, trachea midline and trach in place on room air with PMV in place Back: symmetric, no curvature. ROM  normal. No CVA tenderness. Resp: no labored breathing or tachypnea Cardio: Nl rate GI: soft, non-tender; bowel sounds normal; no masses,  no organomegaly Male genitalia: normal Extremities: extremities normal, atraumatic, no cyanosis or edema Pulses: 2+ and symmetric Skin: Skin color, texture, turgor normal. No rashes or lesions Neurologic: Grossly normal Incision/Wound: Recent left nephrostomy site c/d/i with dry dressing.  Disposition: 01-Home or Self Care     Medication List         amLODipine 5 MG tablet  Commonly known as:  NORVASC  Take 5 mg by mouth every morning.     aspirin EC 81 MG tablet  Take 81 mg by mouth daily.     atorvastatin 40 MG tablet  Commonly known as:  LIPITOR  Take 40 mg by mouth every evening.     calcium citrate-vitamin D 315-200 MG-UNIT per tablet  Commonly known as:  CITRACAL+D  Take 1 tablet by mouth 2 (two) times daily.     cephALEXin 500 MG capsule  Commonly known as:  KEFLEX  Take 1 capsule (500 mg total) by mouth 2 (two) times daily. X 3 days. Begin day prior to next Urology appointment.     gabapentin 600 MG tablet  Commonly known as:  NEURONTIN  Take 300 mg by mouth at bedtime. Takes 1/2 tablet     multivitamin with minerals Tabs tablet  Take 1 tablet by mouth daily.     omeprazole 20 MG tablet  Commonly known as:  PRILOSEC OTC  Take 20 mg by mouth daily.     oxyCODONE-acetaminophen 5-325 MG per tablet  Commonly known as:  ROXICET  Take 1-2 tablets by mouth every 4 (four) hours as needed for moderate pain or severe pain. Post-operatively     senna-docusate 8.6-50 MG per tablet  Commonly known as:  Senokot-S  Take 1 tablet by mouth 2 (two) times daily. While taking pain meds to prevent constipation     vitamin B-12 1000 MCG tablet  Commonly known as:  CYANOCOBALAMIN  Take 1,000 mcg by mouth daily.     vitamin C 500 MG tablet  Commonly known as:  ASCORBIC ACID  Take 500 mg by mouth 2 (two) times daily.     Vitamin D3  2000 UNITS capsule  Take 4,000 Units by mouth daily.           Follow-up Information   Follow up with Alexis Frock, MD On 08/29/2013. (at 1 pm for MD visit and ureteral stent removal in office)    Specialty:  Urology   Contact information:   [facility]  [place] [address] [phone]       Signed: Alexis Frock 08/11/2013, 12:24 PM

## 2013-08-11 NOTE — Discharge Instructions (Signed)
1 - You may have urinary urgency (bladder spasms) and bloody urine on / off with stent in place. This is normal. ° °2 - Call MD or go to ER for fever >102, severe pain / nausea / vomiting not relieved by medications, or acute change in medical status ° °

## 2013-09-02 ENCOUNTER — Other Ambulatory Visit: Payer: Self-pay | Admitting: *Deleted

## 2013-09-02 MED ORDER — ATORVASTATIN CALCIUM 40 MG PO TABS: 40.0000 mg | ORAL_TABLET | Freq: Every evening | ORAL | Status: DC

## 2013-09-12 ENCOUNTER — Encounter: Payer: Self-pay | Admitting: Family Medicine

## 2013-09-12 ENCOUNTER — Ambulatory Visit (INDEPENDENT_AMBULATORY_CARE_PROVIDER_SITE_OTHER): Payer: Commercial Managed Care - HMO | Admitting: Family Medicine

## 2013-09-12 ENCOUNTER — Telehealth: Payer: Self-pay | Admitting: Family Medicine

## 2013-09-12 VITALS — BP 147/78 | HR 69 | Temp 98.2°F | Wt 291.0 lb

## 2013-09-12 DIAGNOSIS — I10 Essential (primary) hypertension: Secondary | ICD-10-CM

## 2013-09-12 DIAGNOSIS — E785 Hyperlipidemia, unspecified: Secondary | ICD-10-CM

## 2013-09-12 DIAGNOSIS — N179 Acute kidney failure, unspecified: Secondary | ICD-10-CM

## 2013-09-12 LAB — POCT GLYCOSYLATED HEMOGLOBIN (HGB A1C): Hemoglobin A1C: 5

## 2013-09-12 LAB — POCT CBC
Granulocyte percent: 57.2 %G (ref 37–80)
HCT, POC: 40.3 % — AB (ref 43.5–53.7)
Hemoglobin: 12.8 g/dL — AB (ref 14.1–18.1)
LYMPH, POC: 1.6 (ref 0.6–3.4)
MCH: 30.2 pg (ref 27–31.2)
MCHC: 31.7 g/dL — AB (ref 31.8–35.4)
MCV: 95.2 fL (ref 80–97)
MPV: 8.2 fL (ref 0–99.8)
PLATELET COUNT, POC: 204 10*3/uL (ref 142–424)
POC Granulocyte: 2.6 (ref 2–6.9)
POC LYMPH %: 34.7 % (ref 10–50)
RBC: 4.2 M/uL — AB (ref 4.69–6.13)
RDW, POC: 12.8 %
WBC: 4.5 10*3/uL — AB (ref 4.6–10.2)

## 2013-09-12 MED ORDER — GABAPENTIN 600 MG PO TABS: 300.0000 mg | ORAL_TABLET | Freq: Every day | ORAL | Status: DC

## 2013-09-12 MED ORDER — ATORVASTATIN CALCIUM 40 MG PO TABS: 40.0000 mg | ORAL_TABLET | Freq: Every evening | ORAL | Status: DC

## 2013-09-12 MED ORDER — AMLODIPINE BESYLATE 5 MG PO TABS: 5.0000 mg | ORAL_TABLET | Freq: Every morning | ORAL | Status: DC

## 2013-09-12 NOTE — Progress Notes (Signed)
   Subjective:    Patient ID: Dennis Spencer, male    DOB: 1945-12-14, 68 y.o.   MRN: [id number]  HPI Pt presents today for general follow up visit.  No acute concerns.  Was noted to have been recently admitted for AKI 2/2 recurrent nephrolithiasis.  Was admitted 3/30-4/2. S/p ureteral stents and L nephrolithostomy. Please see full d/c summary.  Had ureteral stents removed 3 weeks ago.  Was also admitted with Cr 6. Was around 1.5 on discharge.  Has follow up with renal in the next 1-2 months per pt. No on ACE or diuretic. No NSAID use.  No acute issues currently.  Has trach in place for sleep apnea that was placed in 80s. Well maintained. Has been effective for CPAP.  Asking refills of neurontin, norvasc, atorvastatin.     Review of Systems  All other systems reviewed and are negative.      Objective:   Physical Exam  Constitutional:  Morbidly obese    HENT:  Head: Normocephalic and atraumatic.  Eyes: Conjunctivae are normal. Pupils are equal, round, and reactive to light.  Neck: Normal range of motion. Neck supple.  tracheostomy CDI    Cardiovascular: Normal rate and regular rhythm.   Pulmonary/Chest: Effort normal.  Abdominal: Soft. Bowel sounds are normal.  Neurological: He is alert.  Skin: Skin is warm.   Filed Vitals:   09/12/13 0915  BP: 147/78  Pulse: 69  Temp: 98.2 F (36.8 C)           Assessment & Plan:  HLD (hyperlipidemia) - Plan: Comprehensive metabolic panel, POCT CBC, POCT glycosylated hemoglobin (Hb A1C), NMR, lipoprofile  WIl check risk stratification labs.  Recheck renal function. Has renal follow up pending.  Also has urology follow up pending.  Discussed general and GU red flags. Follow up as needed.

## 2013-09-12 NOTE — Telephone Encounter (Signed)
Patient aware.

## 2013-09-12 NOTE — Telephone Encounter (Signed)
Message copied by Waverly Ferrari on Mon Sep 12, 2013 10:49 AM ------      Message from: Deneise Lever      Created: Mon Sep 12, 2013 10:14 AM       CBC and a1C look good/stable.       Please inform pt.       Thank you. ------

## 2013-09-13 LAB — NMR, LIPOPROFILE
CHOLESTEROL: 156 mg/dL (ref <200)
HDL Cholesterol by NMR: 43 mg/dL (ref >=40)
HDL PARTICLE NUMBER: 30.3 umol/L — AB (ref >=30.5)
LDL Particle Number: 1139 nmol/L — ABNORMAL HIGH (ref <1000)
LDL SIZE: 20.3 nm (ref >20.5)
LDLC SERPL CALC-MCNC: 92 mg/dL (ref <100)
LP-IR Score: 60 — ABNORMAL HIGH (ref <=45)
Small LDL Particle Number: 597 nmol/L — ABNORMAL HIGH (ref <=527)
Triglycerides by NMR: 107 mg/dL (ref <150)

## 2013-09-13 LAB — COMPREHENSIVE METABOLIC PANEL
ALT: 11 IU/L (ref 0–44)
AST: 20 IU/L (ref 0–40)
Albumin/Globulin Ratio: 1.9 (ref 1.1–2.5)
Albumin: 4.1 g/dL (ref 3.6–4.8)
Alkaline Phosphatase: 71 IU/L (ref 39–117)
BUN/Creatinine Ratio: 13 (ref 10–22)
BUN: 19 mg/dL (ref 8–27)
CALCIUM: 9.4 mg/dL (ref 8.6–10.2)
CO2: 26 mmol/L (ref 18–29)
CREATININE: 1.43 mg/dL — AB (ref 0.76–1.27)
Chloride: 102 mmol/L (ref 97–108)
GFR calc Af Amer: 59 mL/min/{1.73_m2} — ABNORMAL LOW (ref >59)
GFR calc non Af Amer: 51 mL/min/{1.73_m2} — ABNORMAL LOW (ref >59)
GLOBULIN, TOTAL: 2.2 g/dL (ref 1.5–4.5)
Glucose: 94 mg/dL (ref 65–99)
Potassium: 4.3 mmol/L (ref 3.5–5.2)
Sodium: 139 mmol/L (ref 134–144)
Total Bilirubin: 0.6 mg/dL (ref 0.0–1.2)
Total Protein: 6.3 g/dL (ref 6.0–8.5)

## 2013-09-28 ENCOUNTER — Other Ambulatory Visit: Payer: Self-pay | Admitting: Family Medicine

## 2013-09-28 MED ORDER — ATORVASTATIN CALCIUM 20 MG PO TABS: 60.0000 mg | ORAL_TABLET | Freq: Every evening | ORAL | Status: DC

## 2013-09-29 ENCOUNTER — Telehealth: Payer: Self-pay | Admitting: Family Medicine

## 2013-09-29 NOTE — Telephone Encounter (Signed)
Message copied by Cline Crock on Thu Sep 29, 2013 12:43 PM ------      Message from: Deneise Lever      Created: Wed Sep 28, 2013  3:43 PM       Slight improvement in renal function.       Still needs renal follow up       Mildly elevated LDL. Would like to be in low range.       Increase lipitor to 60mg  daily. I will order this.       Please inform pt.                     ------

## 2013-11-10 ENCOUNTER — Inpatient Hospital Stay (HOSPITAL_COMMUNITY)
Admission: EM | Admit: 2013-11-10 | Discharge: 2013-11-12 | DRG: 313 | Disposition: A | Discharge status: Home / Self Care | Payer: Medicare HMO | Attending: Internal Medicine | Admitting: Internal Medicine

## 2013-11-10 ENCOUNTER — Emergency Department (HOSPITAL_COMMUNITY): Payer: Medicare HMO

## 2013-11-10 ENCOUNTER — Encounter (HOSPITAL_COMMUNITY): Payer: Self-pay | Admitting: Emergency Medicine

## 2013-11-10 DIAGNOSIS — N183 Chronic kidney disease, stage 3 unspecified: Secondary | ICD-10-CM

## 2013-11-10 DIAGNOSIS — Z87442 Personal history of urinary calculi: Secondary | ICD-10-CM

## 2013-11-10 DIAGNOSIS — I498 Other specified cardiac arrhythmias: Secondary | ICD-10-CM

## 2013-11-10 DIAGNOSIS — R7309 Other abnormal glucose: Secondary | ICD-10-CM

## 2013-11-10 DIAGNOSIS — Z79899 Other long term (current) drug therapy: Secondary | ICD-10-CM

## 2013-11-10 DIAGNOSIS — R079 Chest pain, unspecified: Secondary | ICD-10-CM

## 2013-11-10 DIAGNOSIS — E7800 Pure hypercholesterolemia, unspecified: Secondary | ICD-10-CM

## 2013-11-10 DIAGNOSIS — Z87891 Personal history of nicotine dependence: Secondary | ICD-10-CM

## 2013-11-10 DIAGNOSIS — I10 Essential (primary) hypertension: Secondary | ICD-10-CM

## 2013-11-10 DIAGNOSIS — E785 Hyperlipidemia, unspecified: Secondary | ICD-10-CM | POA: Diagnosis present

## 2013-11-10 DIAGNOSIS — K2100 Gastro-esophageal reflux disease with esophagitis, without bleeding: Secondary | ICD-10-CM

## 2013-11-10 DIAGNOSIS — Z8249 Family history of ischemic heart disease and other diseases of the circulatory system: Secondary | ICD-10-CM

## 2013-11-10 DIAGNOSIS — R9439 Abnormal result of other cardiovascular function study: Secondary | ICD-10-CM | POA: Diagnosis present

## 2013-11-10 DIAGNOSIS — R9431 Abnormal electrocardiogram [ECG] [EKG]: Secondary | ICD-10-CM

## 2013-11-10 DIAGNOSIS — R739 Hyperglycemia, unspecified: Secondary | ICD-10-CM | POA: Diagnosis present

## 2013-11-10 DIAGNOSIS — R001 Bradycardia, unspecified: Secondary | ICD-10-CM

## 2013-11-10 DIAGNOSIS — K2970 Gastritis, unspecified, without bleeding: Secondary | ICD-10-CM

## 2013-11-10 DIAGNOSIS — Z9884 Bariatric surgery status: Secondary | ICD-10-CM

## 2013-11-10 DIAGNOSIS — R0789 Other chest pain: Principal | ICD-10-CM

## 2013-11-10 DIAGNOSIS — I129 Hypertensive chronic kidney disease with stage 1 through stage 4 chronic kidney disease, or unspecified chronic kidney disease: Secondary | ICD-10-CM | POA: Diagnosis present

## 2013-11-10 DIAGNOSIS — K219 Gastro-esophageal reflux disease without esophagitis: Secondary | ICD-10-CM

## 2013-11-10 DIAGNOSIS — Z930 Tracheostomy status: Secondary | ICD-10-CM

## 2013-11-10 DIAGNOSIS — Z833 Family history of diabetes mellitus: Secondary | ICD-10-CM

## 2013-11-10 DIAGNOSIS — Z7982 Long term (current) use of aspirin: Secondary | ICD-10-CM

## 2013-11-10 DIAGNOSIS — Z6841 Body Mass Index (BMI) 40.0 and over, adult: Secondary | ICD-10-CM

## 2013-11-10 DIAGNOSIS — G4733 Obstructive sleep apnea (adult) (pediatric): Secondary | ICD-10-CM

## 2013-11-10 HISTORY — DX: Chronic kidney disease, stage 3 (moderate): N18.3

## 2013-11-10 HISTORY — DX: Morbid (severe) obesity due to excess calories: E66.01

## 2013-11-10 HISTORY — DX: Chronic kidney disease, stage 3 unspecified: N18.30

## 2013-11-10 LAB — MRSA PCR SCREENING: MRSA BY PCR: NEGATIVE

## 2013-11-10 LAB — COMPREHENSIVE METABOLIC PANEL
ALT: 19 U/L (ref 0–53)
AST: 25 U/L (ref 0–37)
Albumin: 4.5 g/dL (ref 3.5–5.2)
Alkaline Phosphatase: 85 U/L (ref 39–117)
Anion gap: 14 (ref 5–15)
BUN: 18 mg/dL (ref 6–23)
CALCIUM: 10.4 mg/dL (ref 8.4–10.5)
CHLORIDE: 94 meq/L — AB (ref 96–112)
CO2: 27 mEq/L (ref 19–32)
Creatinine, Ser: 1.38 mg/dL — ABNORMAL HIGH (ref 0.50–1.35)
GFR calc Af Amer: 60 mL/min — ABNORMAL LOW (ref >90)
GFR calc non Af Amer: 52 mL/min — ABNORMAL LOW (ref >90)
Glucose, Bld: 145 mg/dL — ABNORMAL HIGH (ref 70–99)
Potassium: 4.4 mEq/L (ref 3.7–5.3)
SODIUM: 135 meq/L — AB (ref 137–147)
Total Bilirubin: 0.8 mg/dL (ref 0.3–1.2)
Total Protein: 8.2 g/dL (ref 6.0–8.3)

## 2013-11-10 LAB — I-STAT TROPONIN, ED
TROPONIN I, POC: 0 ng/mL (ref 0.00–0.08)
Troponin i, poc: 0 ng/mL (ref 0.00–0.08)

## 2013-11-10 LAB — TSH: TSH: 2.41 u[IU]/mL (ref 0.350–4.500)

## 2013-11-10 LAB — CBC WITH DIFFERENTIAL/PLATELET
BASOS ABS: 0 10*3/uL (ref 0.0–0.1)
Basophils Relative: 0 % (ref 0–1)
Eosinophils Absolute: 0 10*3/uL (ref 0.0–0.7)
Eosinophils Relative: 0 % (ref 0–5)
HEMATOCRIT: 45.6 % (ref 39.0–52.0)
Hemoglobin: 16.3 g/dL (ref 13.0–17.0)
LYMPHS PCT: 14 % (ref 12–46)
Lymphs Abs: 1.4 10*3/uL (ref 0.7–4.0)
MCH: 32.3 pg (ref 26.0–34.0)
MCHC: 35.7 g/dL (ref 30.0–36.0)
MCV: 90.3 fL (ref 78.0–100.0)
Monocytes Absolute: 0.7 10*3/uL (ref 0.1–1.0)
Monocytes Relative: 6 % (ref 3–12)
NEUTROS ABS: 8.2 10*3/uL — AB (ref 1.7–7.7)
Neutrophils Relative %: 80 % — ABNORMAL HIGH (ref 43–77)
PLATELETS: 233 10*3/uL (ref 150–400)
RBC: 5.05 MIL/uL (ref 4.22–5.81)
RDW: 12.1 % (ref 11.5–15.5)
WBC: 10.3 10*3/uL (ref 4.0–10.5)

## 2013-11-10 LAB — HEMOGLOBIN A1C
Hgb A1c MFr Bld: 5.7 % — ABNORMAL HIGH (ref <5.7)
Mean Plasma Glucose: 117 mg/dL — ABNORMAL HIGH (ref <117)

## 2013-11-10 LAB — LIPASE, BLOOD: Lipase: 14 U/L (ref 11–59)

## 2013-11-10 LAB — TROPONIN I: Troponin I: <0.3 ng/mL (ref <0.30)

## 2013-11-10 MED ORDER — ADULT MULTIVITAMIN W/MINERALS CH
1.0000 | ORAL_TABLET | Freq: Every day | ORAL | Status: DC
  Administered 2013-11-10 – 2013-11-12 (×3): 1 via ORAL
  Filled 2013-11-10 (×3): qty 1

## 2013-11-10 MED ORDER — ASPIRIN EC 81 MG PO TBEC
81.0000 mg | DELAYED_RELEASE_TABLET | Freq: Every day | ORAL | Status: DC
  Administered 2013-11-10 – 2013-11-12 (×3): 81 mg via ORAL
  Filled 2013-11-10 (×3): qty 1

## 2013-11-10 MED ORDER — GABAPENTIN 300 MG PO CAPS
300.0000 mg | ORAL_CAPSULE | Freq: Every day | ORAL | Status: DC
  Administered 2013-11-10 – 2013-11-11 (×2): 300 mg via ORAL
  Filled 2013-11-10 (×3): qty 1

## 2013-11-10 MED ORDER — VITAMIN C 500 MG PO TABS
500.0000 mg | ORAL_TABLET | Freq: Two times a day (BID) | ORAL | Status: DC
  Administered 2013-11-10 – 2013-11-12 (×5): 500 mg via ORAL
  Filled 2013-11-10 (×8): qty 1

## 2013-11-10 MED ORDER — GI COCKTAIL ~~LOC~~
30.0000 mL | Freq: Three times a day (TID) | ORAL | Status: DC | PRN
  Filled 2013-11-10: qty 30

## 2013-11-10 MED ORDER — PANTOPRAZOLE SODIUM 40 MG IV SOLR
40.0000 mg | Freq: Once | INTRAVENOUS | Status: AC
  Administered 2013-11-10: 40 mg via INTRAVENOUS
  Filled 2013-11-10: qty 40

## 2013-11-10 MED ORDER — CALCIUM CARBONATE-VITAMIN D 500-200 MG-UNIT PO TABS
1.0000 | ORAL_TABLET | Freq: Three times a day (TID) | ORAL | Status: DC
  Administered 2013-11-10 – 2013-11-12 (×6): 1 via ORAL
  Filled 2013-11-10 (×9): qty 1

## 2013-11-10 MED ORDER — ONDANSETRON HCL 4 MG PO TABS: 4.0000 mg | ORAL_TABLET | Freq: Four times a day (QID) | ORAL | Status: DC | PRN

## 2013-11-10 MED ORDER — ASPIRIN 81 MG PO CHEW
CHEWABLE_TABLET | ORAL | Status: AC
  Administered 2013-11-10: 162 mg via ORAL
  Filled 2013-11-10: qty 2

## 2013-11-10 MED ORDER — SODIUM CHLORIDE 0.9 % IJ SOLN: 3.0000 mL | INTRAMUSCULAR | Status: DC | PRN

## 2013-11-10 MED ORDER — ONDANSETRON HCL 4 MG/2ML IJ SOLN
4.0000 mg | Freq: Once | INTRAMUSCULAR | Status: AC
  Administered 2013-11-10: 4 mg via INTRAVENOUS
  Filled 2013-11-10: qty 2

## 2013-11-10 MED ORDER — VITAMIN B-12 1000 MCG PO TABS
1000.0000 ug | ORAL_TABLET | Freq: Every day | ORAL | Status: DC
  Administered 2013-11-10 – 2013-11-12 (×3): 1000 ug via ORAL
  Filled 2013-11-10 (×3): qty 1

## 2013-11-10 MED ORDER — OXYCODONE HCL 5 MG PO TABS: 5.0000 mg | ORAL_TABLET | ORAL | Status: DC | PRN

## 2013-11-10 MED ORDER — ACETAMINOPHEN 650 MG RE SUPP: 650.0000 mg | Freq: Four times a day (QID) | RECTAL | Status: DC | PRN

## 2013-11-10 MED ORDER — ASPIRIN 81 MG PO CHEW
162.0000 mg | CHEWABLE_TABLET | Freq: Once | ORAL | Status: AC
  Administered 2013-11-10: 162 mg via ORAL

## 2013-11-10 MED ORDER — VITAMIN D 1000 UNITS PO TABS
4000.0000 [IU] | ORAL_TABLET | Freq: Every day | ORAL | Status: DC
  Administered 2013-11-10 – 2013-11-11 (×2): 4000 [IU] via ORAL
  Administered 2013-11-12: 2000 [IU] via ORAL
  Filled 2013-11-10 (×3): qty 4

## 2013-11-10 MED ORDER — MORPHINE SULFATE 4 MG/ML IJ SOLN
4.0000 mg | Freq: Once | INTRAMUSCULAR | Status: AC
  Administered 2013-11-10: 4 mg via INTRAVENOUS
  Filled 2013-11-10: qty 1

## 2013-11-10 MED ORDER — ALUM & MAG HYDROXIDE-SIMETH 200-200-20 MG/5ML PO SUSP: 30.0000 mL | Freq: Four times a day (QID) | ORAL | Status: DC | PRN

## 2013-11-10 MED ORDER — GI COCKTAIL ~~LOC~~
30.0000 mL | Freq: Once | ORAL | Status: AC
  Administered 2013-11-10: 30 mL via ORAL
  Filled 2013-11-10: qty 30

## 2013-11-10 MED ORDER — CALCIUM CITRATE-VITAMIN D 315-200 MG-UNIT PO TABS: 2.0000 | ORAL_TABLET | Freq: Three times a day (TID) | ORAL | Status: DC

## 2013-11-10 MED ORDER — NITROGLYCERIN 0.4 MG SL SUBL
0.4000 mg | SUBLINGUAL_TABLET | SUBLINGUAL | Status: AC | PRN
Start: 2013-11-10 — End: 2013-11-10
  Administered 2013-11-10 (×3): 0.4 mg via SUBLINGUAL
  Filled 2013-11-10: qty 1

## 2013-11-10 MED ORDER — AMLODIPINE BESYLATE 5 MG PO TABS
5.0000 mg | ORAL_TABLET | Freq: Every morning | ORAL | Status: DC
  Administered 2013-11-10 – 2013-11-12 (×3): 5 mg via ORAL
  Filled 2013-11-10 (×3): qty 1

## 2013-11-10 MED ORDER — ENOXAPARIN SODIUM 40 MG/0.4ML ~~LOC~~ SOLN
40.0000 mg | SUBCUTANEOUS | Status: DC
  Administered 2013-11-10: 40 mg via SUBCUTANEOUS
  Filled 2013-11-10: qty 0.4

## 2013-11-10 MED ORDER — SODIUM CHLORIDE 0.9 % IJ SOLN
3.0000 mL | Freq: Two times a day (BID) | INTRAMUSCULAR | Status: DC
  Administered 2013-11-11: 3 mL via INTRAVENOUS

## 2013-11-10 MED ORDER — ACETAMINOPHEN 325 MG PO TABS: 650.0000 mg | ORAL_TABLET | Freq: Four times a day (QID) | ORAL | Status: DC | PRN

## 2013-11-10 MED ORDER — SODIUM CHLORIDE 0.9 % IV SOLN
250.0000 mL | INTRAVENOUS | Status: DC | PRN
Start: 2013-11-10 — End: 2013-11-12

## 2013-11-10 MED ORDER — ATORVASTATIN CALCIUM 40 MG PO TABS
60.0000 mg | ORAL_TABLET | Freq: Every evening | ORAL | Status: DC
  Administered 2013-11-10 – 2013-11-11 (×2): 60 mg via ORAL
  Filled 2013-11-10: qty 1
  Filled 2013-11-10: qty 2
  Filled 2013-11-10 (×3): qty 1

## 2013-11-10 MED ORDER — ONDANSETRON HCL 4 MG/2ML IJ SOLN: 4.0000 mg | Freq: Four times a day (QID) | INTRAMUSCULAR | Status: DC | PRN

## 2013-11-10 MED ORDER — SODIUM CHLORIDE 0.9 % IJ SOLN
3.0000 mL | Freq: Two times a day (BID) | INTRAMUSCULAR | Status: DC
  Administered 2013-11-10 (×2): 3 mL via INTRAVENOUS

## 2013-11-10 MED ORDER — MORPHINE SULFATE 4 MG/ML IJ SOLN
4.0000 mg | INTRAMUSCULAR | Status: DC | PRN
Start: 2013-11-10 — End: 2013-11-12
  Administered 2013-11-10: 4 mg via INTRAVENOUS
  Filled 2013-11-10: qty 1

## 2013-11-10 MED ORDER — PANTOPRAZOLE SODIUM 40 MG IV SOLR
40.0000 mg | INTRAVENOUS | Status: DC
  Administered 2013-11-10: 40 mg via INTRAVENOUS
  Filled 2013-11-10: qty 40

## 2013-11-10 NOTE — ED Provider Notes (Addendum)
Medical screening examination/treatment/procedure(s) were conducted as a shared visit with non-physician practitioner(s) and myself.  I personally evaluated the patient during the encounter.   EKG Interpretation   Date/Time:  Thursday November 10 2013 02:00:09 EDT Ventricular Rate:  60 PR Interval:  181 QRS Duration: 102 QT Interval:  418 QTC Calculation: 418 R Axis:   0 Text Interpretation:  Sinus rhythm Atrial premature complexes in couplets  Low voltage, precordial leads Confirmed by Sherrel Shafer, MD, Saryna Kneeland ([id number]) on  11/10/2013 2:13:15 AM     Pt comes in with cc of chest pain. Hx of GERD, gastric bypass. Chest pain is midsternal, non radiating. Has some abdominal pain in the back - at  The level of epigastrium I am not sure what the chest pain is from. He is not diabetic, but he has several risk factors and a high HEART score. The pain in the back could be PUD - as he is at increased risk to it from his bypass.  EKG is not showing any acute findings. Trops neg.  Varney Biles, MD 11/10/13 [place], MD 11/10/13 2302

## 2013-11-10 NOTE — ED Provider Notes (Signed)
CSN: [id number]     Arrival date & time 11/10/13  0154 History   First MD Initiated Contact with Patient 11/10/13 0204     Chief Complaint  Patient presents with  . Chest Pain     (Consider location/radiation/quality/duration/timing/severity/associated sxs/prior Treatment) HPI Comments: Patient with history of hypertension, hyperlipidemia, family history of CAD, previous tracheostomy secondary to sleep apnea -- presents with complaint of chest pain which began at rest at approximately 8 PM. Patient developed a heaviness in his chest with radiation of pain between his back. Back pain is an aching pain. Associated with mild shortness of breath. No other radiation. No palpitations. No lightheadedness. No chest pain, nausea, vomiting. Patient took medicines that he typically uses for GERD but these did not help. The onset of this condition was acute. The course is constant. Aggravating factors: none. Alleviating factors: none.    Patient is a 68 y.o. male presenting with chest pain. The history is provided by the patient.  Chest Pain Associated symptoms: back pain   Associated symptoms: no abdominal pain, no cough, no diaphoresis, no fever, no nausea, no palpitations, no shortness of breath and not vomiting     Past Medical History  Diagnosis Date  . Kidney stones   . Hypertension   . Hypercholesteremia   . Sleep apnea   . Osteomyelitis   . 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  Marland Kitchen GERD (gastroesophageal reflux disease)    Past Surgical History  Procedure Laterality Date  . Lithotripsy    . Lithotripsy    . Percutaneous nephrostolithotomy    . Cystoscopy with retrograde pyelogram, ureteroscopy and stent placement Bilateral 06/18/2013    Procedure: CYSTOSCOPY WITH RETROGRADE PYELOGRAM, Left URETEROSCOPY with laser , AND bilateral STENT PLACEMENT;   Surgeon: Alexis Frock, MD;  Location: [facility];  Service: Urology;  Laterality: Bilateral;  . Holmium laser application Bilateral 07/17/8586    Procedure: HOLMIUM LASER APPLICATION;  Surgeon: Alexis Frock, MD;  Location: [facility];  Service: Urology;  Laterality: Bilateral;  . Roux en y      [facility] (weight lost 155 lbs.) Stable -has gained 20 lbs over desired 250#  desired level  . Tracheostomy      '87- s/p tongue and nasal septum deviation and Sleep apnea surgery  . Tonsillectomy      child  . Nephrolithotomy Left 08/08/2013    Procedure: 1ST STAGE NEPHROLITHOTOMY PERCUTANEOUS WITH SURGEON ACCESS;  Surgeon: Alexis Frock, MD;  Location: [facility];  Service: Urology;  Laterality: Left;  . Cystoscopy with retrograde pyelogram, ureteroscopy and stent placement Right 08/08/2013    Procedure: CYSTOSCOPY WITH RIGHT RETROGRADE PYELOGRAM, URETEROSCOPY AND STENT PLACEMENT, stone extraction;  Surgeon: Alexis Frock, MD;  Location: [facility];  Service: Urology;  Laterality: Right;  . Nephrolithotomy Left 08/10/2013    Procedure: NEPHROLITHOTOMY PERCUTANEOUS SECOND LOOK/LEFT DIGITAL URETEROSCOPY/BASKETING OF STONE/EXCHANGE OF LEFT URETERAL STENT;  Surgeon: Alexis Frock, MD;  Location: [facility];  Service: Urology;  Laterality: Left;  . Cystoscopy with retrograde pyelogram, ureteroscopy and stent placement Right 08/10/2013    Procedure: 2ND STAGE CYSTOSCOPY WITH RETROGRADE PYELOGRAM, URETEROSCOPY AND STENT EXCHANGE;  Surgeon: Alexis Frock, MD;  Location: [facility];  Service: Urology;  Laterality: Right;  . Holmium laser application Left 5/0/2774    Procedure: HOLMIUM LASER APPLICATION;  Surgeon: Alexis Frock, MD;  Location: [facility];  Service: Urology;  Laterality: Left;   History reviewed. No pertinent family history. History  Substance Use Topics  . Smoking status: Former Smoker    Types: Cigarettes    Quit date: 05/12/1968  . Smokeless tobacco: Not on file  . Alcohol Use: No    Review of Systems   Constitutional: Negative for fever and diaphoresis.  Eyes: Negative for redness.  Respiratory: Negative for cough and shortness of breath.   Cardiovascular: Positive for chest pain. Negative for palpitations and leg swelling.  Gastrointestinal: Negative for nausea, vomiting and abdominal pain.  Genitourinary: Negative for dysuria.  Musculoskeletal: Positive for back pain. Negative for neck pain.  Skin: Negative for rash.  Neurological: Negative for syncope and light-headedness.    Allergies  Review of patient's allergies indicates no known allergies.  Home Medications   Prior to Admission medications   Medication Sig Start Date End Date Taking? Authorizing Provider  amLODipine (NORVASC) 5 MG tablet Take 1 tablet (5 mg total) by mouth every morning. 09/12/13   Shanda Howells, MD  aspirin EC 81 MG tablet Take 81 mg by mouth daily.    Historical Provider, MD  atorvastatin (LIPITOR) 20 MG tablet Take 3 tablets (60 mg total) by mouth every evening. 09/28/13   Shanda Howells, MD  calcium citrate-vitamin D (CITRACAL+D) 315-200 MG-UNIT per tablet Take 1 tablet by mouth 2 (two) times daily.    Historical Provider, MD  Cholecalciferol (VITAMIN D3) 2000 UNITS capsule Take 4,000 Units by mouth daily.    Historical Provider, MD  gabapentin (NEURONTIN) 600 MG tablet Take 0.5 tablets (300 mg total) by mouth at bedtime. Takes 1/2 tablet 09/12/13   Shanda Howells, MD  Multiple Vitamin (MULTIVITAMIN WITH MINERALS) TABS tablet Take 1 tablet by mouth daily.    Historical Provider, MD  omeprazole (PRILOSEC OTC) 20 MG tablet Take 20 mg by mouth daily.    Historical Provider, MD  senna-docusate (SENOKOT-S) 8.6-50 MG per tablet Take 1 tablet by mouth 2 (two) times daily. While taking pain meds to prevent constipation 08/11/13   Alexis Frock, MD  vitamin B-12 (CYANOCOBALAMIN) 1000 MCG tablet Take 1,000 mcg by mouth daily.    Historical Provider, MD  vitamin C (ASCORBIC ACID) 500 MG tablet Take 500 mg by mouth 2 (two)  times daily.    Historical Provider, MD   BP 193/88  Pulse 69  Temp(Src) 97.7 F (36.5 C) (Oral)  Resp 12  SpO2 100%  Physical Exam  Nursing note and vitals reviewed. Constitutional: He appears well-developed and well-nourished.  HENT:  Head: Normocephalic and atraumatic.  Mouth/Throat: Mucous membranes are normal. Mucous membranes are not dry.  Eyes: Conjunctivae are normal.  Neck: Trachea normal and normal range of motion. Neck supple. Normal carotid pulses and no JVD present. No muscular tenderness present. Carotid bruit is not present. No tracheal deviation present.  Cardiovascular: Normal rate, regular rhythm, S1 normal, S2 normal, normal heart sounds and intact distal pulses.  Exam reveals no distant heart sounds and no decreased pulses.   No murmur heard. Pulses:      Radial pulses are 2+ on the right side, and 2+ on the left side.       Dorsalis pedis pulses are 2+ on the right side, and 2+ on the left side.  Symmetric radial pulses  Pulmonary/Chest: Effort normal and breath sounds normal. No respiratory distress. He has no wheezes. He exhibits no tenderness.  Abdominal: Soft. Normal aorta and bowel sounds are normal. There is no tenderness. There is  no rebound and no guarding.  Musculoskeletal: He exhibits no edema.  Neurological: He is alert.  Skin: Skin is warm and dry. He is not diaphoretic. No cyanosis. No pallor.  Psychiatric: He has a normal mood and affect.    ED Course  Procedures (including critical care time) Labs Review Labs Reviewed  CBC WITH DIFFERENTIAL - Abnormal; Notable for the following:    Neutrophils Relative % 80 (*)    Neutro Abs 8.2 (*)    All other components within normal limits  COMPREHENSIVE METABOLIC PANEL - Abnormal; Notable for the following:    Sodium 135 (*)    Chloride 94 (*)    Glucose, Bld 145 (*)    Creatinine, Ser 1.38 (*)    GFR calc non Af Amer 52 (*)    GFR calc Af Amer 60 (*)    All other components within normal limits   I-STAT TROPOININ, ED    Imaging Review Dg Chest 2 View  11/10/2013   CLINICAL DATA:  Sudden onset chest and back pain  EXAM: CHEST  2 VIEW  COMPARISON:  08/04/2013  FINDINGS: There is a tracheostomy tube which is well seated. Heart size is unchanged and within normal limits. No change in upper mediastinal contours. No consolidation, edema, effusion, or pneumothorax. Surgical clips noted at the GE junction.  IMPRESSION: No active cardiopulmonary disease.   Electronically Signed   By: Jorje Guild M.D.   On: 11/10/2013 02:35     EKG Interpretation   Date/Time:  Thursday November 10 2013 02:00:09 EDT Ventricular Rate:  60 PR Interval:  181 QRS Duration: 102 QT Interval:  418 QTC Calculation: 418 R Axis:   0 Text Interpretation:  Sinus rhythm Atrial premature complexes in couplets  Low voltage, precordial leads Confirmed by NANAVATI, MD, ANKIT ([id number]) on  11/10/2013 2:13:15 AM      Vital signs reviewed and are as follows: Filed Vitals:   11/10/13 0204  BP: 193/88  Pulse: 69  Temp: 97.7 F (36.5 C)  Resp: 12   Patient seen, discussed with Dr. Kathrynn Humble who will see.   3:13 AM Labs reviewed.   4:21 AM Patient seen by Dr. Kathrynn Humble. Plan: nitro for pain, then GI cocktail. Will get delta troponin. Patient wants to be d/c to home if normal. Suspect GERD.   6:56 AM Patient pain is not controlled enough to where he is comfortable with discharge. Given risk factor profile, I agree. Spoke with Dr. Shanon Brow who requests lipase. Someone from [facility] will see patient.   MDM   Final diagnoses:  Chest pain, unspecified chest pain type   Admit for CP.    Carlisle Cater, PA-C 11/10/13 [phone]

## 2013-11-10 NOTE — H&P (Signed)
History and Physical:    Dennis Spencer:[id number] DOB: July 22, 1945 DOA: 11/10/2013  Referring physician: Dr. Kathrynn Humble PCP: Redge Gainer, MD   Chief Complaint: Chest pain  History of Present Illness:   Dennis Spencer is an 68 y.o. male with a PMH of of hypertension, hyperlipidemia, family history of CAD, previous tracheostomy secondary to sleep apnea who presents with non-exertional chest and back pain that began around 8 pm on 11/09/13.  He was watching television when he developed the sudden onset of the pain, which gradually increased in intensity to a level 8-9/10.  He used Prilosec, Pepcid and Mylanta without any relief.  Pain was described as a pressure sensation in the anterior chest, and a sharp pain "like being hit with a stick" in the back.  Pain was associated with shallow breathing, dizziness and lightheadedness but no diaphoresis, palpitations, radiation of pain or nausea/vomiting.  He was given morphine, zofran, NTG, GI cocktail, and IV protonix.  Pain currently rated 2/10.  Had a similar episode 2 weeks ago, but it only lasted 1 hour and was relieved with taking antacids.  He notes that his urologist recently told him to take calcium citrate 2 tabs with each meal and an increase in his dose of Lipitor from 40 to 60 mg. the patient's cardiac risk factors include hypertension, male sex, hypercholesterolemia, obesity, and family history. Although his chest pain is somewhat atypical, he does have EKG changes and a heart score of 5 corresponding with moderate probability of experiencing a major adverse cardiac event within 6 weeks.   ROS:   Constitutional: No fever, no chills;  Appetite normal; No weight loss, no weight gain, no fatigue.  HEENT: No blurry vision, no diplopia, no pharyngitis, no dysphagia CV: + chest pain, no palpitations, no PND, no orthopnea, no edema.  Resp: No SOB, no cough, no pleuritic pain. GI: No nausea, no vomiting, no diarrhea, no melena, no hematochezia,  no constipation, no abdominal pain.  GU: No dysuria, no hematuria, no frequency, no urgency. MSK: no myalgias, no arthralgias.  Neuro:  No headache, no focal neurological deficits, no history of seizures.  Psych: No depression, no anxiety.  Endo: No heat intolerance, no cold intolerance, no polyuria, no polydipsia  Skin: No rashes, no skin lesions.  Heme: No easy bruising.  Travel history: No recent travel, except to beach in [place].   Past Medical History:   Past Medical History  Diagnosis Date  . Kidney stones   . Hypertension   . Hypercholesteremia   . Sleep apnea     s/p tracheostomy  . Osteomyelitis     LLE age 85  . 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  Marland Kitchen GERD (gastroesophageal reflux disease)   . Bursitis of left shoulder     Takes neurontin for this.  . CKD (chronic kidney disease), stage III 11/10/2013    Past Surgical History:   Past Surgical History  Procedure Laterality Date  . Lithotripsy    . Lithotripsy    . Percutaneous nephrostolithotomy    . Cystoscopy with retrograde pyelogram, ureteroscopy and stent placement Bilateral 06/18/2013    Procedure: CYSTOSCOPY WITH RETROGRADE PYELOGRAM, Left URETEROSCOPY with laser , AND bilateral STENT PLACEMENT;  Surgeon: Alexis Frock, MD;  Location: [facility];  Service: Urology;  Laterality: Bilateral;  . Holmium laser application Bilateral 12/10/1912  Procedure: HOLMIUM LASER APPLICATION;  Surgeon: Alexis Frock, MD;  Location: [facility];  Service: Urology;  Laterality: Bilateral;  . Roux en y      [facility] (weight lost 155 lbs.) Stable -has gained 20 lbs over desired 250#  desired level  . Tracheostomy      '87- s/p tongue and nasal septum deviation and Sleep apnea surgery  . Tonsillectomy      child  . Nephrolithotomy Left 08/08/2013    Procedure: 1ST STAGE NEPHROLITHOTOMY PERCUTANEOUS  WITH SURGEON ACCESS;  Surgeon: Alexis Frock, MD;  Location: [facility];  Service: Urology;  Laterality: Left;  . Cystoscopy with retrograde pyelogram, ureteroscopy and stent placement Right 08/08/2013    Procedure: CYSTOSCOPY WITH RIGHT RETROGRADE PYELOGRAM, URETEROSCOPY AND STENT PLACEMENT, stone extraction;  Surgeon: Alexis Frock, MD;  Location: [facility];  Service: Urology;  Laterality: Right;  . Nephrolithotomy Left 08/10/2013    Procedure: NEPHROLITHOTOMY PERCUTANEOUS SECOND LOOK/LEFT DIGITAL URETEROSCOPY/BASKETING OF STONE/EXCHANGE OF LEFT URETERAL STENT;  Surgeon: Alexis Frock, MD;  Location: [facility];  Service: Urology;  Laterality: Left;  . Cystoscopy with retrograde pyelogram, ureteroscopy and stent placement Right 08/10/2013    Procedure: 2ND STAGE CYSTOSCOPY WITH RETROGRADE PYELOGRAM, URETEROSCOPY AND STENT EXCHANGE;  Surgeon: Alexis Frock, MD;  Location: [facility];  Service: Urology;  Laterality: Right;  . Holmium laser application Left 06/16/5807    Procedure: HOLMIUM LASER APPLICATION;  Surgeon: Alexis Frock, MD;  Location: [facility];  Service: Urology;  Laterality: Left;  . Gastric bypass  2004    Social History:   History   Social History  . Marital Status: Married    Spouse Name: Helene Kelp    Number of Children: 3  . Years of Education: N/A   Occupational History  . Retired, English as a second language teacher.    Social History Main Topics  . Smoking status: Former Smoker    Types: Cigarettes    Quit date: 05/12/1968  . Smokeless tobacco: Not on file  . Alcohol Use: No  . Drug Use: No  . Sexual Activity: Yes   Other Topics Concern  . Not on file   Social History Narrative   Married.      Family history:   Family History  Problem Relation Age of Onset  . Diabetes Father   . Hypertension Father   . Heart failure Father     Died of MI age 7.  . Cancer Mother     Ovarian, died age 11.    Allergies   Review of patient's allergies indicates no known allergies.  Current Medications:    Prior to Admission medications   Medication Sig Start Date End Date Taking? Authorizing Provider  amLODipine (NORVASC) 5 MG tablet Take 1 tablet (5 mg total) by mouth every morning. 09/12/13  Yes Shanda Howells, MD  aspirin EC 81 MG tablet Take 81 mg by mouth daily.   Yes Historical Provider, MD  atorvastatin (LIPITOR) 20 MG tablet Take 3 tablets (60 mg total) by mouth every evening. 09/28/13  Yes Shanda Howells, MD  calcium citrate-vitamin D (CITRACAL+D) 315-200 MG-UNIT per tablet Take 2 tablets by mouth 3 (three) times daily.    Yes Historical Provider, MD  Cholecalciferol (VITAMIN D3) 2000 UNITS capsule Take 4,000 Units by mouth daily.   Yes Historical Provider, MD  gabapentin (NEURONTIN) 600 MG tablet Take 300 mg by mouth at bedtime.   Yes Historical Provider, MD  Multiple Vitamin (MULTIVITAMIN WITH MINERALS) TABS tablet Take 1 tablet by mouth daily.   Yes Historical Provider,  MD  omeprazole (PRILOSEC OTC) 20 MG tablet Take 20 mg by mouth daily.   Yes Historical Provider, MD  vitamin B-12 (CYANOCOBALAMIN) 1000 MCG tablet Take 1,000 mcg by mouth daily.   Yes Historical Provider, MD  vitamin C (ASCORBIC ACID) 500 MG tablet Take 500 mg by mouth 2 (two) times daily.   Yes Historical Provider, MD    Physical Exam:   Filed Vitals:   11/10/13 0700 11/10/13 0717 11/10/13 0718 11/10/13 0719  BP: 166/61     Pulse:  72 60 55  Temp:      TempSrc:      Resp:  14 13 17   SpO2:   96% 93%     Physical Exam: Blood pressure 166/61, pulse 55, temperature 97.7 F (36.5 C), temperature source Oral, resp. rate 17, SpO2 93.00%. Gen: No acute distress. Head: Normocephalic, atraumatic. Eyes: PERRL, EOMI, sclerae nonicteric. Mouth: Oropharynx clear with no tonsillar or posterior pharyngeal exudates. Neck: Supple, no thyromegaly, no lymphadenopathy, no jugular venous distention.  Tracheostomy present. Chest: Lungs clear to auscultation bilaterally. CV: Heart sounds are bradycardic. No murmurs, rubs, or  gallops. Abdomen: Soft, nontender, nondistended with normal active bowel sounds. Extremities: Extremities show 1+ edema bilaterally. Some venous stasis changes noted. Skin: Warm and dry. Neuro: Alert and oriented times 3; cranial nerves II through XII grossly intact. Psych: Mood and affect normal.   Data Review:    Labs: Basic Metabolic Panel:  Recent Labs Lab 11/10/13 0222  NA 135*  K 4.4  CL 94*  CO2 27  GLUCOSE 145*  BUN 18  CREATININE 1.38*  CALCIUM 10.4   Liver Function Tests:  Recent Labs Lab 11/10/13 0222  AST 25  ALT 19  ALKPHOS 85  BILITOT 0.8  PROT 8.2  ALBUMIN 4.5   CBC:  Recent Labs Lab 11/10/13 0222  WBC 10.3  NEUTROABS 8.2*  HGB 16.3  HCT 45.6  MCV 90.3  PLT 233   Cardiac Enzymes: No results found for this basename: CKTOTAL, CKMB, CKMBINDEX, TROPONINI,  in the last 168 hours  BNP (last 3 results) No results found for this basename: PROBNP,  in the last 8760 hours CBG: No results found for this basename: GLUCAP,  in the last 168 hours  Radiographic Studies: Dg Chest 2 View  11/10/2013   CLINICAL DATA:  Sudden onset chest and back pain  EXAM: CHEST  2 VIEW  COMPARISON:  08/04/2013  FINDINGS: There is a tracheostomy tube which is well seated. Heart size is unchanged and within normal limits. No change in upper mediastinal contours. No consolidation, edema, effusion, or pneumothorax. Surgical clips noted at the GE junction.  IMPRESSION: No active cardiopulmonary disease.   Electronically Signed   By: Jorje Guild M.D.   On: 11/10/2013 02:35    EKG: Independently reviewed. Sinus bradycardia, premature atrial contractions in couplets.   Assessment/Plan:   Principal Problem:   Atypical chest pain with bradycardia and EKG abnormalities  Admitted to telemetry. Cycle cardiac markers q. 6 hours x3 sets.  Check fasting lipid panel in the morning.  Continue aspirin and Lipitor.  Cardiology consultation requested given risk factor  profile, heart score of 5 and EKG abnormalities.  Active Problems:   GERD (gastroesophageal reflux disease)  We'll place on IV Protonix and GI cocktails as needed.  Lipase not elevated.      OSA (obstructive sleep apnea)  The patient is status post tracheostomy.    CKD (chronic kidney disease), stage III  Creatinine currently at usual baseline  values.    Hypertension  Continue Norvasc.    Hypercholesteremia  Continue Lipitor. Check fasting lipid panel in the morning.    Hyperglycemia  Check hemoglobin A1c.    DVT prophylaxis  Lovenox ordered.  Code Status: Full. Family Communication: Bert Ptacek (wife) Disposition Plan: Home when stable.  Time spent: 1 hour.  RAMA,CHRISTINA [facility] Pager [phone] Cell: [phone]   If 7PM-7AM, please contact night-coverage [email or web address] Password [email or web address] 11/10/2013, 7:58 AM    **Disclaimer: This note was dictated with voice recognition software. Similar sounding words can inadvertently be transcribed and this note may contain transcription errors which may not have been corrected upon publication of note.**

## 2013-11-10 NOTE — ED Notes (Signed)
Pt presents to ED with c/o central chest pain and back pain.  Pt reports that he was just sitting when chest pain started.  Pt states he has some shortness of breath; pt denies headache or dizziness. Pt denies pain radiation.

## 2013-11-10 NOTE — Consult Note (Signed)
Admit date: 11/10/2013 Referring Physician  Dr. Rockne Menghini Primary Physician Redge Gainer, MD Primary Cardiologist  Dr. Percival Spanish Reason for Consultation  Chest pain and bradycardia  HPI: 68 year old male with history of hypertension, hyperlipidemia (on Lipitor), family history of coronary artery disease, sleep apnea with previous tracheostomy admitted with nonexertional chest/back pain that began in the evening of 11/09/2013.  States that he was sitting, developed sudden onset discomfort, gradually increasing to 9/10. He described it as sharp, anterior pressure-like sensation also "like being hit with a stick "in the back. He thought that he may have had some shallow breathing with it as well as lightheadedness but denies any vomiting, palpitations or diaphoresis. His discomfort may have changed slightly when he changed positions. Did not seem to be worsened with deep breathing. No fevers. No cough. In the emergency room this morning his pain was approximately 2/10 after IV Protonix, GI cocktail, morphine, Zofran, nitroglycerin. Typical GI medication such as Prilosec or Mylanta did not seem to help with the discomfort at home.  Approximately 2 weeks ago, he had a similar chest pain episode but it seemed to be relieved after taking antacids and only lasted one hour.  While in the emergency department he was noted to have sinus bradycardia and premature atrial contractions. EKG on 11/10/13 at 2 AM demonstrates sinus rhythm, PAC, sinus arrhythmia, no ST segment changes concerning for ischemia.    PMH:   Past Medical History  Diagnosis Date  . Kidney stones   . Hypertension   . Hypercholesteremia   . Sleep apnea     s/p tracheostomy  . Osteomyelitis     LLE age 17  . 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  Marland Kitchen GERD (gastroesophageal reflux disease)   .  Bursitis of left shoulder     Takes neurontin for this.  . CKD (chronic kidney disease), stage III 11/10/2013    PSH:   Past Surgical History  Procedure Laterality Date  . Lithotripsy    . Lithotripsy    . Percutaneous nephrostolithotomy    . Cystoscopy with retrograde pyelogram, ureteroscopy and stent placement Bilateral 06/18/2013    Procedure: CYSTOSCOPY WITH RETROGRADE PYELOGRAM, Left URETEROSCOPY with laser , AND bilateral STENT PLACEMENT;  Surgeon: Alexis Frock, MD;  Location: [facility];  Service: Urology;  Laterality: Bilateral;  . Holmium laser application Bilateral 01/18/8337    Procedure: HOLMIUM LASER APPLICATION;  Surgeon: Alexis Frock, MD;  Location: [facility];  Service: Urology;  Laterality: Bilateral;  . Roux en y      [facility] (weight lost 155 lbs.) Stable -has gained 20 lbs over desired 250#  desired level  . Tracheostomy      '87- s/p tongue and nasal septum deviation and Sleep apnea surgery  . Tonsillectomy      child  . Nephrolithotomy Left 08/08/2013    Procedure: 1ST STAGE NEPHROLITHOTOMY PERCUTANEOUS WITH SURGEON ACCESS;  Surgeon: Alexis Frock, MD;  Location: [facility];  Service: Urology;  Laterality: Left;  . Cystoscopy with retrograde pyelogram, ureteroscopy and stent placement Right 08/08/2013    Procedure: CYSTOSCOPY WITH RIGHT RETROGRADE PYELOGRAM, URETEROSCOPY AND STENT PLACEMENT, stone extraction;  Surgeon: Alexis Frock, MD;  Location: [facility];  Service: Urology;  Laterality: Right;  . Nephrolithotomy Left 08/10/2013    Procedure: NEPHROLITHOTOMY PERCUTANEOUS SECOND LOOK/LEFT DIGITAL URETEROSCOPY/BASKETING OF STONE/EXCHANGE OF LEFT URETERAL STENT;  Surgeon: Alexis Frock, MD;  Location: [facility];  Service: Urology;  Laterality: Left;  . Cystoscopy with retrograde pyelogram, ureteroscopy and stent placement Right 08/10/2013    Procedure: 2ND STAGE CYSTOSCOPY WITH RETROGRADE PYELOGRAM, URETEROSCOPY AND STENT EXCHANGE;  Surgeon: Alexis Frock, MD;  Location: [facility];   Service: Urology;  Laterality: Right;  . Holmium laser application Left 09/09/256    Procedure: HOLMIUM LASER APPLICATION;  Surgeon: Alexis Frock, MD;  Location: [facility];  Service: Urology;  Laterality: Left;  . Gastric bypass  2004   Allergies:  Review of patient's allergies indicates no known allergies. Prior to Admit Meds:   Prior to Admission medications   Medication Sig Start Date End Date Taking? Authorizing Provider  amLODipine (NORVASC) 5 MG tablet Take 1 tablet (5 mg total) by mouth every morning. 09/12/13  Yes Shanda Howells, MD  aspirin EC 81 MG tablet Take 81 mg by mouth daily.   Yes Historical Provider, MD  atorvastatin (LIPITOR) 20 MG tablet Take 3 tablets (60 mg total) by mouth every evening. 09/28/13  Yes Shanda Howells, MD  calcium citrate-vitamin D (CITRACAL+D) 315-200 MG-UNIT per tablet Take 2 tablets by mouth 3 (three) times daily.    Yes Historical Provider, MD  Cholecalciferol (VITAMIN D3) 2000 UNITS capsule Take 4,000 Units by mouth daily.   Yes Historical Provider, MD  gabapentin (NEURONTIN) 600 MG tablet Take 300 mg by mouth at bedtime.   Yes Historical Provider, MD  Multiple Vitamin (MULTIVITAMIN WITH MINERALS) TABS tablet Take 1 tablet by mouth daily.   Yes Historical Provider, MD  omeprazole (PRILOSEC OTC) 20 MG tablet Take 20 mg by mouth daily.   Yes Historical Provider, MD  vitamin B-12 (CYANOCOBALAMIN) 1000 MCG tablet Take 1,000 mcg by mouth daily.   Yes Historical Provider, MD  vitamin C (ASCORBIC ACID) 500 MG tablet Take 500 mg by mouth 2 (two) times daily.   Yes Historical Provider, MD   Fam HX:    Family History  Problem Relation Age of Onset  . Diabetes Father   . Hypertension Father   . Heart failure Father     Died of MI age 22.  . Cancer Mother     Ovarian, died age 50.   Social HX:    History   Social History  . Marital Status: Married    Spouse Name: Helene Kelp    Number of Children: 3  . Years of Education: N/A   Occupational History  .  Retired, English as a second language teacher.    Social History Main Topics  . Smoking status: Former Smoker    Types: Cigarettes    Quit date: 05/12/1968  . Smokeless tobacco: Never Used  . Alcohol Use: No  . Drug Use: No  . Sexual Activity: Yes   Other Topics Concern  . Not on file   Social History Narrative   Married.       ROS:  All 11 ROS were addressed and are negative except what is stated in the HPI   Physical Exam: Blood pressure 181/63, pulse 51, temperature 98.7 F (37.1 C), temperature source Oral, resp. rate 19, height 5\' 10"  (1.778 m), weight 293 lb 10.4 oz (133.2 kg), SpO2 94.00%.   General: Well developed, well nourished, in no acute distress Head: Eyes PERRLA, No xanthomas.   Normal cephalic and atramatic, tracheostomy Lungs:   Clear bilaterally to auscultation and percussion. Normal respiratory effort. No wheezes, no rales. Heart:   HRRR S1 S2 Pulses are 2+ & equal. No murmur, rubs, gallops.  No carotid bruit. No JVD.  No abdominal bruits. Distant heart sounds Abdomen: Bowel sounds are positive, abdomen soft and non-tender without masses. No hepatosplenomegaly. Obesity Msk:  Back normal. Normal strength and tone for age. Extremities:  No clubbing, cyanosis or edema.  DP +1 Neuro: Alert and oriented X 3, non-focal, MAE x 4 GU: Deferred Rectal: Deferred Psych:  Good affect, responds appropriately      Labs: Lab Results  Component Value Date   WBC 10.3 11/10/2013   HGB 16.3 11/10/2013   HCT 45.6 11/10/2013   MCV 90.3 11/10/2013   PLT 233 11/10/2013     Recent Labs Lab 11/10/13 0222  NA 135*  K 4.4  CL 94*  CO2 27  BUN 18  CREATININE 1.38*  CALCIUM 10.4  PROT 8.2  BILITOT 0.8  ALKPHOS 85  ALT 19  AST 25  GLUCOSE 145*    Recent Labs  11/10/13 0923  TROPONINI <0.30   Lab Results  Component Value Date   CHOL 156 09/12/2013   HDL 43 09/12/2013   LDLCALC 92 09/12/2013   TRIG 107 09/12/2013   No results found for this basename: DDIMER     Radiology:  Dg Chest 2  View  11/10/2013   CLINICAL DATA:  Sudden onset chest and back pain  EXAM: CHEST  2 VIEW  COMPARISON:  08/04/2013  FINDINGS: There is a tracheostomy tube which is well seated. Heart size is unchanged and within normal limits. No change in upper mediastinal contours. No consolidation, edema, effusion, or pneumothorax. Surgical clips noted at the GE junction.  IMPRESSION: No active cardiopulmonary disease.   Electronically Signed   By: Jorje Guild M.D.   On: 11/10/2013 02:35   Personally viewed.  EKG:  As described above in history of present illness. Personally viewed.   ASSESSMENT/PLAN:   68 year old male with hypertension, hyperlipidemia, obesity status post gastric bypass surgery, obstructive sleep apnea status post tracheostomy admitted with atypical chest pain, abnormal EKG with PACs/bradycardia.  1. Atypical chest pain- with his multitude of cardiac risk factors, I would like to proceed with nuclear stress test, pharmacologic, 2 day protocol. He is currently n.p.o. I discussed with his nurse. Hopefully, he can get one part of the study done today. He would need to be transported to [facility] hospital. Also believe allergies other than the heart include gastrointestinal discomfort or perhaps musculoskeletal discomfort. He did state that he felt some Aleve or change in the overall severity of the discomfort when he changed positions. Troponins are currently reassuring, EKG currently reassuring.  2. Hyperlipidemia-continue with atorvastatin  3. Hypertension-elevated currently, continue to treat  4. Morbid obesity-status post gastric bypass. Continue to encourage weight loss.  5. Obstructive sleep apnea-severe, status post tracheostomy.  6. Bradycardia-transient seen in the emergency department. Possibly exacerbated by a vagal phenomenon in the setting of abdominal discomfort. He has not had any high-risk symptoms at home such as syncope. He is currently not on a medications that should cause  bradycardia.  7. Premature atrial contractions-likely benign.  8. GERD-currently on IV Protonix. GI cocktail. Per primary team.  Candee Furbish, MD  11/10/2013  10:52 AM

## 2013-11-11 ENCOUNTER — Ambulatory Visit (HOSPITAL_COMMUNITY)
Admit: 2013-11-11 | Discharge: 2013-11-11 | Disposition: A | Discharge status: Home / Self Care | Payer: Medicare HMO | Attending: Cardiology | Admitting: Cardiology

## 2013-11-11 ENCOUNTER — Other Ambulatory Visit: Payer: Self-pay

## 2013-11-11 LAB — BASIC METABOLIC PANEL
ANION GAP: 10 (ref 5–15)
BUN: 20 mg/dL (ref 6–23)
CO2: 28 meq/L (ref 19–32)
CREATININE: 1.45 mg/dL — AB (ref 0.50–1.35)
Calcium: 9.2 mg/dL (ref 8.4–10.5)
Chloride: 98 mEq/L (ref 96–112)
GFR calc Af Amer: 56 mL/min — ABNORMAL LOW (ref >90)
GFR calc non Af Amer: 49 mL/min — ABNORMAL LOW (ref >90)
Glucose, Bld: 112 mg/dL — ABNORMAL HIGH (ref 70–99)
Potassium: 4.5 mEq/L (ref 3.7–5.3)
SODIUM: 136 meq/L — AB (ref 137–147)

## 2013-11-11 LAB — LIPID PANEL
CHOL/HDL RATIO: 3 ratio
CHOLESTEROL: 125 mg/dL (ref 0–200)
HDL: 41 mg/dL (ref >39)
LDL Cholesterol: 69 mg/dL (ref 0–99)
Triglycerides: 76 mg/dL (ref <150)
VLDL: 15 mg/dL (ref 0–40)

## 2013-11-11 LAB — TROPONIN I
Troponin I: <0.3 ng/mL (ref <0.30)
Troponin I: <0.3 ng/mL (ref <0.30)
Troponin I: <0.3 ng/mL (ref <0.30)

## 2013-11-11 MED ORDER — REGADENOSON 0.4 MG/5ML IV SOLN
0.4000 mg | Freq: Once | INTRAVENOUS | Status: AC
  Administered 2013-11-11: 0.4 mg via INTRAVENOUS

## 2013-11-11 MED ORDER — PANTOPRAZOLE SODIUM 40 MG PO TBEC
40.0000 mg | DELAYED_RELEASE_TABLET | Freq: Every day | ORAL | Status: DC
  Administered 2013-11-11 – 2013-11-12 (×2): 40 mg via ORAL
  Filled 2013-11-11 (×2): qty 1

## 2013-11-11 MED ORDER — ENOXAPARIN SODIUM 60 MG/0.6ML ~~LOC~~ SOLN
60.0000 mg | SUBCUTANEOUS | Status: DC
  Administered 2013-11-11: 60 mg via SUBCUTANEOUS
  Filled 2013-11-11 (×3): qty 0.6

## 2013-11-11 MED ORDER — REGADENOSON 0.4 MG/5ML IV SOLN
INTRAVENOUS | Status: AC
  Filled 2013-11-11: qty 5

## 2013-11-11 NOTE — Progress Notes (Signed)
Pharmacy - Brief Note  The patient is receiving Protonix by the intravenous route.  Based on criteria approved by the Pharmacy and [place], the medication is being converted to the equivalent oral dose form.  These criteria include: -No Active GI bleeding -Able to tolerate diet of full liquids (or better) or tube feeding -Able to tolerate other medications by the oral or enteral route  If you have any questions about this conversion, please contact the Pharmacy Department (phone [phone]).  Thank you.  Doreene Eland, PharmD, BCPS.   Pager: [phone] 11/11/2013 .11:18 AM

## 2013-11-11 NOTE — Progress Notes (Signed)
Progress Note   Dennis Spencer DDU:[id number] DOB: 06/10/1945 DOA: 11/10/2013 PCP: Redge Gainer, MD   Brief Narrative:   Dennis Spencer is an 68 y.o. male with a PMH of of hypertension, hyperlipidemia, family history of CAD, previous tracheostomy secondary to sleep apnea who was admitted 11/10/13 with atypical chest pain. Cardiology on board with plans for stress testing.   Assessment/Plan:   Principal Problem:  Atypical chest pain with bradycardia and EKG abnormalities   Admitted to SDU. Troponins negative x3 sets.   Fasting lipid panel shows good lipid control.   Continue aspirin and Lipitor.   Seen by cardiologist 11/10/13, for nuclear stress test/2 day protocol.  Active Problems:  GERD (gastroesophageal reflux disease)   Continue IV Protonix and GI cocktails as needed.   OSA (obstructive sleep apnea)   The patient is status post tracheostomy.  CKD (chronic kidney disease), stage III   Creatinine currently at usual baseline values.  Hypertension   Continue Norvasc.  Hypercholesteremia   Continue Lipitor. Good lipid control.  Hyperglycemia   Hemoglobin A1c 5.7% corresponding to an increased risk of developing diabetes.  Will get dietitian consult for diet counseling.  DVT prophylaxis  Lovenox ordered.  Code Status: Full.  Family Communication: Dennis Spencer (wife) updated at bedside. Disposition Plan: Home when stable.    IV Access:    Peripheral IV   Procedures:    Stress test.   Medical Consultants:    Dr. Candee Furbish, Cardiology.   Other Consultants:    Dietician  Anti-Infectives:    None.  Subjective:    Dennis Spencer tells me his chest pain resolved yesterday, and he has not had any since.  Had a bit of chest tightness during stress test.  No nausea or vomiting.  Objective:    Filed Vitals:   11/11/13 0427 11/11/13 0435 11/11/13 0740 11/11/13 1100  BP:  122/56 129/51 138/69  Pulse: 65 62 75 74  Temp:     98 F (36.7 C)  TempSrc:    Oral  Resp: 11 8 17 17   Height:      Weight:      SpO2: 94% 96% 100% 100%    Intake/Output Summary (Last 24 hours) at 11/11/13 1339 Last data filed at 11/10/13 2200  Gross per 24 hour  Intake    480 ml  Output   1250 ml  Net   -770 ml    Exam: Gen:  NAD Cardiovascular:  RRR, No M/R/G Respiratory:  Lungs CTAB Gastrointestinal:  Abdomen soft, NT/ND, + BS Extremities:  No C/E/C   Data Reviewed:    Labs: Basic Metabolic Panel:  Recent Labs Lab 11/10/13 0222 11/11/13 0345  NA 135* 136*  K 4.4 4.5  CL 94* 98  CO2 27 28  GLUCOSE 145* 112*  BUN 18 20  CREATININE 1.38* 1.45*  CALCIUM 10.4 9.2   GFR Estimated Creatinine Clearance: 68.3 ml/min (by C-G formula based on Cr of 1.45). Liver Function Tests:  Recent Labs Lab 11/10/13 0222  AST 25  ALT 19  ALKPHOS 85  BILITOT 0.8  PROT 8.2  ALBUMIN 4.5    Recent Labs Lab 11/10/13 0625  LIPASE 14   CBC:  Recent Labs Lab 11/10/13 0222  WBC 10.3  NEUTROABS 8.2*  HGB 16.3  HCT 45.6  MCV 90.3  PLT 233   Cardiac Enzymes:  Recent Labs Lab 11/10/13 0923 11/11/13 0742 11/11/13 1235  TROPONINI <0.30 <0.30 <0.30   Hgb A1c  Recent Labs  11/10/13 0923  HGBA1C 5.7*   Lipid Profile  Recent Labs  11/11/13 0345  CHOL 125  HDL 41  LDLCALC 69  TRIG 76  CHOLHDL 3.0   Thyroid function studies  Recent Labs  11/10/13 0923  TSH 2.410   Microbiology Recent Results (from the past 240 hour(s))  MRSA PCR SCREENING     Status: None   Collection Time    11/10/13 10:44 AM      Result Value Ref Range Status   MRSA by PCR NEGATIVE  NEGATIVE Final   Comment:            The GeneXpert MRSA Assay (FDA     approved for NASAL specimens     only), is one component of a     comprehensive MRSA colonization     surveillance program. It is not     intended to diagnose MRSA     infection nor to guide or     monitor treatment for     MRSA infections.       Radiographs/Studies:   Dg Chest 2 View  11/10/2013   CLINICAL DATA:  Sudden onset chest and back pain  EXAM: CHEST  2 VIEW  COMPARISON:  08/04/2013  FINDINGS: There is a tracheostomy tube which is well seated. Heart size is unchanged and within normal limits. No change in upper mediastinal contours. No consolidation, edema, effusion, or pneumothorax. Surgical clips noted at the GE junction.  IMPRESSION: No active cardiopulmonary disease.   Electronically Signed   By: Jorje Guild M.D.   On: 11/10/2013 02:35    Medications:   . amLODipine  5 mg Oral q morning - 10a  . aspirin EC  81 mg Oral Daily  . atorvastatin  60 mg Oral QPM  . calcium-vitamin D  1 tablet Oral TID WC  . cholecalciferol  4,000 Units Oral Daily  . enoxaparin (LOVENOX) injection  40 mg Subcutaneous Q24H  . gabapentin  300 mg Oral QHS  . multivitamin with minerals  1 tablet Oral Daily  . pantoprazole  40 mg Oral Daily  . sodium chloride  3 mL Intravenous Q12H  . sodium chloride  3 mL Intravenous Q12H  . vitamin B-12  1,000 mcg Oral Daily  . vitamin C  500 mg Oral BID   Continuous Infusions:   Time spent: 25 minutes.   LOS: 1 day   Sophiarose Eades  [facility] Pager [phone]. If unable to reach me by pager, please call my cell phone at [phone].  *Please refer to [email or web address], password [email or web address] to get updated schedule on who will round on this patient, as hospitalists switch teams weekly. If 7PM-7AM, please contact night-coverage at [email or web address], password [email or web address] for any overnight needs.  11/11/2013, 1:39 PM    **Disclaimer: This note was dictated with voice recognition software. Similar sounding words can inadvertently be transcribed and this note may contain transcription errors which may not have been corrected upon publication of note.**

## 2013-11-11 NOTE — Progress Notes (Signed)
Nutrition Education Note  RD consulted for HbA1c 5.7% and increased risk of developing of diabetes.   Lab Results  Component Value Date   HGBA1C 5.7* 11/10/2013    RD provided general healthy eating handout from [employer]. Discussed different food groups and their effects on blood sugar, emphasizing carbohydrate-containing foods. Provided list of carbohydrates and recommended serving sizes of common foods.  Discussed importance of controlled and consistent carbohydrate intake throughout the day. Provided examples of ways to balance meals/snacks and encouraged intake of high-fiber, whole grain complex carbohydrates. Teach back method used.  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 Has been taking all of recommended bariatric vitamins which were discussed with pt. Reports adequate protein intake in diet. Admits to skipping lunch sometimes and not getting enough physical activity since retiring. Drinks water and diet green tea. States he is going to try to add more fruits and vegetables to his diet.   Expect good compliance.  Body mass index is 41.57 kg/(m^2). Pt meets criteria for morbid obesity based on current BMI.  Current diet order is heart healthy, patient is consuming approximately 100% of meals at this time. Labs and medications reviewed. No further nutrition interventions warranted at this time. RD contact information provided. If additional nutrition issues arise, please re-consult RD.  Dennis Stable MS, [place], LDN [phone] Pager [phone] Weekend/After Hours Pager

## 2013-11-11 NOTE — Progress Notes (Signed)
   Patient has gone to [facility] for myoview study   Ramond Dial., MD, [facility] 11/11/2013, 8:43 AM [address]  [place] Pager [phone]

## 2013-11-12 ENCOUNTER — Encounter (HOSPITAL_COMMUNITY): Payer: Self-pay | Admitting: Internal Medicine

## 2013-11-12 DIAGNOSIS — R079 Chest pain, unspecified: Secondary | ICD-10-CM

## 2013-11-12 DIAGNOSIS — I059 Rheumatic mitral valve disease, unspecified: Secondary | ICD-10-CM

## 2013-11-12 DIAGNOSIS — R9439 Abnormal result of other cardiovascular function study: Secondary | ICD-10-CM

## 2013-11-12 HISTORY — DX: Morbid (severe) obesity due to excess calories: E66.01

## 2013-11-12 MED ORDER — TECHNETIUM TC 99M SESTAMIBI GENERIC - CARDIOLITE
30.0000 | Freq: Once | INTRAVENOUS | Status: AC | PRN
  Administered 2013-11-12: 30 via INTRAVENOUS

## 2013-11-12 MED ORDER — TECHNETIUM TC 99M SESTAMIBI GENERIC - CARDIOLITE
30.0000 | Freq: Once | INTRAVENOUS | Status: AC | PRN
  Administered 2013-11-11: 30 via INTRAVENOUS

## 2013-11-12 NOTE — Progress Notes (Signed)
Patient ID: Dennis Spencer, male   DOB: 1946/03/22, 68 y.o.   MRN: [id number]     Subjective:    No chest pain or SOB overnight  Objective:   Temp:  [97.8 F (36.6 C)-98.3 F (36.8 C)] 98.3 F (36.8 C) (07/04 0535) Pulse Rate:  [64-102] 70 (07/04 0535) Resp:  [16-18] 18 (07/04 0535) BP: (122-149)/(50-74) 122/59 mmHg (07/04 0535) SpO2:  [95 %-100 %] 98 % (07/04 0535) Last BM Date: 11/11/13  Filed Weights   11/10/13 1048 11/11/13 0400  Weight: 293 lb 10.4 oz (133.2 kg) 289 lb 11 oz (131.4 kg)   No intake or output data in the 24 hours ending 11/12/13 0746  Telemetry: NSR, sinus tach  Exam:  General:NACH  Resp: Lungs clear  Cardiac: RRR, no m/r/g, no JVD  GI: abdomen soft, NT, ND  MSK: no LE edema  Neuro: no focal deficits  Psych: appropriate affect  Lab Results:  Basic Metabolic Panel:  Recent Labs Lab 11/10/13 0222 11/11/13 0345  NA 135* 136*  K 4.4 4.5  CL 94* 98  CO2 27 28  GLUCOSE 145* 112*  BUN 18 20  CREATININE 1.38* 1.45*  CALCIUM 10.4 9.2    Liver Function Tests:  Recent Labs Lab 11/10/13 0222  AST 25  ALT 19  ALKPHOS 85  BILITOT 0.8  PROT 8.2  ALBUMIN 4.5    CBC:  Recent Labs Lab 11/10/13 0222  WBC 10.3  HGB 16.3  HCT 45.6  MCV 90.3  PLT 233    Cardiac Enzymes:  Recent Labs Lab 11/11/13 0742 11/11/13 1235 11/11/13 1910  TROPONINI <0.30 <0.30 <0.30    BNP: No results found for this basename: PROBNP,  in the last 8760 hours  Coagulation: No results found for this basename: INR,  in the last 168 hours  ECG:   Medications:   Scheduled Medications: . amLODipine  5 mg Oral q morning - 10a  . aspirin EC  81 mg Oral Daily  . atorvastatin  60 mg Oral QPM  . calcium-vitamin D  1 tablet Oral TID WC  . cholecalciferol  4,000 Units Oral Daily  . enoxaparin (LOVENOX) injection  60 mg Subcutaneous Q24H  . gabapentin  300 mg Oral QHS  . multivitamin with minerals  1 tablet Oral Daily  . pantoprazole  40 mg Oral  Daily  . sodium chloride  3 mL Intravenous Q12H  . sodium chloride  3 mL Intravenous Q12H  . vitamin B-12  1,000 mcg Oral Daily  . vitamin C  500 mg Oral BID     Infusions:     PRN Medications:  sodium chloride, acetaminophen, acetaminophen, alum & mag hydroxide-simeth, gi cocktail, morphine injection, ondansetron (ZOFRAN) IV, ondansetron, oxyCODONE, sodium chloride     Assessment/Plan   68 year old male with history of hypertension, hyperlipidemia (on Lipitor), family history of coronary artery disease, sleep apnea with previous tracheostomy admitted with nonexertional chest/back pain that began in the evening of 11/09/2013.   1. Atypical chest pain - no evidence of ACS by EKG or cardiac enzymes - no recurrent symptoms overnight - awaiting 2nd part of myoview today, further management pending those results. Will obtain echo.         Carlyle Dolly, M.D., F.A.C.C.

## 2013-11-12 NOTE — Progress Notes (Signed)
Called [facility] at [facility], they can perform pt's rest images whenever [facility] can deliver pt.  Called [facility], spoke with Baxter Flattery - she advised that the next truck available would be between 9-9:30am.  [facility] advised of this, they are agreeable.  Pt updated.  Coolidge Breeze, RN 11/12/2013

## 2013-11-12 NOTE — Discharge Summary (Signed)
Physician Discharge Summary  Dennis Spencer OEU:[id number] DOB: 02/27/1946 DOA: 11/10/2013  PCP: Redge Gainer, MD  Admit date: 11/10/2013 Discharge date: 11/12/2013   Recommendations for Outpatient Follow-Up:   1. F/U with PCP for recurrent symptoms. 2. Stress test slightly abnormal, will be reviewed with cardiologist and formal plan to be defined and communicated with the patient 11/13/13.   Discharge Diagnosis:   Principal Problem:    Atypical chest pain with an abnormal stress test Active Problems:    GERD (gastroesophageal reflux disease)    OSA (obstructive sleep apnea)    Bradycardia    CKD (chronic kidney disease), stage III    Hypertension    Hypercholesteremia    Hyperglycemia    EKG abnormalities    Chest pain   Discharge Condition: Improved.  Diet recommendation: Low sodium, heart healthy.     History of Present Illness:   Dennis Spencer is an 68 y.o. male with a PMH of of hypertension, hyperlipidemia, family history of CAD, previous tracheostomy secondary to sleep apnea who was admitted 11/10/13 with atypical chest pain. Cardiology on board with plans for stress testing.   Hospital Course by Problem:   Principal Problem:  Atypical chest pain with bradycardia and EKG abnormalities  Troponins negative x3 sets.    Stress test (2 day protocol) slightly abnormal. Patient asymptomatic, discharged home, stress test results will be reviewed by cardiologist and the patient will be called 11/13/13 with specific instructions on followup. 2 D Echo negative for regional wall motion abnormalities, EF WNL. Fasting lipid panel showed good lipid control.  Continue aspirin and Lipitor.  Active Problems:  GERD (gastroesophageal reflux disease)  Resolved with IV Protonix and GI cocktails as needed.  OSA (obstructive sleep apnea)  The patient is status post tracheostomy. CKD (chronic kidney disease), stage III  Creatinine currently at usual baseline  values. Hypertension  Controlled on Norvasc. Hypercholesteremia  Continue Lipitor. Good lipid control. Hyperglycemia / morbid obesity Hemoglobin A1c 5.7% corresponding to an increased risk of developing diabetes.  Counseled by dietician.  Procedures:    2 D Echo 11/12/13  2 Day Stress Test 11/11/13-11/12/13   Medical Consultants:    Dr. Candee Furbish, Cardiology.   Discharge Exam:   Filed Vitals:   11/12/13 0535  BP: 122/59  Pulse: 70  Temp: 98.3 F (36.8 C)  Resp: 18   Filed Vitals:   11/11/13 2159 11/11/13 2350 11/12/13 0400 11/12/13 0535  BP: 125/64   122/59  Pulse: 85 88 73 70  Temp: 98 F (36.7 C)   98.3 F (36.8 C)  TempSrc: Oral   Oral  Resp: 18 16 16 18   Height:      Weight:      SpO2: 97% 98% 95% 98%    Gen:  NAD Cardiovascular:  RRR, No M/R/G Respiratory: Lungs CTAB Gastrointestinal: Abdomen soft, NT/ND with normal active bowel sounds. Extremities: No C/E/C    Discharge Instructions:   Discharge Instructions   Call MD for:  extreme fatigue    Complete by:  As directed      Call MD for:  severe uncontrolled pain    Complete by:  As directed      Diet - low sodium heart healthy    Complete by:  As directed      Discharge instructions    Complete by:  As directed   You were cared for by Dr. Jacquelynn Cree  (a hospitalist) during your hospital stay. If you have any questions about  your discharge medications or the care you received while you were in the hospital after you are discharged, you can call the unit and ask to speak with the hospitalist on call if the hospitalist that took care of you is not available. Once you are discharged, your primary care physician will handle any further medical issues. Please note that NO REFILLS for any discharge medications will be authorized once you are discharged, as it is imperative that you return to your primary care physician (or establish a relationship with a primary care physician if you do not have one) for  your aftercare needs so that they can reassess your need for medications and monitor your lab values.  Any outstanding tests can be reviewed by your PCP at your follow up visit.  It is also important to review any medicine changes with your PCP.  Please bring these d/c instructions with you to your next visit so your physician can review these changes with you.     Increase activity slowly    Complete by:  As directed             Medication List         amLODipine 5 MG tablet  Commonly known as:  NORVASC  Take 1 tablet (5 mg total) by mouth every morning.     aspirin EC 81 MG tablet  Take 81 mg by mouth daily.     atorvastatin 20 MG tablet  Commonly known as:  LIPITOR  Take 3 tablets (60 mg total) by mouth every evening.     calcium citrate-vitamin D 315-200 MG-UNIT per tablet  Commonly known as:  CITRACAL+D  Take 2 tablets by mouth 3 (three) times daily.     gabapentin 600 MG tablet  Commonly known as:  NEURONTIN  Take 300 mg by mouth at bedtime.     multivitamin with minerals Tabs tablet  Take 1 tablet by mouth daily.     omeprazole 20 MG tablet  Commonly known as:  PRILOSEC OTC  Take 20 mg by mouth daily.     vitamin B-12 1000 MCG tablet  Commonly known as:  CYANOCOBALAMIN  Take 1,000 mcg by mouth daily.     vitamin C 500 MG tablet  Commonly known as:  ASCORBIC ACID  Take 500 mg by mouth 2 (two) times daily.     Vitamin D3 2000 UNITS capsule  Take 4,000 Units by mouth daily.          The results of significant diagnostics from this hospitalization (including imaging, microbiology, ancillary and laboratory) are listed below for reference.     Significant Diagnostic Studies:   Radiographs: Dg Chest 2 View  11/10/2013   CLINICAL DATA:  Sudden onset chest and back pain  EXAM: CHEST  2 VIEW  COMPARISON:  08/04/2013  FINDINGS: There is a tracheostomy tube which is well seated. Heart size is unchanged and within normal limits. No change in upper mediastinal  contours. No consolidation, edema, effusion, or pneumothorax. Surgical clips noted at the GE junction.  IMPRESSION: No active cardiopulmonary disease.   Electronically Signed   By: Jorje Guild M.D.   On: 11/10/2013 02:35    Labs:  Basic Metabolic Panel:  Recent Labs Lab 11/10/13 0222 11/11/13 0345  NA 135* 136*  K 4.4 4.5  CL 94* 98  CO2 27 28  GLUCOSE 145* 112*  BUN 18 20  CREATININE 1.38* 1.45*  CALCIUM 10.4 9.2   GFR Estimated Creatinine Clearance: 68.3 ml/min (  by C-G formula based on Cr of 1.45). Liver Function Tests:  Recent Labs Lab 11/10/13 0222  AST 25  ALT 19  ALKPHOS 85  BILITOT 0.8  PROT 8.2  ALBUMIN 4.5    Recent Labs Lab 11/10/13 0625  LIPASE 14   CBC:  Recent Labs Lab 11/10/13 0222  WBC 10.3  NEUTROABS 8.2*  HGB 16.3  HCT 45.6  MCV 90.3  PLT 233   Cardiac Enzymes:  Recent Labs Lab 11/10/13 0923 11/11/13 0742 11/11/13 1235 11/11/13 1910  TROPONINI <0.30 <0.30 <0.30 <0.30   Hgb A1c  Recent Labs  11/10/13 0923  HGBA1C 5.7*   Lipid Profile  Recent Labs  11/11/13 0345  CHOL 125  HDL 41  LDLCALC 69  TRIG 76  CHOLHDL 3.0   Thyroid function studies  Recent Labs  11/10/13 0923  TSH 2.410   Microbiology Recent Results (from the past 240 hour(s))  MRSA PCR SCREENING     Status: None   Collection Time    11/10/13 10:44 AM      Result Value Ref Range Status   MRSA by PCR NEGATIVE  NEGATIVE Final   Comment:            The GeneXpert MRSA Assay (FDA     approved for NASAL specimens     only), is one component of a     comprehensive MRSA colonization     surveillance program. It is not     intended to diagnose MRSA     infection nor to guide or     monitor treatment for     MRSA infections.    Time coordinating discharge: 35 minutes.  Signed:  Rico Massar  Pager [phone] [facility] 11/12/2013, 1:40 PM

## 2013-11-12 NOTE — Progress Notes (Signed)
Echo Lab  2D Echocardiogram completed.  [place], [place] 11/12/2013 12:05 PM

## 2013-11-14 NOTE — Progress Notes (Signed)
Reviewed test results w/ Dr. Harl Bowie. He feels study is low risk.  Will contact office to arrange f/u appt to discuss possible cath. He is on ASA and statin, no BB or nitrates.  Contacted pt, he is doing well.  Does not feel he needs rx for NTG, it did not help in hospital Will ck his BP at home and this can be reviewed at office visit.  Agreeable to f/u in office, he will call us for additional symptoms/problems.  Rosaria Ferries, PA-C 11/14/2013 11:14 AM Bee[place] [phone]

## 2013-11-24 ENCOUNTER — Other Ambulatory Visit: Payer: Self-pay | Admitting: Family Medicine

## 2014-04-05 ENCOUNTER — Telehealth: Payer: Self-pay | Admitting: Family Medicine

## 2014-04-05 NOTE — Telephone Encounter (Signed)
Patient aware that we do not have any openings for a welcome to medicare physical. Patient aware this is a minimum of 45 minutes. Patient was told he could call back to check to see if we have any cancellations.

## 2014-05-01 ENCOUNTER — Other Ambulatory Visit: Payer: Self-pay | Admitting: *Deleted

## 2014-05-01 MED ORDER — ATORVASTATIN CALCIUM 20 MG PO TABS: 60.0000 mg | ORAL_TABLET | Freq: Every evening | ORAL | Status: DC

## 2014-05-22 ENCOUNTER — Encounter: Payer: Self-pay | Admitting: Gastroenterology

## 2014-05-23 ENCOUNTER — Other Ambulatory Visit: Payer: Self-pay | Admitting: *Deleted

## 2014-05-23 MED ORDER — AMLODIPINE BESYLATE 5 MG PO TABS: 5.0000 mg | ORAL_TABLET | Freq: Every morning | ORAL | Status: DC

## 2014-05-28 ENCOUNTER — Other Ambulatory Visit: Payer: Self-pay | Admitting: Family Medicine

## 2014-06-15 ENCOUNTER — Encounter: Payer: Self-pay | Admitting: Family Medicine

## 2014-06-15 ENCOUNTER — Ambulatory Visit (INDEPENDENT_AMBULATORY_CARE_PROVIDER_SITE_OTHER): Payer: Commercial Managed Care - HMO | Admitting: Family Medicine

## 2014-06-15 VITALS — BP 152/69 | HR 79 | Temp 97.7°F | Ht 67.5 in | Wt 304.0 lb

## 2014-06-15 DIAGNOSIS — E785 Hyperlipidemia, unspecified: Secondary | ICD-10-CM | POA: Diagnosis not present

## 2014-06-15 DIAGNOSIS — I10 Essential (primary) hypertension: Secondary | ICD-10-CM | POA: Diagnosis not present

## 2014-06-15 DIAGNOSIS — K219 Gastro-esophageal reflux disease without esophagitis: Secondary | ICD-10-CM | POA: Diagnosis not present

## 2014-06-15 MED ORDER — ATORVASTATIN CALCIUM 80 MG PO TABS: 80.0000 mg | ORAL_TABLET | Freq: Every day | ORAL | Status: DC

## 2014-06-15 MED ORDER — GABAPENTIN 600 MG PO TABS: 300.0000 mg | ORAL_TABLET | Freq: Every day | ORAL | Status: DC

## 2014-06-15 MED ORDER — AMLODIPINE BESYLATE 5 MG PO TABS: 5.0000 mg | ORAL_TABLET | Freq: Every morning | ORAL | Status: DC

## 2014-06-15 MED ORDER — OMEPRAZOLE 20 MG PO CPDR: 20.0000 mg | DELAYED_RELEASE_CAPSULE | Freq: Every day | ORAL | Status: DC

## 2014-06-15 NOTE — Progress Notes (Signed)
Subjective:    Patient ID: Dennis Spencer, male    DOB: 17-Feb-1946, 69 y.o.   MRN: [id number]  HPI Patient is here today for follow up of chronic medical problems which include hypertension, hyperlipidemia and GERD.      No Known Allergies  Outpatient Encounter Prescriptions as of 06/15/2014  Medication Sig  . amLODipine (NORVASC) 5 MG tablet Take 1 tablet (5 mg total) by mouth every morning.  Marland Kitchen aspirin EC 81 MG tablet Take 81 mg by mouth daily.  Marland Kitchen atorvastatin (LIPITOR) 20 MG tablet Take 1 tablet (20 mg total) by mouth daily at 6 PM. (Patient taking differently: Take 60 mg by mouth daily at 6 PM. )  . calcium citrate-vitamin D (CITRACAL+D) 315-200 MG-UNIT per tablet Take 2 tablets by mouth 3 (three) times daily.   . Cholecalciferol (VITAMIN D3) 2000 UNITS capsule Take 4,000 Units by mouth daily.  Marland Kitchen gabapentin (NEURONTIN) 600 MG tablet TAKE 1/2 TABLET DAILY  . Multiple Vitamin (MULTIVITAMIN WITH MINERALS) TABS tablet Take 1 tablet by mouth daily.  Marland Kitchen omeprazole (PRILOSEC OTC) 20 MG tablet Take 20 mg by mouth daily.  . vitamin B-12 (CYANOCOBALAMIN) 1000 MCG tablet Take 1,000 mcg by mouth daily.  . vitamin C (ASCORBIC ACID) 500 MG tablet Take 500 mg by mouth 2 (two) times daily.  . [DISCONTINUED] gabapentin (NEURONTIN) 600 MG tablet Take 300 mg by mouth at bedtime.    Past Medical History  Diagnosis Date  . Kidney stones   . Hypertension   . Hypercholesteremia   . Sleep apnea     s/p tracheostomy  . Osteomyelitis     LLE age 59  . 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  Marland Kitchen GERD (gastroesophageal reflux disease)   . Bursitis of left shoulder     Takes neurontin for this.  . CKD (chronic kidney disease), stage III 11/10/2013  . Severe obesity (BMI >= 40) 11/12/2013    Past Surgical History  Procedure Laterality Date  . Lithotripsy    .  Lithotripsy    . Percutaneous nephrostolithotomy    . Cystoscopy with retrograde pyelogram, ureteroscopy and stent placement Bilateral 06/18/2013    Procedure: CYSTOSCOPY WITH RETROGRADE PYELOGRAM, Left URETEROSCOPY with laser , AND bilateral STENT PLACEMENT;  Surgeon: Alexis Frock, MD;  Location: [facility];  Service: Urology;  Laterality: Bilateral;  . Holmium laser application Bilateral 11/17/2954    Procedure: HOLMIUM LASER APPLICATION;  Surgeon: Alexis Frock, MD;  Location: [facility];  Service: Urology;  Laterality: Bilateral;  . Roux en y      [place] (weight lost 155 lbs.) Stable -has gained 20 lbs over desired 250#  desired level  . Tracheostomy      '87- s/p tongue and nasal septum deviation and Sleep apnea surgery  . Tonsillectomy      child  . Nephrolithotomy Left 08/08/2013    Procedure: 1ST STAGE NEPHROLITHOTOMY PERCUTANEOUS WITH SURGEON ACCESS;  Surgeon: Alexis Frock, MD;  Location: [facility];  Service: Urology;  Laterality: Left;  . Cystoscopy with retrograde pyelogram, ureteroscopy and stent placement Right 08/08/2013    Procedure: CYSTOSCOPY WITH RIGHT RETROGRADE PYELOGRAM, URETEROSCOPY AND STENT PLACEMENT, stone extraction;  Surgeon: Alexis Frock, MD;  Location: [facility];  Service: Urology;  Laterality: Right;  . Nephrolithotomy Left 08/10/2013    Procedure: NEPHROLITHOTOMY PERCUTANEOUS SECOND LOOK/LEFT DIGITAL URETEROSCOPY/BASKETING OF  STONE/EXCHANGE OF LEFT URETERAL STENT;  Surgeon: Alexis Frock, MD;  Location: [facility];  Service: Urology;  Laterality: Left;  . Cystoscopy with retrograde pyelogram, ureteroscopy and stent placement Right 08/10/2013    Procedure: 2ND STAGE CYSTOSCOPY WITH RETROGRADE PYELOGRAM, URETEROSCOPY AND STENT EXCHANGE;  Surgeon: Alexis Frock, MD;  Location: [facility];  Service: Urology;  Laterality: Right;  . Holmium laser application Left 10/12/1306    Procedure: HOLMIUM LASER APPLICATION;  Surgeon: Alexis Frock, MD;  Location: [facility];  Service: Urology;   Laterality: Left;  . Gastric bypass  2004    History   Social History  . Marital Status: Married    Spouse Name: Helene Kelp    Number of Children: 3  . Years of Education: N/A   Occupational History  . Retired, English as a second language teacher.    Social History Main Topics  . Smoking status: Former Smoker    Types: Cigarettes    Quit date: 05/12/1968  . Smokeless tobacco: Never Used  . Alcohol Use: No  . Drug Use: No  . Sexual Activity: Yes   Other Topics Concern  . Not on file   Social History Narrative   Married.       Review of Systems     Objective:   Physical Exam BP 152/69 mmHg  Pulse 79  Temp(Src) 97.7 F (36.5 C) (Oral)  Ht 5' 7.5" (1.715 m)  Wt 304 lb (137.893 kg)  BMI 46.88 kg/m2        Assessment & Plan:

## 2014-06-15 NOTE — Progress Notes (Signed)
Subjective:    Patient ID: Dennis Spencer, male    DOB: 1945/07/21, 69 y.o.   MRN: [id number]  HPI Patient recently lost his mother to cervical cancer 1 year ago. Also at that time his father passed from complications of heart disease. He had an MI and had a history of brittle diabetes mellitus. Patient in for follow-up of hypertension. Patient has no history of headache chest pain or shortness of breath or recent cough. Patient also denies symptoms of TIA such as numbness weakness lateralizing. Patient checks  blood pressure at home and has not had any elevated readings recently. Usually runs 829H systolic.  Patient denies side effects from his medication. States taking it regularly. Patient in for follow-up of GERD. He is currently asymptomatic taking his PPI daily. There is no chest pain or heartburn. He is taking the medication regularly. No dysphagia or choking. Patient in for follow-up of elevated cholesterol. Doing well without complaints on current medication. Denies side effects of statin including myalgia and arthralgia and nausea. Also in today for liver function testing. Currently no chest pain, shortness of breath or other cardiovascular related symptoms noted.  Review of Systems  Constitutional: Negative for fever, chills, diaphoresis and unexpected weight change.  HENT: Negative for congestion, hearing loss, rhinorrhea, sore throat and trouble swallowing.   Respiratory: Negative for cough, chest tightness, shortness of breath and wheezing.   Gastrointestinal: Negative for nausea, vomiting, abdominal pain, diarrhea, constipation and abdominal distention.  Endocrine: Negative for cold intolerance and heat intolerance.  Genitourinary: Negative for dysuria, hematuria and flank pain.  Musculoskeletal: Negative for joint swelling and arthralgias.  Skin: Negative for rash.  Neurological: Negative for dizziness and headaches.  Psychiatric/Behavioral: Negative for dysphoric mood,  decreased concentration and agitation. The patient is not nervous/anxious.        Objective:   Physical Exam  Constitutional: He is oriented to person, place, and time. He appears well-developed and well-nourished. No distress.  HENT:  Head: Normocephalic and atraumatic.  Right Ear: External ear normal.  Left Ear: External ear normal.  Nose: Nose normal.  Mouth/Throat: Oropharynx is clear and moist.  Eyes: Conjunctivae and EOM are normal. Pupils are equal, round, and reactive to light.  Neck: Normal range of motion. Neck supple. No thyromegaly present.  Cardiovascular: Normal rate, regular rhythm and normal heart sounds.   No murmur heard. Pulmonary/Chest: Effort normal and breath sounds normal. No respiratory distress. He has no wheezes. He has no rales.  Abdominal: Soft. Bowel sounds are normal. He exhibits no distension. There is no tenderness.  Lymphadenopathy:    He has no cervical adenopathy.  Neurological: He is alert and oriented to person, place, and time. He has normal reflexes.  Skin: Skin is warm and dry.  Psychiatric: He has a normal mood and affect. His behavior is normal. Judgment and thought content normal.   BP 152/69 mmHg  Pulse 79  Temp(Src) 97.7 F (36.5 C) (Oral)  Ht 5' 7.5" (1.715 m)  Wt 304 lb (137.893 kg)  BMI 46.88 kg/m2        Assessment & Plan:   1. HLD (hyperlipidemia)   2. Essential hypertension   3. Gastroesophageal reflux disease without esophagitis     Meds ordered this encounter  Medications  . atorvastatin (LIPITOR) 80 MG tablet    Sig: Take 1 tablet (80 mg total) by mouth daily at 6 PM.    Dispense:  90 tablet    Refill:  3    Needs  to be seen for additional refills  . amLODipine (NORVASC) 5 MG tablet    Sig: Take 1 tablet (5 mg total) by mouth every morning.    Dispense:  90 tablet    Refill:  3    Needs to be seen prior to any further refills.  . gabapentin (NEURONTIN) 600 MG tablet    Sig: Take 0.5 tablets (300 mg total)  by mouth daily.    Dispense:  30 tablet    Refill:  4  . omeprazole (PRILOSEC) 20 MG capsule    Sig: Take 1 capsule (20 mg total) by mouth daily.    Dispense:  90 capsule    Refill:  3    Orders Placed This Encounter  Procedures  . CBC with Differential  . NMR, lipoprofile  . CMP14+EGFR    Labs pending Health Maintenance reviewed Diet and exercise encouraged Continue all meds as discussed Follow up in 6 mos.  Claretta Fraise, MD

## 2014-06-16 LAB — NMR, LIPOPROFILE
Cholesterol: 148 mg/dL (ref 100–199)
HDL CHOLESTEROL BY NMR: 48 mg/dL (ref >39)
HDL Particle Number: 37.2 umol/L (ref >=30.5)
LDL Particle Number: 1082 nmol/L — ABNORMAL HIGH (ref <1000)
LDL Size: 20.2 nm (ref >20.5)
LDL-C: 73 mg/dL (ref 0–99)
LP-IR Score: 69 — ABNORMAL HIGH (ref <=45)
SMALL LDL PARTICLE NUMBER: 579 nmol/L — AB (ref <=527)
Triglycerides by NMR: 133 mg/dL (ref 0–149)

## 2014-06-16 LAB — CMP14+EGFR
ALT: 16 IU/L (ref 0–44)
AST: 22 IU/L (ref 0–40)
Albumin/Globulin Ratio: 1.6 (ref 1.1–2.5)
Albumin: 4.2 g/dL (ref 3.6–4.8)
Alkaline Phosphatase: 72 IU/L (ref 39–117)
BUN/Creatinine Ratio: 14 (ref 10–22)
BUN: 18 mg/dL (ref 8–27)
CO2: 27 mmol/L (ref 18–29)
Calcium: 10.2 mg/dL (ref 8.6–10.2)
Chloride: 101 mmol/L (ref 97–108)
Creatinine, Ser: 1.25 mg/dL (ref 0.76–1.27)
GFR calc Af Amer: 68 mL/min/{1.73_m2} (ref >59)
GFR calc non Af Amer: 59 mL/min/{1.73_m2} — ABNORMAL LOW (ref >59)
Globulin, Total: 2.7 g/dL (ref 1.5–4.5)
Glucose: 104 mg/dL — ABNORMAL HIGH (ref 65–99)
Potassium: 5.2 mmol/L (ref 3.5–5.2)
Sodium: 141 mmol/L (ref 134–144)
Total Bilirubin: 0.6 mg/dL (ref 0.0–1.2)
Total Protein: 6.9 g/dL (ref 6.0–8.5)

## 2014-06-16 LAB — CBC WITH DIFFERENTIAL/PLATELET
Basophils Absolute: 0 10*3/uL (ref 0.0–0.2)
Basos: 1 %
Eos: 3 %
Eosinophils Absolute: 0.2 10*3/uL (ref 0.0–0.4)
HCT: 45.1 % (ref 37.5–51.0)
Hemoglobin: 15.5 g/dL (ref 12.6–17.7)
Immature Grans (Abs): 0 10*3/uL (ref 0.0–0.1)
Immature Granulocytes: 0 %
Lymphocytes Absolute: 2 10*3/uL (ref 0.7–3.1)
Lymphs: 33 %
MCH: 31.3 pg (ref 26.6–33.0)
MCHC: 34.4 g/dL (ref 31.5–35.7)
MCV: 91 fL (ref 79–97)
Monocytes Absolute: 0.6 10*3/uL (ref 0.1–0.9)
Monocytes: 10 %
Neutrophils Absolute: 3.2 10*3/uL (ref 1.4–7.0)
Neutrophils Relative %: 53 %
Platelets: 220 10*3/uL (ref 150–379)
RBC: 4.95 x10E6/uL (ref 4.14–5.80)
RDW: 13.8 % (ref 12.3–15.4)
WBC: 6 10*3/uL (ref 3.4–10.8)

## 2014-11-06 ENCOUNTER — Other Ambulatory Visit: Payer: Self-pay

## 2014-11-06 DIAGNOSIS — H40053 Ocular hypertension, bilateral: Secondary | ICD-10-CM | POA: Diagnosis not present

## 2014-11-06 DIAGNOSIS — H524 Presbyopia: Secondary | ICD-10-CM | POA: Diagnosis not present

## 2014-11-06 DIAGNOSIS — H5210 Myopia, unspecified eye: Secondary | ICD-10-CM | POA: Diagnosis not present

## 2014-11-06 MED ORDER — GABAPENTIN 600 MG PO TABS: 300.0000 mg | ORAL_TABLET | Freq: Every day | ORAL | Status: DC

## 2014-11-06 NOTE — Telephone Encounter (Signed)
Last seen 06/15/14  Dr Livia Snellen

## 2014-11-30 DIAGNOSIS — N5201 Erectile dysfunction due to arterial insufficiency: Secondary | ICD-10-CM | POA: Diagnosis not present

## 2014-11-30 DIAGNOSIS — N200 Calculus of kidney: Secondary | ICD-10-CM | POA: Diagnosis not present

## 2014-11-30 DIAGNOSIS — E748 Other specified disorders of carbohydrate metabolism: Secondary | ICD-10-CM | POA: Diagnosis not present

## 2014-11-30 DIAGNOSIS — N289 Disorder of kidney and ureter, unspecified: Secondary | ICD-10-CM | POA: Diagnosis not present

## 2014-12-15 ENCOUNTER — Encounter (INDEPENDENT_AMBULATORY_CARE_PROVIDER_SITE_OTHER): Payer: Self-pay

## 2014-12-15 ENCOUNTER — Ambulatory Visit (INDEPENDENT_AMBULATORY_CARE_PROVIDER_SITE_OTHER): Payer: Commercial Managed Care - HMO | Admitting: Family Medicine

## 2014-12-15 VITALS — BP 142/76 | HR 81 | Temp 97.6°F | Ht 68.0 in | Wt 293.0 lb

## 2014-12-15 DIAGNOSIS — I10 Essential (primary) hypertension: Secondary | ICD-10-CM

## 2014-12-15 DIAGNOSIS — N183 Chronic kidney disease, stage 3 unspecified: Secondary | ICD-10-CM

## 2014-12-15 DIAGNOSIS — K219 Gastro-esophageal reflux disease without esophagitis: Secondary | ICD-10-CM

## 2014-12-15 DIAGNOSIS — E559 Vitamin D deficiency, unspecified: Secondary | ICD-10-CM

## 2014-12-15 DIAGNOSIS — E7800 Pure hypercholesterolemia, unspecified: Secondary | ICD-10-CM

## 2014-12-15 MED ORDER — LISINOPRIL 10 MG PO TABS: 10.0000 mg | ORAL_TABLET | Freq: Every day | ORAL | Status: DC

## 2014-12-15 MED ORDER — GABAPENTIN 600 MG PO TABS: 300.0000 mg | ORAL_TABLET | Freq: Every day | ORAL | Status: DC

## 2014-12-15 NOTE — Patient Instructions (Signed)
Consider discontinuing gabapentin.  Remember to take omeprazole on an empty stomach.

## 2014-12-15 NOTE — Progress Notes (Signed)
Subjective:  Patient ID: Dennis Spencer, male    DOB: [birth year]  Age: 69 y.o. MRN: [id number]  CC: Hypertension; Hyperlipidemia; and Chronic Kidney Disease   HPI Dennis Spencer presents for  follow-up of hypertension. Patient has no history of headache chest pain or shortness of breath or recent cough. Patient also denies symptoms of TIA such as numbness weakness lateralizing. Patient checks  blood pressure at home and has not had any elevated readings recently. Patient denies side effects from his medication. States taking it regularly.  Patient also  in for follow-up of elevated cholesterol. Doing well without complaints on current medication. Denies side effects of statin including myalgia and arthralgia and nausea. Also in today for liver function testing. Currently no chest pain, shortness of breath or other cardiovascular related symptoms noted.    History Dennis Spencer has a past medical history of Kidney stones; Hypertension; Hypercholesteremia; Sleep apnea; Osteomyelitis; Tracheostomy dependence; History of kidney stones (08-04-13); GERD (gastroesophageal reflux disease); Bursitis of left shoulder; CKD (chronic kidney disease), stage III (11/10/2013); and Severe obesity (BMI >= 40) (11/12/2013).   He has past surgical history that includes Lithotripsy; Lithotripsy; Percutaneous nephrostolithotomy; Cystoscopy with retrograde pyelogram, ureteroscopy and stent placement (Bilateral, 06/18/2013); Holmium laser application (Bilateral, 06/18/2013); roux en y; Tracheostomy; Tonsillectomy; Nephrolithotomy (Left, 08/08/2013); Cystoscopy with retrograde pyelogram, ureteroscopy and stent placement (Right, 08/08/2013); Nephrolithotomy (Left, 08/10/2013); Cystoscopy with retrograde pyelogram, ureteroscopy and stent placement (Right, 08/10/2013); Holmium laser application (Left, 07/14/3612); and Gastric bypass (2004).   His family history includes Cancer in his mother; Diabetes in his father; Heart failure in his father;  Hypertension in his father.He reports that he quit smoking about 46 years ago. His smoking use included Cigarettes. He has never used smokeless tobacco. He reports that he does not drink alcohol or use illicit drugs.  Current Outpatient Prescriptions on File Prior to Visit  Medication Sig Dispense Refill  . amLODipine (NORVASC) 5 MG tablet Take 1 tablet (5 mg total) by mouth every morning. 90 tablet 3  . aspirin EC 81 MG tablet Take 81 mg by mouth daily.    Marland Kitchen atorvastatin (LIPITOR) 80 MG tablet Take 1 tablet (80 mg total) by mouth daily at 6 PM. 90 tablet 3  . calcium citrate-vitamin D (CITRACAL+D) 315-200 MG-UNIT per tablet Take 2 tablets by mouth 3 (three) times daily.     . Cholecalciferol (VITAMIN D3) 2000 UNITS capsule Take 4,000 Units by mouth daily.    . Multiple Vitamin (MULTIVITAMIN WITH MINERALS) TABS tablet Take 1 tablet by mouth daily.    Marland Kitchen omeprazole (PRILOSEC) 20 MG capsule Take 1 capsule (20 mg total) by mouth daily. 90 capsule 3  . vitamin B-12 (CYANOCOBALAMIN) 1000 MCG tablet Take 1,000 mcg by mouth daily.    . vitamin C (ASCORBIC ACID) 500 MG tablet Take 500 mg by mouth 2 (two) times daily.     No current facility-administered medications on file prior to visit.    ROS Review of Systems  Constitutional: Negative for fever, chills and diaphoresis.  HENT: Negative for congestion, rhinorrhea and sore throat.   Respiratory: Negative for cough, shortness of breath and wheezing.   Cardiovascular: Negative for chest pain.  Gastrointestinal: Negative for nausea, vomiting, abdominal pain, diarrhea, constipation and abdominal distention.  Genitourinary: Negative for dysuria and frequency.  Musculoskeletal: Negative for joint swelling and arthralgias.  Skin: Negative for rash.  Neurological: Negative for headaches.    Objective:  BP 142/76 mmHg  Pulse 81  Temp(Src) 97.6 F (36.4 C) (  Oral)  Ht 5' 8"  (1.727 m)  Wt 293 lb (132.904 kg)  BMI 44.56 kg/m2  BP Readings from Last  3 Encounters:  12/15/14 142/76  06/15/14 152/69  11/12/13 122/59    Wt Readings from Last 3 Encounters:  12/15/14 293 lb (132.904 kg)  06/15/14 304 lb (137.893 kg)  11/11/13 289 lb 11 oz (131.4 kg)     Physical Exam  Constitutional: He is oriented to person, place, and time. He appears well-developed and well-nourished. No distress.  HENT:  Head: Normocephalic and atraumatic.  Right Ear: External ear normal.  Left Ear: External ear normal.  Nose: Nose normal.  Mouth/Throat: Oropharynx is clear and moist.  Eyes: Conjunctivae and EOM are normal. Pupils are equal, round, and reactive to light.  Neck: Normal range of motion. Neck supple. No thyromegaly present.  Cardiovascular: Normal rate, regular rhythm and normal heart sounds.   No murmur heard. Pulmonary/Chest: Effort normal and breath sounds normal. No respiratory distress. He has no wheezes. He has no rales.  Abdominal: Soft. Bowel sounds are normal. He exhibits no distension. There is no tenderness.  Lymphadenopathy:    He has no cervical adenopathy.  Neurological: He is alert and oriented to person, place, and time. He has normal reflexes.  Skin: Skin is warm and dry.  Psychiatric: He has a normal mood and affect. His behavior is normal. Judgment and thought content normal.    Lab Results  Component Value Date   HGBA1C 5.7* 11/10/2013   HGBA1C 5.0% 09/12/2013   HGBA1C 5.9* 06/19/2013    Lab Results  Component Value Date   WBC 6.0 06/15/2014   HGB 15.5 06/15/2014   HCT 45.1 06/15/2014   PLT 220 06/15/2014   GLUCOSE 104* 06/15/2014   CHOL 148 06/15/2014   TRIG 133 06/15/2014   HDL 48 06/15/2014   LDLDIRECT 82 11/09/2012   LDLCALC 69 11/11/2013   ALT 16 06/15/2014   AST 22 06/15/2014   NA 141 06/15/2014   K 5.2 06/15/2014   CL 101 06/15/2014   CREATININE 1.25 06/15/2014   BUN 18 06/15/2014   CO2 27 06/15/2014   TSH 2.410 11/10/2013   HGBA1C 5.7* 11/10/2013    Nm Myocar Multi W/spect W/wall Motion /  Ef  11/12/2013   CLINICAL DATA:  Chest pain.  EXAM: MYOCARDIAL IMAGING WITH SPECT (REST AND EXERCISE - 2 DAY PROTOCOL)  GATED LEFT VENTRICULAR WALL MOTION STUDY  LEFT VENTRICULAR EJECTION FRACTION  TECHNIQUE: Standard myocardial SPECT imaging was performed after resting intravenous injection of 30 mCi Tc-21msestamibi. On a different day, exercise tolerance test was performed by the patient under the supervision of the Cardiology staff. At peak-stress, 30 mCi Tc-96mestamibi was injected intravenously and standard myocardial SPECT imaging was performed. Quantitative gated imaging was also performed to evaluate left ventricular wall motion, and estimate left ventricular ejection fraction.  COMPARISON:  None.  FINDINGS: The SPECT stress and rest images demonstrate slight decreased activity in the lateral wall on both sets of images. It appears slightly more pronounced on stress images and could not exclude mild peri-infarct ischemia. The gated SPECT images demonstrate lateral wall motion abnormality. The estimated ejection fraction was 76%.  IMPRESSION: Laterall wall scar with probable mild peri-infarct ischemia.  Ejection fraction calculated at 76%.   Electronically Signed   By: MaKalman Jewels.D.   On: 11/12/2013 14:26    Assessment & Plan:   [place] seen today for hypertension, hyperlipidemia and chronic kidney disease.  Diagnoses and all  orders for this visit:  Gastroesophageal reflux disease without esophagitis Orders: -     POCT CBC; Standing  CKD (chronic kidney disease), stage III Orders: -     CMP14+EGFR; Standing  Essential hypertension Orders: -     CMP14+EGFR; Standing  Hypercholesteremia Orders: -     CMP14+EGFR; Standing -     Lipid panel; Standing -     Hepatic function panel; Standing  Vitamin D deficiency Orders: -     Vit D  25 hydroxy (rtn osteoporosis monitoring); Standing  Other orders -     gabapentin (NEURONTIN) 600 MG tablet; Take 0.5 tablets (300 mg  total) by mouth daily. -     lisinopril (PRINIVIL,ZESTRIL) 10 MG tablet; Take 1 tablet (10 mg total) by mouth daily.   I am having Mr. Bostick start on lisinopril. I am also having him maintain his vitamin C, vitamin B-12, aspirin EC, Vitamin D3, multivitamin with minerals, calcium citrate-vitamin D, atorvastatin, amLODipine, omeprazole, and gabapentin.  Meds ordered this encounter  Medications  . gabapentin (NEURONTIN) 600 MG tablet    Sig: Take 0.5 tablets (300 mg total) by mouth daily.    Dispense:  45 tablet    Refill:  2  . lisinopril (PRINIVIL,ZESTRIL) 10 MG tablet    Sig: Take 1 tablet (10 mg total) by mouth daily.    Dispense:  90 tablet    Refill:  3     Follow-up: Return in about 6 months (around 06/17/2015) for CPE.  Claretta Fraise, M.D.

## 2014-12-25 ENCOUNTER — Other Ambulatory Visit (INDEPENDENT_AMBULATORY_CARE_PROVIDER_SITE_OTHER): Payer: Commercial Managed Care - HMO

## 2014-12-25 DIAGNOSIS — K219 Gastro-esophageal reflux disease without esophagitis: Secondary | ICD-10-CM

## 2014-12-25 DIAGNOSIS — I10 Essential (primary) hypertension: Secondary | ICD-10-CM | POA: Diagnosis not present

## 2014-12-25 DIAGNOSIS — E7800 Pure hypercholesterolemia, unspecified: Secondary | ICD-10-CM

## 2014-12-25 DIAGNOSIS — N183 Chronic kidney disease, stage 3 unspecified: Secondary | ICD-10-CM

## 2014-12-25 DIAGNOSIS — E559 Vitamin D deficiency, unspecified: Secondary | ICD-10-CM | POA: Diagnosis not present

## 2014-12-25 NOTE — Progress Notes (Signed)
Lab only 

## 2014-12-26 LAB — CMP14+EGFR
ALBUMIN: 3.8 g/dL (ref 3.6–4.8)
ALT: 23 IU/L (ref 0–44)
AST: 21 IU/L (ref 0–40)
Albumin/Globulin Ratio: 1.6 (ref 1.1–2.5)
Alkaline Phosphatase: 64 IU/L (ref 39–117)
BUN/Creatinine Ratio: 17 (ref 10–22)
BUN: 21 mg/dL (ref 8–27)
Bilirubin Total: 0.8 mg/dL (ref 0.0–1.2)
CALCIUM: 9.5 mg/dL (ref 8.6–10.2)
CO2: 23 mmol/L (ref 18–29)
CREATININE: 1.21 mg/dL (ref 0.76–1.27)
Chloride: 99 mmol/L (ref 97–108)
GFR calc non Af Amer: 61 mL/min/{1.73_m2} (ref >59)
GFR, EST AFRICAN AMERICAN: 71 mL/min/{1.73_m2} (ref >59)
GLUCOSE: 99 mg/dL (ref 65–99)
Globulin, Total: 2.4 g/dL (ref 1.5–4.5)
Potassium: 5.1 mmol/L (ref 3.5–5.2)
Sodium: 137 mmol/L (ref 134–144)
TOTAL PROTEIN: 6.2 g/dL (ref 6.0–8.5)

## 2014-12-26 LAB — LIPID PANEL
Chol/HDL Ratio: 3.6 ratio units (ref 0.0–5.0)
Cholesterol, Total: 143 mg/dL (ref 100–199)
HDL: 40 mg/dL (ref >39)
LDL CALC: 79 mg/dL (ref 0–99)
Triglycerides: 122 mg/dL (ref 0–149)
VLDL CHOLESTEROL CAL: 24 mg/dL (ref 5–40)

## 2014-12-26 LAB — CBC WITH DIFFERENTIAL/PLATELET
BASOS ABS: 0 10*3/uL (ref 0.0–0.2)
BASOS: 1 %
EOS (ABSOLUTE): 0.2 10*3/uL (ref 0.0–0.4)
Eos: 3 %
Hematocrit: 46 % (ref 37.5–51.0)
Hemoglobin: 15.5 g/dL (ref 12.6–17.7)
IMMATURE GRANS (ABS): 0 10*3/uL (ref 0.0–0.1)
Immature Granulocytes: 0 %
LYMPHS ABS: 2.2 10*3/uL (ref 0.7–3.1)
Lymphs: 39 %
MCH: 31.4 pg (ref 26.6–33.0)
MCHC: 33.7 g/dL (ref 31.5–35.7)
MCV: 93 fL (ref 79–97)
MONOCYTES: 9 %
Monocytes Absolute: 0.5 10*3/uL (ref 0.1–0.9)
NEUTROS ABS: 2.7 10*3/uL (ref 1.4–7.0)
Neutrophils: 48 %
Platelets: 209 10*3/uL (ref 150–379)
RBC: 4.93 x10E6/uL (ref 4.14–5.80)
RDW: 14.3 % (ref 12.3–15.4)
WBC: 5.6 10*3/uL (ref 3.4–10.8)

## 2014-12-26 LAB — HEPATIC FUNCTION PANEL: Bilirubin, Direct: 0.2 mg/dL (ref 0.00–0.40)

## 2014-12-26 LAB — VITAMIN D 25 HYDROXY (VIT D DEFICIENCY, FRACTURES): VIT D 25 HYDROXY: 52.2 ng/mL (ref 30.0–100.0)

## 2014-12-26 LAB — PLEASE NOTE

## 2015-03-02 ENCOUNTER — Ambulatory Visit (INDEPENDENT_AMBULATORY_CARE_PROVIDER_SITE_OTHER): Payer: Commercial Managed Care - HMO | Admitting: *Deleted

## 2015-03-02 VITALS — BP 139/74 | HR 66

## 2015-03-02 DIAGNOSIS — I10 Essential (primary) hypertension: Secondary | ICD-10-CM

## 2015-03-02 NOTE — Progress Notes (Signed)
Patient here today for a bp rck. Patients BP 139/74 mmHg  Pulse 66

## 2015-03-05 ENCOUNTER — Encounter: Payer: Self-pay | Admitting: Family Medicine

## 2015-03-05 ENCOUNTER — Ambulatory Visit (INDEPENDENT_AMBULATORY_CARE_PROVIDER_SITE_OTHER): Payer: Commercial Managed Care - HMO | Admitting: Family Medicine

## 2015-03-05 VITALS — BP 153/65 | HR 65 | Temp 98.8°F | Ht 68.0 in | Wt 290.6 lb

## 2015-03-05 DIAGNOSIS — R1011 Right upper quadrant pain: Secondary | ICD-10-CM

## 2015-03-05 DIAGNOSIS — I10 Essential (primary) hypertension: Secondary | ICD-10-CM | POA: Diagnosis not present

## 2015-03-05 DIAGNOSIS — K219 Gastro-esophageal reflux disease without esophagitis: Secondary | ICD-10-CM | POA: Diagnosis not present

## 2015-03-05 DIAGNOSIS — N183 Chronic kidney disease, stage 3 unspecified: Secondary | ICD-10-CM

## 2015-03-05 NOTE — Progress Notes (Signed)
Subjective:  Patient ID: Dennis Spencer, male    DOB: 1945/12/13  Age: 69 y.o. MRN: [id number]  CC: Abdominal Pain   HPI Dennis Spencer presents for abdominal pain that starts indigestion and radiates to the right and around to the back. This occurred on 3 or 4 occasions within the last six months. Most recently 1 week ago lasted longer - 48 hours, and it was more intense - 7-8/10. He has a few oxycodone he keeps for kidney stones. One of those eased it off so he could rest. Has increased belching. Radiates to the right shoulder blade. No relation to type of food or eating. This is different from his kidney pain from stones. No edema from renal failure.    follow-up of hypertension. Patient has no history of headache chest pain or shortness of breath or recent cough. Patient also denies symptoms of TIA such as numbness weakness lateralizing. Patient checks  blood pressure at home and has not had any elevated readings recently. Patient denies side effects from his medication. States taking it regularly.   Medications as noted below. Taking them regularly without complication/adverse reaction being reported today.    History Dennis Spencer has a past medical history of Kidney stones; Hypertension; Hypercholesteremia; Sleep apnea; Osteomyelitis ([place]); Tracheostomy dependence ([place]); History of kidney stones (08-04-13); GERD (gastroesophageal reflux disease); Bursitis of left shoulder; CKD (chronic kidney disease), stage III (11/10/2013); and Severe obesity (BMI >= 40) ([place]) (11/12/2013).   He has past surgical history that includes Lithotripsy; Lithotripsy; Percutaneous nephrostolithotomy; Cystoscopy with retrograde pyelogram, ureteroscopy and stent placement (Bilateral, 06/18/2013); Holmium laser application (Bilateral, 06/18/2013); roux en y; Tracheostomy; Tonsillectomy; Nephrolithotomy (Left, 08/08/2013); Cystoscopy with retrograde pyelogram, ureteroscopy and stent placement (Right, 08/08/2013); Nephrolithotomy  (Left, 08/10/2013); Cystoscopy with retrograde pyelogram, ureteroscopy and stent placement (Right, 08/10/2013); Holmium laser application (Left, 11/15/5463); and Gastric bypass (2004).   His family history includes Cancer in his mother; Diabetes in his father; Heart failure in his father; Hypertension in his father.He reports that he quit smoking about 46 years ago. His smoking use included Cigarettes. He has never used smokeless tobacco. He reports that he does not drink alcohol or use illicit drugs.  Current Outpatient Prescriptions on File Prior to Visit  Medication Sig Dispense Refill  . amLODipine (NORVASC) 5 MG tablet Take 1 tablet (5 mg total) by mouth every morning. 90 tablet 3  . aspirin EC 81 MG tablet Take 81 mg by mouth daily.    Marland Kitchen atorvastatin (LIPITOR) 80 MG tablet Take 1 tablet (80 mg total) by mouth daily at 6 PM. 90 tablet 3  . calcium citrate-vitamin D (CITRACAL+D) 315-200 MG-UNIT per tablet Take 2 tablets by mouth 3 (three) times daily.     . Cholecalciferol (VITAMIN D3) 2000 UNITS capsule Take 4,000 Units by mouth daily.    Marland Kitchen gabapentin (NEURONTIN) 600 MG tablet Take 0.5 tablets (300 mg total) by mouth daily. 45 tablet 2  . Multiple Vitamin (MULTIVITAMIN WITH MINERALS) TABS tablet Take 1 tablet by mouth daily.    Marland Kitchen omeprazole (PRILOSEC) 20 MG capsule Take 1 capsule (20 mg total) by mouth daily. 90 capsule 3  . vitamin B-12 (CYANOCOBALAMIN) 1000 MCG tablet Take 1,000 mcg by mouth daily.    . vitamin C (ASCORBIC ACID) 500 MG tablet Take 500 mg by mouth 2 (two) times daily.    Marland Kitchen lisinopril (PRINIVIL,ZESTRIL) 10 MG tablet Take 1 tablet (10 mg total) by mouth daily. (Patient not taking: Reported on 03/05/2015) 90 tablet 3  No current facility-administered medications on file prior to visit.    ROS Review of Systems  Constitutional: Negative for fever, chills and diaphoresis.  HENT: Negative for congestion, rhinorrhea and sore throat.   Respiratory: Negative for cough, shortness of  breath and wheezing.   Cardiovascular: Negative for chest pain.  Gastrointestinal: Positive for abdominal pain and abdominal distention. Negative for nausea, vomiting, diarrhea, constipation and blood in stool.  Genitourinary: Positive for flank pain. Negative for dysuria, frequency and hematuria.  Musculoskeletal: Negative for joint swelling and arthralgias.  Skin: Negative for rash.  Neurological: Negative for dizziness, weakness and headaches.  Psychiatric/Behavioral: The patient is not nervous/anxious.     Objective:  BP 153/65 mmHg  Pulse 65  Temp(Src) 98.8 F (37.1 C) (Oral)  Ht 5\' 8"  (1.727 m)  Wt 290 lb 9.6 oz (131.815 kg)  BMI 44.20 kg/m2  BP Readings from Last 3 Encounters:  03/05/15 153/65  03/02/15 139/74  12/15/14 142/76    Wt Readings from Last 3 Encounters:  03/05/15 290 lb 9.6 oz (131.815 kg)  12/15/14 293 lb (132.904 kg)  06/15/14 304 lb (137.893 kg)     Physical Exam  Constitutional: He appears well-developed and well-nourished.  HENT:  Head: Normocephalic and atraumatic.  Right Ear: Tympanic membrane and external ear normal. No decreased hearing is noted.  Left Ear: Tympanic membrane and external ear normal. No decreased hearing is noted.  Mouth/Throat: No oropharyngeal exudate or posterior oropharyngeal erythema.  Eyes: Pupils are equal, round, and reactive to light.  Neck: Normal range of motion. Neck supple.  Cardiovascular: Normal rate and regular rhythm.   No murmur heard. Pulmonary/Chest: Breath sounds normal. No respiratory distress.  Abdominal: Soft. Bowel sounds are normal. He exhibits no mass. There is tenderness (mild, RUQ).  Vitals reviewed.   Lab Results  Component Value Date   HGBA1C 5.7* 11/10/2013   HGBA1C 5.0% 09/12/2013   HGBA1C 5.9* 06/19/2013    Lab Results  Component Value Date   WBC 5.6 12/25/2014   HGB 15.5 06/15/2014   HCT 46.0 12/25/2014   PLT 220 06/15/2014   GLUCOSE 99 12/25/2014   CHOL 143 12/25/2014   TRIG  122 12/25/2014   HDL 40 12/25/2014   LDLDIRECT 82 11/09/2012   LDLCALC 79 12/25/2014   ALT 23 12/25/2014   AST 21 12/25/2014   NA 137 12/25/2014   K 5.1 12/25/2014   CL 99 12/25/2014   CREATININE 1.21 12/25/2014   BUN 21 12/25/2014   CO2 23 12/25/2014   TSH 2.410 11/10/2013   HGBA1C 5.7* 11/10/2013    Nm Myocar Multi W/spect W/wall Motion / Ef  11/12/2013  CLINICAL DATA:  Chest pain. EXAM: MYOCARDIAL IMAGING WITH SPECT (REST AND EXERCISE - 2 DAY PROTOCOL) GATED LEFT VENTRICULAR WALL MOTION STUDY LEFT VENTRICULAR EJECTION FRACTION TECHNIQUE: Standard myocardial SPECT imaging was performed after resting intravenous injection of 30 mCi Tc-75m sestamibi. On a different day, exercise tolerance test was performed by the patient under the supervision of the Cardiology staff. At peak-stress, 30 mCi Tc-43m sestamibi was injected intravenously and standard myocardial SPECT imaging was performed. Quantitative gated imaging was also performed to evaluate left ventricular wall motion, and estimate left ventricular ejection fraction. COMPARISON:  None. FINDINGS: The SPECT stress and rest images demonstrate slight decreased activity in the lateral wall on both sets of images. It appears slightly more pronounced on stress images and could not exclude mild peri-infarct ischemia. The gated SPECT images demonstrate lateral wall motion abnormality. The estimated ejection fraction  was 76%. IMPRESSION: Laterall wall scar with probable mild peri-infarct ischemia. Ejection fraction calculated at 76%. Electronically Signed   By: Kalman Jewels M.D.   On: 11/12/2013 14:26    Assessment & Plan:   Yader was seen today for abdominal pain.  Diagnoses and all orders for this visit:  Essential hypertension  Gastroesophageal reflux disease without esophagitis  CKD (chronic kidney disease), stage III   I am having Mr. Beckers maintain his vitamin C, vitamin B-12, aspirin EC, Vitamin D3, multivitamin with minerals,  calcium citrate-vitamin D, atorvastatin, amLODipine, omeprazole, gabapentin, and lisinopril.  No orders of the defined types were placed in this encounter.     Follow-up: Return in about 4 months (around 07/06/2015), or if symptoms worsen or fail to improve, for hypertension, CPE, cholesterol.  Claretta Fraise, M.D.

## 2015-03-06 LAB — CMP14+EGFR
A/G RATIO: 1.7 (ref 1.1–2.5)
ALT: 15 IU/L (ref 0–44)
AST: 25 IU/L (ref 0–40)
Albumin: 4.1 g/dL (ref 3.6–4.8)
Alkaline Phosphatase: 72 IU/L (ref 39–117)
BUN/Creatinine Ratio: 13 (ref 10–22)
BUN: 16 mg/dL (ref 8–27)
Bilirubin Total: 0.6 mg/dL (ref 0.0–1.2)
CHLORIDE: 100 mmol/L (ref 97–106)
CO2: 24 mmol/L (ref 18–29)
CREATININE: 1.22 mg/dL (ref 0.76–1.27)
Calcium: 9.5 mg/dL (ref 8.6–10.2)
GFR calc Af Amer: 70 mL/min/{1.73_m2} (ref >59)
GFR calc non Af Amer: 61 mL/min/{1.73_m2} (ref >59)
Globulin, Total: 2.4 g/dL (ref 1.5–4.5)
Glucose: 100 mg/dL — ABNORMAL HIGH (ref 65–99)
POTASSIUM: 5.4 mmol/L — AB (ref 3.5–5.2)
SODIUM: 142 mmol/L (ref 136–144)
Total Protein: 6.5 g/dL (ref 6.0–8.5)

## 2015-03-06 LAB — CBC WITH DIFFERENTIAL/PLATELET
BASOS ABS: 0 10*3/uL (ref 0.0–0.2)
BASOS: 1 %
EOS (ABSOLUTE): 0.2 10*3/uL (ref 0.0–0.4)
Eos: 3 %
HEMOGLOBIN: 15.3 g/dL (ref 12.6–17.7)
Hematocrit: 45.5 % (ref 37.5–51.0)
IMMATURE GRANS (ABS): 0 10*3/uL (ref 0.0–0.1)
Immature Granulocytes: 0 %
LYMPHS: 35 %
Lymphocytes Absolute: 1.9 10*3/uL (ref 0.7–3.1)
MCH: 31.8 pg (ref 26.6–33.0)
MCHC: 33.6 g/dL (ref 31.5–35.7)
MCV: 95 fL (ref 79–97)
MONOCYTES: 9 %
Monocytes Absolute: 0.5 10*3/uL (ref 0.1–0.9)
NEUTROS ABS: 2.9 10*3/uL (ref 1.4–7.0)
NEUTROS PCT: 52 %
PLATELETS: 217 10*3/uL (ref 150–379)
RBC: 4.81 x10E6/uL (ref 4.14–5.80)
RDW: 13.5 % (ref 12.3–15.4)
WBC: 5.6 10*3/uL (ref 3.4–10.8)

## 2015-03-06 LAB — LIPASE: LIPASE: 14 U/L (ref 0–59)

## 2015-03-12 ENCOUNTER — Other Ambulatory Visit (INDEPENDENT_AMBULATORY_CARE_PROVIDER_SITE_OTHER): Payer: Commercial Managed Care - HMO

## 2015-03-12 DIAGNOSIS — N183 Chronic kidney disease, stage 3 unspecified: Secondary | ICD-10-CM

## 2015-03-12 DIAGNOSIS — E875 Hyperkalemia: Secondary | ICD-10-CM | POA: Diagnosis not present

## 2015-03-12 DIAGNOSIS — E559 Vitamin D deficiency, unspecified: Secondary | ICD-10-CM

## 2015-03-12 DIAGNOSIS — I10 Essential (primary) hypertension: Secondary | ICD-10-CM

## 2015-03-12 DIAGNOSIS — E7800 Pure hypercholesterolemia, unspecified: Secondary | ICD-10-CM

## 2015-03-12 NOTE — Progress Notes (Signed)
Lab only recheck potassium

## 2015-03-13 LAB — CMP14+EGFR
A/G RATIO: 1.6 (ref 1.1–2.5)
ALT: 16 IU/L (ref 0–44)
AST: 23 IU/L (ref 0–40)
Albumin: 4 g/dL (ref 3.6–4.8)
Alkaline Phosphatase: 73 IU/L (ref 39–117)
BILIRUBIN TOTAL: 1.1 mg/dL (ref 0.0–1.2)
BUN/Creatinine Ratio: 13 (ref 10–22)
BUN: 14 mg/dL (ref 8–27)
CALCIUM: 9.6 mg/dL (ref 8.6–10.2)
CHLORIDE: 98 mmol/L (ref 97–106)
CO2: 25 mmol/L (ref 18–29)
Creatinine, Ser: 1.08 mg/dL (ref 0.76–1.27)
GFR calc Af Amer: 81 mL/min/{1.73_m2} (ref >59)
GFR calc non Af Amer: 70 mL/min/{1.73_m2} (ref >59)
Globulin, Total: 2.5 g/dL (ref 1.5–4.5)
Glucose: 108 mg/dL — ABNORMAL HIGH (ref 65–99)
POTASSIUM: 4.4 mmol/L (ref 3.5–5.2)
Sodium: 138 mmol/L (ref 136–144)
Total Protein: 6.5 g/dL (ref 6.0–8.5)

## 2015-03-14 ENCOUNTER — Other Ambulatory Visit (HOSPITAL_COMMUNITY): Payer: Medicare HMO

## 2015-03-21 ENCOUNTER — Ambulatory Visit (HOSPITAL_COMMUNITY)
Admission: RE | Admit: 2015-03-21 | Discharge: 2015-03-21 | Disposition: A | Discharge status: Home / Self Care | Payer: Commercial Managed Care - HMO | Source: Ambulatory Visit | Attending: Family Medicine | Admitting: Family Medicine

## 2015-03-21 DIAGNOSIS — R1011 Right upper quadrant pain: Secondary | ICD-10-CM

## 2015-03-21 DIAGNOSIS — R932 Abnormal findings on diagnostic imaging of liver and biliary tract: Secondary | ICD-10-CM | POA: Diagnosis not present

## 2015-03-21 DIAGNOSIS — K828 Other specified diseases of gallbladder: Secondary | ICD-10-CM | POA: Diagnosis not present

## 2015-03-21 DIAGNOSIS — K8020 Calculus of gallbladder without cholecystitis without obstruction: Secondary | ICD-10-CM | POA: Insufficient documentation

## 2015-03-21 DIAGNOSIS — K838 Other specified diseases of biliary tract: Secondary | ICD-10-CM | POA: Insufficient documentation

## 2015-03-22 ENCOUNTER — Other Ambulatory Visit: Payer: Self-pay | Admitting: *Deleted

## 2015-03-22 DIAGNOSIS — K8010 Calculus of gallbladder with chronic cholecystitis without obstruction: Secondary | ICD-10-CM

## 2015-04-10 ENCOUNTER — Ambulatory Visit (INDEPENDENT_AMBULATORY_CARE_PROVIDER_SITE_OTHER): Payer: Commercial Managed Care - HMO | Admitting: *Deleted

## 2015-04-10 VITALS — BP 134/72 | HR 78

## 2015-04-10 DIAGNOSIS — I10 Essential (primary) hypertension: Secondary | ICD-10-CM

## 2015-04-10 NOTE — Patient Instructions (Signed)
Hypertension Hypertension, commonly called high blood pressure, is when the force of blood pumping through your arteries is too strong. Your arteries are the blood vessels that carry blood from your heart throughout your body. A blood pressure reading consists of a higher number over a lower number, such as 110/72. The higher number (systolic) is the pressure inside your arteries when your heart pumps. The lower number (diastolic) is the pressure inside your arteries when your heart relaxes. Ideally you want your blood pressure below 120/80. Hypertension forces your heart to work harder to pump blood. Your arteries may become narrow or stiff. Having untreated or uncontrolled hypertension can cause heart attack, stroke, kidney disease, and other problems. RISK FACTORS Some risk factors for high blood pressure are controllable. Others are not.  Risk factors you cannot control include:   Race. You may be at higher risk if you are African American.  Age. Risk increases with age.  Gender. Men are at higher risk than women before age 45 years. After age 65, women are at higher risk than men. Risk factors you can control include:  Not getting enough exercise or physical activity.  Being overweight.  Getting too much fat, sugar, calories, or salt in your diet.  Drinking too much alcohol. SIGNS AND SYMPTOMS Hypertension does not usually cause signs or symptoms. Extremely high blood pressure (hypertensive crisis) may cause headache, anxiety, shortness of breath, and nosebleed. DIAGNOSIS To check if you have hypertension, your health care provider will measure your blood pressure while you are seated, with your arm held at the level of your heart. It should be measured at least twice using the same arm. Certain conditions can cause a difference in blood pressure between your right and left arms. A blood pressure reading that is higher than normal on one occasion does not mean that you need treatment. If  it is not clear whether you have high blood pressure, you may be asked to return on a different day to have your blood pressure checked again. Or, you may be asked to monitor your blood pressure at home for 1 or more weeks. TREATMENT Treating high blood pressure includes making lifestyle changes and possibly taking medicine. Living a healthy lifestyle can help lower high blood pressure. You may need to change some of your habits. Lifestyle changes may include:  Following the DASH diet. This diet is high in fruits, vegetables, and whole grains. It is low in salt, red meat, and added sugars.  Keep your sodium intake below 2,300 mg per day.  Getting at least 30-45 minutes of aerobic exercise at least 4 times per week.  Losing weight if necessary.  Not smoking.  Limiting alcoholic beverages.  Learning ways to reduce stress. Your health care provider may prescribe medicine if lifestyle changes are not enough to get your blood pressure under control, and if one of the following is true:  You are 18-59 years of age and your systolic blood pressure is above 140.  You are 60 years of age or older, and your systolic blood pressure is above 150.  Your diastolic blood pressure is above 90.  You have diabetes, and your systolic blood pressure is over 140 or your diastolic blood pressure is over 90.  You have kidney disease and your blood pressure is above 140/90.  You have heart disease and your blood pressure is above 140/90. Your personal target blood pressure may vary depending on your medical conditions, your age, and other factors. HOME CARE INSTRUCTIONS    Have your blood pressure rechecked as directed by your health care provider.   Take medicines only as directed by your health care provider. Follow the directions carefully. Blood pressure medicines must be taken as prescribed. The medicine does not work as well when you skip doses. Skipping doses also puts you at risk for  problems.  Do not smoke.   Monitor your blood pressure at home as directed by your health care provider. SEEK MEDICAL CARE IF:   You think you are having a reaction to medicines taken.  You have recurrent headaches or feel dizzy.  You have swelling in your ankles.  You have trouble with your vision. SEEK IMMEDIATE MEDICAL CARE IF:  You develop a severe headache or confusion.  You have unusual weakness, numbness, or feel faint.  You have severe chest or abdominal pain.  You vomit repeatedly.  You have trouble breathing. MAKE SURE YOU:   Understand these instructions.  Will watch your condition.  Will get help right away if you are not doing well or get worse.   This information is not intended to replace advice given to you by your health care provider. Make sure you discuss any questions you have with your health care provider.   Document Released: 04/28/2005 Document Revised: 09/12/2014 Document Reviewed: 02/18/2013 Elsevier Interactive Patient Education 2016 [employer]  

## 2015-04-10 NOTE — Progress Notes (Signed)
Patient came in today for a bp check. Patients BP 134/72 mmHg  Pulse 78

## 2015-04-17 ENCOUNTER — Telehealth: Payer: Self-pay | Admitting: Family Medicine

## 2015-04-24 ENCOUNTER — Ambulatory Visit: Payer: Self-pay | Admitting: General Surgery

## 2015-04-24 DIAGNOSIS — K8020 Calculus of gallbladder without cholecystitis without obstruction: Secondary | ICD-10-CM | POA: Diagnosis not present

## 2015-04-24 NOTE — H&P (Signed)
History of Present Illness Dennis Ok MD; 04/24/2015 2:08 PM) Patient words: Evaluate gallbladder.  The patient is a 69 year old male who presents for evaluation of gall stones. The patient is a 69 year old male who is referred by Dr. Claretta Fraise for evaluation of symptomatic gallstones. The patient states she's had abdominal pain in the epigastrium right upper quadrant for approximately one to 2 years. He states that more recently may become more frequent. The patient is undergone ultrasound revealed multiple gallstones. His LFTs were within normal limits. Patient is unsure whether or not the pain is resolved with any particular food at this time.  The patient has had a previous laparoscopic gastric bypass in 2004 at [facility]   Other Problems Ivor Costa, CMA; 04/24/2015 1:48 PM) Arthritis Back Pain Gastroesophageal Reflux Disease High blood pressure Hypercholesterolemia Kidney Stone Sleep Apnea Umbilical Hernia Repair  Past Surgical History Ivor Costa, [place]; 04/24/2015 1:48 PM) Gastric Bypass Oral Surgery Resection of Stomach Tonsillectomy  Diagnostic Studies History Ivor Costa, CMA; 04/24/2015 1:48 PM) Colonoscopy 5-10 years ago  Allergies Ivor Costa, CMA; 04/24/2015 1:48 PM) No Known Drug Allergies12/13/2016  Medication History Ivor Costa, CMA; 04/24/2015 1:48 PM) AmLODIPine Besylate (5MG  Tablet, Oral) Active. Cialis (20MG  Tablet, Oral) Active. Lisinopril (10MG  Tablet, Oral) Active. Gabapentin (600MG  Tablet, Oral) Active. Atorvastatin Calcium (80MG  Tablet, Oral) Active.  Social History Ivor Costa, [place]; 04/24/2015 1:48 PM) Alcohol use Remotely quit alcohol use. Caffeine use Coffee. No drug use Tobacco use Former smoker.  Family History Ivor Costa, [place]; 04/24/2015 1:48 PM) Arthritis Father. Cerebrovascular Accident Father. Colon Polyps Mother. Depression Daughter. Diabetes Mellitus Father. Heart  Disease Father. Hypertension Father. Kidney Disease Father. Migraine Headache Daughter. Ovarian Cancer Mother. Respiratory Condition Father.    Review of Systems Ivor Costa CMA; 04/24/2015 1:48 PM) General Not Present- Appetite Loss, Chills, Fatigue, Fever, Night Sweats, Weight Gain and Weight Loss. Skin Not Present- Change in Wart/Mole, Dryness, Hives, Jaundice, New Lesions, Non-Healing Wounds, Rash and Ulcer. HEENT Present- Ringing in the Ears and Wears glasses/contact lenses. Not Present- Earache, Hearing Loss, Hoarseness, Nose Bleed, Oral Ulcers, Seasonal Allergies, Sinus Pain, Sore Throat, Visual Disturbances and Yellow Eyes. Respiratory Not Present- Bloody sputum, Chronic Cough, Difficulty Breathing, Snoring and Wheezing. Breast Not Present- Breast Mass, Breast Pain, Nipple Discharge and Skin Changes. Cardiovascular Not Present- Chest Pain, Difficulty Breathing Lying Down, Leg Cramps, Palpitations, Rapid Heart Rate, Shortness of Breath and Swelling of Extremities. Gastrointestinal Not Present- Abdominal Pain, Bloating, Bloody Stool, Change in Bowel Habits, Chronic diarrhea, Constipation, Difficulty Swallowing, Excessive gas, Gets full quickly at meals, Hemorrhoids, Indigestion, Nausea, Rectal Pain and Vomiting. Male Genitourinary Not Present- Blood in Urine, Change in Urinary Stream, Frequency, Impotence, Nocturia, Painful Urination, Urgency and Urine Leakage. Musculoskeletal Present- Back Pain and Joint Pain. Not Present- Joint Stiffness, Muscle Pain, Muscle Weakness and Swelling of Extremities. Neurological Not Present- Decreased Memory, Fainting, Headaches, Numbness, Seizures, Tingling, Tremor, Trouble walking and Weakness. Psychiatric Not Present- Anxiety, Bipolar, Change in Sleep Pattern, Depression, Fearful and Frequent crying. Endocrine Not Present- Cold Intolerance, Excessive Hunger, Hair Changes, Heat Intolerance, Hot flashes and New Diabetes. Hematology Not Present-  Easy Bruising, Excessive bleeding, Gland problems, HIV and Persistent Infections.  Vitals Ivor Costa CMA; 04/24/2015 1:48 PM) 04/24/2015 1:47 PM Weight: 291.6 lb Height: 70in Body Surface Area: 2.45 m Body Mass Index: 41.84 kg/m  Temp.: 97.44F(Temporal)  Pulse: 72 (Regular)  Resp.: 18 (Unlabored)  BP: 134/72 (Sitting, Left Arm, Standard)       Physical Exam Dennis Ok, MD; 04/24/2015 2:8 PM)  General Mental Status-Alert. General Appearance-Consistent with stated age. Hydration-Well hydrated. Voice-Normal.  Head and Neck Head-normocephalic, atraumatic with no lesions or palpable masses.  Eye Eyeball - Bilateral-Extraocular movements intact. Sclera/Conjunctiva - Bilateral-No scleral icterus.  Chest and Lung Exam Chest and lung exam reveals -quiet, even and easy respiratory effort with no use of accessory muscles. Inspection Chest Wall - Normal. Back - normal.  Cardiovascular Cardiovascular examination reveals -normal heart sounds, regular rate and rhythm with no murmurs.  Abdomen Inspection Normal Exam - No Hernias. Palpation/Percussion Normal exam - Soft, Non Tender, No Rebound tenderness, No Rigidity (guarding) and No hepatosplenomegaly. Auscultation Normal exam - Bowel sounds normal.  Neurologic Neurologic evaluation reveals -alert and oriented x 3 with no impairment of recent or remote memory. Mental Status-Normal.  Musculoskeletal Normal Exam - Left-Upper Extremity Strength Normal and Lower Extremity Strength Normal. Normal Exam - Right-Upper Extremity Strength Normal, Lower Extremity Weakness.    Assessment & Plan Dennis Ok MD; 04/24/2015 2:08 PM) SYMPTOMATIC CHOLELITHIASIS (K80.20) Impression: 69 year old male with symptomatic cholelithiasis  1. The patient would like to proceed to the operating room for a lap Cholecystectomy  2. Risks and benefits were discussed with the patient to generally  include, but not limited to: infection, bleeding, possible need for post op ERCP, damage to the bile ducts, bile leak, and possible need for further surgery. Alternatives were offered and described. All questions were answered and the patient voiced understanding of the procedure and wishes to proceed at this point with a laparoscopic cholecystectomy

## 2015-05-17 ENCOUNTER — Encounter: Payer: Self-pay | Admitting: Gastroenterology

## 2015-06-11 ENCOUNTER — Encounter (HOSPITAL_COMMUNITY): Payer: Self-pay

## 2015-06-11 NOTE — Patient Instructions (Addendum)
YOUR PROCEDURE IS SCHEDULED ON :  06/15/15  REPORT TO [facility] MAIN ENTRANCE FOLLOW SIGNS TO EAST ELEVATOR - GO TO 3rd FLOOR CHECK IN AT 3 EAST NURSES STATION (SHORT STAY) AT:  5:30 AM  CALL THIS NUMBER IF YOU HAVE PROBLEMS THE MORNING OF SURGERY [phone]  REMEMBER:ONLY 1 PER PERSON MAY GO TO SHORT STAY WITH YOU TO GET READY THE MORNING OF YOUR SURGERY  DO NOT EAT FOOD OR DRINK LIQUIDS AFTER MIDNIGHT  TAKE THESE MEDICINES THE MORNING OF SURGERY: AMLODIPINE / OMEPRAZOLE   YOU MAY NOT HAVE ANY METAL ON YOUR BODY INCLUDING HAIR PINS AND PIERCING'S. DO NOT WEAR JEWELRY, MAKEUP, LOTIONS, POWDERS OR PERFUMES. DO NOT WEAR NAIL POLISH. DO NOT SHAVE 48 HRS PRIOR TO SURGERY. MEN MAY SHAVE FACE AND NECK.  DO NOT Hide[place]. [facility] IS NOT RESPONSIBLE FOR VALUABLES.  CONTACTS, DENTURES OR PARTIALS MAY NOT BE WORN TO SURGERY. LEAVE SUITCASE IN CAR. CAN BE BROUGHT TO ROOM AFTER SURGERY.  PATIENTS DISCHARGED THE DAY OF SURGERY WILL NOT BE ALLOWED TO DRIVE HOME.  PLEASE READ OVER THE FOLLOWING INSTRUCTION SHEETS _________________________________________________________________________________                                          [facility] - PREPARING FOR SURGERY  Before surgery, you can play an important role.  Because skin is not sterile, your skin needs to be as free of germs as possible.  You can reduce the number of germs on your skin by washing with CHG (chlorahexidine gluconate) soap before surgery.  CHG is an antiseptic cleaner which kills germs and bonds with the skin to continue killing germs even after washing. Please DO NOT use if you have an allergy to CHG or antibacterial soaps.  If your skin becomes reddened/irritated stop using the CHG and inform your nurse when you arrive at Short Stay. Do not shave (including legs and underarms) for at least 48 hours prior to the first CHG shower.  You may shave your face. Please follow these  instructions carefully:   1.  Shower with CHG Soap the night before surgery and the  morning of Surgery.   2.  If you choose to wash your hair, wash your hair first as usual with your  normal  Shampoo.   3.  After you shampoo, rinse your hair and body thoroughly to remove the  shampoo.                                         4.  Use CHG as you would any other liquid soap.  You can apply chg directly  to the skin and wash . Gently wash with scrungie or clean wascloth    5.  Apply the CHG Soap to your body ONLY FROM THE NECK DOWN.   Do not use on open                           Wound or open sores. Avoid contact with eyes, ears mouth and genitals (private parts).                        Genitals (private parts) with your normal  soap.              6.  Wash thoroughly, paying special attention to the area where your surgery  will be performed.   7.  Thoroughly rinse your body with warm water from the neck down.   8.  DO NOT shower/wash with your normal soap after using and rinsing off  the CHG Soap .                9.  Pat yourself dry with a clean towel.             10.  Wear clean night clothes to bed after shower             11.  Place clean sheets on your bed the night of your first shower and do not  sleep with pets.  Day of Surgery : Do not apply any lotions/deodorants the morning of surgery.  Please wear clean clothes to the hospital/surgery center.  FAILURE TO FOLLOW THESE INSTRUCTIONS MAY RESULT IN THE CANCELLATION OF YOUR SURGERY    PATIENT SIGNATURE_________________________________  ______________________________________________________________________

## 2015-06-13 ENCOUNTER — Encounter (HOSPITAL_COMMUNITY): Payer: Self-pay

## 2015-06-13 ENCOUNTER — Encounter (HOSPITAL_COMMUNITY)
Admission: RE | Admit: 2015-06-13 | Discharge: 2015-06-13 | Disposition: A | Discharge status: Home / Self Care | Payer: Commercial Managed Care - HMO | Source: Ambulatory Visit | Attending: General Surgery | Admitting: General Surgery

## 2015-06-13 DIAGNOSIS — Z87891 Personal history of nicotine dependence: Secondary | ICD-10-CM | POA: Diagnosis not present

## 2015-06-13 DIAGNOSIS — I10 Essential (primary) hypertension: Secondary | ICD-10-CM | POA: Diagnosis not present

## 2015-06-13 DIAGNOSIS — K760 Fatty (change of) liver, not elsewhere classified: Secondary | ICD-10-CM | POA: Diagnosis not present

## 2015-06-13 DIAGNOSIS — Z930 Tracheostomy status: Secondary | ICD-10-CM | POA: Diagnosis not present

## 2015-06-13 DIAGNOSIS — K219 Gastro-esophageal reflux disease without esophagitis: Secondary | ICD-10-CM | POA: Diagnosis not present

## 2015-06-13 DIAGNOSIS — K8020 Calculus of gallbladder without cholecystitis without obstruction: Secondary | ICD-10-CM | POA: Diagnosis present

## 2015-06-13 DIAGNOSIS — E7800 Pure hypercholesterolemia, unspecified: Secondary | ICD-10-CM | POA: Diagnosis not present

## 2015-06-13 DIAGNOSIS — Z9884 Bariatric surgery status: Secondary | ICD-10-CM | POA: Diagnosis not present

## 2015-06-13 DIAGNOSIS — K8010 Calculus of gallbladder with chronic cholecystitis without obstruction: Secondary | ICD-10-CM | POA: Diagnosis not present

## 2015-06-13 DIAGNOSIS — Z6841 Body Mass Index (BMI) 40.0 and over, adult: Secondary | ICD-10-CM | POA: Diagnosis not present

## 2015-06-13 DIAGNOSIS — Z79899 Other long term (current) drug therapy: Secondary | ICD-10-CM | POA: Diagnosis not present

## 2015-06-13 DIAGNOSIS — G4730 Sleep apnea, unspecified: Secondary | ICD-10-CM | POA: Diagnosis not present

## 2015-06-13 DIAGNOSIS — M1990 Unspecified osteoarthritis, unspecified site: Secondary | ICD-10-CM | POA: Diagnosis not present

## 2015-06-13 HISTORY — DX: Tracheostomy status: Z93.0

## 2015-06-13 HISTORY — DX: Unspecified osteoarthritis, unspecified site: M19.90

## 2015-06-13 LAB — BASIC METABOLIC PANEL
ANION GAP: 8 (ref 5–15)
BUN: 22 mg/dL — ABNORMAL HIGH (ref 6–20)
CALCIUM: 9.4 mg/dL (ref 8.9–10.3)
CHLORIDE: 105 mmol/L (ref 101–111)
CO2: 25 mmol/L (ref 22–32)
Creatinine, Ser: 1.2 mg/dL (ref 0.61–1.24)
GFR calc non Af Amer: >60 mL/min (ref >60)
Glucose, Bld: 103 mg/dL — ABNORMAL HIGH (ref 65–99)
Potassium: 4.6 mmol/L (ref 3.5–5.1)
SODIUM: 138 mmol/L (ref 135–145)

## 2015-06-13 LAB — CBC
HCT: 44.8 % (ref 39.0–52.0)
HEMOGLOBIN: 15.2 g/dL (ref 13.0–17.0)
MCH: 30.8 pg (ref 26.0–34.0)
MCHC: 33.9 g/dL (ref 30.0–36.0)
MCV: 90.9 fL (ref 78.0–100.0)
Platelets: 211 10*3/uL (ref 150–400)
RBC: 4.93 MIL/uL (ref 4.22–5.81)
RDW: 13 % (ref 11.5–15.5)
WBC: 6.7 10*3/uL (ref 4.0–10.5)

## 2015-06-14 MED ORDER — DEXTROSE 5 % IV SOLN
3.0000 g | INTRAVENOUS | Status: AC
  Administered 2015-06-15: 3 g via INTRAVENOUS
  Filled 2015-06-14 (×2): qty 3000

## 2015-06-14 NOTE — Anesthesia Preprocedure Evaluation (Addendum)
Anesthesia Evaluation  Patient identified by MRN, date of birth, ID band Patient awake    Reviewed: Allergy & Precautions, H&P , NPO status , Patient's Chart, lab work & pertinent test results  History of Anesthesia Complications Negative for: history of anesthetic complications  Airway Mallampati: II  TM Distance: >3 FB Neck ROM: Full   Comment: S/P Tracheostomy  Dental no notable dental hx.    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 on medications Normal cardiovascular exam Rhythm:Regular Rate:Normal     Neuro/Psych negative neurological ROS  negative psych ROS   GI/Hepatic Neg liver ROS, GERD  Medicated,  Endo/Other  Morbid obesity  Renal/GU Renal InsufficiencyRenal disease  negative genitourinary   Musculoskeletal negative musculoskeletal ROS (+)   Abdominal (+) + obese,   Peds negative pediatric ROS (+)  Hematology negative hematology ROS (+)   Anesthesia Other Findings   Reproductive/Obstetrics negative OB ROS                           Anesthesia Physical Anesthesia Plan  ASA: III  Anesthesia Plan: General   Post-op Pain Management:    Induction: Intravenous  Airway Management Planned: Tracheostomy  Additional Equipment:   Intra-op Plan:   Post-operative Plan: Extubation in OR  Informed Consent: I have reviewed the patients History and Physical, chart, labs and discussed the procedure including the risks, benefits and alternatives for the proposed anesthesia with the patient or authorized representative who has indicated his/her understanding and acceptance.   Dental advisory given  Plan Discussed with: CRNA  Anesthesia Plan Comments: (Risks/benefits of general anesthesia discussed with patient including  risk of damage to teeth, lips, gum, and tongue, nausea/vomiting, allergic reactions to medications, and the possibility of heart attack, stroke and death.  All patient questions answered.  Patient wishes to proceed.  Prior anesthetics with reinforced 6.79mm ETT inserted through trach stoma. Have replacement trach in room.)       Anesthesia Quick Evaluation

## 2015-06-15 ENCOUNTER — Ambulatory Visit (HOSPITAL_COMMUNITY): Payer: Commercial Managed Care - HMO | Admitting: Anesthesiology

## 2015-06-15 ENCOUNTER — Ambulatory Visit (HOSPITAL_COMMUNITY)
Admission: RE | Admit: 2015-06-15 | Discharge: 2015-06-15 | Disposition: A | Discharge status: Home / Self Care | Payer: Commercial Managed Care - HMO | Source: Ambulatory Visit | Attending: General Surgery | Admitting: General Surgery

## 2015-06-15 ENCOUNTER — Encounter (HOSPITAL_COMMUNITY)
Admission: RE | Disposition: A | Discharge status: Home / Self Care | Payer: Self-pay | Source: Ambulatory Visit | Attending: General Surgery

## 2015-06-15 ENCOUNTER — Encounter (HOSPITAL_COMMUNITY): Payer: Self-pay | Admitting: *Deleted

## 2015-06-15 DIAGNOSIS — Z79899 Other long term (current) drug therapy: Secondary | ICD-10-CM | POA: Diagnosis not present

## 2015-06-15 DIAGNOSIS — I10 Essential (primary) hypertension: Secondary | ICD-10-CM | POA: Insufficient documentation

## 2015-06-15 DIAGNOSIS — I129 Hypertensive chronic kidney disease with stage 1 through stage 4 chronic kidney disease, or unspecified chronic kidney disease: Secondary | ICD-10-CM | POA: Diagnosis not present

## 2015-06-15 DIAGNOSIS — K219 Gastro-esophageal reflux disease without esophagitis: Secondary | ICD-10-CM | POA: Diagnosis not present

## 2015-06-15 DIAGNOSIS — Z87891 Personal history of nicotine dependence: Secondary | ICD-10-CM | POA: Diagnosis not present

## 2015-06-15 DIAGNOSIS — K760 Fatty (change of) liver, not elsewhere classified: Secondary | ICD-10-CM | POA: Insufficient documentation

## 2015-06-15 DIAGNOSIS — Z6841 Body Mass Index (BMI) 40.0 and over, adult: Secondary | ICD-10-CM | POA: Insufficient documentation

## 2015-06-15 DIAGNOSIS — M1990 Unspecified osteoarthritis, unspecified site: Secondary | ICD-10-CM | POA: Diagnosis not present

## 2015-06-15 DIAGNOSIS — Z9884 Bariatric surgery status: Secondary | ICD-10-CM | POA: Diagnosis not present

## 2015-06-15 DIAGNOSIS — E7800 Pure hypercholesterolemia, unspecified: Secondary | ICD-10-CM | POA: Insufficient documentation

## 2015-06-15 DIAGNOSIS — G4730 Sleep apnea, unspecified: Secondary | ICD-10-CM | POA: Diagnosis not present

## 2015-06-15 DIAGNOSIS — K8010 Calculus of gallbladder with chronic cholecystitis without obstruction: Secondary | ICD-10-CM | POA: Insufficient documentation

## 2015-06-15 DIAGNOSIS — Z930 Tracheostomy status: Secondary | ICD-10-CM | POA: Insufficient documentation

## 2015-06-15 DIAGNOSIS — K8020 Calculus of gallbladder without cholecystitis without obstruction: Secondary | ICD-10-CM | POA: Diagnosis not present

## 2015-06-15 DIAGNOSIS — N183 Chronic kidney disease, stage 3 (moderate): Secondary | ICD-10-CM | POA: Diagnosis not present

## 2015-06-15 HISTORY — PX: CHOLECYSTECTOMY: SHX55

## 2015-06-15 SURGERY — LAPAROSCOPIC CHOLECYSTECTOMY
Anesthesia: General

## 2015-06-15 MED ORDER — EPHEDRINE SULFATE 50 MG/ML IJ SOLN
INTRAMUSCULAR | Status: AC
  Filled 2015-06-15: qty 1

## 2015-06-15 MED ORDER — EPHEDRINE SULFATE 50 MG/ML IJ SOLN: INTRAMUSCULAR | Status: DC | PRN

## 2015-06-15 MED ORDER — CHLORHEXIDINE GLUCONATE 4 % EX LIQD: 1.0000 "application " | Freq: Once | CUTANEOUS | Status: DC

## 2015-06-15 MED ORDER — SUGAMMADEX SODIUM 500 MG/5ML IV SOLN
INTRAVENOUS | Status: AC
  Filled 2015-06-15: qty 5

## 2015-06-15 MED ORDER — GLYCOPYRROLATE 0.2 MG/ML IJ SOLN
INTRAMUSCULAR | Status: AC
Start: 2015-06-15 — End: 2015-06-15
  Filled 2015-06-15: qty 1

## 2015-06-15 MED ORDER — ONDANSETRON HCL 4 MG/2ML IJ SOLN
INTRAMUSCULAR | Status: AC
  Filled 2015-06-15: qty 2

## 2015-06-15 MED ORDER — DEXAMETHASONE SODIUM PHOSPHATE 10 MG/ML IJ SOLN
INTRAMUSCULAR | Status: DC | PRN
  Administered 2015-06-15: 10 mg via INTRAVENOUS

## 2015-06-15 MED ORDER — BUPIVACAINE-EPINEPHRINE (PF) 0.25% -1:200000 IJ SOLN
INTRAMUSCULAR | Status: AC
  Filled 2015-06-15: qty 30

## 2015-06-15 MED ORDER — SODIUM CHLORIDE 0.9% FLUSH: 3.0000 mL | INTRAVENOUS | Status: DC | PRN

## 2015-06-15 MED ORDER — SODIUM CHLORIDE 0.9% FLUSH: 3.0000 mL | Freq: Two times a day (BID) | INTRAVENOUS | Status: DC

## 2015-06-15 MED ORDER — FENTANYL CITRATE (PF) 100 MCG/2ML IJ SOLN
INTRAMUSCULAR | Status: DC | PRN
  Administered 2015-06-15: 100 ug via INTRAVENOUS
  Administered 2015-06-15: 50 ug via INTRAVENOUS

## 2015-06-15 MED ORDER — EPHEDRINE SULFATE 50 MG/ML IJ SOLN
INTRAMUSCULAR | Status: DC | PRN
  Administered 2015-06-15: 20 mg via INTRAVENOUS

## 2015-06-15 MED ORDER — MIDAZOLAM HCL 5 MG/5ML IJ SOLN
INTRAMUSCULAR | Status: DC | PRN
  Administered 2015-06-15: 2 mg via INTRAVENOUS

## 2015-06-15 MED ORDER — LIDOCAINE HCL (CARDIAC) 20 MG/ML IV SOLN
INTRAVENOUS | Status: DC | PRN
  Administered 2015-06-15: 100 mg via INTRAVENOUS

## 2015-06-15 MED ORDER — LACTATED RINGERS IV SOLN
INTRAVENOUS | Status: DC | PRN
  Administered 2015-06-15 (×2): via INTRAVENOUS

## 2015-06-15 MED ORDER — MIDAZOLAM HCL 2 MG/2ML IJ SOLN
INTRAMUSCULAR | Status: AC
  Filled 2015-06-15: qty 2

## 2015-06-15 MED ORDER — FENTANYL CITRATE (PF) 100 MCG/2ML IJ SOLN
INTRAMUSCULAR | Status: AC
  Filled 2015-06-15: qty 2

## 2015-06-15 MED ORDER — MORPHINE SULFATE (PF) 10 MG/ML IV SOLN: 2.0000 mg | INTRAVENOUS | Status: DC | PRN

## 2015-06-15 MED ORDER — ROCURONIUM BROMIDE 100 MG/10ML IV SOLN
INTRAVENOUS | Status: AC
  Filled 2015-06-15: qty 1

## 2015-06-15 MED ORDER — ONDANSETRON HCL 4 MG/2ML IJ SOLN: 4.0000 mg | Freq: Once | INTRAMUSCULAR | Status: DC | PRN

## 2015-06-15 MED ORDER — SUGAMMADEX SODIUM 500 MG/5ML IV SOLN
INTRAVENOUS | Status: DC | PRN
  Administered 2015-06-15: 250 mg via INTRAVENOUS

## 2015-06-15 MED ORDER — ACETAMINOPHEN 650 MG RE SUPP
650.0000 mg | RECTAL | Status: DC | PRN
  Filled 2015-06-15: qty 1

## 2015-06-15 MED ORDER — GLYCOPYRROLATE 0.2 MG/ML IJ SOLN
INTRAMUSCULAR | Status: DC | PRN
  Administered 2015-06-15: 0.2 mg via INTRAVENOUS

## 2015-06-15 MED ORDER — ROCURONIUM BROMIDE 100 MG/10ML IV SOLN
INTRAVENOUS | Status: DC | PRN
  Administered 2015-06-15: 30 mg via INTRAVENOUS
  Administered 2015-06-15 (×2): 10 mg via INTRAVENOUS

## 2015-06-15 MED ORDER — OXYCODONE-ACETAMINOPHEN 5-325 MG PO TABS: 1.0000 | ORAL_TABLET | ORAL | Status: DC | PRN

## 2015-06-15 MED ORDER — PROPOFOL 10 MG/ML IV BOLUS
INTRAVENOUS | Status: AC
  Filled 2015-06-15: qty 20

## 2015-06-15 MED ORDER — ONDANSETRON HCL 4 MG/2ML IJ SOLN
INTRAMUSCULAR | Status: DC | PRN
  Administered 2015-06-15: 4 mg via INTRAVENOUS

## 2015-06-15 MED ORDER — ACETAMINOPHEN 325 MG PO TABS: 650.0000 mg | ORAL_TABLET | ORAL | Status: DC | PRN

## 2015-06-15 MED ORDER — DEXAMETHASONE SODIUM PHOSPHATE 10 MG/ML IJ SOLN
INTRAMUSCULAR | Status: AC
  Filled 2015-06-15: qty 1

## 2015-06-15 MED ORDER — PROPOFOL 10 MG/ML IV BOLUS
INTRAVENOUS | Status: DC | PRN
  Administered 2015-06-15: 150 mg via INTRAVENOUS

## 2015-06-15 MED ORDER — BUPIVACAINE-EPINEPHRINE (PF) 0.25% -1:200000 IJ SOLN
INTRAMUSCULAR | Status: DC | PRN
Start: 2015-06-15 — End: 2015-06-15
  Administered 2015-06-15: 10 mL via PERINEURAL

## 2015-06-15 MED ORDER — LACTATED RINGERS IR SOLN
Status: DC | PRN
  Administered 2015-06-15: 1

## 2015-06-15 MED ORDER — FENTANYL CITRATE (PF) 250 MCG/5ML IJ SOLN
INTRAMUSCULAR | Status: AC
  Filled 2015-06-15: qty 5

## 2015-06-15 MED ORDER — OXYCODONE HCL 5 MG PO TABS
5.0000 mg | ORAL_TABLET | ORAL | Status: DC | PRN
  Administered 2015-06-15: 5 mg via ORAL
  Filled 2015-06-15: qty 1

## 2015-06-15 MED ORDER — FENTANYL CITRATE (PF) 100 MCG/2ML IJ SOLN
25.0000 ug | INTRAMUSCULAR | Status: DC | PRN
  Administered 2015-06-15: 25 ug via INTRAVENOUS

## 2015-06-15 MED ORDER — SODIUM CHLORIDE 0.9 % IV SOLN: 250.0000 mL | INTRAVENOUS | Status: DC | PRN

## 2015-06-15 MED ORDER — LIDOCAINE HCL (CARDIAC) 20 MG/ML IV SOLN
INTRAVENOUS | Status: AC
  Filled 2015-06-15: qty 5

## 2015-06-15 SURGICAL SUPPLY — 42 items
APL SKNCLS STERI-STRIP NONHPOA (GAUZE/BANDAGES/DRESSINGS)
APPLIER CLIP 5 13 M/L LIGAMAX5 (MISCELLANEOUS) ×3
APR CLP MED LRG 5 ANG JAW (MISCELLANEOUS) ×1
BENZOIN TINCTURE PRP APPL 2/3 (GAUZE/BANDAGES/DRESSINGS) IMPLANT
CABLE HIGH FREQUENCY MONO STRZ (ELECTRODE) ×3 IMPLANT
CHLORAPREP W/TINT 26ML (MISCELLANEOUS) ×3 IMPLANT
CLIP APPLIE 5 13 M/L LIGAMAX5 (MISCELLANEOUS) ×1 IMPLANT
CLIP LIGATING HEMO O LOK GREEN (MISCELLANEOUS) ×5 IMPLANT
CLOSURE WOUND 1/2 X4 (GAUZE/BANDAGES/DRESSINGS) ×1
COVER MAYO STAND STRL (DRAPES) IMPLANT
COVER SURGICAL LIGHT HANDLE (MISCELLANEOUS) ×3 IMPLANT
COVER TRANSDUCER ULTRASND (DRAPES) ×3 IMPLANT
DECANTER SPIKE VIAL GLASS SM (MISCELLANEOUS) ×1 IMPLANT
DEVICE TROCAR PUNCTURE CLOSURE (ENDOMECHANICALS) ×3 IMPLANT
DISSECTOR BLUNT TIP ENDO 5MM (MISCELLANEOUS) ×3 IMPLANT
DRAPE C-ARM 42X120 X-RAY (DRAPES) IMPLANT
DRAPE LAPAROSCOPIC ABDOMINAL (DRAPES) ×3 IMPLANT
DRAPE UTILITY XL STRL (DRAPES) ×3 IMPLANT
ELECT REM PT RETURN 9FT ADLT (ELECTROSURGICAL) ×3
ELECTRODE REM PT RTRN 9FT ADLT (ELECTROSURGICAL) ×1 IMPLANT
ENDOLOOP SUT PDS II  0 18 (SUTURE) ×2
ENDOLOOP SUT PDS II 0 18 (SUTURE) ×1 IMPLANT
GAUZE SPONGE 2X2 8PLY STRL LF (GAUZE/BANDAGES/DRESSINGS) ×1 IMPLANT
GAUZE SPONGE 4X4 12PLY STRL (GAUZE/BANDAGES/DRESSINGS) ×3 IMPLANT
GLOVE BIO SURGEON STRL SZ7.5 (GLOVE) ×3 IMPLANT
GOWN STRL REUS W/TWL XL LVL3 (GOWN DISPOSABLE) ×6 IMPLANT
HEMOSTAT SURGICEL 4X8 (HEMOSTASIS) IMPLANT
KIT BASIN OR (CUSTOM PROCEDURE TRAY) ×3 IMPLANT
NDL INSUFFLATION 14GA 120MM (NEEDLE) ×1 IMPLANT
NEEDLE INSUFFLATION 14GA 120MM (NEEDLE) ×3 IMPLANT
SCISSORS LAP 5X35 DISP (ENDOMECHANICALS) ×3 IMPLANT
SET CHOLANGIOGRAPH MIX (MISCELLANEOUS) IMPLANT
SET IRRIG TUBING LAPAROSCOPIC (IRRIGATION / IRRIGATOR) ×3 IMPLANT
SPONGE GAUZE 2X2 STER 10/PKG (GAUZE/BANDAGES/DRESSINGS) ×2
STRIP CLOSURE SKIN 1/2X4 (GAUZE/BANDAGES/DRESSINGS) ×2 IMPLANT
SUT MNCRL AB 4-0 PS2 18 (SUTURE) ×3 IMPLANT
TOWEL OR 17X26 10 PK STRL BLUE (TOWEL DISPOSABLE) ×3 IMPLANT
TOWEL OR NON WOVEN STRL DISP B (DISPOSABLE) ×3 IMPLANT
TRAY LAPAROSCOPIC (CUSTOM PROCEDURE TRAY) ×3 IMPLANT
TROCAR BLADELESS OPT 5 75 (ENDOMECHANICALS) ×3 IMPLANT
TROCAR SLEEVE XCEL 5X75 (ENDOMECHANICALS) ×3 IMPLANT
TROCAR XCEL NON-BLD 11X100MML (ENDOMECHANICALS) ×3 IMPLANT

## 2015-06-15 NOTE — Anesthesia Postprocedure Evaluation (Signed)
Anesthesia Post Note  Patient: Dennis Spencer  Procedure(s) Performed: Procedure(s) (LRB): LAPAROSCOPIC CHOLECYSTECTOMY (N/A)  Patient location during evaluation: PACU Anesthesia Type: General Level of consciousness: awake and alert Pain management: pain level controlled Vital Signs Assessment: post-procedure vital signs reviewed and stable Respiratory status: spontaneous breathing, nonlabored ventilation, respiratory function stable and patient connected to tracheostomy mask oxygen Cardiovascular status: blood pressure returned to baseline and stable Postop Assessment: no signs of nausea or vomiting Anesthetic complications: no Comments: 6.67mm Shiley cuffless trach placed after emergence. Patient doing well in recovery room.    Last Vitals:  Filed Vitals:   06/15/15 1015 06/15/15 1023  BP: 140/84 158/68  Pulse: 64 70  Temp:  36.4 C  Resp: 15 16    Last Pain:  Filed Vitals:   06/15/15 1109  PainSc: 5                  [name]

## 2015-06-15 NOTE — Progress Notes (Signed)
SS gave room 7  /  In 5 minutes.

## 2015-06-15 NOTE — Transfer of Care (Signed)
Immediate Anesthesia Transfer of Care Note  Patient: Dennis Spencer  Procedure(s) Performed: Procedure(s): LAPAROSCOPIC CHOLECYSTECTOMY (N/A)  Patient Location: PACU  Anesthesia Type:General  Level of Consciousness: awake  Airway & Oxygen Therapy: Patient Spontanous Breathing  Post-op Assessment: Report given to RN  Post vital signs: stable  Last Vitals:  Filed Vitals:   06/15/15 0550 06/15/15 0930  BP: 143/59   Pulse: 63 62  Temp: 36.6 C 36.7 C  Resp: 18 16    Complications: No apparent anesthesia complications

## 2015-06-15 NOTE — H&P (Signed)
The patient is a 70 year old male who presents for evaluation of gall stones. The patient is a 70 year old male who is referred by Dr. Claretta Fraise for evaluation of symptomatic gallstones. The patient states she's had abdominal pain in the epigastrium right upper quadrant for approximately one to 2 years. He states that more recently may become more frequent. The patient is undergone ultrasound revealed multiple gallstones. His LFTs were within normal limits. Patient is unsure whether or not the pain is resolved with any particular food at this time.  The patient has had a previous laparoscopic gastric bypass in 2004 at [facility].   Other Problems Ivor Costa, CMA; 04/24/2015 1:48 PM) Arthritis Back Pain Gastroesophageal Reflux Disease High blood pressure Hypercholesterolemia Kidney Stone Sleep Apnea Umbilical Hernia Repair  Past Surgical History Ivor Costa, [place]; 04/24/2015 1:48 PM) Gastric Bypass Oral Surgery Resection of Stomach Tonsillectomy  Diagnostic Studies History Ivor Costa, CMA; 04/24/2015 1:48 PM) Colonoscopy 5-10 years ago  Allergies Ivor Costa, CMA; 04/24/2015 1:48 PM) No Known Drug Allergies12/13/2016  Medication History Ivor Costa, CMA; 04/24/2015 1:48 PM) AmLODIPine Besylate (5MG  Tablet, Oral) Active. Cialis (20MG  Tablet, Oral) Active. Lisinopril (10MG  Tablet, Oral) Active. Gabapentin (600MG  Tablet, Oral) Active. Atorvastatin Calcium (80MG  Tablet, Oral) Active.  Social History Ivor Costa, [place]; 04/24/2015 1:48 PM) Alcohol use Remotely quit alcohol use. Caffeine use Coffee. No drug use Tobacco use Former smoker.  Family History Ivor Costa, [place]; 04/24/2015 1:48 PM) Arthritis Father. Cerebrovascular Accident Father. Colon Polyps Mother. Depression Daughter. Diabetes Mellitus Father. Heart Disease Father. Hypertension Father. Kidney Disease Father. Migraine Headache Daughter. Ovarian Cancer  Mother. Respiratory Condition Father.    Review of Systems Ivor Costa CMA; 04/24/2015 1:48 PM) General Not Present- Appetite Loss, Chills, Fatigue, Fever, Night Sweats, Weight Gain and Weight Loss. Skin Not Present- Change in Wart/Mole, Dryness, Hives, Jaundice, New Lesions, Non-Healing Wounds, Rash and Ulcer. HEENT Present- Ringing in the Ears and Wears glasses/contact lenses. Not Present- Earache, Hearing Loss, Hoarseness, Nose Bleed, Oral Ulcers, Seasonal Allergies, Sinus Pain, Sore Throat, Visual Disturbances and Yellow Eyes. Respiratory Not Present- Bloody sputum, Chronic Cough, Difficulty Breathing, Snoring and Wheezing. Breast Not Present- Breast Mass, Breast Pain, Nipple Discharge and Skin Changes. Cardiovascular Not Present- Chest Pain, Difficulty Breathing Lying Down, Leg Cramps, Palpitations, Rapid Heart Rate, Shortness of Breath and Swelling of Extremities. Gastrointestinal Not Present- Abdominal Pain, Bloating, Bloody Stool, Change in Bowel Habits, Chronic diarrhea, Constipation, Difficulty Swallowing, Excessive gas, Gets full quickly at meals, Hemorrhoids, Indigestion, Nausea, Rectal Pain and Vomiting. Male Genitourinary Not Present- Blood in Urine, Change in Urinary Stream, Frequency, Impotence, Nocturia, Painful Urination, Urgency and Urine Leakage. Musculoskeletal Present- Back Pain and Joint Pain. Not Present- Joint Stiffness, Muscle Pain, Muscle Weakness and Swelling of Extremities. Neurological Not Present- Decreased Memory, Fainting, Headaches, Numbness, Seizures, Tingling, Tremor, Trouble walking and Weakness. Psychiatric Not Present- Anxiety, Bipolar, Change in Sleep Pattern, Depression, Fearful and Frequent crying. Endocrine Not Present- Cold Intolerance, Excessive Hunger, Hair Changes, Heat Intolerance, Hot flashes and New Diabetes. Hematology Not Present- Easy Bruising, Excessive bleeding, Gland problems, HIV and Persistent Infections.  BP 143/59 mmHg  Pulse 63   Temp(Src) 97.8 F (36.6 C) (Oral)  Resp 18  Ht 5\' 10"  (1.778 m)  Wt 132.45 kg (292 lb)  BMI 41.90 kg/m2  SpO2 98%    Physical Exam Ralene Ok, MD; 04/24/2015 2:8 PM) General Mental Status-Alert. General Appearance-Consistent with stated age. Hydration-Well hydrated. Voice-Normal.  Head and Neck Head-normocephalic, atraumatic with no lesions or palpable masses. Tracheostomy in place  Eye Eyeball - Bilateral-Extraocular movements intact. Sclera/Conjunctiva - Bilateral-No scleral icterus.  Chest and Lung Exam Chest and lung exam reveals -quiet, even and easy respiratory effort with no use of accessory muscles. Inspection Chest Wall - Normal. Back - normal.  Cardiovascular Cardiovascular examination reveals -normal heart sounds, regular rate and rhythm with no murmurs.  Abdomen Inspection Normal Exam - No Hernias. Palpation/Percussion Normal exam - Soft, Non Tender, No Rebound tenderness, No Rigidity (guarding) and No hepatosplenomegaly. Auscultation Normal exam - Bowel sounds normal.  Neurologic Neurologic evaluation reveals -alert and oriented x 3 with no impairment of recent or remote memory. Mental Status-Normal.  Musculoskeletal Normal Exam - Left-Upper Extremity Strength Normal and Lower Extremity Strength Normal. Normal Exam - Right-Upper Extremity Strength Normal, Lower Extremity Weakness.    Assessment & Plan Ralene Ok MD; 04/24/2015 2:08 PM) SYMPTOMATIC CHOLELITHIASIS (K80.20) Impression: 70 year old male with symptomatic cholelithiasis  1. The patient would like to proceed to the operating room for a lap Cholecystectomy  2. Risks and benefits were discussed with the patient to generally include, but not limited to: infection, bleeding, possible need for post op ERCP, damage to the bile ducts, bile leak, and possible need for further surgery. Alternatives were offered and described. All questions were answered and  the patient voiced understanding of the procedure and wishes to proceed at this point with a laparoscopic cholecystectomy

## 2015-06-15 NOTE — Anesthesia Procedure Notes (Signed)
Procedure Name: Intubation Date/Time: 06/15/2015 7:40 AM Performed by: Gaston Islam EVETTE Pre-anesthesia Checklist: Patient identified, Suction available, Patient being monitored, Timeout performed and Emergency Drugs available Patient Re-evaluated:Patient Re-evaluated prior to inductionOxygen Delivery Method: Circle system utilized Preoxygenation: Pre-oxygenation with 100% oxygen Intubation Type: IV induction and Inhalational induction with existing ETT Tube size: 6.0 mm Number of attempts: 1 Airway Equipment and Method: Patient positioned with wedge pillow Placement Confirmation: positive ETCO2,  CO2 detector and breath sounds checked- equal and bilateral Secured at: 22 cm Tube secured with: Tape Comments: Placed ETT via trach stoma

## 2015-06-15 NOTE — Op Note (Signed)
06/15/2015  9:05 AM  PATIENT:  Dennis Spencer  70 y.o. male  PRE-OPERATIVE DIAGNOSIS:  GALLSTONES  POST-OPERATIVE DIAGNOSIS:  GALLSTONES  PROCEDURE:  Procedure(s): LAPAROSCOPIC CHOLECYSTECTOMY (N/A)  SURGEON:  Surgeon(s) and Role:    * Ralene Ok, MD - Primary   ANESTHESIA:   local and general  EBL:   5cc  BLOOD ADMINISTERED:none  DRAINS: none   LOCAL MEDICATIONS USED:  BUPIVICAINE   SPECIMEN:  Source of Specimen:  gallbladder  DISPOSITION OF SPECIMEN:  PATHOLOGY  COUNTS:  YES  TOURNIQUET:  * No tourniquets in log *  DICTATION: .Dragon Dictation The patient was taken to the operating and placed in the supine position with bilateral SCDs in place. The patient was prepped and draped in the usual sterile fashion. A time out was called and all facts were verified. A pneumoperitoneum was obtained via A Veress needle technique to a pressure of 28mm of mercury.  A 3mm trochar was then placed in the right upper quadrant under visualization, and there were no injuries to any abdominal organs. A 11 mm port was then placed in the umbilical region after infiltrating with local anesthesia under direct visualization. A second and third epigastric port and right lower quadrant port placement under direct visualization, respectively. There was a large amount of omentum in the  Right upper quadrant adherent to the gallbladder fossa.  This was dissected away. The liver was very fatty and large. The gallbladder was identified and seen to be retracted.  It was grasped and retracted, the peritoneum was then sharply dissected from the gallbladder and this dissection was carried down to Calot's triangle. The gallbladder was identified and stripped away circumferentially and seen going into the gallbladder 360, the critical angle was obtained.  2 clips were placed proximally one distally and the cystic duct transected. The cystic artery was identified and 2 clips placed proximally and one  distally and transected. We then proceeded to remove the gallbladder off the hepatic fossa with Bovie cautery. A retrieval bag was then placed in the abdomen and gallbladder placed in the bag. The hepatic fossa was then reexamined and hemostasis was achieved with Bovie cautery and was excellent at the end of the case. The subhepatic fossa and perihepatic fossa was then irrigated until the effluent was clear.  The gallbladder and bag were removed from the abdominal cavity. The 11 mm trocar fascia was reapproximated with the Endo Close #1 Vicryl. The pneumoperitoneum was evacuated and all trochars removed under direct visulalization. The skin was then closed with 4-0 Monocryl and the skin dressed with Steri-Strips, gauze, and tape. The patient was awaken from general anesthesia and taken to the recovery room in stable condition.   PLAN OF CARE: Discharge to home after PACU  PATIENT DISPOSITION:  PACU - hemodynamically stable.   Delay start of Pharmacological VTE agent (>24hrs) due to surgical blood loss or risk of bleeding: not applicable

## 2015-06-15 NOTE — Progress Notes (Signed)
Patient has a tie to secure his tracheostomy tube in place missing. Resp therapy here to secure his trach in place

## 2015-06-15 NOTE — Discharge Instructions (Signed)
[facility] ______[facility] °LAPAROSCOPIC SURGERY: POST OP INSTRUCTIONS °Always review your discharge instruction sheet given to you by the facility where your surgery was performed. °IF YOU HAVE DISABILITY OR FAMILY LEAVE FORMS, YOU MUST BRING THEM TO THE OFFICE FOR PROCESSING.   °DO NOT GIVE THEM TO YOUR DOCTOR. ° °1. A prescription for pain medication may be given to you upon discharge.  Take your pain medication as prescribed, if needed.  If narcotic pain medicine is not needed, then you may take acetaminophen (Tylenol) or ibuprofen (Advil) as needed. °2. Take your usually prescribed medications unless otherwise directed. °3. If you need a refill on your pain medication, please contact your pharmacy.  They will contact our office to request authorization. Prescriptions will not be filled after 5pm or on week-ends. °4. You should follow a light diet the first few days after arrival home, such as soup and crackers, etc.  Be sure to include lots of fluids daily. °5. Most patients will experience some swelling and bruising in the area of the incisions.  Ice packs will help.  Swelling and bruising can take several days to resolve.  °6. It is common to experience some constipation if taking pain medication after surgery.  Increasing fluid intake and taking a stool softener (such as Colace) will usually help or prevent this problem from occurring.  A mild laxative (Milk of Magnesia or Miralax) should be taken according to package instructions if there are no bowel movements after 48 hours. °7. Unless discharge instructions indicate otherwise, you may remove your bandages 24-48 hours after surgery, and you may shower at that time.  You may have steri-strips (small skin tapes) in place directly over the incision.  These strips should be left on the skin for 7-10 days.  If your surgeon used skin glue on the incision, you may shower in 24 hours.  The glue will flake off over the next 2-3 weeks.  Any sutures or  staples will be removed at the office during your follow-up visit. °8. ACTIVITIES:  You may resume regular (light) daily activities beginning the next day--such as daily self-care, walking, climbing stairs--gradually increasing activities as tolerated.  You may have sexual intercourse when it is comfortable.  Refrain from any heavy lifting or straining until approved by your doctor. °a. You may drive when you are no longer taking prescription pain medication, you can comfortably wear a seatbelt, and you can safely maneuver your car and apply brakes. °b. RETURN TO WORK:  __________________________________________________________ °9. You should see your doctor in the office for a follow-up appointment approximately 2-3 weeks after your surgery.  Make sure that you call for this appointment within a day or two after you arrive home to insure a convenient appointment time. °10. OTHER INSTRUCTIONS: __________________________________________________________________________________________________________________________ __________________________________________________________________________________________________________________________ °WHEN TO CALL YOUR DOCTOR: °1. Fever over 101.0 °2. Inability to urinate °3. Continued bleeding from incision. °4. Increased pain, redness, or drainage from the incision. °5. Increasing abdominal pain ° °The clinic staff is available to answer your questions during regular business hours.  Please don’t hesitate to call and ask to speak to one of the nurses for clinical concerns.  If you have a medical emergency, go to the nearest emergency room or call 911.  A surgeon from [facility] is always on call at the hospital. °[address], [address], [place], [place]  [address] ? [address] [address], [place], [place]   [address] °[phone] ? [phone] ? FAX [phone] °Web site:   [email or web address] °

## 2015-06-15 NOTE — Progress Notes (Signed)
SS notified x2 for pt transfer/no rooms available.

## 2015-06-18 ENCOUNTER — Ambulatory Visit: Payer: Commercial Managed Care - HMO | Admitting: Family Medicine

## 2015-07-02 ENCOUNTER — Other Ambulatory Visit: Payer: Self-pay | Admitting: Family Medicine

## 2015-07-02 MED ORDER — AMLODIPINE BESYLATE 5 MG PO TABS: 5.0000 mg | ORAL_TABLET | Freq: Every morning | ORAL | Status: DC

## 2015-07-02 NOTE — Telephone Encounter (Signed)
done

## 2015-07-05 ENCOUNTER — Other Ambulatory Visit: Payer: Self-pay | Admitting: Family Medicine

## 2015-07-10 ENCOUNTER — Encounter: Payer: Self-pay | Admitting: Family Medicine

## 2015-07-10 ENCOUNTER — Ambulatory Visit (INDEPENDENT_AMBULATORY_CARE_PROVIDER_SITE_OTHER): Payer: Commercial Managed Care - HMO | Admitting: Family Medicine

## 2015-07-10 VITALS — BP 124/62 | HR 65 | Temp 97.2°F | Ht 68.0 in | Wt 300.0 lb

## 2015-07-10 DIAGNOSIS — I10 Essential (primary) hypertension: Secondary | ICD-10-CM | POA: Diagnosis not present

## 2015-07-10 DIAGNOSIS — N183 Chronic kidney disease, stage 3 unspecified: Secondary | ICD-10-CM

## 2015-07-10 DIAGNOSIS — E7800 Pure hypercholesterolemia, unspecified: Secondary | ICD-10-CM

## 2015-07-10 DIAGNOSIS — K219 Gastro-esophageal reflux disease without esophagitis: Secondary | ICD-10-CM

## 2015-07-10 DIAGNOSIS — R739 Hyperglycemia, unspecified: Secondary | ICD-10-CM | POA: Diagnosis not present

## 2015-07-10 LAB — POCT URINALYSIS DIPSTICK
Bilirubin, UA: NEGATIVE
Glucose, UA: NEGATIVE
Ketones, UA: NEGATIVE
Leukocytes, UA: NEGATIVE
NITRITE UA: NEGATIVE
PROTEIN UA: NEGATIVE
SPEC GRAV UA: 1.025
UROBILINOGEN UA: NEGATIVE
pH, UA: 5

## 2015-07-10 LAB — POCT GLYCOSYLATED HEMOGLOBIN (HGB A1C): Hemoglobin A1C: 5.9

## 2015-07-10 NOTE — Progress Notes (Signed)
 Subjective:  Patient ID: Dennis Spencer, male    DOB: 10/24/1945  Age: 69 y.o. MRN: [id number]  CC: Hyperlipidemia; Gastroesophageal Reflux; and Shoulder Pain   HPI Dennis Spencer presents for follow-up of elevated cholesterol. Doing well without complaints on current medication. Denies side effects of statin including myalgia and arthralgia and nausea. Also in today for liver function testing. Currently no chest pain, shortness of breath or other cardiovascular related symptoms noted.  Patient in for follow-up of GERD. Currently asymptomatic taking  PPI daily. There is no chest pain or heartburn. No hematemesis and no melena. No dysphagia or choking. Onset is remote. Progression is stable. Complicating factors, none.  Patient continues to use gabapentin for left shoulder pain. Had bursitis injections twice a couple of years ago. Since then the gabapentin seems to be utilizing the pain. He has reasonably good use of it. 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   follow-up of hypertension. Patient has no history of headache chest pain or shortness of breath or recent cough. Patient also denies symptoms of TIA such as numbness weakness lateralizing. Patient checks  blood pressure at home and has not had any elevated readings recently. Patient denies side effects from his medication. States taking it regularly. He has been noted to have some chronic renal insufficiency likely related to his history of hypertension.   Patient was noted to have an elevated blood sugar at one point and is due for follow-up. He denies polyuria and polydipsia and nausea.   History Dennis Spencer has a past medical history of Kidney stones; Hypertension; Hypercholesteremia; Osteomyelitis (HCC); History of kidney stones (08-04-13); GERD (gastroesophageal reflux disease); CKD (chronic kidney disease), stage III (11/10/2013); Severe obesity (BMI  >= 40) (HCC) (11/12/2013); Sleep apnea; Arthritis; Gallstones; and Presence of tracheostomy (HCC) (since 1981).   He has past surgical history that includes Lithotripsy; Lithotripsy; Percutaneous nephrostolithotomy; Cystoscopy with retrograde pyelogram, ureteroscopy and stent placement (Bilateral, 06/18/2013); Holmium laser application (Bilateral, 06/18/2013); roux en y; Tracheostomy; Tonsillectomy; Nephrolithotomy (Left, 08/08/2013); Cystoscopy with retrograde pyelogram, ureteroscopy and stent placement (Right, 08/08/2013); Nephrolithotomy (Left, 08/10/2013); Cystoscopy with retrograde pyelogram, ureteroscopy and stent placement (Right, 08/10/2013); Holmium laser application (Left, 08/10/2013); and Cholecystectomy (N/A, 06/15/2015).   His family history includes Cancer in his mother; Diabetes in his father; Heart failure in his father; Hypertension in his father.He reports that he quit smoking about 47 years ago. His smoking use included Cigarettes. He has never used smokeless tobacco. He reports that he does not drink alcohol or use illicit drugs.  Current Outpatient Prescriptions on File Prior to Visit  Medication Sig Dispense Refill  . amLODipine (NORVASC) 5 MG tablet Take 1 tablet (5 mg total) by mouth every morning. 90 tablet 0  . aspirin EC 81 MG tablet Take 81 mg by mouth every morning.     . atorvastatin (LIPITOR) 80 MG tablet TAKE 1 TABLET (80 MG TOTAL) BY MOUTH DAILY AT 6 PM. 90 tablet 0  . calcium citrate-vitamin D (CITRACAL+D) 315-200 MG-UNIT per tablet Take 2 tablets by mouth 3 (three) times daily.     . Cholecalciferol (VITAMIN D3) 2000 UNITS capsule Take 4,000 Units by mouth daily.    . gabapentin (NEURONTIN) 600 MG tablet Take 0.5 tablets (300 mg total) by mouth daily. (Patient taking differently: Take 300 mg by mouth every evening. ) 45 tablet 2  . Multiple Vitamin (MULTIVITAMIN WITH MINERALS) TABS tablet Take 1 tablet by   mouth daily.    . naproxen sodium (ANAPROX) 220 MG tablet Take 220-440 mg by  mouth 2 (two) times daily as needed (pain.).    . omeprazole (PRILOSEC) 20 MG capsule Take 1 capsule (20 mg total) by mouth daily. 90 capsule 3  . vitamin B-12 (CYANOCOBALAMIN) 1000 MCG tablet Take 1,000 mcg by mouth daily.    . vitamin C (ASCORBIC ACID) 500 MG tablet Take 500 mg by mouth 2 (two) times daily.    . oxyCODONE-acetaminophen (ROXICET) 5-325 MG tablet Take 1-2 tablets by mouth every 4 (four) hours as needed. (Patient not taking: Reported on 07/10/2015) 30 tablet 0   No current facility-administered medications on file prior to visit.    ROS Review of Systems  Constitutional: Negative for fever, chills, diaphoresis and unexpected weight change.  HENT: Negative for congestion, hearing loss, rhinorrhea and sore throat.   Eyes: Negative for visual disturbance.  Respiratory: Negative for cough and shortness of breath.   Cardiovascular: Negative for chest pain.  Gastrointestinal: Negative for abdominal pain, diarrhea and constipation.  Genitourinary: Negative for dysuria and flank pain.  Musculoskeletal: Negative for joint swelling and arthralgias.  Skin: Negative for rash.  Neurological: Negative for dizziness and headaches.  Psychiatric/Behavioral: Negative for sleep disturbance and dysphoric mood.    Objective:  BP 124/62 mmHg  Pulse 65  Temp(Src) 97.2 F (36.2 C) (Oral)  Ht 5' 8" (1.727 m)  Wt 300 lb (136.079 kg)  BMI 45.63 kg/m2  SpO2 95%  BP Readings from Last 3 Encounters:  07/10/15 124/62  06/15/15 144/80  06/13/15 126/56    Wt Readings from Last 3 Encounters:  07/10/15 300 lb (136.079 kg)  06/15/15 292 lb (132.45 kg)  06/13/15 292 lb (132.45 kg)     Physical Exam  Constitutional: He is oriented to person, place, and time. He appears well-developed and well-nourished. No distress.  HENT:  Head: Normocephalic and atraumatic.  Right Ear: External ear normal.  Left Ear: External ear normal.  Nose: Nose normal.  Mouth/Throat: Oropharynx is clear and  moist.  Eyes: Conjunctivae and EOM are normal. Pupils are equal, round, and reactive to light.  Neck: Normal range of motion. Neck supple. No thyromegaly present.  Cardiovascular: Normal rate, regular rhythm and normal heart sounds.   No murmur heard. Pulmonary/Chest: Effort normal and breath sounds normal. No respiratory distress. He has no wheezes. He has no rales.  Abdominal: Soft. Bowel sounds are normal. He exhibits no distension. There is no tenderness.  Lymphadenopathy:    He has no cervical adenopathy.  Neurological: He is alert and oriented to person, place, and time. He has normal reflexes.  Skin: Skin is warm and dry.  Psychiatric: He has a normal mood and affect. His behavior is normal. Judgment and thought content normal.    Lab Results  Component Value Date   HGBA1C 5.7* 11/10/2013   HGBA1C 5.0% 09/12/2013   HGBA1C 5.9* 06/19/2013    Lab Results  Component Value Date   WBC 6.7 06/13/2015   HGB 15.2 06/13/2015   HCT 44.8 06/13/2015   PLT 211 06/13/2015   GLUCOSE 103* 06/13/2015   CHOL 143 12/25/2014   TRIG 122 12/25/2014   HDL 40 12/25/2014   LDLDIRECT 82 11/09/2012   LDLCALC 79 12/25/2014   ALT 16 03/12/2015   AST 23 03/12/2015   NA 138 06/13/2015   K 4.6 06/13/2015   CL 105 06/13/2015   CREATININE 1.20 06/13/2015   BUN 22* 06/13/2015   CO2 25 06/13/2015     TSH 2.410 11/10/2013   HGBA1C 5.7* 11/10/2013    No results found.  Assessment & Plan:   Dennis Spencer was seen today for hyperlipidemia, gastroesophageal reflux and shoulder pain.  Diagnoses and all orders for this visit:  CKD (chronic kidney disease), stage III -     CMP14+EGFR -     POCT urinalysis dipstick -     CBC with Differential/Platelet  Gastroesophageal reflux disease without esophagitis -     CMP14+EGFR -     CBC with Differential/Platelet  Hypercholesteremia -     CMP14+EGFR -     Lipid panel  Hyperglycemia -     POCT glycosylated hemoglobin (Hb A1C) -      CMP14+EGFR  Essential hypertension -     CMP14+EGFR -     POCT urinalysis dipstick  Morbid obesity, unspecified obesity type (HCC) -     CMP14+EGFR   I am having Dennis Spencer maintain his vitamin C, vitamin B-12, aspirin EC, Vitamin D3, multivitamin with minerals, calcium citrate-vitamin D, omeprazole, gabapentin, naproxen sodium, oxyCODONE-acetaminophen, amLODipine, and atorvastatin.  No orders of the defined types were placed in this encounter.     Follow-up: Return in about 6 months (around 01/07/2016) for CPE.  Warren Stacks, M.D.  

## 2015-07-11 LAB — CMP14+EGFR
ALBUMIN: 4.1 g/dL (ref 3.6–4.8)
ALK PHOS: 69 IU/L (ref 39–117)
ALT: 16 IU/L (ref 0–44)
AST: 22 IU/L (ref 0–40)
Albumin/Globulin Ratio: 2 (ref 1.1–2.5)
BUN / CREAT RATIO: 14 (ref 10–22)
BUN: 17 mg/dL (ref 8–27)
Bilirubin Total: 0.7 mg/dL (ref 0.0–1.2)
CO2: 23 mmol/L (ref 18–29)
CREATININE: 1.25 mg/dL (ref 0.76–1.27)
Calcium: 9 mg/dL (ref 8.6–10.2)
Chloride: 104 mmol/L (ref 96–106)
GFR, EST AFRICAN AMERICAN: 68 mL/min/{1.73_m2} (ref >59)
GFR, EST NON AFRICAN AMERICAN: 59 mL/min/{1.73_m2} — AB (ref >59)
GLOBULIN, TOTAL: 2.1 g/dL (ref 1.5–4.5)
GLUCOSE: 109 mg/dL — AB (ref 65–99)
Potassium: 4.8 mmol/L (ref 3.5–5.2)
SODIUM: 143 mmol/L (ref 134–144)
TOTAL PROTEIN: 6.2 g/dL (ref 6.0–8.5)

## 2015-07-11 LAB — CBC WITH DIFFERENTIAL/PLATELET
BASOS ABS: 0.1 10*3/uL (ref 0.0–0.2)
BASOS: 1 %
EOS (ABSOLUTE): 0.6 10*3/uL — ABNORMAL HIGH (ref 0.0–0.4)
Eos: 12 %
HEMATOCRIT: 44.4 % (ref 37.5–51.0)
Hemoglobin: 15.2 g/dL (ref 12.6–17.7)
IMMATURE GRANULOCYTES: 0 %
Immature Grans (Abs): 0 10*3/uL (ref 0.0–0.1)
LYMPHS: 37 %
Lymphocytes Absolute: 1.7 10*3/uL (ref 0.7–3.1)
MCH: 31.6 pg (ref 26.6–33.0)
MCHC: 34.2 g/dL (ref 31.5–35.7)
MCV: 92 fL (ref 79–97)
MONOS ABS: 0.5 10*3/uL (ref 0.1–0.9)
Monocytes: 11 %
NEUTROS PCT: 39 %
Neutrophils Absolute: 1.9 10*3/uL (ref 1.4–7.0)
Platelets: 219 10*3/uL (ref 150–379)
RBC: 4.81 x10E6/uL (ref 4.14–5.80)
RDW: 13.2 % (ref 12.3–15.4)
WBC: 4.7 10*3/uL (ref 3.4–10.8)

## 2015-07-11 LAB — LIPID PANEL
CHOL/HDL RATIO: 3.1 ratio (ref 0.0–5.0)
Cholesterol, Total: 162 mg/dL (ref 100–199)
HDL: 52 mg/dL (ref >39)
LDL CALC: 97 mg/dL (ref 0–99)
Triglycerides: 67 mg/dL (ref 0–149)
VLDL CHOLESTEROL CAL: 13 mg/dL (ref 5–40)

## 2015-07-11 NOTE — Progress Notes (Signed)
Patient aware.

## 2015-09-19 ENCOUNTER — Other Ambulatory Visit: Payer: Self-pay | Admitting: Family Medicine

## 2015-09-28 ENCOUNTER — Other Ambulatory Visit: Payer: Self-pay | Admitting: Family Medicine

## 2015-10-05 ENCOUNTER — Other Ambulatory Visit: Payer: Self-pay | Admitting: Family Medicine

## 2015-11-06 ENCOUNTER — Telehealth: Payer: Self-pay | Admitting: Pharmacist

## 2015-12-10 ENCOUNTER — Encounter: Payer: Self-pay | Admitting: Family Medicine

## 2015-12-10 ENCOUNTER — Ambulatory Visit (INDEPENDENT_AMBULATORY_CARE_PROVIDER_SITE_OTHER): Payer: Commercial Managed Care - HMO | Admitting: Family Medicine

## 2015-12-10 VITALS — BP 123/62 | HR 75 | Temp 97.3°F | Ht 68.0 in | Wt 301.4 lb

## 2015-12-10 DIAGNOSIS — J40 Bronchitis, not specified as acute or chronic: Secondary | ICD-10-CM

## 2015-12-10 DIAGNOSIS — Z930 Tracheostomy status: Secondary | ICD-10-CM | POA: Diagnosis not present

## 2015-12-10 MED ORDER — PSEUDOEPHEDRINE-GUAIFENESIN ER 120-1200 MG PO TB12: 1.0000 | ORAL_TABLET | Freq: Two times a day (BID) | ORAL | 0 refills | Status: DC

## 2015-12-10 MED ORDER — AMOXICILLIN-POT CLAVULANATE 875-125 MG PO TABS: 1.0000 | ORAL_TABLET | Freq: Two times a day (BID) | ORAL | 0 refills | Status: DC

## 2015-12-10 MED ORDER — OMEPRAZOLE 20 MG PO CPDR: 20.0000 mg | DELAYED_RELEASE_CAPSULE | Freq: Every day | ORAL | 3 refills | Status: DC

## 2015-12-10 MED ORDER — BENZONATATE 200 MG PO CAPS: 200.0000 mg | ORAL_CAPSULE | Freq: Three times a day (TID) | ORAL | 0 refills | Status: DC | PRN

## 2015-12-10 NOTE — Progress Notes (Signed)
Subjective:  Patient ID: Dennis Spencer, male    DOB: [birth year]  Age: 70 y.o. MRN: [id number]  CC: URI (head and chest congestion, cough, started on Weds)   HPI Dennis Spencer presents for Patient presents with upper respiratory congestion. Sputum that is frequently purulent. There is no sore throat. Patient reports coughing frequently . There is no fever no chills no sweats. The patient denies being short of breath. Onset was 5 days ago. 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   He has a past surgical history that includes Lithotripsy; Lithotripsy; Percutaneous nephrostolithotomy; Cystoscopy with retrograde pyelogram, ureteroscopy and stent placement (Bilateral, 06/18/2013); Holmium laser application (Bilateral, 06/18/2013); roux en y; Tracheostomy; Tonsillectomy; Nephrolithotomy (Left, 08/08/2013); Cystoscopy with retrograde pyelogram, ureteroscopy and stent placement (Right, 08/08/2013); Nephrolithotomy (Left, 08/10/2013); Cystoscopy with retrograde pyelogram, ureteroscopy and stent placement (Right, 08/10/2013); Holmium laser application (Left, [id number]); and Cholecystectomy (N/A, 06/15/2015).   His family history includes Cancer in his mother; Diabetes in his father; Heart failure in his father; Hypertension in his father.He reports that he quit smoking about 47 years ago. His smoking use included Cigarettes. He has never used smokeless tobacco. He reports that he does not drink alcohol or use drugs.  Current Outpatient Prescriptions on File Prior to Visit  Medication Sig Dispense Refill  . amLODipine  (NORVASC) 5 MG tablet TAKE 1 TABLET (5 MG TOTAL) BY MOUTH EVERY MORNING. 90 tablet 0  . aspirin EC 81 MG tablet Take 81 mg by mouth every morning.     Marland Kitchen atorvastatin (LIPITOR) 80 MG tablet TAKE 1 TABLET (80 MG TOTAL) BY MOUTH DAILY AT 6 PM. 90 tablet 0  . calcium citrate-vitamin D (CITRACAL+D) 315-200 MG-UNIT per tablet Take 2 tablets by mouth 3 (three) times daily.     . Cholecalciferol (VITAMIN D3) 2000 UNITS capsule Take 4,000 Units by mouth daily.    Marland Kitchen gabapentin (NEURONTIN) 600 MG tablet TAKE 0.5 TABLETS (300 MG TOTAL) BY MOUTH DAILY. 45 tablet 2  . Multiple Vitamin (MULTIVITAMIN WITH MINERALS) TABS tablet Take 1 tablet by mouth daily.    . vitamin B-12 (CYANOCOBALAMIN) 1000 MCG tablet Take 1,000 mcg by mouth daily.    . vitamin C (ASCORBIC ACID) 500 MG tablet Take 500 mg by mouth 2 (two) times daily.     No current facility-administered medications on file prior to visit.     ROS Review of Systems  Constitutional: Negative for activity change, appetite change, chills and fever.  HENT: Positive for congestion and rhinorrhea. Negative for ear discharge, ear pain, hearing loss, nosebleeds, postnasal drip, sinus pressure, sneezing and trouble swallowing.   Respiratory: Positive for cough and chest tightness. Negative for shortness of breath, wheezing and stridor.   Cardiovascular: Negative for chest pain and palpitations.  Skin: Negative for rash.    Objective:  BP 123/62 (BP Location: Left Arm, Patient Position: Sitting, Cuff Size: Large)   Pulse 75   Temp 97.3 F (36.3 C) (Oral)   Ht 5\' 8"  (1.727 m)   Wt (!) 301 lb 6.4 oz (136.7 kg)   SpO2 96%   BMI 45.83 kg/m   Physical Exam  Constitutional: He appears well-developed and well-nourished.  HENT:  Head: Normocephalic and atraumatic.  Right Ear: Tympanic membrane and external ear normal. No decreased hearing is noted.  Left Ear: Tympanic membrane and external ear normal. No decreased hearing is noted.  Nose: Mucosal edema  present. Right sinus exhibits no frontal sinus tenderness. Left sinus exhibits no frontal sinus tenderness.  Mouth/Throat: No oropharyngeal exudate or posterior oropharyngeal erythema.  Neck: No Brudzinski's sign noted.  Pulmonary/Chest: Breath sounds normal. No respiratory distress.  Lymphadenopathy:       Head (right side): No preauricular adenopathy present.       Head (left side): No preauricular adenopathy present.       Right cervical: No superficial cervical adenopathy present.      Left cervical: No superficial cervical adenopathy present.    Assessment & Plan:   Dennis Spencer was seen today for uri.  Diagnoses and all orders for this visit:  Bronchitis  Tracheostomy dependence ([place])  Other orders -     omeprazole (PRILOSEC) 20 MG capsule; Take 1 capsule (20 mg total) by mouth daily. -     amoxicillin-clavulanate (AUGMENTIN) 875-125 MG tablet; Take 1 tablet by mouth 2 (two) times daily. Take all of this medication -     benzonatate (TESSALON) 200 MG capsule; Take 1 capsule (200 mg total) by mouth 3 (three) times daily as needed for cough. -     Pseudoephedrine-Guaifenesin 618 [id number] MG TB12; Take 1 tablet by mouth 2 (two) times daily. For congestion   I have discontinued Mr. Alverson's naproxen sodium and oxyCODONE-acetaminophen. I am also having him start on amoxicillin-clavulanate, benzonatate, and Pseudoephedrine-Guaifenesin. Additionally, I am having him maintain his vitamin C, vitamin B-12, aspirin EC, Vitamin D3, multivitamin with minerals, calcium citrate-vitamin D, gabapentin, amLODipine, atorvastatin, and omeprazole.  Meds ordered this encounter  Medications  . omeprazole (PRILOSEC) 20 MG capsule    Sig: Take 1 capsule (20 mg total) by mouth daily.    Dispense:  90 capsule    Refill:  3  . amoxicillin-clavulanate (AUGMENTIN) 875-125 MG tablet    Sig: Take 1 tablet by mouth 2 (two) times daily. Take all of this medication    Dispense:  20 tablet    Refill:  0  .  benzonatate (TESSALON) 200 MG capsule    Sig: Take 1 capsule (200 mg total) by mouth 3 (three) times daily as needed for cough.    Dispense:  20 capsule    Refill:  0  . Pseudoephedrine-Guaifenesin 618 [id number] 1634 MG TB12    Sig: Take 1 tablet by mouth 2 (two) times daily. For congestion    Dispense:  20 each    Refill:  0     Follow-up: Return if symptoms worsen or fail to improve.  Claretta Fraise, M.D.

## 2015-12-14 ENCOUNTER — Telehealth: Payer: Self-pay | Admitting: Family Medicine

## 2015-12-14 MED ORDER — LEVOFLOXACIN 500 MG PO TABS: 500.0000 mg | ORAL_TABLET | Freq: Every day | ORAL | 0 refills | Status: DC

## 2015-12-14 NOTE — Telephone Encounter (Signed)
Pt aware.

## 2015-12-14 NOTE — Telephone Encounter (Signed)
Pt states the Augmentin is causing him to have urinary problems. After the 4th dose he noticed his flow was almost completely diminished and he wasn't able to urinate and wanted to know if you could give him another antibiotic. Please advise.

## 2015-12-14 NOTE — Telephone Encounter (Signed)
The requested med has been sent to the pharmacy.  Please let the patient know. Thanks, WS 

## 2015-12-30 ENCOUNTER — Other Ambulatory Visit: Payer: Self-pay | Admitting: Family Medicine

## 2016-01-02 ENCOUNTER — Other Ambulatory Visit: Payer: Commercial Managed Care - HMO

## 2016-01-02 DIAGNOSIS — I10 Essential (primary) hypertension: Secondary | ICD-10-CM | POA: Diagnosis not present

## 2016-01-02 DIAGNOSIS — E7800 Pure hypercholesterolemia, unspecified: Secondary | ICD-10-CM | POA: Diagnosis not present

## 2016-01-02 DIAGNOSIS — N183 Chronic kidney disease, stage 3 unspecified: Secondary | ICD-10-CM

## 2016-01-03 LAB — CBC WITH DIFFERENTIAL/PLATELET
BASOS ABS: 0 10*3/uL (ref 0.0–0.2)
BASOS: 1 %
EOS (ABSOLUTE): 0.3 10*3/uL (ref 0.0–0.4)
EOS: 6 %
HEMATOCRIT: 44.6 % (ref 37.5–51.0)
HEMOGLOBIN: 15.1 g/dL (ref 12.6–17.7)
IMMATURE GRANS (ABS): 0 10*3/uL (ref 0.0–0.1)
Immature Granulocytes: 0 %
LYMPHS ABS: 2 10*3/uL (ref 0.7–3.1)
LYMPHS: 38 %
MCH: 31.3 pg (ref 26.6–33.0)
MCHC: 33.9 g/dL (ref 31.5–35.7)
MCV: 93 fL (ref 79–97)
MONOCYTES: 10 %
Monocytes Absolute: 0.5 10*3/uL (ref 0.1–0.9)
NEUTROS ABS: 2.4 10*3/uL (ref 1.4–7.0)
Neutrophils: 45 %
Platelets: 219 10*3/uL (ref 150–379)
RBC: 4.82 x10E6/uL (ref 4.14–5.80)
RDW: 14 % (ref 12.3–15.4)
WBC: 5.2 10*3/uL (ref 3.4–10.8)

## 2016-01-03 LAB — CMP14+EGFR
ALBUMIN: 4 g/dL (ref 3.6–4.8)
ALK PHOS: 67 IU/L (ref 39–117)
ALT: 13 IU/L (ref 0–44)
AST: 20 IU/L (ref 0–40)
Albumin/Globulin Ratio: 1.8 (ref 1.2–2.2)
BILIRUBIN TOTAL: 0.5 mg/dL (ref 0.0–1.2)
BUN/Creatinine Ratio: 11 (ref 10–24)
BUN: 13 mg/dL (ref 8–27)
CHLORIDE: 102 mmol/L (ref 96–106)
CO2: 23 mmol/L (ref 18–29)
Calcium: 9.1 mg/dL (ref 8.6–10.2)
Creatinine, Ser: 1.23 mg/dL (ref 0.76–1.27)
GFR calc non Af Amer: 60 mL/min/{1.73_m2} (ref >59)
GFR, EST AFRICAN AMERICAN: 69 mL/min/{1.73_m2} (ref >59)
GLOBULIN, TOTAL: 2.2 g/dL (ref 1.5–4.5)
GLUCOSE: 107 mg/dL — AB (ref 65–99)
Potassium: 4.2 mmol/L (ref 3.5–5.2)
Sodium: 142 mmol/L (ref 134–144)
TOTAL PROTEIN: 6.2 g/dL (ref 6.0–8.5)

## 2016-01-03 LAB — LIPID PANEL
CHOLESTEROL TOTAL: 125 mg/dL (ref 100–199)
Chol/HDL Ratio: 2.7 ratio units (ref 0.0–5.0)
HDL: 47 mg/dL (ref >39)
LDL Calculated: 60 mg/dL (ref 0–99)
Triglycerides: 88 mg/dL (ref 0–149)
VLDL CHOLESTEROL CAL: 18 mg/dL (ref 5–40)

## 2016-01-07 ENCOUNTER — Other Ambulatory Visit: Payer: Self-pay | Admitting: Family Medicine

## 2016-01-08 ENCOUNTER — Ambulatory Visit (INDEPENDENT_AMBULATORY_CARE_PROVIDER_SITE_OTHER): Payer: Commercial Managed Care - HMO | Admitting: Family Medicine

## 2016-01-08 ENCOUNTER — Encounter: Payer: Self-pay | Admitting: Family Medicine

## 2016-01-08 VITALS — BP 130/70 | HR 71 | Temp 97.2°F | Ht 68.0 in | Wt 298.8 lb

## 2016-01-08 DIAGNOSIS — Z1211 Encounter for screening for malignant neoplasm of colon: Secondary | ICD-10-CM | POA: Diagnosis not present

## 2016-01-08 DIAGNOSIS — K219 Gastro-esophageal reflux disease without esophagitis: Secondary | ICD-10-CM | POA: Diagnosis not present

## 2016-01-08 DIAGNOSIS — N183 Chronic kidney disease, stage 3 unspecified: Secondary | ICD-10-CM

## 2016-01-08 DIAGNOSIS — Z23 Encounter for immunization: Secondary | ICD-10-CM

## 2016-01-08 DIAGNOSIS — E7800 Pure hypercholesterolemia, unspecified: Secondary | ICD-10-CM

## 2016-01-08 DIAGNOSIS — R739 Hyperglycemia, unspecified: Secondary | ICD-10-CM | POA: Diagnosis not present

## 2016-01-08 DIAGNOSIS — I10 Essential (primary) hypertension: Secondary | ICD-10-CM | POA: Diagnosis not present

## 2016-01-08 NOTE — Progress Notes (Signed)
Subjective:  Patient ID: Dennis Spencer, male    DOB: 03-24-1946  Age: 70 y.o. MRN: [id number]  CC: Hypertension (6 mth rck); Hyperlipidemia; and Gastroesophageal Reflux   HPI Dennis Spencer presents for  follow-up of hypertension. Patient has no history of headache chest pain or shortness of breath or recent cough. Patient also denies symptoms of TIA such as numbness weakness lateralizing. Patient checks  blood pressure at home and has not had any elevated readings recently. Patient denies side effects from his medication. States taking it regularly.  Patient also  in for follow-up of elevated cholesterol. Doing well without complaints on current medication. Denies side effects of statin including myalgia and arthralgia and nausea. Also in today for liver function testing. Currently no chest pain, shortness of breath or other cardiovascular related symptoms noted.     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   He has a past surgical history that includes Lithotripsy; Lithotripsy; Percutaneous nephrostolithotomy; Cystoscopy with retrograde pyelogram, ureteroscopy and stent placement (Bilateral, 06/18/2013); Holmium laser application (Bilateral, 06/18/2013); roux en y; Tracheostomy; Tonsillectomy; Nephrolithotomy (Left, 08/08/2013); Cystoscopy with retrograde pyelogram, ureteroscopy and stent placement (Right, 08/08/2013); Nephrolithotomy (Left, 08/10/2013); Cystoscopy with retrograde pyelogram, ureteroscopy and stent placement (Right, 08/10/2013); Holmium laser application (Left, [id number]); and Cholecystectomy (N/A, 06/15/2015).   His family history includes Cancer in his  mother; Diabetes in his father; Heart failure in his father; Hypertension in his father.He reports that he quit smoking about 47 years ago. His smoking use included Cigarettes. He has never used smokeless tobacco. He reports that he does not drink alcohol or use drugs.  Current Outpatient Prescriptions on File Prior to Visit  Medication Sig Dispense Refill  . amLODipine (NORVASC) 5 MG tablet TAKE 1 TABLET (5 MG TOTAL) BY MOUTH EVERY MORNING. 90 tablet 0  . aspirin EC 81 MG tablet Take 81 mg by mouth every morning.     Marland Kitchen atorvastatin (LIPITOR) 80 MG tablet TAKE 1 TABLET (80 MG TOTAL) BY MOUTH DAILY AT 6 PM. 90 tablet 1  . calcium citrate-vitamin D (CITRACAL+D) 315-200 MG-UNIT per tablet Take 2 tablets by mouth 3 (three) times daily.     . Cholecalciferol (VITAMIN D3) 2000 UNITS capsule Take 4,000 Units by mouth daily.    Marland Kitchen gabapentin (NEURONTIN) 600 MG tablet TAKE 0.5 TABLETS (300 MG TOTAL) BY MOUTH DAILY. 45 tablet 2  . Multiple Vitamin (MULTIVITAMIN WITH MINERALS) TABS tablet Take 1 tablet by mouth daily.    Marland Kitchen omeprazole (PRILOSEC) 20 MG capsule Take 1 capsule (20 mg total) by mouth daily. 90 capsule 3  . vitamin B-12 (CYANOCOBALAMIN) 1000 MCG tablet Take 1,000 mcg by mouth daily.    . vitamin C (ASCORBIC ACID) 500 MG tablet Take 500 mg by mouth 2 (two) times daily.     No current facility-administered medications on file prior to visit.     ROS Review of Systems  Constitutional: Negative for chills, diaphoresis, fever and unexpected weight change.  HENT: Negative for congestion, hearing loss, rhinorrhea and sore throat.   Eyes: Negative for visual disturbance.  Respiratory: Negative for cough and shortness of breath.   Cardiovascular: Negative for chest pain.  Gastrointestinal: Negative for abdominal pain, constipation and diarrhea.  Genitourinary: Negative for dysuria and flank  pain.  Musculoskeletal: Negative for arthralgias and joint swelling.  Skin: Negative for rash.    Neurological: Negative for dizziness and headaches.  Psychiatric/Behavioral: Negative for dysphoric mood and sleep disturbance.    Objective:  BP 130/70 (BP Location: Left Arm, Patient Position: Sitting, Cuff Size: Large)   Pulse 71   Temp 97.2 F (36.2 C) (Oral)   Ht 5\' 8"  (1.727 m)   Wt 298 lb 12.8 oz (135.5 kg)   SpO2 98%   BMI 45.43 kg/m   BP Readings from Last 3 Encounters:  01/08/16 130/70  12/10/15 123/62  07/10/15 124/62    Wt Readings from Last 3 Encounters:  01/08/16 298 lb 12.8 oz (135.5 kg)  12/10/15 (!) 301 lb 6.4 oz (136.7 kg)  07/10/15 300 lb (136.1 kg)     Physical Exam  Constitutional: He is oriented to person, place, and time. He appears well-developed and well-nourished. No distress.  HENT:  Head: Normocephalic and atraumatic.  Right Ear: External ear normal.  Left Ear: External ear normal.  Nose: Nose normal.  Mouth/Throat: Oropharynx is clear and moist.  Eyes: Conjunctivae and EOM are normal. Pupils are equal, round, and reactive to light.  Neck: Normal range of motion. Neck supple. No thyromegaly present.  Cardiovascular: Normal rate, regular rhythm and normal heart sounds.   No murmur heard. Pulmonary/Chest: Effort normal and breath sounds normal. No respiratory distress. He has no wheezes. He has no rales.  Abdominal: Soft. Bowel sounds are normal. He exhibits no distension. There is no tenderness.  Lymphadenopathy:    He has no cervical adenopathy.  Neurological: He is alert and oriented to person, place, and time. He has normal reflexes.  Skin: Skin is warm and dry.  Psychiatric: He has a normal mood and affect. His behavior is normal. Judgment and thought content normal.    Lab Results  Component Value Date   HGBA1C 5.9 07/10/2015   HGBA1C 5.7 (H) 11/10/2013   HGBA1C 5.0% 09/12/2013    Lab Results  Component Value Date   WBC 5.2 01/02/2016   HGB 15.2 06/13/2015   HCT 44.6 01/02/2016   PLT 219 01/02/2016   GLUCOSE 107 (H)  01/02/2016   CHOL 125 01/02/2016   TRIG 88 01/02/2016   HDL 47 01/02/2016   LDLDIRECT 82 11/09/2012   LDLCALC 60 01/02/2016   ALT 13 01/02/2016   AST 20 01/02/2016   NA 142 01/02/2016   K 4.2 01/02/2016   CL 102 01/02/2016   CREATININE 1.23 01/02/2016   BUN 13 01/02/2016   CO2 23 01/02/2016   TSH 2.410 11/10/2013   HGBA1C 5.9 07/10/2015    No results found.  Assessment & Plan:   Dennis Spencer was seen today for hypertension, hyperlipidemia and gastroesophageal reflux.  Diagnoses and all orders for this visit:  CKD (chronic kidney disease), stage III -     Ambulatory referral to Urology  Hypercholesteremia  Hyperglycemia  Essential hypertension  Morbid obesity, unspecified obesity type ([place])  Gastroesophageal reflux disease without esophagitis  Special screening for malignant neoplasms, colon -     Ambulatory referral to Gastroenterology   I have discontinued Dennis Spencer's benzonatate, Pseudoephedrine-Guaifenesin, and levofloxacin. I am also having him maintain his vitamin C, vitamin B-12, aspirin EC, Vitamin D3, multivitamin with minerals, calcium citrate-vitamin D, gabapentin, omeprazole, amLODipine, and atorvastatin.  No orders of the defined types were placed in this encounter.    Follow-up: Return in about 6 months (around 07/09/2016) for Wellness.  Claretta Fraise, M.D.

## 2016-01-08 NOTE — Addendum Note (Signed)
Addended by: Marin Olp on: 01/08/2016 11:43 AM   Modules accepted: Orders

## 2016-01-09 ENCOUNTER — Encounter: Payer: Self-pay | Admitting: Gastroenterology

## 2016-03-14 ENCOUNTER — Ambulatory Visit (AMBULATORY_SURGERY_CENTER): Payer: Self-pay | Admitting: *Deleted

## 2016-03-14 VITALS — Ht 70.0 in | Wt 306.8 lb

## 2016-03-14 DIAGNOSIS — Z1211 Encounter for screening for malignant neoplasm of colon: Secondary | ICD-10-CM

## 2016-03-14 MED ORDER — NA SULFATE-K SULFATE-MG SULF 17.5-3.13-1.6 GM/177ML PO SOLN: ORAL | 0 refills | Status: DC

## 2016-03-14 NOTE — Progress Notes (Signed)
Pt has a trach for sleep apnea.  He has been intubated with it without difficulty.  Per B Silvio Clayman CRNA ok for pt to be done at the [facility]  No egg or soy allergy  No home oxygen used  No diet medications taken

## 2016-03-18 ENCOUNTER — Encounter: Payer: Self-pay | Admitting: Gastroenterology

## 2016-03-25 DIAGNOSIS — Z125 Encounter for screening for malignant neoplasm of prostate: Secondary | ICD-10-CM | POA: Diagnosis not present

## 2016-03-28 ENCOUNTER — Ambulatory Visit (AMBULATORY_SURGERY_CENTER): Payer: Commercial Managed Care - HMO | Admitting: Gastroenterology

## 2016-03-28 ENCOUNTER — Encounter: Payer: Self-pay | Admitting: Gastroenterology

## 2016-03-28 VITALS — BP 136/77 | HR 56 | Temp 96.4°F | Resp 15 | Ht 70.0 in | Wt 306.8 lb

## 2016-03-28 DIAGNOSIS — G4733 Obstructive sleep apnea (adult) (pediatric): Secondary | ICD-10-CM | POA: Diagnosis not present

## 2016-03-28 DIAGNOSIS — K5730 Diverticulosis of large intestine without perforation or abscess without bleeding: Secondary | ICD-10-CM | POA: Diagnosis not present

## 2016-03-28 DIAGNOSIS — Z1212 Encounter for screening for malignant neoplasm of rectum: Secondary | ICD-10-CM

## 2016-03-28 DIAGNOSIS — Z1211 Encounter for screening for malignant neoplasm of colon: Secondary | ICD-10-CM | POA: Diagnosis present

## 2016-03-28 DIAGNOSIS — I10 Essential (primary) hypertension: Secondary | ICD-10-CM | POA: Diagnosis not present

## 2016-03-28 DIAGNOSIS — D120 Benign neoplasm of cecum: Secondary | ICD-10-CM | POA: Diagnosis not present

## 2016-03-28 MED ORDER — SODIUM CHLORIDE 0.9 % IV SOLN: 500.0000 mL | INTRAVENOUS | Status: DC

## 2016-03-28 NOTE — Progress Notes (Signed)
To recovery, report to Hod[place], RN, VSS 

## 2016-03-28 NOTE — Patient Instructions (Signed)
YOU HAD AN ENDOSCOPIC PROCEDURE TODAY AT [facility]:   Refer to the procedure report that was given to you for any specific questions about what was found during the examination.  If the procedure report does not answer your questions, please call your gastroenterologist to clarify.  If you requested that your care partner not be given the details of your procedure findings, then the procedure report has been included in a sealed envelope for you to review at your convenience later.  YOU SHOULD EXPECT: Some feelings of bloating in the abdomen. Passage of more gas than usual.  Walking can help get rid of the air that was put into your GI tract during the procedure and reduce the bloating. If you had a lower endoscopy (such as a colonoscopy or flexible sigmoidoscopy) you may notice spotting of blood in your stool or on the toilet paper. If you underwent a bowel prep for your procedure, you may not have a normal bowel movement for a few days.  Please Note:  You might notice some irritation and congestion in your nose or some drainage.  This is from the oxygen used during your procedure.  There is no need for concern and it should clear up in a day or so.  SYMPTOMS TO REPORT IMMEDIATELY:   Following lower endoscopy (colonoscopy or flexible sigmoidoscopy):  Excessive amounts of blood in the stool  Significant tenderness or worsening of abdominal pains  Swelling of the abdomen that is new, acute  Fever of 100F or higher   For urgent or emergent issues, a gastroenterologist can be reached at any hour by calling [phone].   DIET:  We do recommend a small meal at first, but then you may proceed to your regular diet.  Drink plenty of fluids but you should avoid alcoholic beverages for 24 hours. Increase the fiber in your diet, and drink plenty of water.  ACTIVITY:  You should plan to take it easy for the rest of today and you should NOT DRIVE or use heavy machinery until tomorrow  (because of the sedation medicines used during the test).    FOLLOW UP: Our staff will call the number listed on your records the next business day following your procedure to check on you and address any questions or concerns that you may have regarding the information given to you following your procedure. If we do not reach you, we will leave a message.  However, if you are feeling well and you are not experiencing any problems, there is no need to return our call.  We will assume that you have returned to your regular daily activities without incident.  If any biopsies were taken you will be contacted by phone or by letter within the next 1-3 weeks.  Please call us at [phone] if you have not heard about the biopsies in 3 weeks.    SIGNATURES/CONFIDENTIALITY: You and/or your care partner have signed paperwork which will be entered into your electronic medical record.  These signatures attest to the fact that that the information above on your After Visit Summary has been reviewed and is understood.  Full responsibility of the confidentiality of this discharge information lies with you and/or your care-partner.  Read all handouts given to you by your recovery room nurse.   Thank-you for choosing [place] for your healthcare needs today.

## 2016-03-28 NOTE — Progress Notes (Signed)
Called to room to assist during endoscopic procedure.  Patient ID and intended procedure confirmed with present staff. Received instructions for my participation in the procedure from the performing physician.  

## 2016-03-28 NOTE — Op Note (Signed)
[place] Patient Name: Dennis Spencer Procedure Date: 03/28/2016 10:10 AM MRN: [id number] Endoscopist: Milus Banister , MD Age: 70 Referring MD:  Date of Birth: 08-Mar-1946 Gender: Male Account #: [id number] Procedure:                Colonoscopy Indications:              Screening for colorectal malignant neoplasm Medicines:                Monitored Anesthesia Care Procedure:                Pre-Anesthesia Assessment:                           - Prior to the procedure, a History and Physical                            was performed, and patient medications and                            allergies were reviewed. The patient's tolerance of                            previous anesthesia was also reviewed. The risks                            and benefits of the procedure and the sedation                            options and risks were discussed with the patient.                            All questions were answered, and informed consent                            was obtained. Prior Anticoagulants: The patient has                            taken no previous anticoagulant or antiplatelet                            agents. ASA Grade Assessment: III - A patient with                            severe systemic disease. After reviewing the risks                            and benefits, the patient was deemed in                            satisfactory condition to undergo the procedure.                           After obtaining informed consent, the colonoscope  was passed under direct vision. Throughout the                            procedure, the patient's blood pressure, pulse, and                            oxygen saturations were monitored continuously. The                            Model CF-HQ190L [id number]) scope was introduced                            through the anus and advanced to the the cecum,                            identified by  appendiceal orifice and ileocecal                            valve. The colonoscopy was performed without                            difficulty. The patient tolerated the procedure                            well. The quality of the bowel preparation was                            good. The ileocecal valve, appendiceal orifice, and                            rectum were photographed. Scope In: 10:13:01 AM Scope Out: 10:23:54 AM Scope Withdrawal Time: 0 hours 8 minutes 9 seconds  Total Procedure Duration: 0 hours 10 minutes 53 seconds  Findings:                 A 5 mm polyp was found in the cecum. The polyp was                            sessile. The polyp was removed with a cold snare.                            Resection and retrieval were complete.                           Multiple small and large-mouthed diverticula were                            found in the left colon.                           The exam was otherwise without abnormality on                            direct and retroflexion views. Complications:  No immediate complications. Estimated blood loss:                            None. Estimated Blood Loss:     Estimated blood loss: none. Impression:               - One 5 mm polyp in the cecum, removed with a cold                            snare. Resected and retrieved.                           - Diverticulosis in the left colon.                           - The examination was otherwise normal on direct                            and retroflexion views. Recommendation:           - Patient has a contact number available for                            emergencies. The signs and symptoms of potential                            delayed complications were discussed with the                            patient. Return to normal activities tomorrow.                            Written discharge instructions were provided to the                            patient.                            - Resume previous diet.                           - Continue present medications.                           You will receive a letter within 2-3 weeks with the                            pathology results and my final recommendations.                           If the polyp(s) is proven to be 'pre-cancerous' on                            pathology, you will need repeat colonoscopy in 5  years. If the polyp(s) is NOT 'precancerous' on                            pathology then you should repeat colon cancer                            screening in 10 years with colonoscopy without need                            for colon cancer screening by any method prior to                            then (including stool testing). Milus Banister, MD 03/28/2016 10:26:03 AM This report has been signed electronically.

## 2016-03-29 ENCOUNTER — Other Ambulatory Visit: Payer: Self-pay | Admitting: Family Medicine

## 2016-03-31 ENCOUNTER — Telehealth: Payer: Self-pay

## 2016-03-31 NOTE — Telephone Encounter (Signed)
  Follow up Call-  Call back number 03/28/2016  Post procedure Call Back phone  # [phone]  Permission to leave phone message Yes  Some recent data might be hidden     Patient questions:  Do you have a fever, pain , or abdominal swelling? No. Pain Score  0 *  Have you tolerated food without any problems? Yes.    Have you been able to return to your normal activities? Yes.    Do you have any questions about your discharge instructions: Diet   No. Medications  No. Follow up visit  No.  Do you have questions or concerns about your Care? No.  Actions: * If pain score is 4 or above: No action needed, pain <4.

## 2016-04-01 DIAGNOSIS — N200 Calculus of kidney: Secondary | ICD-10-CM | POA: Diagnosis not present

## 2016-04-02 ENCOUNTER — Encounter: Payer: Self-pay | Admitting: Gastroenterology

## 2016-04-17 ENCOUNTER — Encounter: Payer: Self-pay | Admitting: Family Medicine

## 2016-04-17 ENCOUNTER — Ambulatory Visit (INDEPENDENT_AMBULATORY_CARE_PROVIDER_SITE_OTHER): Payer: Commercial Managed Care - HMO | Admitting: Family Medicine

## 2016-04-17 VITALS — BP 129/64 | HR 65 | Temp 97.1°F | Ht 70.0 in | Wt 303.4 lb

## 2016-04-17 DIAGNOSIS — J209 Acute bronchitis, unspecified: Secondary | ICD-10-CM

## 2016-04-17 MED ORDER — AZITHROMYCIN 250 MG PO TABS: ORAL_TABLET | ORAL | 0 refills | Status: DC

## 2016-04-17 NOTE — Progress Notes (Signed)
   HPI  Patient presents today here with cough.  Patient explains that over the last 6 or 7 days he's had a croupy cough reductive of thick brown sputum, at times he has a small amount of blood in his sputum. He has a long-standing tracheostomy and has had irritation around his, lately.  He denies shortness of breath, difficulty tolerating foods or fluids, chest pain, or fevers. He has had some mild malaise.  PMH: Smoking status noted ROS: Per HPI  Objective: BP 129/64   Pulse 65   Temp 97.1 F (36.2 C) (Oral)   Ht 5\' 10"  (1.778 m)   Wt (!) 303 lb 6.4 oz (137.6 kg)   BMI 43.53 kg/m  Gen: NAD, alert, cooperative with exam HEENT: NCAT, TMs normal bilaterally, oropharynx clear, nares with swollen turbinate on the right Neck: Tracheostomy in place CV: RRR, good S1/S2, no murmur Resp: non-labored- mild scattered coarse breath sounds Ext: No edema, warm Neuro: Alert and oriented, No gross deficits  Assessment and plan:  # Acute bronchitis 6 days of cough with worsening course, patient also has a tracheostomy in place Covering with azithromycin Discussed supportive care, with swollen turbinates recommended starting Flonase or Nasacort as postnasal drip is likely contributing to symptoms Return to clinic with any concerns or worsening symptoms.   Meds ordered this encounter  Medications  . azithromycin (ZITHROMAX) 250 MG tablet    Sig: Take 2 tablets on day 1 and 1 tablet daily after that    Dispense:  6 tablet    Refill:  0    Laroy Apple, MD [facility] 04/17/2016, 9:28 AM

## 2016-04-17 NOTE — Patient Instructions (Signed)
Great to see you!  Be sure to finish all antibiotics   Acute Bronchitis, Adult Acute bronchitis is when air tubes (bronchi) in the lungs suddenly get swollen. The condition can make it hard to breathe. It can also cause these symptoms:  A cough.  Coughing up clear, yellow, or green mucus.  Wheezing.  Chest congestion.  Shortness of breath.  A fever.  Body aches.  Chills.  A sore throat. Follow these instructions at home: Medicines  Take over-the-counter and prescription medicines only as told by your doctor.  If you were prescribed an antibiotic medicine, take it as told by your doctor. Do not stop taking the antibiotic even if you start to feel better. General instructions  Rest.  Drink enough fluids to keep your pee (urine) clear or pale yellow.  Avoid smoking and secondhand smoke. If you smoke and you need help quitting, ask your doctor. Quitting will help your lungs heal faster.  Use an inhaler, cool mist vaporizer, or humidifier as told by your doctor.  Keep all follow-up visits as told by your doctor. This is important. How is this prevented? To lower your risk of getting this condition again:  Wash your hands often with soap and water. If you cannot use soap and water, use hand sanitizer.  Avoid contact with people who have cold symptoms.  Try not to touch your hands to your mouth, nose, or eyes.  Make sure to get the flu shot every year. Contact a doctor if:  Your symptoms do not get better in 2 weeks. Get help right away if:  You cough up blood.  You have chest pain.  You have very bad shortness of breath.  You become dehydrated.  You faint (pass out) or keep feeling like you are going to pass out.  You keep throwing up (vomiting).  You have a very bad headache.  Your fever or chills gets worse. This information is not intended to replace advice given to you by your health care provider. Make sure you discuss any questions you have with  your health care provider. Document Released: 10/15/2007 Document Revised: 12/05/2015 Document Reviewed: 10/17/2015 Elsevier Interactive Patient Education  2017 [employer].

## 2016-06-28 ENCOUNTER — Other Ambulatory Visit: Payer: Self-pay | Admitting: Family Medicine

## 2016-06-30 ENCOUNTER — Other Ambulatory Visit: Payer: Self-pay | Admitting: Family Medicine

## 2016-07-12 ENCOUNTER — Other Ambulatory Visit: Payer: Self-pay | Admitting: Family Medicine

## 2016-10-02 ENCOUNTER — Other Ambulatory Visit: Payer: Self-pay | Admitting: Family Medicine

## 2016-10-14 ENCOUNTER — Other Ambulatory Visit: Payer: Self-pay | Admitting: Family Medicine

## 2016-12-03 ENCOUNTER — Other Ambulatory Visit: Payer: Self-pay | Admitting: Family Medicine

## 2016-12-04 NOTE — Telephone Encounter (Signed)
last seen 04/17/16  Dr Wendi Snipes   Dr Livia Snellen PCP

## 2016-12-30 ENCOUNTER — Other Ambulatory Visit: Payer: Self-pay | Admitting: Family Medicine

## 2017-01-10 ENCOUNTER — Other Ambulatory Visit: Payer: Self-pay | Admitting: Family Medicine

## 2017-01-13 NOTE — Telephone Encounter (Signed)
Authorize 30 days only. Then contact the patient letting them know that they will need an appointment before any further prescriptions can be sent in. 

## 2017-02-11 ENCOUNTER — Ambulatory Visit (INDEPENDENT_AMBULATORY_CARE_PROVIDER_SITE_OTHER): Payer: Commercial Managed Care - HMO | Admitting: Family Medicine

## 2017-02-11 ENCOUNTER — Encounter: Payer: Self-pay | Admitting: Family Medicine

## 2017-02-11 VITALS — BP 133/66 | HR 65 | Temp 98.5°F | Ht 70.0 in | Wt 310.0 lb

## 2017-02-11 DIAGNOSIS — K219 Gastro-esophageal reflux disease without esophagitis: Secondary | ICD-10-CM | POA: Diagnosis not present

## 2017-02-11 DIAGNOSIS — R739 Hyperglycemia, unspecified: Secondary | ICD-10-CM

## 2017-02-11 DIAGNOSIS — Z23 Encounter for immunization: Secondary | ICD-10-CM

## 2017-02-11 DIAGNOSIS — M25569 Pain in unspecified knee: Secondary | ICD-10-CM

## 2017-02-11 DIAGNOSIS — N183 Chronic kidney disease, stage 3 unspecified: Secondary | ICD-10-CM

## 2017-02-11 DIAGNOSIS — I10 Essential (primary) hypertension: Secondary | ICD-10-CM

## 2017-02-11 DIAGNOSIS — R5383 Other fatigue: Secondary | ICD-10-CM

## 2017-02-11 DIAGNOSIS — E7800 Pure hypercholesterolemia, unspecified: Secondary | ICD-10-CM

## 2017-02-11 LAB — BAYER DCA HB A1C WAIVED: HB A1C: 5.8 % (ref <7.0)

## 2017-02-11 MED ORDER — AMLODIPINE BESYLATE 5 MG PO TABS: ORAL_TABLET | ORAL | 1 refills | Status: DC

## 2017-02-11 MED ORDER — GABAPENTIN 600 MG PO TABS: 300.0000 mg | ORAL_TABLET | Freq: Every day | ORAL | 1 refills | Status: DC

## 2017-02-11 MED ORDER — ATORVASTATIN CALCIUM 80 MG PO TABS: ORAL_TABLET | ORAL | 1 refills | Status: DC

## 2017-02-11 MED ORDER — RANITIDINE HCL 150 MG PO TABS: 150.0000 mg | ORAL_TABLET | Freq: Every day | ORAL | 1 refills | Status: DC

## 2017-02-11 NOTE — Progress Notes (Signed)
Subjective:  Patient ID: Dennis Spencer,  male    DOB: 16-Jun-1945  Age: 71 y.o.    CC: Follow-up (pt here today for routine follow up of his chronic medical conditions)   HPI Dennis Spencer presents for  follow-up of hypertension. Patient has no history of headache chest pain or shortness of breath or recent cough. Patient also denies symptoms of TIA such as numbness weakness lateralizing. Patient checks  blood pressure at home. Recent readings have been good Patient denies side effects from medication. States taking it regularly.  Patient also  in for follow-up of elevated cholesterol. Doing well without complaints on current medication. Denies side effects of statin including myalgia and arthralgia and nausea. Also in today for liver function testing. Currently no chest pain, shortness of breath or other cardiovascular related symptoms noted.  Patient in for follow-up of GERD. Currently asymptomatic taking  PPI daily. There is no chest pain or heartburn. No hematemesis and no melena. No dysphagia or choking. Onset is remote. Progression is stable. Complicating factors, none.   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   He has a past surgical history that includes Lithotripsy; Lithotripsy; Percutaneous nephrostolithotomy; Cystoscopy with retrograde pyelogram, ureteroscopy and stent placement (Bilateral, 06/18/2013); Holmium laser application (Bilateral, 06/18/2013); roux en y; Tracheostomy; Tonsillectomy; Nephrolithotomy (Left, 08/08/2013); Cystoscopy with retrograde pyelogram, ureteroscopy and stent placement (Right, 08/08/2013); Nephrolithotomy (Left, 08/10/2013);  Cystoscopy with retrograde pyelogram, ureteroscopy and stent placement (Right, 08/10/2013); Holmium laser application (Left, 05/19/5629); Cholecystectomy (N/A, 06/15/2015); Colonoscopy; Tonsillectomy; and Gastric bypass.   His family history includes Breast cancer in his maternal grandmother; Cancer in his mother; Colon cancer in his maternal grandfather; Diabetes in his father; Heart failure in his father; Hypertension in his father.He reports that he quit smoking about 48 years ago. His smoking use included Cigarettes. He has never used smokeless tobacco. He reports that he does not drink alcohol or use drugs.  Current Outpatient Prescriptions on File Prior to Visit  Medication Sig Dispense Refill  . aspirin EC 81 MG tablet Take 81 mg by mouth every morning.     . calcium citrate-vitamin D (CITRACAL+D) 315-200 MG-UNIT per tablet Take 2 tablets by mouth 3 (three) times daily.     . Cholecalciferol (VITAMIN D3) 2000 UNITS capsule Take 4,000 Units by mouth daily.    . Multiple Vitamin (MULTIVITAMIN WITH MINERALS) TABS tablet Take 1 tablet by mouth daily.    Marland Kitchen omeprazole (PRILOSEC) 20 MG capsule TAKE 1 CAPSULE (20 MG TOTAL) BY MOUTH DAILY. 90 capsule 3  . vitamin B-12 (CYANOCOBALAMIN) 1000 MCG tablet Take 1,000 mcg by mouth daily.    . vitamin C (ASCORBIC ACID) 500 MG tablet Take 500 mg by mouth 2 (two) times daily.     Current Facility-Administered Medications on File Prior to Visit  Medication Dose Route Frequency Provider Last Rate Last Dose  . 0.9 %  sodium chloride infusion  500 mL Intravenous Continuous Milus Banister, MD        ROS Review of Systems  Constitutional: Negative for chills, diaphoresis, fever and unexpected weight change.  HENT: Negative for congestion, hearing loss, rhinorrhea and sore throat.   Eyes: Negative for visual disturbance.  Respiratory: Negative for cough and shortness of breath.  Cardiovascular: Negative for chest pain.  Gastrointestinal: Negative for abdominal  pain, constipation and diarrhea.  Genitourinary: Negative for dysuria and flank pain.  Musculoskeletal: Negative for arthralgias and joint swelling.  Skin: Negative for rash.  Neurological: Negative for dizziness and headaches.  Psychiatric/Behavioral: Negative for dysphoric mood and sleep disturbance.    Objective:  BP 133/66   Pulse 65   Temp 98.5 F (36.9 C) (Oral)   Ht 5' 10"  (1.778 m)   Wt (!) 310 lb (140.6 kg)   BMI 44.48 kg/m   BP Readings from Last 3 Encounters:  02/11/17 133/66  04/17/16 129/64  03/28/16 136/77    Wt Readings from Last 3 Encounters:  02/11/17 (!) 310 lb (140.6 kg)  04/17/16 (!) 303 lb 6.4 oz (137.6 kg)  03/28/16 (!) 306 lb 12.8 oz (139.2 kg)     Physical Exam  Constitutional: He is oriented to person, place, and time. He appears well-developed and well-nourished. No distress.  HENT:  Head: Normocephalic and atraumatic.  Right Ear: External ear normal.  Left Ear: External ear normal.  Nose: Nose normal.  Mouth/Throat: Oropharynx is clear and moist.  Eyes: Pupils are equal, round, and reactive to light. Conjunctivae and EOM are normal.  Neck: Normal range of motion. Neck supple. No thyromegaly present.  Tracheostomy in place. Cannot evaluate thyroid. Carotids are free of bruit.  Cardiovascular: Normal rate, regular rhythm and normal heart sounds.   No murmur heard. Pulmonary/Chest: Effort normal and breath sounds normal. No respiratory distress. He has no wheezes. He has no rales.  Abdominal: Soft. Bowel sounds are normal. He exhibits no distension. There is no tenderness.  Lymphadenopathy:    He has no cervical adenopathy.  Neurological: He is alert and oriented to person, place, and time. He has normal reflexes.  Skin: Skin is warm and dry.  Psychiatric: He has a normal mood and affect. His behavior is normal. Judgment and thought content normal.    Diabetic Foot Exam - Simple   No data filed        Assessment & Plan:   Dennis Spencer  was seen today for follow-up.  Diagnoses and all orders for this visit:  Hyperglycemia -     CMP14+EGFR -     TSH -     Bayer DCA Hb A1c Waived  Hypercholesteremia -     Lipid panel -     atorvastatin (LIPITOR) 80 MG tablet; TAKE 1 TABLET (80 MG TOTAL) BY MOUTH DAILY AT 6 PM.  Essential hypertension -     amLODipine (NORVASC) 5 MG tablet; TAKE 1 TABLET BY MOUTH EVERY DAY IN THE MORNING  CKD (chronic kidney disease), stage III (HCC) -     CBC with Differential/Platelet -     CMP14+EGFR -     VITAMIN D 25 Hydroxy (Vit-D Deficiency, Fractures)  Gastroesophageal reflux disease without esophagitis -     ranitidine (ZANTAC) 150 MG tablet; Take 1 tablet (150 mg total) by mouth at bedtime.  Need for immunization against influenza -     Flu Vaccine QUAD 36+ mos IM  Knee pain, unspecified chronicity, unspecified laterality -     Ambulatory referral to Orthopedic Surgery  Other fatigue -     TSH  Other orders -     gabapentin (NEURONTIN) 600 MG tablet; Take 0.5 tablets (300 mg total) by mouth daily.   I have discontinued Mr. Cada's azithromycin. I have also changed his gabapentin and ranitidine. Additionally, I am having him maintain his vitamin C,  vitamin B-12, aspirin EC, Vitamin D3, multivitamin with minerals, calcium citrate-vitamin D, omeprazole, amLODipine, and atorvastatin. We will continue to administer sodium chloride.  Meds ordered this encounter  Medications  . amLODipine (NORVASC) 5 MG tablet    Sig: TAKE 1 TABLET BY MOUTH EVERY DAY IN THE MORNING    Dispense:  90 tablet    Refill:  1  . atorvastatin (LIPITOR) 80 MG tablet    Sig: TAKE 1 TABLET (80 MG TOTAL) BY MOUTH DAILY AT 6 PM.    Dispense:  90 tablet    Refill:  1  . gabapentin (NEURONTIN) 600 MG tablet    Sig: Take 0.5 tablets (300 mg total) by mouth daily.    Dispense:  45 tablet    Refill:  1  . ranitidine (ZANTAC) 150 MG tablet    Sig: Take 1 tablet (150 mg total) by mouth at bedtime.    Dispense:   90 tablet    Refill:  1     Follow-up: Return in about 6 months (around 08/12/2017).  Claretta Fraise, M.D.

## 2017-02-12 LAB — LIPID PANEL
CHOLESTEROL TOTAL: 134 mg/dL (ref 100–199)
Chol/HDL Ratio: 2.8 ratio (ref 0.0–5.0)
HDL: 48 mg/dL (ref >39)
LDL CALC: 66 mg/dL (ref 0–99)
TRIGLYCERIDES: 98 mg/dL (ref 0–149)
VLDL Cholesterol Cal: 20 mg/dL (ref 5–40)

## 2017-02-12 LAB — CBC WITH DIFFERENTIAL/PLATELET
BASOS ABS: 0.1 10*3/uL (ref 0.0–0.2)
BASOS: 2 %
EOS (ABSOLUTE): 0.2 10*3/uL (ref 0.0–0.4)
EOS: 4 %
Hematocrit: 43 % (ref 37.5–51.0)
Hemoglobin: 15 g/dL (ref 13.0–17.7)
Immature Grans (Abs): 0 10*3/uL (ref 0.0–0.1)
Immature Granulocytes: 0 %
LYMPHS: 29 %
Lymphocytes Absolute: 1.4 10*3/uL (ref 0.7–3.1)
MCH: 31.8 pg (ref 26.6–33.0)
MCHC: 34.9 g/dL (ref 31.5–35.7)
MCV: 91 fL (ref 79–97)
MONOCYTES: 12 %
MONOS ABS: 0.6 10*3/uL (ref 0.1–0.9)
NEUTROS ABS: 2.5 10*3/uL (ref 1.4–7.0)
NEUTROS PCT: 53 %
PLATELETS: 218 10*3/uL (ref 150–379)
RBC: 4.72 x10E6/uL (ref 4.14–5.80)
RDW: 13.3 % (ref 12.3–15.4)
WBC: 4.7 10*3/uL (ref 3.4–10.8)

## 2017-02-12 LAB — CMP14+EGFR
ALK PHOS: 76 IU/L (ref 39–117)
ALT: 18 IU/L (ref 0–44)
AST: 25 IU/L (ref 0–40)
Albumin/Globulin Ratio: 1.6 (ref 1.2–2.2)
Albumin: 3.8 g/dL (ref 3.5–4.8)
BUN/Creatinine Ratio: 12 (ref 10–24)
BUN: 15 mg/dL (ref 8–27)
Bilirubin Total: 0.8 mg/dL (ref 0.0–1.2)
CO2: 23 mmol/L (ref 20–29)
CREATININE: 1.27 mg/dL (ref 0.76–1.27)
Calcium: 9.6 mg/dL (ref 8.6–10.2)
Chloride: 102 mmol/L (ref 96–106)
GFR calc Af Amer: 66 mL/min/{1.73_m2} (ref >59)
GFR calc non Af Amer: 57 mL/min/{1.73_m2} — ABNORMAL LOW (ref >59)
GLOBULIN, TOTAL: 2.4 g/dL (ref 1.5–4.5)
GLUCOSE: 96 mg/dL (ref 65–99)
Potassium: 4.9 mmol/L (ref 3.5–5.2)
SODIUM: 141 mmol/L (ref 134–144)
Total Protein: 6.2 g/dL (ref 6.0–8.5)

## 2017-02-12 LAB — VITAMIN D 25 HYDROXY (VIT D DEFICIENCY, FRACTURES): Vit D, 25-Hydroxy: 53.8 ng/mL (ref 30.0–100.0)

## 2017-02-12 LAB — TSH: TSH: 2.49 u[IU]/mL (ref 0.450–4.500)

## 2017-02-16 DIAGNOSIS — G8929 Other chronic pain: Secondary | ICD-10-CM | POA: Diagnosis not present

## 2017-02-16 DIAGNOSIS — M25561 Pain in right knee: Secondary | ICD-10-CM | POA: Diagnosis not present

## 2017-02-16 DIAGNOSIS — M1711 Unilateral primary osteoarthritis, right knee: Secondary | ICD-10-CM | POA: Diagnosis not present

## 2017-02-16 DIAGNOSIS — M25562 Pain in left knee: Secondary | ICD-10-CM | POA: Diagnosis not present

## 2017-02-16 DIAGNOSIS — M170 Bilateral primary osteoarthritis of knee: Secondary | ICD-10-CM | POA: Diagnosis not present

## 2017-03-30 ENCOUNTER — Encounter: Payer: Self-pay | Admitting: Physician Assistant

## 2017-03-30 ENCOUNTER — Ambulatory Visit: Payer: Medicare HMO | Admitting: Physician Assistant

## 2017-03-30 VITALS — BP 139/67 | HR 73 | Temp 97.3°F | Ht 70.0 in | Wt 313.0 lb

## 2017-03-30 DIAGNOSIS — J40 Bronchitis, not specified as acute or chronic: Secondary | ICD-10-CM | POA: Diagnosis not present

## 2017-03-30 MED ORDER — PREDNISONE 10 MG (21) PO TBPK: ORAL_TABLET | ORAL | 0 refills | Status: DC

## 2017-03-30 MED ORDER — DOXYCYCLINE HYCLATE 100 MG PO TABS: 100.0000 mg | ORAL_TABLET | Freq: Two times a day (BID) | ORAL | 0 refills | Status: DC

## 2017-03-30 NOTE — Patient Instructions (Signed)
In a few days you may receive a survey in the mail or online from [employer] regarding your visit with us today. Please take a moment to fill this out. Your feedback is very important to our whole office. It can help us better understand your needs as well as improve your experience and satisfaction. Thank you for taking your time to complete it. We care about you.  Reagann Dolce, PA-C  

## 2017-03-30 NOTE — Progress Notes (Signed)
BP 139/67   Pulse 73   Temp (!) 97.3 F (36.3 C) (Oral)   Ht 5\' 10"  (1.778 m)   Wt (!) 313 lb (142 kg)   BMI 44.91 kg/m    Subjective:    Patient ID: Dennis Spencer, male    DOB: 09-30-1945, 71 y.o.   MRN: [id number]  HPI: Dennis Spencer is a 71 y.o. male presenting on 03/30/2017 for Cough (productive) and Bronchitis  Patient with several days of progressing upper respiratory and bronchial symptoms. Initially there was more upper respiratory congestion. This progressed to having significant cough that is productive throughout the day and severe at night. There is occasional wheezing after coughing. Sometimes there is slight dyspnea on exertion. It is productive mucus that is yellow in color. Denies any blood. Has had bronchitis in the past.  Relevant past medical, surgical, family and social history reviewed and updated as indicated. Allergies and medications reviewed and updated.  Past Medical History:  Diagnosis Date  . Arthritis   . CKD (chronic kidney disease), stage III ([place]) 11/10/2013  . Gallstones   . GERD (gastroesophageal reflux disease)   . 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  . Hypercholesteremia   . Hypertension   . Kidney stones   . Osteomyelitis ([place])    LLE age 62  . 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 Severe obesity (BMI >= 40) ([place]) 11/12/2013  . Sleep apnea    s/p tracheostomy    Past Surgical History:  Procedure Laterality Date  . 1ST STAGE NEPHROLITHOTOMY PERCUTANEOUS WITH SURGEON ACCESS Left 08/08/2013   Performed by Phebe Colla, MD at [facility] ORS  . 2ND STAGE CYSTOSCOPY WITH RETROGRADE PYELOGRAM, URETEROSCOPY AND STENT EXCHANGE Right 08/10/2013   Performed by Phebe Colla, MD at [facility] ORS  . COLONOSCOPY    . CYSTOSCOPY WITH RETROGRADE PYELOGRAM, Left URETEROSCOPY with laser , AND bilateral  STENT PLACEMENT Bilateral 06/18/2013   Performed by Phebe Colla, MD at [facility] ORS  . CYSTOSCOPY WITH RIGHT RETROGRADE PYELOGRAM, URETEROSCOPY AND STENT PLACEMENT, stone extraction Right 08/08/2013   Performed by Phebe Colla, MD at [facility] ORS  . GASTRIC BYPASS     2004  . HOLMIUM LASER APPLICATION Left 06/16/2351   Performed by Phebe Colla, MD at [facility] ORS  . HOLMIUM LASER APPLICATION Bilateral 10/10/4429   Performed by Phebe Colla, MD at [facility] ORS  . LAPAROSCOPIC CHOLECYSTECTOMY N/A 06/15/2015   Performed by Ralene Ok, MD at [facility] ORS  . LITHOTRIPSY    . LITHOTRIPSY    . NEPHROLITHOTOMY PERCUTANEOUS SECOND LOOK/LEFT DIGITAL URETEROSCOPY/BASKETING OF STONE/EXCHANGE OF LEFT URETERAL STENT Left 08/10/2013   Performed by Phebe Colla, MD at [facility] ORS  . PERCUTANEOUS NEPHROSTOLITHOTOMY    . roux en y     [facility] (weight lost 155 lbs.) Stable -has gained 20 lbs over desired 250#  desired level  . TONSILLECTOMY     child  . TONSILLECTOMY    . TRACHEOSTOMY     '87- s/p tongue and nasal septum deviation and Sleep apnea surgery    Review of Systems  Constitutional: Positive for fatigue. Negative for appetite change and fever.  HENT: Positive for congestion, sinus pressure and sore throat.   Eyes: Negative.  Negative for pain and visual disturbance.  Respiratory: Positive for cough and wheezing. Negative for  chest tightness and shortness of breath.   Cardiovascular: Negative.  Negative for chest pain, palpitations and leg swelling.  Gastrointestinal: Negative.  Negative for abdominal pain, diarrhea, nausea and vomiting.  Endocrine: Negative.   Genitourinary: Negative.   Musculoskeletal: Positive for back pain and myalgias.  Skin: Negative.  Negative for color change and rash.  Neurological: Positive for headaches. Negative for weakness and numbness.  Psychiatric/Behavioral: Negative.     Allergies as of 03/30/2017      Reactions   Lisinopril Other (See Comments)   DIZZINESS      Medication List         Accurate as of 03/30/17  3:42 PM. Always use your most recent med list.          amLODipine 5 MG tablet Commonly known as:  NORVASC TAKE 1 TABLET BY MOUTH EVERY DAY IN THE MORNING   aspirin EC 81 MG tablet Take 81 mg by mouth every morning.   atorvastatin 80 MG tablet Commonly known as:  LIPITOR TAKE 1 TABLET (80 MG TOTAL) BY MOUTH DAILY AT 6 PM.   calcium citrate-vitamin D 315-200 MG-UNIT tablet Commonly known as:  CITRACAL+D Take 2 tablets by mouth 3 (three) times daily.   doxycycline 100 MG tablet Commonly known as:  VIBRA-TABS Take 1 tablet (100 mg total) 2 (two) times daily by mouth. 1 po bid   gabapentin 600 MG tablet Commonly known as:  NEURONTIN Take 0.5 tablets (300 mg total) by mouth daily.   multivitamin with minerals Tabs tablet Take 1 tablet by mouth daily.   naproxen sodium 220 MG tablet Commonly known as:  ALEVE Take 220 mg 2 (two) times daily as needed by mouth.   omeprazole 20 MG capsule Commonly known as:  PRILOSEC TAKE 1 CAPSULE (20 MG TOTAL) BY MOUTH DAILY.   predniSONE 10 MG (21) Tbpk tablet Commonly known as:  STERAPRED UNI-PAK 21 TAB As directed x 6 days   ranitidine 150 MG tablet Commonly known as:  ZANTAC Take 1 tablet (150 mg total) by mouth at bedtime.   vitamin B-12 1000 MCG tablet Commonly known as:  CYANOCOBALAMIN Take 1,000 mcg by mouth daily.   vitamin C 500 MG tablet Commonly known as:  ASCORBIC ACID Take 500 mg by mouth 2 (two) times daily.   Vitamin D3 2000 units capsule Take 4,000 Units by mouth daily.          Objective:    BP 139/67   Pulse 73   Temp (!) 97.3 F (36.3 C) (Oral)   Ht 5\' 10"  (1.778 m)   Wt (!) 313 lb (142 kg)   BMI 44.91 kg/m   Allergies  Allergen Reactions  . Lisinopril Other (See Comments)    DIZZINESS    Physical Exam  Constitutional: He appears well-developed and well-nourished.  HENT:  Head: Normocephalic and atraumatic.  Right Ear: Hearing and tympanic membrane normal.    Left Ear: Hearing and tympanic membrane normal.  Nose: Mucosal edema and sinus tenderness present. No nasal deformity. Right sinus exhibits frontal sinus tenderness. Left sinus exhibits frontal sinus tenderness.  Mouth/Throat: Posterior oropharyngeal erythema present.  Eyes: Conjunctivae and EOM are normal. Pupils are equal, round, and reactive to light. Right eye exhibits no discharge. Left eye exhibits no discharge.  Neck: Normal range of motion. Neck supple.  Cardiovascular: Normal rate, regular rhythm and normal heart sounds.  Pulmonary/Chest: Effort normal. No respiratory distress. He has no decreased breath sounds. He has wheezes. He has no rhonchi. He  has no rales.  Abdominal: Soft. Bowel sounds are normal.  Musculoskeletal: Normal range of motion.  Skin: Skin is warm and dry.        Assessment & Plan:   1. Bronchitis - doxycycline (VIBRA-TABS) 100 MG tablet; Take 1 tablet (100 mg total) 2 (two) times daily by mouth. 1 po bid  Dispense: 20 tablet; Refill: 0 - predniSONE (STERAPRED UNI-PAK 21 TAB) 10 MG (21) TBPK tablet; As directed x 6 days  Dispense: 21 tablet; Refill: 0    Current Outpatient Medications:  .  amLODipine (NORVASC) 5 MG tablet, TAKE 1 TABLET BY MOUTH EVERY DAY IN THE MORNING, Disp: 90 tablet, Rfl: 1 .  aspirin EC 81 MG tablet, Take 81 mg by mouth every morning. , Disp: , Rfl:  .  atorvastatin (LIPITOR) 80 MG tablet, TAKE 1 TABLET (80 MG TOTAL) BY MOUTH DAILY AT 6 PM., Disp: 90 tablet, Rfl: 1 .  calcium citrate-vitamin D (CITRACAL+D) 315-200 MG-UNIT per tablet, Take 2 tablets by mouth 3 (three) times daily. , Disp: , Rfl:  .  Cholecalciferol (VITAMIN D3) 2000 UNITS capsule, Take 4,000 Units by mouth daily., Disp: , Rfl:  .  gabapentin (NEURONTIN) 600 MG tablet, Take 0.5 tablets (300 mg total) by mouth daily., Disp: 45 tablet, Rfl: 1 .  Multiple Vitamin (MULTIVITAMIN WITH MINERALS) TABS tablet, Take 1 tablet by mouth daily., Disp: , Rfl:  .  naproxen sodium  (ALEVE) 220 MG tablet, Take 220 mg 2 (two) times daily as needed by mouth., Disp: , Rfl:  .  omeprazole (PRILOSEC) 20 MG capsule, TAKE 1 CAPSULE (20 MG TOTAL) BY MOUTH DAILY., Disp: 90 capsule, Rfl: 3 .  ranitidine (ZANTAC) 150 MG tablet, Take 1 tablet (150 mg total) by mouth at bedtime., Disp: 90 tablet, Rfl: 1 .  vitamin B-12 (CYANOCOBALAMIN) 1000 MCG tablet, Take 1,000 mcg by mouth daily., Disp: , Rfl:  .  vitamin C (ASCORBIC ACID) 500 MG tablet, Take 500 mg by mouth 2 (two) times daily., Disp: , Rfl:  .  doxycycline (VIBRA-TABS) 100 MG tablet, Take 1 tablet (100 mg total) 2 (two) times daily by mouth. 1 po bid, Disp: 20 tablet, Rfl: 0 .  predniSONE (STERAPRED UNI-PAK 21 TAB) 10 MG (21) TBPK tablet, As directed x 6 days, Disp: 21 tablet, Rfl: 0  Current Facility-Administered Medications:  .  0.9 %  sodium chloride infusion, 500 mL, Intravenous, Continuous, Milus Banister, MD Continue all other maintenance medications as listed above.  Follow up plan: Return if symptoms worsen or fail to improve.  Educational handout given for [place] PA-C [place] [address]  [place], [place] [address] [phone]   03/30/2017, 3:42 PM

## 2017-04-07 DIAGNOSIS — N200 Calculus of kidney: Secondary | ICD-10-CM | POA: Diagnosis not present

## 2017-04-14 DIAGNOSIS — Z125 Encounter for screening for malignant neoplasm of prostate: Secondary | ICD-10-CM | POA: Diagnosis not present

## 2017-04-14 DIAGNOSIS — N289 Disorder of kidney and ureter, unspecified: Secondary | ICD-10-CM | POA: Diagnosis not present

## 2017-04-14 DIAGNOSIS — N5201 Erectile dysfunction due to arterial insufficiency: Secondary | ICD-10-CM | POA: Diagnosis not present

## 2017-04-14 DIAGNOSIS — N200 Calculus of kidney: Secondary | ICD-10-CM | POA: Diagnosis not present

## 2017-04-22 ENCOUNTER — Other Ambulatory Visit: Payer: Self-pay | Admitting: Urology

## 2017-05-12 DIAGNOSIS — I639 Cerebral infarction, unspecified: Secondary | ICD-10-CM

## 2017-05-12 HISTORY — DX: Cerebral infarction, unspecified: I63.9

## 2017-05-14 NOTE — Patient Instructions (Addendum)
Dennis Spencer  05/14/2017   Your procedure is scheduled on: 05/22/2017    Report to [facility] Main  Entrance   Report to admitting at 100pm.     Call this number if you have problems the morning of surgery [phone]    Remember: ONLY 1 PERSON MAY GO WITH YOU TO SHORT STAY TO GET  READY MORNING OF YOUR SURGERY.  Do not eat food after midnite.  May have clear liquids from 12 midnite until 0900am then nothing by mouth.      Take these medicines the morning of surgery with A SIP OF WATER: Amlodipine ( Norvasc), Prilosec                                 You may not have any metal on your body including hair pins and              piercings  Do not wear jewelry, , lotions, powders or perfumes, deodorant              Men may shave face and neck.   Do not bring valuables to the hospital. [place].  Contacts, dentures or bridgework may not be worn into surgery.      Patients discharged the day of surgery will not be allowed to drive home.  Name and phone number of your driver: Ortencia Kick CELL [phone]               Please read over the following fact sheets you were given: _____________________________________________________________________                CLEAR LIQUID DIET   Foods Allowed                                                                     Foods Excluded  Coffee and tea, regular and decaf                             liquids that you cannot  Plain Jell-O in any flavor                                             see through such as: Fruit ices (not with fruit pulp)                                     milk, soups, orange juice  Iced Popsicles                                    All solid food Carbonated beverages, regular and diet  Cranberry, grape and apple juices Sports drinks like Gatorade Lightly seasoned clear broth or consume(fat  free) Sugar, honey syrup  Sample Menu Breakfast                                Lunch                                     Supper Cranberry juice                    Beef broth                            Chicken broth Jell-O                                     Grape juice                           Apple juice Coffee or tea                        Jell-O                                      Popsicle                                                Coffee or tea                        Coffee or tea  _____________________________________________________________________  [facility] - Preparing for Surgery Before surgery, you can play an important role.  Because skin is not sterile, your skin needs to be as free of germs as possible.  You can reduce the number of germs on your skin by washing with CHG (chlorahexidine gluconate) soap before surgery.  CHG is an antiseptic cleaner which kills germs and bonds with the skin to continue killing germs even after washing. Please DO NOT use if you have an allergy to CHG or antibacterial soaps.  If your skin becomes reddened/irritated stop using the CHG and inform your nurse when you arrive at Short Stay. Do not shave (including legs and underarms) for at least 48 hours prior to the first CHG shower.  You may shave your face/neck. Please follow these instructions carefully:  1.  Shower with CHG Soap the night before surgery and the  morning of Surgery.  2.  If you choose to wash your hair, wash your hair first as usual with your  normal  shampoo.  3.  After you shampoo, rinse your hair and body thoroughly to remove the  shampoo.                           4.  Use CHG as you would any other liquid soap.  You can apply chg directly  to the skin and wash  Gently with a scrungie or clean washcloth.  5.  Apply the CHG Soap to your body ONLY FROM THE NECK DOWN.   Do not use on face/ open                           Wound or open sores. Avoid contact  with eyes, ears mouth and genitals (private parts).                       Wash face,  Genitals (private parts) with your normal soap.             6.  Wash thoroughly, paying special attention to the area where your surgery  will be performed.  7.  Thoroughly rinse your body with warm water from the neck down.  8.  DO NOT shower/wash with your normal soap after using and rinsing off  the CHG Soap.                9.  Pat yourself dry with a clean towel.            10.  Wear clean pajamas.            11.  Place clean sheets on your bed the night of your first shower and do not  sleep with pets. Day of Surgery : Do not apply any lotions/deodorants the morning of surgery.  Please wear clean clothes to the hospital/surgery center.  FAILURE TO FOLLOW THESE INSTRUCTIONS MAY RESULT IN THE CANCELLATION OF YOUR SURGERY PATIENT SIGNATURE_________________________________  NURSE SIGNATURE__________________________________  ________________________________________________________________________

## 2017-05-15 ENCOUNTER — Encounter (HOSPITAL_COMMUNITY): Payer: Self-pay

## 2017-05-15 ENCOUNTER — Encounter (HOSPITAL_COMMUNITY)
Admission: RE | Admit: 2017-05-15 | Discharge: 2017-05-15 | Disposition: A | Discharge status: Home / Self Care | Payer: Medicare HMO | Source: Ambulatory Visit | Attending: Urology | Admitting: Urology

## 2017-05-15 ENCOUNTER — Other Ambulatory Visit: Payer: Self-pay

## 2017-05-15 DIAGNOSIS — N200 Calculus of kidney: Secondary | ICD-10-CM | POA: Insufficient documentation

## 2017-05-15 DIAGNOSIS — I10 Essential (primary) hypertension: Secondary | ICD-10-CM | POA: Diagnosis not present

## 2017-05-15 DIAGNOSIS — R001 Bradycardia, unspecified: Secondary | ICD-10-CM | POA: Insufficient documentation

## 2017-05-15 DIAGNOSIS — Z01818 Encounter for other preprocedural examination: Secondary | ICD-10-CM | POA: Insufficient documentation

## 2017-05-15 LAB — CBC
HCT: 44.7 % (ref 39.0–52.0)
HEMOGLOBIN: 14.9 g/dL (ref 13.0–17.0)
MCH: 31.3 pg (ref 26.0–34.0)
MCHC: 33.3 g/dL (ref 30.0–36.0)
MCV: 93.9 fL (ref 78.0–100.0)
Platelets: 216 10*3/uL (ref 150–400)
RBC: 4.76 MIL/uL (ref 4.22–5.81)
RDW: 13.4 % (ref 11.5–15.5)
WBC: 5.8 10*3/uL (ref 4.0–10.5)

## 2017-05-15 LAB — BASIC METABOLIC PANEL
Anion gap: 8 (ref 5–15)
BUN: 16 mg/dL (ref 6–20)
CHLORIDE: 107 mmol/L (ref 101–111)
CO2: 28 mmol/L (ref 22–32)
Calcium: 9.4 mg/dL (ref 8.9–10.3)
Creatinine, Ser: 1.27 mg/dL — ABNORMAL HIGH (ref 0.61–1.24)
GFR calc Af Amer: >60 mL/min (ref >60)
GFR calc non Af Amer: 56 mL/min — ABNORMAL LOW (ref >60)
GLUCOSE: 104 mg/dL — AB (ref 65–99)
POTASSIUM: 4.6 mmol/L (ref 3.5–5.1)
Sodium: 143 mmol/L (ref 135–145)

## 2017-05-19 DIAGNOSIS — N200 Calculus of kidney: Secondary | ICD-10-CM | POA: Diagnosis not present

## 2017-05-21 MED ORDER — GENTAMICIN SULFATE 40 MG/ML IJ SOLN
500.0000 mg | INTRAVENOUS | Status: DC
  Filled 2017-05-21 (×2): qty 12.5

## 2017-05-22 ENCOUNTER — Encounter (HOSPITAL_COMMUNITY)
Admission: RE | Disposition: A | Discharge status: Home / Self Care | Payer: Self-pay | Source: Ambulatory Visit | Attending: Urology

## 2017-05-22 ENCOUNTER — Ambulatory Visit (HOSPITAL_COMMUNITY)
Admission: RE | Admit: 2017-05-22 | Discharge: 2017-05-22 | Disposition: A | Discharge status: Home / Self Care | Payer: Medicare HMO | Source: Ambulatory Visit | Attending: Urology | Admitting: Urology

## 2017-05-22 ENCOUNTER — Ambulatory Visit (HOSPITAL_COMMUNITY): Payer: Medicare HMO | Admitting: Anesthesiology

## 2017-05-22 ENCOUNTER — Ambulatory Visit (HOSPITAL_COMMUNITY): Payer: Medicare HMO

## 2017-05-22 ENCOUNTER — Encounter (HOSPITAL_COMMUNITY): Payer: Self-pay | Admitting: *Deleted

## 2017-05-22 DIAGNOSIS — E7800 Pure hypercholesterolemia, unspecified: Secondary | ICD-10-CM | POA: Diagnosis not present

## 2017-05-22 DIAGNOSIS — K219 Gastro-esophageal reflux disease without esophagitis: Secondary | ICD-10-CM | POA: Insufficient documentation

## 2017-05-22 DIAGNOSIS — I129 Hypertensive chronic kidney disease with stage 1 through stage 4 chronic kidney disease, or unspecified chronic kidney disease: Secondary | ICD-10-CM | POA: Diagnosis not present

## 2017-05-22 DIAGNOSIS — Z87891 Personal history of nicotine dependence: Secondary | ICD-10-CM | POA: Insufficient documentation

## 2017-05-22 DIAGNOSIS — G4730 Sleep apnea, unspecified: Secondary | ICD-10-CM | POA: Insufficient documentation

## 2017-05-22 DIAGNOSIS — N132 Hydronephrosis with renal and ureteral calculous obstruction: Secondary | ICD-10-CM | POA: Diagnosis not present

## 2017-05-22 DIAGNOSIS — Z79899 Other long term (current) drug therapy: Secondary | ICD-10-CM | POA: Insufficient documentation

## 2017-05-22 DIAGNOSIS — Z9884 Bariatric surgery status: Secondary | ICD-10-CM | POA: Insufficient documentation

## 2017-05-22 DIAGNOSIS — Z87442 Personal history of urinary calculi: Secondary | ICD-10-CM | POA: Diagnosis not present

## 2017-05-22 DIAGNOSIS — N183 Chronic kidney disease, stage 3 (moderate): Secondary | ICD-10-CM | POA: Insufficient documentation

## 2017-05-22 DIAGNOSIS — N200 Calculus of kidney: Secondary | ICD-10-CM | POA: Insufficient documentation

## 2017-05-22 DIAGNOSIS — Z6841 Body Mass Index (BMI) 40.0 and over, adult: Secondary | ICD-10-CM | POA: Diagnosis not present

## 2017-05-22 DIAGNOSIS — Z930 Tracheostomy status: Secondary | ICD-10-CM | POA: Diagnosis not present

## 2017-05-22 HISTORY — PX: HOLMIUM LASER APPLICATION: SHX5852

## 2017-05-22 HISTORY — PX: CYSTOSCOPY WITH RETROGRADE PYELOGRAM, URETEROSCOPY AND STENT PLACEMENT: SHX5789

## 2017-05-22 SURGERY — CYSTOURETEROSCOPY, WITH RETROGRADE PYELOGRAM AND STENT INSERTION
Anesthesia: General | Laterality: Bilateral

## 2017-05-22 MED ORDER — ONDANSETRON HCL 4 MG/2ML IJ SOLN
INTRAMUSCULAR | Status: DC | PRN
  Administered 2017-05-22: 4 mg via INTRAVENOUS

## 2017-05-22 MED ORDER — IOHEXOL 300 MG/ML  SOLN
INTRAMUSCULAR | Status: DC | PRN
  Administered 2017-05-22: 40 mL via URETHRAL

## 2017-05-22 MED ORDER — FENTANYL CITRATE (PF) 100 MCG/2ML IJ SOLN
INTRAMUSCULAR | Status: AC
  Filled 2017-05-22: qty 2

## 2017-05-22 MED ORDER — TRAMADOL HCL 50 MG PO TABS
50.0000 mg | ORAL_TABLET | Freq: Once | ORAL | Status: AC
  Administered 2017-05-22: 50 mg via ORAL
  Filled 2017-05-22: qty 1

## 2017-05-22 MED ORDER — PROPOFOL 10 MG/ML IV BOLUS
INTRAVENOUS | Status: AC
  Filled 2017-05-22: qty 20

## 2017-05-22 MED ORDER — GENTAMICIN SULFATE 40 MG/ML IJ SOLN
5.0000 mg/kg | INTRAVENOUS | Status: AC
  Administered 2017-05-22: 500 mg via INTRAVENOUS
  Filled 2017-05-22: qty 12.5

## 2017-05-22 MED ORDER — SODIUM CHLORIDE 0.9 % IR SOLN
Status: DC | PRN
  Administered 2017-05-22: 1000 mL

## 2017-05-22 MED ORDER — PROPOFOL 10 MG/ML IV BOLUS
INTRAVENOUS | Status: DC | PRN
  Administered 2017-05-22: 50 mg via INTRAVENOUS
  Administered 2017-05-22: 150 mg via INTRAVENOUS

## 2017-05-22 MED ORDER — DEXAMETHASONE SODIUM PHOSPHATE 10 MG/ML IJ SOLN
INTRAMUSCULAR | Status: DC | PRN
  Administered 2017-05-22: 5 mg via INTRAVENOUS

## 2017-05-22 MED ORDER — FENTANYL CITRATE (PF) 100 MCG/2ML IJ SOLN
INTRAMUSCULAR | Status: DC | PRN
  Administered 2017-05-22: 100 ug via INTRAVENOUS

## 2017-05-22 MED ORDER — KETOROLAC TROMETHAMINE 10 MG PO TABS: 10.0000 mg | ORAL_TABLET | Freq: Three times a day (TID) | ORAL | 0 refills | Status: DC | PRN

## 2017-05-22 MED ORDER — FENTANYL CITRATE (PF) 100 MCG/2ML IJ SOLN: 25.0000 ug | INTRAMUSCULAR | Status: DC | PRN

## 2017-05-22 MED ORDER — ONDANSETRON HCL 4 MG/2ML IJ SOLN: 4.0000 mg | Freq: Once | INTRAMUSCULAR | Status: DC | PRN

## 2017-05-22 MED ORDER — LIDOCAINE 2% (20 MG/ML) 5 ML SYRINGE
INTRAMUSCULAR | Status: DC | PRN
  Administered 2017-05-22: 60 mg via INTRAVENOUS

## 2017-05-22 MED ORDER — SUCCINYLCHOLINE CHLORIDE 20 MG/ML IJ SOLN
INTRAMUSCULAR | Status: DC | PRN
  Administered 2017-05-22: 80 mg via INTRAVENOUS

## 2017-05-22 MED ORDER — TRAMADOL HCL 50 MG PO TABS: 50.0000 mg | ORAL_TABLET | Freq: Four times a day (QID) | ORAL | 0 refills | Status: DC | PRN

## 2017-05-22 MED ORDER — SODIUM CHLORIDE 0.9 % IR SOLN
Status: DC | PRN
  Administered 2017-05-22: 3000 mL

## 2017-05-22 MED ORDER — EPHEDRINE SULFATE-NACL 50-0.9 MG/10ML-% IV SOSY
PREFILLED_SYRINGE | INTRAVENOUS | Status: DC | PRN
  Administered 2017-05-22: 10 mg via INTRAVENOUS

## 2017-05-22 MED ORDER — LACTATED RINGERS IV SOLN
INTRAVENOUS | Status: DC
  Administered 2017-05-22: 14:00:00 via INTRAVENOUS

## 2017-05-22 SURGICAL SUPPLY — 24 items
BAG URO CATCHER STRL LF (MISCELLANEOUS) ×3 IMPLANT
BASKET LASER NITINOL 1.9FR (BASKET) IMPLANT
BSKT STON RTRVL 120 1.9FR (BASKET)
CATH INTERMIT  6FR 70CM (CATHETERS) ×3 IMPLANT
CLOTH BEACON ORANGE TIMEOUT ST (SAFETY) ×3 IMPLANT
COVER FOOTSWITCH UNIV (MISCELLANEOUS) ×3 IMPLANT
COVER SURGICAL LIGHT HANDLE (MISCELLANEOUS) ×3 IMPLANT
FIBER LASER FLEXIVA 365 (UROLOGICAL SUPPLIES) IMPLANT
FIBER LASER FLEXIVA 550 (UROLOGICAL SUPPLIES) IMPLANT
FIBER LASER TRAC TIP (UROLOGICAL SUPPLIES) ×2 IMPLANT
GLOVE BIOGEL M STRL SZ7.5 (GLOVE) ×3 IMPLANT
GOWN STRL REUS W/TWL LRG LVL3 (GOWN DISPOSABLE) ×6 IMPLANT
GUIDEWIRE ANG ZIPWIRE 038X150 (WIRE) ×3 IMPLANT
GUIDEWIRE STR DUAL SENSOR (WIRE) ×5 IMPLANT
IV NS 1000ML (IV SOLUTION) ×3
IV NS 1000ML BAXH (IV SOLUTION) ×1 IMPLANT
MANIFOLD NEPTUNE II (INSTRUMENTS) ×3 IMPLANT
PACK CYSTO (CUSTOM PROCEDURE TRAY) ×3 IMPLANT
SHEATH URETERAL 12FRX35CM (MISCELLANEOUS) ×2 IMPLANT
STENT POLARIS 5FRX26 (STENTS) ×2 IMPLANT
SYR CONTROL 10ML LL (SYRINGE) ×3 IMPLANT
TUBE FEEDING 8FR 16IN STR KANG (MISCELLANEOUS) ×3 IMPLANT
TUBING CONNECTING 10 (TUBING) ×2 IMPLANT
TUBING CONNECTING 10' (TUBING) ×1

## 2017-05-22 NOTE — Progress Notes (Signed)
PACU Nsg NOte- Discharge Instructions: Pt alert and oriented, VSS, pain under control, pt able to tolerate PO fluids and voided w/o difficulty in bathroom, gait very steady, pt voices no complaints. DC instructions reviewed with pt and wife by prev RN. Reinforced DC instructions and reviewed general anesthesia instructions as well. Opportunity for questions provided. Teach Back method utilized. Pt escorted to exit via wheelchair

## 2017-05-22 NOTE — Discharge Instructions (Signed)
1 - You may have urinary urgency (bladder spasms) and bloody urine on / off with stent in place. This is normal. ° °2 - Call MD or go to ER for fever >102, severe pain / nausea / vomiting not relieved by medications, or acute change in medical status ° °

## 2017-05-22 NOTE — H&P (Signed)
Dennis Spencer is an 72 y.o. male.     Chief Complaint: Pre-op 1st stage BILATERAL ureteroscopic stone manipulation   HPI:   1 - Recurrent Surgical Nephrolithiasis -   2011 - PCNL for large tones  2015 - Rt URS / Lt PCNL for huge volume left renal and bilateral ureteral stones, Total stone volume >6cm2    Recent Surveillance:   04/2014 - KUB, Renal US - ? tiny RLP and LUP stones by [place] alone, no hydro. KUB clear  11/2014 - KUB, Renal US - likely 52mm Rt sided stones x2, no hydro.  03/2016 - KUB, Renal US, CMP - several 3-4 mm scattered non-obstructing stones bilaterally, ?65mm left x 1. Cr <1.5 and lytes normal by PCP labs.  03/2017 -= KUB, RUS, CMP - progression of left lower pole stone, now approx 2.5cm and bilateral intra-renal stones, normal kidney function and electrolytes.   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 Currently on CaCO4 daily, though only BID and rel low-dose.   Eval 2011 - 24 hr urine - severe hyperoxaluria >170   Eval 2015 - BMP,PTH,Urate - normal, Composition - CaOx; 24 hr Urines - high oxalate (60), low citrate (160) --> CaCitrate 2080 MEQ TID meals   PMH sig for morbid obesity, trach (since 1980s). Denies ischemic heart disease. His PCP is Claretta Fraise MD with [facility].    Today [name] is seen to proceed with 1st stage bilateral ureteroscopic stone manipulation. No interval fevers. Most recent UCX negative.      Past Medical History:  Diagnosis Date  . Arthritis   . CKD (chronic kidney disease), stage III ([place]) 11/10/2013  . Gallstones   . GERD (gastroesophageal reflux disease)   . 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  . Hypercholesteremia   . Hypertension   . Kidney stones   . Osteomyelitis ([place])    LLE age 47  . Pneumonia 6-7 YRS AGO  . 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 Severe obesity (BMI >= 40) ([place]) 11/12/2013  . Sleep apnea    s/p tracheostomy, LEAVES TRACH OPEN AT NIGHT FOR SLEEP APNEA    Past Surgical History:  Procedure Laterality Date  . CHOLECYSTECTOMY N/A 06/15/2015   Procedure: LAPAROSCOPIC CHOLECYSTECTOMY;  Surgeon: Ralene Ok, MD;  Location: [facility];  Service: General;  Laterality: N/A;  . COLONOSCOPY    . CYSTOSCOPY WITH RETROGRADE PYELOGRAM, URETEROSCOPY AND STENT PLACEMENT Bilateral 06/18/2013   Procedure: CYSTOSCOPY WITH RETROGRADE PYELOGRAM, Left URETEROSCOPY with laser , AND bilateral STENT PLACEMENT;  Surgeon: Alexis Frock, MD;  Location: [facility];  Service: Urology;  Laterality: Bilateral;  . CYSTOSCOPY WITH RETROGRADE PYELOGRAM, URETEROSCOPY AND STENT PLACEMENT Right 08/08/2013   Procedure: CYSTOSCOPY WITH RIGHT RETROGRADE PYELOGRAM, URETEROSCOPY AND STENT PLACEMENT, stone extraction;  Surgeon: Alexis Frock, MD;  Location: [facility];  Service: Urology;  Laterality: Right;  . CYSTOSCOPY WITH RETROGRADE PYELOGRAM, URETEROSCOPY AND STENT PLACEMENT Right 08/10/2013   Procedure: 2ND STAGE CYSTOSCOPY WITH RETROGRADE PYELOGRAM, URETEROSCOPY AND STENT EXCHANGE;  Surgeon: Alexis Frock, MD;  Location: [facility];  Service: Urology;  Laterality: Right;  . GASTRIC BYPASS     2004  . HOLMIUM LASER APPLICATION Bilateral 09/10/7614   Procedure: HOLMIUM LASER APPLICATION;  Surgeon: Alexis Frock, MD;  Location: [facility];  Service: Urology;  Laterality: Bilateral;  . HOLMIUM LASER APPLICATION Left 06/13/2977   Procedure: HOLMIUM LASER APPLICATION;  Surgeon: Alexis Frock, MD;  Location: [facility];  Service: Urology;  Laterality: Left;  . LITHOTRIPSY    . LITHOTRIPSY    . NEPHROLITHOTOMY Left 08/08/2013   Procedure: 1ST STAGE NEPHROLITHOTOMY PERCUTANEOUS WITH SURGEON ACCESS;  Surgeon: Alexis Frock, MD;  Location: [facility];  Service: Urology;  Laterality: Left;  . NEPHROLITHOTOMY Left 08/10/2013    Procedure: NEPHROLITHOTOMY PERCUTANEOUS SECOND LOOK/LEFT DIGITAL URETEROSCOPY/BASKETING OF STONE/EXCHANGE OF LEFT URETERAL STENT;  Surgeon: Alexis Frock, MD;  Location: [facility];  Service: Urology;  Laterality: Left;  . PERCUTANEOUS NEPHROSTOLITHOTOMY    . roux en y     [facility] (weight lost 155 lbs.) Stable -has gained 20 lbs over desired 250#  desired level  . TONSILLECTOMY     child  . TONSILLECTOMY    . TRACHEOSTOMY     '87- s/p tongue and nasal septum deviation and Sleep apnea surgery    Family History  Problem Relation Age of Onset  . Cancer Mother        Ovarian, died age 8.  . Diabetes Father   . Hypertension Father   . Heart failure Father        Died of MI age 24.  . Breast cancer Maternal Grandmother   . Colon cancer Maternal Grandfather   . Esophageal cancer Neg Hx   . Stomach cancer Neg Hx   . Rectal cancer Neg Hx    Social History:  reports that he quit smoking about 49 years ago. His smoking use included cigarettes. He has a 8.00 pack-year smoking history. he has never used smokeless tobacco. He reports that he does not drink alcohol or use drugs.  Allergies:  Allergies  Allergen Reactions  . Lisinopril Other (See Comments)    DIZZINESS    No medications prior to admission.    No results found for this or any previous visit (from the past 48 hour(s)). No results found.  Review of Systems  Constitutional: Negative.  Negative for chills and fever.  HENT: Negative.   Eyes: Negative.   Respiratory: Negative.   Cardiovascular: Negative.   Gastrointestinal: Negative.   Genitourinary: Negative.  Negative for dysuria, flank pain and hematuria.  Musculoskeletal: Negative.   Skin: Negative.   Neurological: Negative.   Psychiatric/Behavioral: Negative.     Height 5\' 10"  (1.778 m), weight (!) 142 kg (313 lb). Physical Exam  Constitutional: He appears well-developed.  HENT:  Head: Normocephalic.  Eyes: Pupils are equal, round, and reactive to  light.  Neck:  Trach with PMV in place.   Cardiovascular: Normal rate.  Respiratory: Effort normal.  GI:  Stable large truncal obesity.   Genitourinary:  Genitourinary Comments: NO CVAT at present.   Musculoskeletal: Normal range of motion.  Neurological: He is alert.  Skin: Skin is warm.  Psychiatric: He has a normal mood and affect.     Assessment/Plan  Proceed as planned with 1st stage bilateral Ureteroscopy with goal of prevnting progression to percutaneous approach surgery. Today we will focus more on left side. This may take 2-3 surgeries. Risks, benefits, alternaitves, expected peri-op course incluidng need for stents discussed previously and reiterated today.   Alexis Frock, MD 05/22/2017, 7:24 AM

## 2017-05-22 NOTE — Transfer of Care (Signed)
Immediate Anesthesia Transfer of Care Note  Patient: Dennis Spencer  Procedure(s) Performed: CYSTOSCOPY WITH RETROGRADE PYELOGRAM, URETEROSCOPY AND STENT PLACEMENT (Bilateral ) HOLMIUM LASER APPLICATION (Bilateral )  Patient Location: PACU  Anesthesia Type:General  Level of Consciousness: awake, alert  and oriented  Airway & Oxygen Therapy: Patient Spontanous Breathing and Patient connected to face mask oxygen  Post-op Assessment: Report given to RN and Post -op Vital signs reviewed and stable  Post vital signs: Reviewed and stable  Last Vitals:  Vitals:   05/22/17 1315  BP: (!) 163/82  Pulse: 69  Resp: 18  Temp: 36.7 C  SpO2: 100%    Last Pain:  Vitals:   05/22/17 1315  TempSrc: Oral         Complications: No apparent anesthesia complications

## 2017-05-22 NOTE — Brief Op Note (Signed)
05/22/2017  4:41 PM  PATIENT:  [name]  72 y.o. male  PRE-OPERATIVE DIAGNOSIS:  BILATERAL RENAL STONES  POST-OPERATIVE DIAGNOSIS:  BILATERAL RENAL STONES  PROCEDURE:  Procedure(s): CYSTOSCOPY WITH RETROGRADE PYELOGRAM, URETEROSCOPY AND STENT PLACEMENT (Bilateral) HOLMIUM LASER APPLICATION (Bilateral)  SURGEON:  Surgeon(s) and Role:    Alexis Frock, MD - Primary  PHYSICIAN ASSISTANT:   ASSISTANTS: none   ANESTHESIA:   general  EBL:  minimal  BLOOD ADMINISTERED:none  DRAINS: none   LOCAL MEDICATIONS USED:  NONE  SPECIMEN:  Source of Specimen:  bilateral renal stone fragmets  DISPOSITION OF SPECIMEN:  discard  COUNTS:  YES  TOURNIQUET:  * No tourniquets in log *  DICTATION: .Other Dictation: Dictation Number [id number]  PLAN OF CARE: Discharge to home after PACU  PATIENT DISPOSITION:  PACU - hemodynamically stable.   Delay start of Pharmacological VTE agent (>24hrs) due to surgical blood loss or risk of bleeding: yes

## 2017-05-22 NOTE — Progress Notes (Signed)
Trach Collar changed out per Dr. Lurline Del orders. Pt tolerated it well.

## 2017-05-22 NOTE — Anesthesia Preprocedure Evaluation (Addendum)
Anesthesia Evaluation  Patient identified by MRN, date of birth, ID band Patient awake    Reviewed: Allergy & Precautions, NPO status , Patient's Chart, lab work & pertinent test results  Airway Mallampati: Trach  TM Distance: >3 FB Neck ROM: Full    Dental  (+) Teeth Intact, Dental Advisory Given   Pulmonary sleep apnea (s/p tracheostomy) , former smoker,    Pulmonary exam normal breath sounds clear to auscultation       Cardiovascular hypertension, Pt. on medications Normal cardiovascular exam Rhythm:Regular Rate:Normal     Neuro/Psych negative neurological ROS  negative psych ROS   GI/Hepatic Neg liver ROS, GERD  Medicated,  Endo/Other  Morbid obesity  Renal/GU Renal InsufficiencyRenal disease     Musculoskeletal  (+) Arthritis , Osteoarthritis,    Abdominal   Peds  Hematology negative hematology ROS (+)   Anesthesia Other Findings Day of surgery medications reviewed with the patient.  Reproductive/Obstetrics                            Anesthesia Physical Anesthesia Plan  ASA: II  Anesthesia Plan: General   Post-op Pain Management:    Induction: Intravenous  PONV Risk Score and Plan: 3 and Ondansetron and Dexamethasone  Airway Management Planned: Tracheostomy  Additional Equipment:   Intra-op Plan:   Post-operative Plan: Extubation in OR  Informed Consent: I have reviewed the patients History and Physical, chart, labs and discussed the procedure including the risks, benefits and alternatives for the proposed anesthesia with the patient or authorized representative who has indicated his/her understanding and acceptance.   Dental advisory given  Plan Discussed with: CRNA  Anesthesia Plan Comments: (Risks/benefits of general anesthesia discussed with patient including risk of damage to teeth, lips, gum, and tongue, nausea/vomiting, allergic reactions to medications, and the  possibility of heart attack, stroke and death.  All patient questions answered.  Patient wishes to proceed.  Patient with Shiley 6.0 CFS trach in place.  Will plan for IV induction, exchange of trach for reinforced 6.63mm ETT inserted through trach stoma.  Have new Shiley 6.0 CFS in OR.)     Anesthesia Quick Evaluation

## 2017-05-22 NOTE — Anesthesia Procedure Notes (Signed)
Procedure Name: Intubation Performed by: Gean Maidens, CRNA Pre-anesthesia Checklist: Patient identified, Emergency Drugs available, Suction available, Patient being monitored and Timeout performed Patient Re-evaluated:Patient Re-evaluated prior to induction Oxygen Delivery Method: Circle system utilized Preoxygenation: Pre-oxygenation with 100% oxygen Induction Type: IV induction Ventilation: Mask ventilation without difficulty Tube type: Reinforced Tube size: 6.0 mm Number of attempts: 1 Placement Confirmation: positive ETCO2,  CO2 detector and breath sounds checked- equal and bilateral Secured at: 16 cm Tube secured with: Tape Dental Injury: Teeth and Oropharynx as per pre-operative assessment  Comments: Following induction, Tracheal sheath removed, 6.46mm reinforced ETT inserted into tracheal stoma site. Performed by Dr. Gifford Shave.

## 2017-05-24 NOTE — Anesthesia Postprocedure Evaluation (Signed)
Anesthesia Post Note  Patient: Dennis Spencer  Procedure(s) Performed: CYSTOSCOPY WITH RETROGRADE PYELOGRAM, URETEROSCOPY AND STENT PLACEMENT (Bilateral ) HOLMIUM LASER APPLICATION (Bilateral )     Patient location during evaluation: PACU Anesthesia Type: General Level of consciousness: awake and alert Pain management: pain level controlled Vital Signs Assessment: post-procedure vital signs reviewed and stable Respiratory status: spontaneous breathing, nonlabored ventilation and respiratory function stable Cardiovascular status: blood pressure returned to baseline and stable Postop Assessment: no apparent nausea or vomiting Anesthetic complications: no    Last Vitals:  Vitals:   05/22/17 1737 05/22/17 1835  BP: (!) 176/97 (!) 174/70  Pulse: 74 80  Resp: 13 16  Temp: 36.5 C   SpO2: 95% 95%    Last Pain:  Vitals:   05/22/17 1835  TempSrc:   PainSc: [place]

## 2017-05-25 ENCOUNTER — Encounter (HOSPITAL_COMMUNITY): Payer: Self-pay | Admitting: Urology

## 2017-05-25 NOTE — Op Note (Signed)
NAMEMICHALE, EMMERICH NO.:  [id number]  MEDICAL RECORD NO.:  [id number]  LOCATION:                                 FACILITY:  PHYSICIAN:  Alexis Frock, MD          DATE OF BIRTH:  DATE OF PROCEDURE:  05/22/2017                               OPERATIVE REPORT   PREOPERATIVE DIAGNOSIS:  Left greater than right recurrent renal stones.  PROCEDURE: 1. Cystoscopy with bilateral retrograde pyelogram with interpretation. 2. Bilateral ureteroscopy with laser lithotripsy for stage. 3. Insertion of bilateral ureteral stents, 5 x 26 polaris, no tether.  ESTIMATED BLOOD LOSS:  Nil.  COMPLICATION:  None.  SPECIMEN:  Bilateral renal stone fragments for discard.  FINDINGS: 1. Mild right caliectasis on retrograde pyelogram. 2. Left hydronephrosis with obstructing UPJ stone. 3. Left retrograde pyelogram. 4. 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 5. Small volume right intrarenal stones papillary tip, total volume     approximately 1 sq cm. 6. Dusting ablation of approximately 50% volume of left renal stone. 7. Ureteroscopy and lithotripsy approximately 80% to 90% of right-     sided renal stone. 8. Successful placement of bilateral ureteral stents, proximal in the     renal pelvis and distal in the urinary bladder.  INDICATION:  Dennis Spencer is a pleasant 72 year old gentleman with history of morbid obesity and is status post Roux-en-Y gastric bypass surgery. He has longstanding history of nephrolithiasis that has been significantly worse following his gastric bypass procedure due to enteric hyperoxaluria.  He has had multiple stone procedures including percutaneous nephrostolithotomy several years ago.  He has been on medical therapy and surveillance of the stone since then.  He has had a slow recurrence of stones, left greater than right.  We have discussed management options including continued observation versus preemptive ureteroscopy with goal  of stone free in order to prevent progression to percutaneous surgery again and he wished to proceed with staged ureteroscopy.  First stage today.  Informed consent was obtained and placed in the medical record.  PROCEDURE IN DETAIL:  [name] was verified. Procedure being bilateral first stage ureteroscopic stone manipulation was confirmed.  Procedure was carried out.  Time-out was performed. Intravenous antibiotics were administered.  General anesthesia was introduced.  The patient was placed into a low lithotomy position. Sterile field was created by prepping and draping the patient's penis, perineum, proximal thighs using iodine.  Cystourethroscopy was performed using a 22-French rigid cystoscope with offset lens.  Inspection of the anterior and posterior urethra were unremarkable.  Inspection of urinary bladder revealed no diverticula, calcifications, papillary lesions. Ureteral orifices were singleton bilaterally.  The right ureteral orifice was cannulated with a 6-French end-hole catheter and right retrograde pyelogram was obtained.  Right retrograde pyelogram demonstrated a single right ureter with single-system right kidney.  There were no filling defects or narrowing noted.  There was mild caliectasis noted.  A 0.03 Zip wire was advanced to the level of the upper pole, set aside as a safety wire.  Similarly, left retrograde pyelogram was obtained.  Left retrograde pyelogram demonstrated a single left ureter with single- system left kidney.  There was significant hydronephrosis with a filling defect in the area of the UPJ consistent with likely progression of known renal stone to obstructing stone.  A 0.03 Zip wire was advanced to the upper pole, set aside as a safety wire.  An 8-French feeding tube was placed in the urinary bladder for pressure release.  Semi-rigid ureteroscopy was performed of the distal four-fifths of the right ureter alongside a  separate Sensor working wire.  No mucosal abnormalities were found.  Sensor wire was left in place.  Similarly, semi-rigid ureteroscopy was performed in the distal orifice of the left ureter alongside a separate Sensor working wire.  No mucosal abnormalities were found.  Semi-rigid scope was then exchanged for a 12/14, 36-cm ureteral access sheath at the level of left proximal ureter using continuous fluoroscopic guidance and flexible digital ureteroscopy was performed on the left side.  As anticipated, there was a very large obstructing stone at the area of the UPJ on the left side.  This appeared to be much too large for simple basketing.  As such, holmium laser energy applied to the stone using settings of 0.3 joules and 30 hertz, and dusting technique was used to ablate approximately 70% of this stone and this allowed visualization of the area of the renal pelvis.  There were additional multiple large stones noted.  Total volume at least 2.5 sq cm.  Additional ablation technique was used on these intrarenal stones for total ablation approximately 50% of the stone on the left side following an hour and 15 minutes of laser lithotripsy.  This technique given the large volume stone, visualization was somewhat poor and it was felt that we have completed the most safe first stage effort on the left side.  As such, the left access sheath was removed under continuous vision.  No mucosal abnormalities were found.  Similarly, the access sheath was placed up the right ureter alongside the right Sensor working wire to level of the proximal right ureter and systematic inspection of the right proximal ureter and kidney including all calices x3 with flexible ureteroscopy.  There were multiple small volume papillary tip calcifications noted for total volume approximately 1 sq cm.  Several of these were too large for simple basketing.  Holmium laser energy applied to the stone using settings of 0.3  joules and 30 Hz, fragmenting the stone and the pieces that were amenable to basketing, an Escape basket was used to grasp these fragments, removing them on the right side.  Thus, all stone fragments larger than 1/3rd millimeter had been removed on the right. The access sheath was removed under continuous vision.  No mucosal abnormalities were found.  Given the bilateral length of procedure today, it was clearly felt that stenting would be warranted.  As such, a new 5 x 26 Contour-type stent was placed on the right side over the remaining safety wire using cystoscopic and fluoroscopic guidance.  Good proximal and distal deployment were noted.  Similarly, a separate 5 x 26 Polaris-type stent was placed over the left safety wire using cystoscopic and fluoroscopic guidance.  Good proximal and distal deployment were noted.  Bladder was emptied per cystoscope.  Procedure was then terminated.  The patient tolerated procedure well.  There were no immediate periprocedural complications.  The patient was taken to the postanesthesia care in stable condition.  He is already scheduled for second-stage procedure in several weeks.          ______________________________ Alexis Frock, MD    TM/MEDQ  D:  05/22/2017  T:  05/23/2017  Job:  [id number]

## 2017-05-29 ENCOUNTER — Other Ambulatory Visit: Payer: Self-pay

## 2017-05-29 ENCOUNTER — Encounter (HOSPITAL_COMMUNITY): Payer: Self-pay | Admitting: *Deleted

## 2017-06-04 MED ORDER — GENTAMICIN SULFATE 40 MG/ML IJ SOLN
5.0000 mg/kg | INTRAVENOUS | Status: AC
  Administered 2017-06-05: 502 mg via INTRAVENOUS
  Filled 2017-06-04: qty 12.5

## 2017-06-05 ENCOUNTER — Other Ambulatory Visit: Payer: Self-pay

## 2017-06-05 ENCOUNTER — Ambulatory Visit (HOSPITAL_COMMUNITY): Payer: Medicare HMO

## 2017-06-05 ENCOUNTER — Encounter (HOSPITAL_COMMUNITY)
Admission: RE | Disposition: A | Discharge status: Home / Self Care | Payer: Self-pay | Source: Ambulatory Visit | Attending: Urology

## 2017-06-05 ENCOUNTER — Encounter (HOSPITAL_COMMUNITY): Payer: Self-pay | Admitting: Emergency Medicine

## 2017-06-05 ENCOUNTER — Ambulatory Visit (HOSPITAL_COMMUNITY)
Admission: RE | Admit: 2017-06-05 | Discharge: 2017-06-05 | Disposition: A | Discharge status: Home / Self Care | Payer: Medicare HMO | Source: Ambulatory Visit | Attending: Urology | Admitting: Urology

## 2017-06-05 ENCOUNTER — Ambulatory Visit (HOSPITAL_COMMUNITY): Payer: Medicare HMO | Admitting: Certified Registered Nurse Anesthetist

## 2017-06-05 DIAGNOSIS — N200 Calculus of kidney: Secondary | ICD-10-CM | POA: Diagnosis not present

## 2017-06-05 DIAGNOSIS — Z87891 Personal history of nicotine dependence: Secondary | ICD-10-CM | POA: Diagnosis not present

## 2017-06-05 DIAGNOSIS — E7800 Pure hypercholesterolemia, unspecified: Secondary | ICD-10-CM | POA: Diagnosis not present

## 2017-06-05 DIAGNOSIS — Z6841 Body Mass Index (BMI) 40.0 and over, adult: Secondary | ICD-10-CM | POA: Diagnosis not present

## 2017-06-05 DIAGNOSIS — Z9884 Bariatric surgery status: Secondary | ICD-10-CM | POA: Insufficient documentation

## 2017-06-05 DIAGNOSIS — N183 Chronic kidney disease, stage 3 (moderate): Secondary | ICD-10-CM | POA: Diagnosis not present

## 2017-06-05 DIAGNOSIS — G4730 Sleep apnea, unspecified: Secondary | ICD-10-CM | POA: Insufficient documentation

## 2017-06-05 DIAGNOSIS — Z87442 Personal history of urinary calculi: Secondary | ICD-10-CM | POA: Diagnosis not present

## 2017-06-05 DIAGNOSIS — Z79899 Other long term (current) drug therapy: Secondary | ICD-10-CM | POA: Diagnosis not present

## 2017-06-05 DIAGNOSIS — K219 Gastro-esophageal reflux disease without esophagitis: Secondary | ICD-10-CM | POA: Insufficient documentation

## 2017-06-05 DIAGNOSIS — I129 Hypertensive chronic kidney disease with stage 1 through stage 4 chronic kidney disease, or unspecified chronic kidney disease: Secondary | ICD-10-CM | POA: Insufficient documentation

## 2017-06-05 DIAGNOSIS — Z888 Allergy status to other drugs, medicaments and biological substances status: Secondary | ICD-10-CM | POA: Diagnosis not present

## 2017-06-05 DIAGNOSIS — N202 Calculus of kidney with calculus of ureter: Secondary | ICD-10-CM | POA: Insufficient documentation

## 2017-06-05 DIAGNOSIS — Z930 Tracheostomy status: Secondary | ICD-10-CM | POA: Insufficient documentation

## 2017-06-05 DIAGNOSIS — Z7982 Long term (current) use of aspirin: Secondary | ICD-10-CM | POA: Diagnosis not present

## 2017-06-05 HISTORY — PX: CYSTOSCOPY WITH RETROGRADE PYELOGRAM, URETEROSCOPY AND STENT PLACEMENT: SHX5789

## 2017-06-05 SURGERY — CYSTOURETEROSCOPY, WITH RETROGRADE PYELOGRAM AND STENT INSERTION
Anesthesia: General | Laterality: Bilateral

## 2017-06-05 MED ORDER — FENTANYL CITRATE (PF) 100 MCG/2ML IJ SOLN
INTRAMUSCULAR | Status: DC | PRN
  Administered 2017-06-05 (×4): 50 ug via INTRAVENOUS

## 2017-06-05 MED ORDER — SODIUM CHLORIDE 0.9 % IR SOLN
Status: DC | PRN
  Administered 2017-06-05 (×2): 3000 mL

## 2017-06-05 MED ORDER — DEXAMETHASONE SODIUM PHOSPHATE 10 MG/ML IJ SOLN
INTRAMUSCULAR | Status: AC
  Filled 2017-06-05: qty 1

## 2017-06-05 MED ORDER — ONDANSETRON HCL 4 MG/2ML IJ SOLN
INTRAMUSCULAR | Status: AC
  Filled 2017-06-05: qty 2

## 2017-06-05 MED ORDER — LACTATED RINGERS IV SOLN
INTRAVENOUS | Status: DC
  Administered 2017-06-05: 13:00:00 via INTRAVENOUS

## 2017-06-05 MED ORDER — LIDOCAINE 2% (20 MG/ML) 5 ML SYRINGE
INTRAMUSCULAR | Status: AC
  Filled 2017-06-05: qty 5

## 2017-06-05 MED ORDER — KETOROLAC TROMETHAMINE 10 MG PO TABS: 10.0000 mg | ORAL_TABLET | Freq: Three times a day (TID) | ORAL | 0 refills | Status: DC | PRN

## 2017-06-05 MED ORDER — IOHEXOL 300 MG/ML  SOLN
INTRAMUSCULAR | Status: DC | PRN
  Administered 2017-06-05: 15 mL

## 2017-06-05 MED ORDER — ONDANSETRON HCL 4 MG/2ML IJ SOLN
INTRAMUSCULAR | Status: DC | PRN
  Administered 2017-06-05: 4 mg via INTRAVENOUS

## 2017-06-05 MED ORDER — PROPOFOL 10 MG/ML IV BOLUS
INTRAVENOUS | Status: DC | PRN
  Administered 2017-06-05: 180 mg via INTRAVENOUS

## 2017-06-05 MED ORDER — 0.9 % SODIUM CHLORIDE (POUR BTL) OPTIME
TOPICAL | Status: DC | PRN
  Administered 2017-06-05: 1000 mL

## 2017-06-05 MED ORDER — DEXAMETHASONE SODIUM PHOSPHATE 4 MG/ML IJ SOLN
INTRAMUSCULAR | Status: DC | PRN
  Administered 2017-06-05: 10 mg via INTRAVENOUS

## 2017-06-05 MED ORDER — FENTANYL CITRATE (PF) 100 MCG/2ML IJ SOLN
INTRAMUSCULAR | Status: AC
  Filled 2017-06-05: qty 2

## 2017-06-05 MED ORDER — TRAMADOL HCL 50 MG PO TABS: 50.0000 mg | ORAL_TABLET | Freq: Four times a day (QID) | ORAL | 0 refills | Status: DC | PRN

## 2017-06-05 MED ORDER — LIDOCAINE 2% (20 MG/ML) 5 ML SYRINGE
INTRAMUSCULAR | Status: DC | PRN
  Administered 2017-06-05: 100 mg via INTRAVENOUS

## 2017-06-05 SURGICAL SUPPLY — 27 items
BAG URO CATCHER STRL LF (MISCELLANEOUS) ×3 IMPLANT
BASKET LASER NITINOL 1.9FR (BASKET) ×2 IMPLANT
BSKT STON RTRVL 120 1.9FR (BASKET) ×1
CATH INTERMIT  6FR 70CM (CATHETERS) ×3 IMPLANT
CLOTH BEACON ORANGE TIMEOUT ST (SAFETY) ×3 IMPLANT
COVER FOOTSWITCH UNIV (MISCELLANEOUS) IMPLANT
COVER SURGICAL LIGHT HANDLE (MISCELLANEOUS) ×3 IMPLANT
FIBER LASER FLEXIVA 1000 (UROLOGICAL SUPPLIES) IMPLANT
FIBER LASER FLEXIVA 365 (UROLOGICAL SUPPLIES) IMPLANT
FIBER LASER FLEXIVA 550 (UROLOGICAL SUPPLIES) IMPLANT
FIBER LASER TRAC TIP (UROLOGICAL SUPPLIES) IMPLANT
GLOVE BIOGEL M STRL SZ7.5 (GLOVE) ×3 IMPLANT
GOWN STRL REUS W/TWL LRG LVL3 (GOWN DISPOSABLE) ×6 IMPLANT
GUIDEWIRE ANG ZIPWIRE 038X150 (WIRE) ×6 IMPLANT
GUIDEWIRE STR DUAL SENSOR (WIRE) ×5 IMPLANT
MANIFOLD NEPTUNE II (INSTRUMENTS) ×3 IMPLANT
PACK CYSTO (CUSTOM PROCEDURE TRAY) ×3 IMPLANT
SHEATH ACCESS URETERAL 24CM (SHEATH) IMPLANT
SHEATH ACCESS URETERAL 54CM (SHEATH) IMPLANT
SHEATH URETERAL 12FRX35CM (MISCELLANEOUS) ×2 IMPLANT
STENT POLARIS 5FRX26 (STENTS) ×4 IMPLANT
SYR CONTROL 10ML LL (SYRINGE) ×3 IMPLANT
SYRINGE IRR TOOMEY STRL 70CC (SYRINGE) ×3 IMPLANT
TUBE FEEDING 8FR 16IN STR KANG (MISCELLANEOUS) ×3 IMPLANT
TUBING CONNECTING 10 (TUBING) ×2 IMPLANT
TUBING CONNECTING 10' (TUBING) ×1
TUBING UROLOGY SET (TUBING) ×2 IMPLANT

## 2017-06-05 NOTE — Anesthesia Preprocedure Evaluation (Signed)
Anesthesia Evaluation  Patient identified by MRN, date of birth, ID band Patient awake    Reviewed: Allergy & Precautions, NPO status , Patient's Chart, lab work & pertinent test results  Airway Mallampati: II  TM Distance: >3 FB Neck ROM: Full    Dental no notable dental hx.    Pulmonary sleep apnea , former smoker,  Therapist, nutritional   Pulmonary exam normal breath sounds clear to auscultation       Cardiovascular hypertension, negative cardio ROS Normal cardiovascular exam Rhythm:Regular Rate:Normal     Neuro/Psych negative neurological ROS  negative psych ROS   GI/Hepatic negative GI ROS, Neg liver ROS,   Endo/Other  negative endocrine ROS  Renal/GU negative Renal ROS  negative genitourinary   Musculoskeletal negative musculoskeletal ROS (+)   Abdominal   Peds  Hematology negative hematology ROS (+)   Anesthesia Other Findings   Reproductive/Obstetrics                             Anesthesia Physical Anesthesia Plan  ASA: III  Anesthesia Plan: General   Post-op Pain Management:    Induction: Intravenous  PONV Risk Score and Plan: Treatment may vary due to age or medical condition  Airway Management Planned: Tracheostomy  Additional Equipment:   Intra-op Plan:   Post-operative Plan: Extubation in OR  Informed Consent: I have reviewed the patients History and Physical, chart, labs and discussed the procedure including the risks, benefits and alternatives for the proposed anesthesia with the patient or authorized representative who has indicated his/her understanding and acceptance.     Plan Discussed with: CRNA  Anesthesia Plan Comments:         Anesthesia Quick Evaluation

## 2017-06-05 NOTE — Anesthesia Postprocedure Evaluation (Signed)
Anesthesia Post Note  Patient: Dennis Spencer  Procedure(s) Performed: CYSTOSCOPY WITH RETROGRADE PYELOGRAM, URETEROSCOPY AND STENT PLACEMENT (Bilateral )     Patient location during evaluation: PACU Anesthesia Type: General Level of consciousness: awake and alert Pain management: pain level controlled Vital Signs Assessment: post-procedure vital signs reviewed and stable Respiratory status: spontaneous breathing, nonlabored ventilation, respiratory function stable and patient connected to nasal cannula oxygen Cardiovascular status: blood pressure returned to baseline and stable Postop Assessment: no apparent nausea or vomiting Anesthetic complications: no    Last Vitals:  Vitals:   06/05/17 1658 06/05/17 1745  BP: 137/65 (!) 167/66  Pulse: 70 87  Resp: 16   Temp:  (!) 36.4 C  SpO2: 98% 98%    Last Pain:  Vitals:   06/05/17 1719  TempSrc:   PainSc: 0-No pain                 Barnet Glasgow

## 2017-06-05 NOTE — Anesthesia Procedure Notes (Signed)
Procedure Name: Intubation Date/Time: 06/05/2017 2:20 PM Performed by: Claudia Desanctis, CRNA Pre-anesthesia Checklist: Patient identified, Emergency Drugs available, Suction available and Patient being monitored Patient Re-evaluated:Patient Re-evaluated prior to induction Oxygen Delivery Method: Circle system utilized Preoxygenation: Pre-oxygenation with 100% oxygen Induction Type: IV induction Ventilation: Mask ventilation without difficulty Number of attempts: 1 Airway Equipment and Method: Stylet Placement Confirmation: positive ETCO2 and breath sounds checked- equal and bilateral Tube secured with: Tape Dental Injury: Teeth and Oropharynx as per pre-operative assessment  Comments: Pt has existing 6 trach with long term stoma. Easy mask ventalation after induction and trach cap and than trach removed.   #6 reinforced ett easily passed thru stoma.

## 2017-06-05 NOTE — Transfer of Care (Signed)
Immediate Anesthesia Transfer of Care Note  Patient: Dennis Spencer  Procedure(s) Performed: CYSTOSCOPY WITH RETROGRADE PYELOGRAM, URETEROSCOPY AND STENT PLACEMENT (Bilateral )  Patient Location: PACU  Anesthesia Type:General  Level of Consciousness: awake, alert , oriented and patient cooperative  Airway & Oxygen Therapy: Patient Spontanous Breathing and Patient connected to face mask  Post-op Assessment: Report given to RN and Post -op Vital signs reviewed and stable  Post vital signs: Reviewed and stable  Last Vitals:  Vitals:   06/05/17 1226  BP: (!) 173/72  Pulse: 78  Resp: 16  Temp: 37.6 C  SpO2: 98%    Last Pain:  Vitals:   06/05/17 1249  TempSrc:   PainSc: 0-No pain      Patients Stated Pain Goal: 4 (09/62/83 6629)  Complications: No apparent anesthesia complications

## 2017-06-05 NOTE — Brief Op Note (Signed)
06/05/2017  3:41 PM  PATIENT:  Dennis Spencer  72 y.o. male  PRE-OPERATIVE DIAGNOSIS:  BILATERAL RENAL STONES  POST-OPERATIVE DIAGNOSIS:  BILATERAL RENAL STONES  PROCEDURE:  Procedure(s): CYSTOSCOPY WITH RETROGRADE PYELOGRAM, URETEROSCOPY AND STENT PLACEMENT (Bilateral)  SURGEON:  Surgeon(s) and Role:    Alexis Frock, MD - Primary  PHYSICIAN ASSISTANT:   ASSISTANTS: none   ANESTHESIA:   general  EBL:  none   BLOOD ADMINISTERED:none  DRAINS: none   LOCAL MEDICATIONS USED:  NONE  SPECIMEN:  Source of Specimen:  bilateral uretera / renal stone fragments  DISPOSITION OF SPECIMEN:  discard  COUNTS:  YES  TOURNIQUET:  * No tourniquets in log *  DICTATION: .Other Dictation: Dictation Number  [id number]  PLAN OF CARE: Discharge to home after PACU  PATIENT DISPOSITION:  PACU - hemodynamically stable.   Delay start of Pharmacological VTE agent (>24hrs) due to surgical blood loss or risk of bleeding: yes

## 2017-06-05 NOTE — H&P (Signed)
Urology Admission H&P  Chief Complaint: Pre-op BILATERAL 2nd stage Ureteroscopic stone manipulation  History of Present Illness:    1 - Recurrent Surgical Nephrolithiasis -   2011 - PCNL for large tones  2015 - Rt URS / Lt PCNL for huge volume left renal and bilateral ureteral stones, Total stone volume >6cm2    Recent Surveillance:   04/2014 - KUB, Renal US - ? tiny RLP and LUP stones by [place] alone, no hydro. KUB clear  11/2014 - [facility], Renal US - likely 4mm Rt sided stones x2, no hydro.  03/2016 - [facility], Renal US, CMP - several 3-4 mm scattered non-obstructing stones bilaterally, ?65mm left x 1. Cr <1.5 and lytes normal by PCP labs.  03/2017 -= KUB, RUS, CMP - progression of left lower pole stone, now approx 2.5cm and bilateral intra-renal stones, normal kidney function and electrolytes.   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 Currently on CaCO4 daily, though only BID and rel low-dose.   Eval 2011 - 24 hr urine - severe hyperoxaluria >170   Eval 2015 - BMP,PTH,Urate - normal, Composition - CaOx; 24 hr Urines - high oxalate (60), low citrate (160) --> CaCitrate 2080 MEQ TID meals   PMH sig for morbid obesity, trach (since 1980s). Denies ischemic heart disease. His PCP is Claretta Fraise MD with [facility].    Today Ewin is seen to proceed with 3nd stage stage bilateral ureteroscopic stone manipulation. No interval fevers. Most recent UCX negative. He is s/p 1st stage procedure few weeks ago.     Past Medical History:  Diagnosis Date  . Arthritis   . CKD (chronic kidney disease), stage III ([place]) 11/10/2013  . Gallstones   . GERD (gastroesophageal reflux disease)   . 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  . Hypercholesteremia   . Hypertension   . Kidney stones   . Osteomyelitis ([place])    LLE age 55  .  Pneumonia 6-7 YRS AGO  . 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 Severe obesity (BMI >= 40) ([place]) 11/12/2013  . Sleep apnea    s/p tracheostomy, LEAVES TRACH OPEN AT NIGHT FOR SLEEP APNEA   Past Surgical History:  Procedure Laterality Date  . CHOLECYSTECTOMY N/A 06/15/2015   Procedure: LAPAROSCOPIC CHOLECYSTECTOMY;  Surgeon: Ralene Ok, MD;  Location: [facility];  Service: General;  Laterality: N/A;  . COLONOSCOPY    . CYSTOSCOPY WITH RETROGRADE PYELOGRAM, URETEROSCOPY AND STENT PLACEMENT Bilateral 06/18/2013   Procedure: CYSTOSCOPY WITH RETROGRADE PYELOGRAM, Left URETEROSCOPY with laser , AND bilateral STENT PLACEMENT;  Surgeon: Alexis Frock, MD;  Location: [facility];  Service: Urology;  Laterality: Bilateral;  . CYSTOSCOPY WITH RETROGRADE PYELOGRAM, URETEROSCOPY AND STENT PLACEMENT Right 08/08/2013   Procedure: CYSTOSCOPY WITH RIGHT RETROGRADE PYELOGRAM, URETEROSCOPY AND STENT PLACEMENT, stone extraction;  Surgeon: Alexis Frock, MD;  Location: [facility];  Service: Urology;  Laterality: Right;  . CYSTOSCOPY WITH RETROGRADE PYELOGRAM, URETEROSCOPY AND STENT PLACEMENT Right 08/10/2013   Procedure: 2ND STAGE CYSTOSCOPY WITH RETROGRADE PYELOGRAM, URETEROSCOPY AND STENT EXCHANGE;  Surgeon: Alexis Frock, MD;  Location: [facility];  Service: Urology;  Laterality: Right;  . CYSTOSCOPY WITH RETROGRADE PYELOGRAM, URETEROSCOPY AND STENT PLACEMENT Bilateral 05/22/2017   Procedure: CYSTOSCOPY WITH RETROGRADE PYELOGRAM, URETEROSCOPY AND STENT PLACEMENT;  Surgeon: Alexis Frock,  MD;  Location: [facility];  Service: Urology;  Laterality: Bilateral;  . GASTRIC BYPASS     2004  . HOLMIUM LASER APPLICATION Bilateral 0/01/9832   Procedure: HOLMIUM LASER APPLICATION;  Surgeon: Alexis Frock, MD;  Location: [facility];  Service: Urology;  Laterality: Bilateral;  . HOLMIUM LASER APPLICATION Left 12/12/5051   Procedure: HOLMIUM LASER APPLICATION;  Surgeon: Alexis Frock,  MD;  Location: [facility];  Service: Urology;  Laterality: Left;  . HOLMIUM LASER APPLICATION Bilateral 9/76/7341   Procedure: HOLMIUM LASER APPLICATION;  Surgeon: Alexis Frock, MD;  Location: [facility];  Service: Urology;  Laterality: Bilateral;  . LITHOTRIPSY    . LITHOTRIPSY    . NEPHROLITHOTOMY Left 08/08/2013   Procedure: 1ST STAGE NEPHROLITHOTOMY PERCUTANEOUS WITH SURGEON ACCESS;  Surgeon: Alexis Frock, MD;  Location: [facility];  Service: Urology;  Laterality: Left;  . NEPHROLITHOTOMY Left 08/10/2013   Procedure: NEPHROLITHOTOMY PERCUTANEOUS SECOND LOOK/LEFT DIGITAL URETEROSCOPY/BASKETING OF STONE/EXCHANGE OF LEFT URETERAL STENT;  Surgeon: Alexis Frock, MD;  Location: [facility];  Service: Urology;  Laterality: Left;  . PERCUTANEOUS NEPHROSTOLITHOTOMY    . roux en y     [facility] (weight lost 155 lbs.) Stable -has gained 20 lbs over desired 250#  desired level  . TONSILLECTOMY     child  . TONSILLECTOMY    . TRACHEOSTOMY     '87- s/p tongue and nasal septum deviation and Sleep apnea surgery    Home Medications:  Current Facility-Administered Medications  Medication Dose Route Frequency Provider Last Rate Last Dose  . gentamicin (GARAMYCIN) 500 mg in dextrose 5 % 100 mL IVPB  5 mg/kg (Adjusted) Intravenous 30 min Pre-Op Dara Hoyer, [facility]       Current Outpatient Medications  Medication Sig Dispense Refill Last Dose  . amLODipine (NORVASC) 5 MG tablet TAKE 1 TABLET BY MOUTH EVERY DAY IN THE MORNING (Patient taking differently: Take 5 mg by mouth daily. ) 90 tablet 1 05/22/2017 at 0700  . aspirin EC 81 MG tablet Take 81 mg by mouth every morning.    05/19/2017  . atorvastatin (LIPITOR) 80 MG tablet TAKE 1 TABLET (80 MG TOTAL) BY MOUTH DAILY AT 6 PM. (Patient taking differently: Take 80 mg by mouth daily at 6 PM. ) 90 tablet 1 05/21/2017 at 2330  . b complex vitamins tablet Take 1 tablet by mouth daily.   Past Month at Unknown time  . calcium citrate-vitamin D (CITRACAL+D) 315-200  MG-UNIT per tablet Take 1 tablet by mouth 2 (two) times daily.    05/20/2017  . Cholecalciferol (VITAMIN D3) 2000 UNITS capsule Take 2,000 Units by mouth 2 (two) times daily.    Past Month at Unknown time  . gabapentin (NEURONTIN) 600 MG tablet Take 0.5 tablets (300 mg total) by mouth daily. (Patient taking differently: Take 300 mg by mouth at bedtime. Takes 0.5 tablet) 45 tablet 1 05/21/2017 at 2330  . ketorolac (TORADOL) 10 MG tablet Take 1 tablet (10 mg total) by mouth every 8 (eight) hours as needed. For mild pain / stent discomfort post-operatively. Pt has used previously. 20 tablet 0   . Multiple Vitamin (MULTIVITAMIN WITH MINERALS) TABS tablet Take 1 tablet by mouth daily.   Past Month at Unknown time  . naproxen sodium (ALEVE) 220 MG tablet Take 440 mg by mouth 2 (two) times daily with a meal.    05/19/2017  . omeprazole (PRILOSEC) 20 MG capsule TAKE 1 CAPSULE (20 MG TOTAL) BY MOUTH DAILY. 90 capsule 3 05/22/2017 at 0700  .  ranitidine (ZANTAC) 150 MG tablet Take 1 tablet (150 mg total) by mouth at bedtime. 90 tablet 1 05/21/2017 at 2330  . traMADol (ULTRAM) 50 MG tablet Take 1-2 tablets (50-100 mg total) by mouth every 6 (six) hours as needed for moderate pain or severe pain. Post-operatively 30 tablet 0   . vitamin C (ASCORBIC ACID) 500 MG tablet Take 500 mg by mouth 2 (two) times daily.   Past Month at Unknown time   Allergies:  Allergies  Allergen Reactions  . Lisinopril Other (See Comments)    DIZZINESS    Family History  Problem Relation Age of Onset  . Cancer Mother        Ovarian, died age 55.  . Diabetes Father   . Hypertension Father   . Heart failure Father        Died of MI age 58.  . Breast cancer Maternal Grandmother   . Colon cancer Maternal Grandfather   . Esophageal cancer Neg Hx   . Stomach cancer Neg Hx   . Rectal cancer Neg Hx    Social History:  reports that he quit smoking about 49 years ago. His smoking use included cigarettes. He has a 8.00 pack-year smoking  history. he has never used smokeless tobacco. He reports that he does not drink alcohol or use drugs.  Review of Systems  Constitutional: Negative.  Negative for chills and fever.  HENT: Negative.   Eyes: Negative.   Respiratory: Negative.   Cardiovascular: Negative.   Gastrointestinal: Negative.   Genitourinary: Positive for urgency.  Musculoskeletal: Negative.   Skin: Negative.   Neurological: Negative.   Endo/Heme/Allergies: Negative.   Psychiatric/Behavioral: Negative.     Physical Exam:  Vital signs in last 24 hours:   Physical Exam  Constitutional: He appears well-developed.  HENT:  Head: Normocephalic.  Eyes: Pupils are equal, round, and reactive to light.  Neck: Normal range of motion.  Trach in place, stable with PMV  Cardiovascular: Normal rate.  Respiratory: Effort normal.  GI:  Stable truncal obesity  Genitourinary:  Genitourinary Comments: No CVAT. Prior PCNL scars noted  Neurological: He is alert.  Skin: Skin is warm.  Psychiatric: He has a normal mood and affect.      Plan:   Proceed as planned with BILATERAL 2nd stage ureteroscopy. Risks, benefits, alternatives, expected peri-op course discussed previously and reiterated today. He may very well require 3rd stage pending findings today.    Debroah Shuttleworth 06/05/2017, 7:09 AM

## 2017-06-05 NOTE — Discharge Instructions (Signed)
1 - You may have urinary urgency (bladder spasms) and bloody urine on / off with stent in place. This is normal. ° °2 - Call MD or go to ER for fever >102, severe pain / nausea / vomiting not relieved by medications, or acute change in medical status ° °

## 2017-06-06 ENCOUNTER — Encounter (HOSPITAL_COMMUNITY): Payer: Self-pay | Admitting: Urology

## 2017-06-08 NOTE — Op Note (Signed)
Dennis Spencer, Dennis Spencer NO.:  [id number]  MEDICAL RECORD NO.:  [id number]  LOCATION:                                 FACILITY:  PHYSICIAN:  Alexis Frock, MD          DATE OF BIRTH:  DATE OF PROCEDURE:  06/05/2017                              OPERATIVE REPORT   UROLOGY OPERATIVE NOTE  PREOPERATIVE DIAGNOSIS:  Left greater than right large volume renal stones with enteric hyperoxaluria.  PROCEDURE: 1. Cystoscopy with bilateral retrograde pyelogram and interpretation. 2. Second stage bilateral ureteroscopy with basketing of stone. 3. Exchange of bilateral ureteral stents, 5 x 26 polaris, no tether.  ESTIMATED BLOOD LOSS:  Nil.  COMPLICATION:  None.  SPECIMEN:  Bilateral renal and ureteral stone fragments for discard.  FINDINGS: 1. Minimal residual right renal and ureteral stone approximately 5 mm     total. 2. Complete resolution of all stone fragments on the right side larger     than 1/3rd mm following basket extraction. 3. Very large volume antegrade progression of left intrarenal stone to     left ureteral stones, at least 1.5 sq cm total volume stone removed     today. 4. Necessity for third stage procedure focusing on residual stone     volume on the left.  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  He is status post many procedures including percutaneous approach surgery.  He is on medical therapy, calcium carbonate with meals.  He is on surveillance of stones and had progression up to greater than 2 cm now on the left side.  Options were discussed including continued surveillance versus proceed with staged ureteroscopy in effort to prevent him from needing percutaneous approach surgery in the near future and he wished to proceed with the  latter, bilateral staged ureteroscopy.  He underwent first-stage procedure on May 22, 2017.  He presents today for second stage.  Informed consent was obtained and placed in the medical record.  PROCEDURE IN DETAIL:  [name] was verified. Procedure being second stage bilateral ureteroscopic stone manipulation was confirmed.  Procedure was carried out.  Time-out was performed. Intravenous antibiotics were administered.  General endotracheal anesthesia was introduced per his tracheostomy.  The patient was placed into a low lithotomy position.  Sterile field was created by prepping and draping the patient's penis, perineum, proximal thighs using iodine.  Cystourethroscopy was performed using a 22-French rigid cystoscope with offset lens.  Inspection of anterior and posterior urethra was unremarkable.  Inspection of bladder revealed distal end of bilateral stents in situ.  The distal end of the right stent was grasped, brought to the level of the urethral meatus.  A 0.038 Zip wire was advanced to the level of the upper pole and exchanged for an open-ended catheter and right retrograde pyelogram was obtained.  Right retrograde pyelogram demonstrated a single right ureter with single-system right kidney.  No filling defects or narrowing noted.  The Zip wire was once again advanced, set aside as a safety  wire. Similarly, the left stent was removed and left retrograde pyelogram was obtained.  Left retrograde pyelogram demonstrated single left ureter with single- system left kidney.  There were multifocal filling defects within the entire left ureter and likely antegrade progression of left renal stone fragments.  The Zip wire was advanced to the level of the upper pole, set aside as safety wire.  An 8-French feeding tube placed in urinary bladder for pressure release.  Semi-rigid ureteroscopy was performed in the distal left ureter alongside a separate Sensor  working wire.  There was as expected a near Steinstrasse left ureter with many many stone fragments, resulted from antegrade progression of his prior left renal stone fragmentation.  A basketing technique was used very carefully in a step technique from distal to superior for approximately 70 minutes.  Given the prolonged procedure ureteroscopy, at this point visualization became quite poor, but majority of the ureteral stone had been cleared as well, though clearly a third stage procedure would be required focusing on this side again.  Safety wire was left in place and semi-rigid ureteroscopy was performed to the distal four-fifths of the right ureter alongside a separate Sensor working wire.  No mucosal abnormalities were found. This was exchanged for the medium size access sheath at the level of proximal ureter using continuous fluoroscopic guidance and flexible digital ureteroscopy performed of the proximal right ureter and systematic inspection of the right kidney.  Very careful inspection revealed only a single dominant papillary tip calcification in upper midpole calyx.  This did appear amenable to simple basketing.  It was grasped with the Escape basket, removed and set aside for compositional analysis.  Following this, there was complete resolution of all stone fragments on the right side larger than 1/3rd mm.  The access sheath was removed under continuous vision.  No mucosal abnormalities were found. We achieved the goal of right side stone free; however, he clearly will require a third stage procedure focusing on the left side.  This will be scheduled.          ______________________________ Alexis Frock, MD     TM/MEDQ  D:  06/05/2017  T:  06/06/2017  Job:  [id number]

## 2017-06-09 ENCOUNTER — Other Ambulatory Visit: Payer: Self-pay | Admitting: Urology

## 2017-06-18 NOTE — Patient Instructions (Addendum)
ARMISTEAD SULT  06/18/2017   Your procedure is scheduled on: 06-26-17   Report to [facility] Main  Entrance Follow signs to Short Stay on first floor at 530 AM    Call this number if you have problems the morning of surgery [phone]   Remember: Do not eat food or drink liquids :After Midnight.     Take these medicines the morning of surgery with A SIP OF WATER: Amlodipine (Norvasc), and Omeprazole (Prilosec)                               You may not have any metal on your body including hair pins and              piercings  Do not wear jewelry, lotions, powders or  deodorant             Men may shave face and neck.   Do not bring valuables to the hospital. [place].  Contacts, dentures or bridgework may not be worn into surgery.      Patients discharged the day of surgery will not be allowed to drive home.  Name and phone number of your driver:  Special Instructions:Terri [phone]              Please read over the following fact sheets you were given: _____________________________________________________________________             [facility] - Preparing for Surgery Before surgery, you can play an important role.  Because skin is not sterile, your skin needs to be as free of germs as possible.  You can reduce the number of germs on your skin by washing with CHG (chlorahexidine gluconate) soap before surgery.  CHG is an antiseptic cleaner which kills germs and bonds with the skin to continue killing germs even after washing. Please DO NOT use if you have an allergy to CHG or antibacterial soaps.  If your skin becomes reddened/irritated stop using the CHG and inform your nurse when you arrive at Short Stay. Do not shave (including legs and underarms) for at least 48 hours prior to the first CHG shower.  You may shave your face/neck. Please follow these instructions carefully:  1.  Shower with CHG  Soap the night before surgery and the  morning of Surgery.  2.  If you choose to wash your hair, wash your hair first as usual with your  normal  shampoo.  3.  After you shampoo, rinse your hair and body thoroughly to remove the  shampoo.                           4.  Use CHG as you would any other liquid soap.  You can apply chg directly  to the skin and wash                       Gently with a scrungie or clean washcloth.  5.  Apply the CHG Soap to your body ONLY FROM THE NECK DOWN.   Do not use on face/ open  Wound or open sores. Avoid contact with eyes, ears mouth and genitals (private parts).                       Wash face,  Genitals (private parts) with your normal soap.             6.  Wash thoroughly, paying special attention to the area where your surgery  will be performed.  7.  Thoroughly rinse your body with warm water from the neck down.  8.  DO NOT shower/wash with your normal soap after using and rinsing off  the CHG Soap.                9.  Pat yourself dry with a clean towel.            10.  Wear clean pajamas.            11.  Place clean sheets on your bed the night of your first shower and do not  sleep with pets. Day of Surgery : Do not apply any lotions/deodorants the morning of surgery.  Please wear clean clothes to the hospital/surgery center.  FAILURE TO FOLLOW THESE INSTRUCTIONS MAY RESULT IN THE CANCELLATION OF YOUR SURGERY PATIENT SIGNATURE_________________________________  NURSE SIGNATURE__________________________________  ________________________________________________________________________

## 2017-06-23 ENCOUNTER — Encounter (HOSPITAL_COMMUNITY)
Admission: RE | Admit: 2017-06-23 | Discharge: 2017-06-23 | Disposition: A | Discharge status: Home / Self Care | Payer: Medicare HMO | Source: Ambulatory Visit | Attending: Urology | Admitting: Urology

## 2017-06-23 ENCOUNTER — Other Ambulatory Visit: Payer: Self-pay

## 2017-06-23 ENCOUNTER — Encounter (HOSPITAL_COMMUNITY): Payer: Self-pay

## 2017-06-23 DIAGNOSIS — Z7982 Long term (current) use of aspirin: Secondary | ICD-10-CM | POA: Diagnosis not present

## 2017-06-23 DIAGNOSIS — Z888 Allergy status to other drugs, medicaments and biological substances status: Secondary | ICD-10-CM | POA: Diagnosis not present

## 2017-06-23 DIAGNOSIS — N132 Hydronephrosis with renal and ureteral calculous obstruction: Secondary | ICD-10-CM | POA: Diagnosis not present

## 2017-06-23 DIAGNOSIS — Z87442 Personal history of urinary calculi: Secondary | ICD-10-CM | POA: Diagnosis not present

## 2017-06-23 DIAGNOSIS — R82992 Hyperoxaluria: Secondary | ICD-10-CM | POA: Diagnosis not present

## 2017-06-23 DIAGNOSIS — Z87891 Personal history of nicotine dependence: Secondary | ICD-10-CM | POA: Diagnosis not present

## 2017-06-23 DIAGNOSIS — E785 Hyperlipidemia, unspecified: Secondary | ICD-10-CM | POA: Diagnosis not present

## 2017-06-23 DIAGNOSIS — I129 Hypertensive chronic kidney disease with stage 1 through stage 4 chronic kidney disease, or unspecified chronic kidney disease: Secondary | ICD-10-CM | POA: Diagnosis not present

## 2017-06-23 DIAGNOSIS — G4730 Sleep apnea, unspecified: Secondary | ICD-10-CM | POA: Diagnosis not present

## 2017-06-23 DIAGNOSIS — Z79899 Other long term (current) drug therapy: Secondary | ICD-10-CM | POA: Diagnosis not present

## 2017-06-23 DIAGNOSIS — Z466 Encounter for fitting and adjustment of urinary device: Secondary | ICD-10-CM | POA: Diagnosis not present

## 2017-06-23 DIAGNOSIS — M1990 Unspecified osteoarthritis, unspecified site: Secondary | ICD-10-CM | POA: Diagnosis not present

## 2017-06-23 DIAGNOSIS — Z6841 Body Mass Index (BMI) 40.0 and over, adult: Secondary | ICD-10-CM | POA: Diagnosis not present

## 2017-06-23 DIAGNOSIS — Z930 Tracheostomy status: Secondary | ICD-10-CM | POA: Diagnosis not present

## 2017-06-23 DIAGNOSIS — N183 Chronic kidney disease, stage 3 (moderate): Secondary | ICD-10-CM | POA: Diagnosis not present

## 2017-06-23 DIAGNOSIS — Z9884 Bariatric surgery status: Secondary | ICD-10-CM | POA: Diagnosis not present

## 2017-06-23 DIAGNOSIS — K219 Gastro-esophageal reflux disease without esophagitis: Secondary | ICD-10-CM | POA: Diagnosis not present

## 2017-06-23 DIAGNOSIS — E7800 Pure hypercholesterolemia, unspecified: Secondary | ICD-10-CM | POA: Diagnosis not present

## 2017-06-23 LAB — BASIC METABOLIC PANEL
ANION GAP: 11 (ref 5–15)
BUN: 19 mg/dL (ref 6–20)
CO2: 23 mmol/L (ref 22–32)
Calcium: 8.6 mg/dL — ABNORMAL LOW (ref 8.9–10.3)
Chloride: 108 mmol/L (ref 101–111)
Creatinine, Ser: 1.43 mg/dL — ABNORMAL HIGH (ref 0.61–1.24)
GFR calc Af Amer: 56 mL/min — ABNORMAL LOW (ref >60)
GFR, EST NON AFRICAN AMERICAN: 48 mL/min — AB (ref >60)
Glucose, Bld: 102 mg/dL — ABNORMAL HIGH (ref 65–99)
POTASSIUM: 4.3 mmol/L (ref 3.5–5.1)
SODIUM: 142 mmol/L (ref 135–145)

## 2017-06-23 LAB — CBC
HEMATOCRIT: 41.8 % (ref 39.0–52.0)
HEMOGLOBIN: 14.1 g/dL (ref 13.0–17.0)
MCH: 31.5 pg (ref 26.0–34.0)
MCHC: 33.7 g/dL (ref 30.0–36.0)
MCV: 93.5 fL (ref 78.0–100.0)
Platelets: 206 10*3/uL (ref 150–400)
RBC: 4.47 MIL/uL (ref 4.22–5.81)
RDW: 13.3 % (ref 11.5–15.5)
WBC: 6 10*3/uL (ref 4.0–10.5)

## 2017-06-23 NOTE — Progress Notes (Signed)
05-15-17 (Epic) EKG

## 2017-06-25 MED ORDER — GENTAMICIN SULFATE 40 MG/ML IJ SOLN
5.0000 mg/kg | INTRAVENOUS | Status: AC
  Administered 2017-06-26: 500 mg via INTRAVENOUS
  Filled 2017-06-25 (×2): qty 12.5

## 2017-06-25 NOTE — Anesthesia Preprocedure Evaluation (Addendum)
Anesthesia Evaluation    Airway Mallampati: Trach  TM Distance: >3 FB Neck ROM: Full    Dental no notable dental hx.    Pulmonary sleep apnea , former smoker,    Pulmonary exam normal breath sounds clear to auscultation       Cardiovascular hypertension, Pt. on medications Normal cardiovascular exam Rhythm:Regular Rate:Normal  ECG: SB, rate 57   Neuro/Psych negative neurological ROS  negative psych ROS   GI/Hepatic Neg liver ROS, GERD  Medicated and Controlled,  Endo/Other  Morbid obesity  Renal/GU Renal disease     Musculoskeletal   Abdominal (+) + obese,   Peds  Hematology HLD   Anesthesia Other Findings   Reproductive/Obstetrics                            Anesthesia Physical  Anesthesia Plan  ASA: III  Anesthesia Plan: General   Post-op Pain Management:    Induction: Intravenous and Inhalational  PONV Risk Score and Plan: 2 and Treatment may vary due to age or medical condition, Ondansetron and Dexamethasone  Airway Management Planned: Tracheostomy  Additional Equipment:   Intra-op Plan:   Post-operative Plan:   Informed Consent: I have reviewed the patients History and Physical, chart, labs and discussed the procedure including the risks, benefits and alternatives for the proposed anesthesia with the patient or authorized representative who has indicated his/her understanding and acceptance.     Plan Discussed with: CRNA  Anesthesia Plan Comments:        Anesthesia Quick Evaluation

## 2017-06-26 ENCOUNTER — Ambulatory Visit (HOSPITAL_COMMUNITY)
Admission: RE | Admit: 2017-06-26 | Discharge: 2017-06-26 | Disposition: A | Discharge status: Home / Self Care | Payer: Medicare HMO | Source: Ambulatory Visit | Attending: Urology | Admitting: Urology

## 2017-06-26 ENCOUNTER — Encounter (HOSPITAL_COMMUNITY): Payer: Self-pay

## 2017-06-26 ENCOUNTER — Encounter (HOSPITAL_COMMUNITY)
Admission: RE | Disposition: A | Discharge status: Home / Self Care | Payer: Self-pay | Source: Ambulatory Visit | Attending: Urology

## 2017-06-26 ENCOUNTER — Ambulatory Visit (HOSPITAL_COMMUNITY): Payer: Medicare HMO

## 2017-06-26 ENCOUNTER — Ambulatory Visit (HOSPITAL_COMMUNITY): Payer: Medicare HMO | Admitting: Anesthesiology

## 2017-06-26 DIAGNOSIS — R82992 Hyperoxaluria: Secondary | ICD-10-CM | POA: Diagnosis not present

## 2017-06-26 DIAGNOSIS — G4730 Sleep apnea, unspecified: Secondary | ICD-10-CM | POA: Insufficient documentation

## 2017-06-26 DIAGNOSIS — Z87442 Personal history of urinary calculi: Secondary | ICD-10-CM | POA: Insufficient documentation

## 2017-06-26 DIAGNOSIS — I129 Hypertensive chronic kidney disease with stage 1 through stage 4 chronic kidney disease, or unspecified chronic kidney disease: Secondary | ICD-10-CM | POA: Insufficient documentation

## 2017-06-26 DIAGNOSIS — Z79899 Other long term (current) drug therapy: Secondary | ICD-10-CM | POA: Insufficient documentation

## 2017-06-26 DIAGNOSIS — E7800 Pure hypercholesterolemia, unspecified: Secondary | ICD-10-CM | POA: Insufficient documentation

## 2017-06-26 DIAGNOSIS — Z466 Encounter for fitting and adjustment of urinary device: Secondary | ICD-10-CM | POA: Diagnosis not present

## 2017-06-26 DIAGNOSIS — M1990 Unspecified osteoarthritis, unspecified site: Secondary | ICD-10-CM | POA: Insufficient documentation

## 2017-06-26 DIAGNOSIS — Z9884 Bariatric surgery status: Secondary | ICD-10-CM | POA: Insufficient documentation

## 2017-06-26 DIAGNOSIS — Z930 Tracheostomy status: Secondary | ICD-10-CM | POA: Insufficient documentation

## 2017-06-26 DIAGNOSIS — E785 Hyperlipidemia, unspecified: Secondary | ICD-10-CM | POA: Insufficient documentation

## 2017-06-26 DIAGNOSIS — Z7982 Long term (current) use of aspirin: Secondary | ICD-10-CM | POA: Insufficient documentation

## 2017-06-26 DIAGNOSIS — N200 Calculus of kidney: Secondary | ICD-10-CM | POA: Diagnosis not present

## 2017-06-26 DIAGNOSIS — K219 Gastro-esophageal reflux disease without esophagitis: Secondary | ICD-10-CM | POA: Insufficient documentation

## 2017-06-26 DIAGNOSIS — Z888 Allergy status to other drugs, medicaments and biological substances status: Secondary | ICD-10-CM | POA: Insufficient documentation

## 2017-06-26 DIAGNOSIS — N132 Hydronephrosis with renal and ureteral calculous obstruction: Secondary | ICD-10-CM | POA: Diagnosis not present

## 2017-06-26 DIAGNOSIS — Z87891 Personal history of nicotine dependence: Secondary | ICD-10-CM | POA: Insufficient documentation

## 2017-06-26 DIAGNOSIS — Z6841 Body Mass Index (BMI) 40.0 and over, adult: Secondary | ICD-10-CM | POA: Insufficient documentation

## 2017-06-26 DIAGNOSIS — N183 Chronic kidney disease, stage 3 (moderate): Secondary | ICD-10-CM | POA: Insufficient documentation

## 2017-06-26 HISTORY — PX: CYSTOSCOPY/URETEROSCOPY/HOLMIUM LASER/STENT PLACEMENT: SHX6546

## 2017-06-26 HISTORY — PX: CYSTOSCOPY W/ URETERAL STENT REMOVAL: SHX1430

## 2017-06-26 SURGERY — CYSTOSCOPY/URETEROSCOPY/HOLMIUM LASER/STENT PLACEMENT
Anesthesia: General | Site: Ureter | Laterality: Right

## 2017-06-26 MED ORDER — FENTANYL CITRATE (PF) 100 MCG/2ML IJ SOLN: 25.0000 ug | INTRAMUSCULAR | Status: DC | PRN

## 2017-06-26 MED ORDER — ONDANSETRON HCL 4 MG/2ML IJ SOLN
INTRAMUSCULAR | Status: DC | PRN
Start: 2017-06-26 — End: 2017-06-26
  Administered 2017-06-26: 4 mg via INTRAVENOUS

## 2017-06-26 MED ORDER — PHENYLEPHRINE 40 MCG/ML (10ML) SYRINGE FOR IV PUSH (FOR BLOOD PRESSURE SUPPORT)
PREFILLED_SYRINGE | INTRAVENOUS | Status: AC
  Filled 2017-06-26: qty 10

## 2017-06-26 MED ORDER — LIDOCAINE 2% (20 MG/ML) 5 ML SYRINGE
INTRAMUSCULAR | Status: DC | PRN
  Administered 2017-06-26: 80 mg via INTRAVENOUS

## 2017-06-26 MED ORDER — ONDANSETRON HCL 4 MG/2ML IJ SOLN
INTRAMUSCULAR | Status: AC
  Filled 2017-06-26: qty 2

## 2017-06-26 MED ORDER — PROPOFOL 10 MG/ML IV BOLUS
INTRAVENOUS | Status: AC
  Filled 2017-06-26: qty 20

## 2017-06-26 MED ORDER — PROPOFOL 10 MG/ML IV BOLUS
INTRAVENOUS | Status: DC | PRN
  Administered 2017-06-26: 75 mg via INTRAVENOUS

## 2017-06-26 MED ORDER — EPHEDRINE 5 MG/ML INJ
INTRAVENOUS | Status: AC
  Filled 2017-06-26: qty 20

## 2017-06-26 MED ORDER — LACTATED RINGERS IV SOLN
INTRAVENOUS | Status: DC | PRN
Start: 2017-06-26 — End: 2017-06-26
  Administered 2017-06-26: 07:00:00 via INTRAVENOUS

## 2017-06-26 MED ORDER — IOHEXOL 300 MG/ML  SOLN
INTRAMUSCULAR | Status: DC | PRN
  Administered 2017-06-26: 18 mL via URETHRAL

## 2017-06-26 MED ORDER — PROMETHAZINE HCL 25 MG/ML IJ SOLN: 6.2500 mg | INTRAMUSCULAR | Status: DC | PRN

## 2017-06-26 MED ORDER — HYDROMORPHONE HCL 1 MG/ML IJ SOLN: 0.2500 mg | INTRAMUSCULAR | Status: DC | PRN

## 2017-06-26 MED ORDER — CEPHALEXIN 500 MG PO CAPS: 500.0000 mg | ORAL_CAPSULE | Freq: Two times a day (BID) | ORAL | 0 refills | Status: DC

## 2017-06-26 MED ORDER — HYDROCODONE-ACETAMINOPHEN 7.5-325 MG PO TABS: 1.0000 | ORAL_TABLET | Freq: Once | ORAL | Status: DC | PRN

## 2017-06-26 MED ORDER — ACETAMINOPHEN 10 MG/ML IV SOLN: 1000.0000 mg | Freq: Once | INTRAVENOUS | Status: DC | PRN

## 2017-06-26 MED ORDER — ONDANSETRON HCL 4 MG/2ML IJ SOLN: 4.0000 mg | Freq: Once | INTRAMUSCULAR | Status: DC | PRN

## 2017-06-26 MED ORDER — SODIUM CHLORIDE 0.9 % IR SOLN
Status: DC | PRN
  Administered 2017-06-26: 3000 mL via INTRAVESICAL

## 2017-06-26 MED ORDER — LIDOCAINE 2% (20 MG/ML) 5 ML SYRINGE
INTRAMUSCULAR | Status: AC
  Filled 2017-06-26: qty 5

## 2017-06-26 MED ORDER — FENTANYL CITRATE (PF) 100 MCG/2ML IJ SOLN
INTRAMUSCULAR | Status: AC
  Filled 2017-06-26: qty 2

## 2017-06-26 MED ORDER — MEPERIDINE HCL 50 MG/ML IJ SOLN: 6.2500 mg | INTRAMUSCULAR | Status: DC | PRN

## 2017-06-26 SURGICAL SUPPLY — 28 items
BAG URO CATCHER STRL LF (MISCELLANEOUS) ×4 IMPLANT
BASKET LASER NITINOL 1.9FR (BASKET) ×2 IMPLANT
BSKT STON RTRVL 120 1.9FR (BASKET) ×2
CATH INTERMIT  6FR 70CM (CATHETERS) ×4 IMPLANT
CLOTH BEACON ORANGE TIMEOUT ST (SAFETY) ×4 IMPLANT
COVER FOOTSWITCH UNIV (MISCELLANEOUS) ×3 IMPLANT
COVER SURGICAL LIGHT HANDLE (MISCELLANEOUS) ×2 IMPLANT
FIBER LASER FLEXIVA 1000 (UROLOGICAL SUPPLIES) IMPLANT
FIBER LASER FLEXIVA 365 (UROLOGICAL SUPPLIES) IMPLANT
FIBER LASER FLEXIVA 550 (UROLOGICAL SUPPLIES) IMPLANT
FIBER LASER TRAC TIP (UROLOGICAL SUPPLIES) ×2 IMPLANT
GLOVE BIOGEL M STRL SZ7.5 (GLOVE) ×4 IMPLANT
GOWN STRL REUS W/TWL LRG LVL3 (GOWN DISPOSABLE) ×6 IMPLANT
GUIDEWIRE ANG ZIPWIRE 038X150 (WIRE) ×4 IMPLANT
GUIDEWIRE STR DUAL SENSOR (WIRE) ×4 IMPLANT
IV NS 1000ML (IV SOLUTION)
IV NS 1000ML BAXH (IV SOLUTION) ×1 IMPLANT
MANIFOLD NEPTUNE II (INSTRUMENTS) ×4 IMPLANT
PACK CYSTO (CUSTOM PROCEDURE TRAY) ×4 IMPLANT
SHEATH ACCESS URETERAL 24CM (SHEATH) IMPLANT
SHEATH ACCESS URETERAL 54CM (SHEATH) IMPLANT
SHEATH URETERAL 12FRX35CM (MISCELLANEOUS) ×2 IMPLANT
STENT POLARIS 5FRX26 (STENTS) ×3 IMPLANT
SYR CONTROL 10ML LL (SYRINGE) ×2 IMPLANT
TUBE FEEDING 8FR 16IN STR KANG (MISCELLANEOUS) ×4 IMPLANT
TUBING CONNECTING 10 (TUBING) ×3 IMPLANT
TUBING CONNECTING 10' (TUBING) ×1
TUBING UROLOGY SET (TUBING) ×3 IMPLANT

## 2017-06-26 NOTE — Progress Notes (Signed)
Post note----pt able to void without difficulty; tolerated fluids well, no nausea; gait steady; voiced understanding discharge instructions; wife with pt; to car via wheelchair

## 2017-06-26 NOTE — Anesthesia Postprocedure Evaluation (Signed)
Anesthesia Post Note  Patient: THADD APUZZO  Procedure(s) Performed: CYSTOSCOPY/URETEROSCOPY third stage/HOLMIUM LASER/STENT PLACEMENT retrograde pylegram (Left Ureter) CYSTOSCOPY WITH STENT REMOVAL (Right )     Patient location during evaluation: PACU Anesthesia Type: General Level of consciousness: awake and alert Pain management: pain level controlled Vital Signs Assessment: post-procedure vital signs reviewed and stable Respiratory status: spontaneous breathing, nonlabored ventilation, respiratory function stable and patient connected to nasal cannula oxygen Cardiovascular status: blood pressure returned to baseline and stable Postop Assessment: no apparent nausea or vomiting Anesthetic complications: no    Last Vitals:  Vitals:   06/26/17 0930 06/26/17 0942  BP: (!) 149/78 (!) 163/76  Pulse: 63 60  Resp: 12 12  Temp: 36.5 C 36.6 C  SpO2: 99% 100%    Last Pain:  Vitals:   06/26/17 0942  TempSrc:   PainSc: 0-No pain                 Catrina Fellenz P Raj Landress

## 2017-06-26 NOTE — Discharge Instructions (Addendum)
1 - You may have urinary urgency (bladder spasms) and bloody urine on / off with stent in place. This is normal. ° °2 - Call MD or go to ER for fever >102, severe pain / nausea / vomiting not relieved by medications, or acute change in medical status ° °

## 2017-06-26 NOTE — H&P (Signed)
Dennis Spencer is an 72 y.o. male.    Chief Complaint: Pre-op Left 3rd stage ureteroscopic stone manipulation / Right stent pull  HPI:   1 - Recurrent Surgical Nephrolithiasis -   2011 - PCNL for large tones  2015 - Rt URS / Lt PCNL for huge volume left renal and bilateral ureteral stones, Total stone volume >6cm2    Recent Surveillance:   04/2014 - KUB, Renal US - ? tiny RLP and LUP stones by [place] alone, no hydro. KUB clear  11/2014 - KUB, Renal US - likely 45mm Rt sided stones x2, no hydro.  03/2016 - KUB, Renal US, CMP - several 3-4 mm scattered non-obstructing stones bilaterally, ?76mm left x 1. Cr <1.5 and lytes normal by PCP labs.  03/2017 -= KUB, RUS, CMP - progression of left lower pole stone, now approx 2.5cm and bilateral intra-renal stones, normal kidney function and electrolytes.   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 Currently on CaCO4 daily, though only BID and rel low-dose.   Eval 2011 - 24 hr urine - severe hyperoxaluria >170   Eval 2015 - BMP,PTH,Urate - normal, Composition - CaOx; 24 hr Urines - high oxalate (60), low citrate (160) --> CaCitrate 2080 MEQ TID meals   PMH sig for morbid obesity, trach (since 1980s). Denies ischemic heart disease. His PCP is Dennis Fraise MD with [facility].    Today Dennis Spencer is seen to proceed with 3rd stage stage bilateral ureteroscopic stone manipulation. No interval fevers. Most recent UCX negative.    Past Medical History:  Diagnosis Date  . Arthritis   . CKD (chronic kidney disease), stage III ([place]) 11/10/2013  . Gallstones   . GERD (gastroesophageal reflux disease)   . 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  . Hypercholesteremia   . Hypertension   . Kidney stones   . Osteomyelitis ([place])    LLE age 30  . Pneumonia 6-7 YRS AGO  . 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 Severe obesity (BMI >= 40) ([place]) 11/12/2013  . Sleep apnea    s/p tracheostomy, LEAVES TRACH OPEN AT NIGHT FOR SLEEP APNEA    Past Surgical History:  Procedure Laterality Date  . CHOLECYSTECTOMY N/A 06/15/2015   Procedure: LAPAROSCOPIC CHOLECYSTECTOMY;  Surgeon: Ralene Ok, MD;  Location: [facility];  Service: General;  Laterality: N/A;  . COLONOSCOPY    . CYSTOSCOPY WITH RETROGRADE PYELOGRAM, URETEROSCOPY AND STENT PLACEMENT Bilateral 06/18/2013   Procedure: CYSTOSCOPY WITH RETROGRADE PYELOGRAM, Left URETEROSCOPY with laser , AND bilateral STENT PLACEMENT;  Surgeon: Alexis Frock, MD;  Location: [facility];  Service: Urology;  Laterality: Bilateral;  . CYSTOSCOPY WITH RETROGRADE PYELOGRAM, URETEROSCOPY AND STENT PLACEMENT Right 08/08/2013   Procedure: CYSTOSCOPY WITH RIGHT RETROGRADE PYELOGRAM, URETEROSCOPY AND STENT PLACEMENT, stone extraction;  Surgeon: Alexis Frock, MD;  Location: [facility];  Service: Urology;  Laterality: Right;  . CYSTOSCOPY WITH RETROGRADE PYELOGRAM, URETEROSCOPY AND STENT PLACEMENT Right 08/10/2013   Procedure: 2ND STAGE CYSTOSCOPY WITH RETROGRADE PYELOGRAM, URETEROSCOPY AND STENT EXCHANGE;  Surgeon: Alexis Frock, MD;  Location: [facility];  Service: Urology;  Laterality: Right;  . CYSTOSCOPY WITH RETROGRADE PYELOGRAM, URETEROSCOPY AND STENT PLACEMENT Bilateral 05/22/2017   Procedure: CYSTOSCOPY WITH RETROGRADE PYELOGRAM, URETEROSCOPY AND STENT PLACEMENT;  Surgeon: Alexis Frock, MD;  Location: [facility];  Service: Urology;  Laterality: Bilateral;  . CYSTOSCOPY WITH RETROGRADE PYELOGRAM, URETEROSCOPY AND STENT PLACEMENT Bilateral 06/05/2017   Procedure: CYSTOSCOPY WITH RETROGRADE PYELOGRAM, URETEROSCOPY AND STENT PLACEMENT;  Surgeon: Alexis Frock, MD;  Location: [facility];  Service: Urology;  Laterality: Bilateral;  . GASTRIC BYPASS     2004  . HOLMIUM LASER APPLICATION Bilateral 10/14/3327    Procedure: HOLMIUM LASER APPLICATION;  Surgeon: Alexis Frock, MD;  Location: [facility];  Service: Urology;  Laterality: Bilateral;  . HOLMIUM LASER APPLICATION Left 09/10/8839   Procedure: HOLMIUM LASER APPLICATION;  Surgeon: Alexis Frock, MD;  Location: [facility];  Service: Urology;  Laterality: Left;  . HOLMIUM LASER APPLICATION Bilateral 6/60/6301   Procedure: HOLMIUM LASER APPLICATION;  Surgeon: Alexis Frock, MD;  Location: [facility];  Service: Urology;  Laterality: Bilateral;  . LITHOTRIPSY    . LITHOTRIPSY    . NEPHROLITHOTOMY Left 08/08/2013   Procedure: 1ST STAGE NEPHROLITHOTOMY PERCUTANEOUS WITH SURGEON ACCESS;  Surgeon: Alexis Frock, MD;  Location: [facility];  Service: Urology;  Laterality: Left;  . NEPHROLITHOTOMY Left 08/10/2013   Procedure: NEPHROLITHOTOMY PERCUTANEOUS SECOND LOOK/LEFT DIGITAL URETEROSCOPY/BASKETING OF STONE/EXCHANGE OF LEFT URETERAL STENT;  Surgeon: Alexis Frock, MD;  Location: [facility];  Service: Urology;  Laterality: Left;  . PERCUTANEOUS NEPHROSTOLITHOTOMY    . roux en y     [facility] (weight lost 155 lbs.) Stable -has gained 20 lbs over desired 250#  desired level  . TONSILLECTOMY     child  . TONSILLECTOMY    . TRACHEOSTOMY     '87- s/p tongue and nasal septum deviation and Sleep apnea surgery    Family History  Problem Relation Age of Onset  . Cancer Mother        Ovarian, died age 40.  . Diabetes Father   . Hypertension Father   . Heart failure Father        Died of MI age 25.  . Breast cancer Maternal Grandmother   . Colon cancer Maternal Grandfather   . Esophageal cancer Neg Hx   . Stomach cancer Neg Hx   . Rectal cancer Neg Hx    Social History:  reports that he quit smoking about 49 years ago. His smoking use included cigarettes. He has a 8.00 pack-year smoking history. he has never used smokeless tobacco. He reports that he does not drink alcohol or use drugs.  Allergies:  Allergies  Allergen Reactions  . Lisinopril Other (See  Comments)    DIZZINESS    Medications Prior to Admission  Medication Sig Dispense Refill  . amLODipine (NORVASC) 5 MG tablet TAKE 1 TABLET BY MOUTH EVERY DAY IN THE MORNING 90 tablet 1  . aspirin EC 81 MG tablet Take 81 mg by mouth every morning.     Marland Kitchen atorvastatin (LIPITOR) 80 MG tablet TAKE 1 TABLET (80 MG TOTAL) BY MOUTH DAILY AT 6 PM. 90 tablet 1  . b complex vitamins tablet Take 1 tablet by mouth daily.    . Calcium Citrate-Vitamin D (CITRACAL + D PO) Take 1 tablet by mouth 2 (two) times daily.    . Cholecalciferol (VITAMIN D3) 2000 UNITS capsule Take 2,000 Units by mouth 2 (two) times daily.     Marland Kitchen gabapentin (NEURONTIN) 600 MG tablet Take 0.5 tablets (300 mg total) by mouth daily. (Patient taking differently: Take 300 mg by mouth at bedtime. ) 45 tablet 1  . ketorolac (TORADOL) 10 MG tablet Take 1 tablet (10 mg total) by mouth every 8 (  eight) hours as needed. For mild pain / stent discomfort post-operatively. Pt has used previously. 20 tablet 0  . Multiple Vitamin (MULTIVITAMIN WITH MINERALS) TABS tablet Take 1 tablet by mouth daily.    . naproxen sodium (ALEVE) 220 MG tablet Take 220 mg by mouth 2 (two) times daily as needed (pain).    Marland Kitchen omeprazole (PRILOSEC) 20 MG capsule TAKE 1 CAPSULE (20 MG TOTAL) BY MOUTH DAILY. 90 capsule 3  . ranitidine (ZANTAC) 150 MG tablet Take 1 tablet (150 mg total) by mouth at bedtime. 90 tablet 1  . traMADol (ULTRAM) 50 MG tablet Take 1-2 tablets (50-100 mg total) by mouth every 6 (six) hours as needed for moderate pain or severe pain. Post-operatively (Patient not taking: Reported on 06/23/2017) 20 tablet 0  . vitamin C (ASCORBIC ACID) 500 MG tablet Take 500 mg by mouth 2 (two) times daily.      No results found for this or any previous visit (from the past 48 hour(s)). No results found.  Review of Systems  Constitutional: Negative.  Negative for chills and fever.  HENT: Negative.   Eyes: Negative.   Respiratory: Negative.   Cardiovascular:  Negative.   Gastrointestinal: Negative.   Genitourinary: Negative.   Musculoskeletal: Negative.   Skin: Negative.   Endo/Heme/Allergies: Negative.   Psychiatric/Behavioral: Negative.     Blood pressure (!) 166/75, pulse 80, temperature 97.7 F (36.5 C), temperature source Oral, resp. rate 16, height 5\' 10"  (1.778 m), weight (!) 142.5 kg (314 lb 2 oz), SpO2 98 %. Physical Exam  Constitutional: He appears well-developed.  HENT:  Head: Normocephalic.  Trach in place with PMV  Eyes: Pupils are equal, round, and reactive to light.  Neck: Normal range of motion.  Cardiovascular: Normal rate.  Respiratory: Effort normal.  GI:  Stable large truncal obesity  Genitourinary:  Genitourinary Comments: No CVAT  Musculoskeletal: Normal range of motion.  Neurological: He is alert.  Skin: Skin is warm.  Psychiatric: He has a normal mood and affect.     Assessment/Plan  Proceed as planned with 3rd and hopefully final staged ureteroscopy. Risks, benefits, alternatives, expected peri-op course discussed previously and reiterated today.   Alexis Frock, MD 06/26/2017, 6:51 AM

## 2017-06-26 NOTE — Transfer of Care (Signed)
Immediate Anesthesia Transfer of Care Note  Patient: NEZAR BUCKLES  Procedure(s) Performed: CYSTOSCOPY/URETEROSCOPY third stage/HOLMIUM LASER/STENT PLACEMENT retrograde pylegram (Left Ureter) CYSTOSCOPY WITH STENT REMOVAL (Right )  Patient Location: PACU  Anesthesia Type:General  Level of Consciousness: awake, alert  and oriented  Airway & Oxygen Therapy: Patient Spontanous Breathing and Patient connected to tracheostomy mask oxygen  Post-op Assessment: Report given to RN and Post -op Vital signs reviewed and stable  Post vital signs: Reviewed and stable  Last Vitals:  Vitals:   06/26/17 0534  BP: (!) 166/75  Pulse: 80  Resp: 16  Temp: 36.5 C  SpO2: 98%    Last Pain:  Vitals:   06/26/17 0534  TempSrc: Oral         Complications: No apparent anesthesia complications

## 2017-06-26 NOTE — Brief Op Note (Signed)
06/26/2017  8:31 AM  PATIENT:  Dennis Spencer  72 y.o. male  PRE-OPERATIVE DIAGNOSIS:  RESIDUAL LEFT RENAL STONE  POST-OPERATIVE DIAGNOSIS:  RESIDUAL LEFT RENAL STONE  PROCEDURE:  Procedure(s): CYSTOSCOPY/URETEROSCOPY third stage/HOLMIUM LASER/STENT PLACEMENT retrograde pylegram (Left) CYSTOSCOPY WITH STENT REMOVAL (Right)  SURGEON:  Surgeon(s) and Role:    Alexis Frock, MD - Primary  PHYSICIAN ASSISTANT:   ASSISTANTS: none   ANESTHESIA:   general  EBL:  minimal  BLOOD ADMINISTERED:none  DRAINS: none   LOCAL MEDICATIONS USED:  NONE  SPECIMEN:  Source of Specimen:  left renal / ureteral stone fragments  DISPOSITION OF SPECIMEN:  discard  COUNTS:  YES  TOURNIQUET:  * No tourniquets in log *  DICTATION: .Other Dictation: Dictation Number [id number]  PLAN OF CARE: Discharge to home after PACU  PATIENT DISPOSITION:  PACU - hemodynamically stable.   Delay start of Pharmacological VTE agent (>24hrs) due to surgical blood loss or risk of bleeding: yes

## 2017-06-27 ENCOUNTER — Encounter (HOSPITAL_COMMUNITY): Payer: Self-pay | Admitting: Urology

## 2017-06-29 NOTE — Op Note (Signed)
[name], [name] NO.:  [id number]  MEDICAL RECORD NO.:  [id number]  LOCATION:                                 FACILITY:  PHYSICIAN:  Alexis Frock, MD          DATE OF BIRTH:  DATE OF PROCEDURE:  06/26/2017                               OPERATIVE REPORT   PREOPERATIVE DIAGNOSIS:  Recurrent nephrolithiasis, left greater than right, status post two stages of left ureteroscopic stone manipulation.  POSTOPERATIVE DIAGNOSIS:  Recurrent nephrolithiasis, left greater than right, status post two stages of left ureteroscopic stone manipulation.  PROCEDURES: 1. Cystoscopy with bilateral retrograde pyelograms and interpretation. 2. Removal of right ureteral stent. 3. Exchange of left ureteral stent. 4. Left third-stage ureteroscopy with laser lithotripsy, basketing of     stone.  ESTIMATED BLOOD LOSS:  Nil.  COMPLICATIONS:  None.  SPECIMEN:  Left renal ureteral stone fragments for discard.  FINDINGS: 1. Unremarkable right retrograde pyelogram. 2. Successful removal of right ureteral stent. 3. Approximately 1 cm total volume left renal and ureteral stone     fragments. 4. Complete resolution of all stone fragments larger than 1/3rd mm     within the left kidney and ureter following third-stage     ureteroscopy and basketing. 5. Successful replacement of left ureteral stent, 5 x 26, Polaris,     proximal in the renal pelvis and distal in the urinary bladder.  No     tether.  INDICATION:  Dennis Spencer is a pleasant 72 year old gentleman with history of Roux-en-Y gastric bypass.  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  He is status post percutaneous procedures previously.  He has been on a surveillance protocol with periodic imaging and is compliant with medical therapy, which has slow to his progression some, but still is recurrent.  He has developed approximately 2.5-3 cm left renal stone burden and  we discussed options including a staged ureteroscopy with goal of preventing progression to percutaneous surgery and he wished to proceed. He has undergone two stages previously, which is left, his right side is stone free, but he did have some residual stones on the left and he presents for third-stage procedure today with goal of stone free. Informed consent was obtained and placed in the medical record.  PROCEDURE IN DETAIL:  [name], was verified. Procedure being right ureteral stent removal, left third-stage ureteroscopic stone manipulation was confirmed.  Procedure was carried out.  Time-out was performed.  Intravenous antibiotics were administered.  General anesthesia was introduced via the patient's tracheostomy trach.  The patient was placed into a low lithotomy position.  Sterile field was created by prepping and draping the patient's penis, perineum and proximal thighs using iodine. Cystourethroscopy was then performed using a 23-French rigid cystoscope with offset lens.  Inspection of the anterior-posterior urethra unremarkable.  Inspection of the urinary bladder revealed no diverticula, calcifications, or papular lesions.  Distal end of bilateral stents was seen in situ.  The distal end of the right stent was grasped, brought to the level of the urethral meatus, through which, a 0.038 Zip wire was advanced to the level of the  upper pole and right retrograde pyelogram was obtained.  Right retrograde pyelogram demonstrated a single right ureter with single-system right kidney.  No filling defects or narrowing noted.  The right stent was left out.  Similarly, the left stent was grasped, brought to the level of the urethral meatus, though which, a 0.038 Zip wire was advanced to the level of the upper pole, exchanged for an open- ended catheter and left retrograde pyelogram was obtained.  Left retrograde pyelogram demonstrated some moderate left  hydronephrosis and multifocal filling defects in the ureter consistent with likely known stone.  The Zip wire was once again advanced, set aside as a safety wire on the left side.  An 8-French feeding tube placed in the urinary bladder for pressure release.  Next, semi-rigid ureteroscopy was performed to the distal left ureter alongside a separate Sensor working wire.  There was multifocal relatively small-volume ureteral stones noted, these were all amenable to simple basketing.  They were basketed with an Escape basket and carefully navigated on their long axis to the level of the urinary bladder.  This was performed for the distal four- fifths of the left ureter.  Next, a semi-rigid scope was exchanged for a 12/14, 35-cm ureteral access sheath at the level of the proximal ureter using continuous fluoroscopic guidance.  Next, flexible digital ureteroscopy was performed to the proximal left ureter and symptomatic inspection of the left kidney.  There was multifocal stone in the left mid and lower pole, most of this was residual dusting fragments; however, there were some fragments approximately 3 that were too large for simple basketing.  Holmium laser energy applied to the stone using settings of 0.2 joules and 30 Hz, fragmenting these into smaller pieces that were then amenable to basketing.  They were removed and set aside for discard.  Additional dusting technique fragmentation was performed with residual stone using settings of 1.5 joules and 30 Hz, which resulted in complete fragmentation and dusting of all residual stone volume.  Such that, all fragments less than 1/3rd mm.  Next, the sheath was removed under continuous vision.  No mucosal abnormalities were found.  Given the dusting technique and some small residual dust fragments in the left kidney, it was felt that interval stenting would be warranted without tether.  As such, a new 5 x 26 Polaris-type stent was placed over the  remaining safety wire using cystoscopic and fluoroscopic guidance.  Good proximal and distal deployment were noted. Bladder was emptied per cystoscope, procedure was then terminated.  The patient tolerated the procedure well.  There were no immediate periprocedural complications.  The patient was taken to the postanesthesia care unit is stable condition.          ______________________________ Alexis Frock, MD     TM/MEDQ  D:  06/26/2017  T:  06/27/2017  Job:  [id number]

## 2017-07-13 DIAGNOSIS — N200 Calculus of kidney: Secondary | ICD-10-CM | POA: Diagnosis not present

## 2017-07-13 DIAGNOSIS — N5201 Erectile dysfunction due to arterial insufficiency: Secondary | ICD-10-CM | POA: Diagnosis not present

## 2017-08-01 ENCOUNTER — Other Ambulatory Visit: Payer: Self-pay | Admitting: Family Medicine

## 2017-08-01 DIAGNOSIS — I10 Essential (primary) hypertension: Secondary | ICD-10-CM

## 2017-08-01 DIAGNOSIS — K219 Gastro-esophageal reflux disease without esophagitis: Secondary | ICD-10-CM

## 2017-08-12 ENCOUNTER — Ambulatory Visit (INDEPENDENT_AMBULATORY_CARE_PROVIDER_SITE_OTHER): Payer: Medicare HMO | Admitting: Family Medicine

## 2017-08-12 ENCOUNTER — Encounter: Payer: Self-pay | Admitting: Family Medicine

## 2017-08-12 VITALS — BP 140/70 | HR 79 | Temp 96.9°F | Ht 70.0 in | Wt 316.5 lb

## 2017-08-12 DIAGNOSIS — N183 Chronic kidney disease, stage 3 unspecified: Secondary | ICD-10-CM

## 2017-08-12 DIAGNOSIS — E559 Vitamin D deficiency, unspecified: Secondary | ICD-10-CM | POA: Diagnosis not present

## 2017-08-12 DIAGNOSIS — E7800 Pure hypercholesterolemia, unspecified: Secondary | ICD-10-CM | POA: Diagnosis not present

## 2017-08-12 DIAGNOSIS — I10 Essential (primary) hypertension: Secondary | ICD-10-CM

## 2017-08-12 DIAGNOSIS — G4733 Obstructive sleep apnea (adult) (pediatric): Secondary | ICD-10-CM | POA: Diagnosis not present

## 2017-08-12 DIAGNOSIS — K219 Gastro-esophageal reflux disease without esophagitis: Secondary | ICD-10-CM | POA: Diagnosis not present

## 2017-08-12 DIAGNOSIS — Z23 Encounter for immunization: Secondary | ICD-10-CM | POA: Diagnosis not present

## 2017-08-12 MED ORDER — AMLODIPINE BESYLATE 5 MG PO TABS: 5.0000 mg | ORAL_TABLET | ORAL | 1 refills | Status: DC

## 2017-08-12 MED ORDER — RANITIDINE HCL 150 MG PO TABS: 150.0000 mg | ORAL_TABLET | Freq: Every day | ORAL | 1 refills | Status: DC

## 2017-08-12 MED ORDER — ATORVASTATIN CALCIUM 80 MG PO TABS: ORAL_TABLET | ORAL | 1 refills | Status: DC

## 2017-08-12 MED ORDER — GABAPENTIN 600 MG PO TABS: 300.0000 mg | ORAL_TABLET | Freq: Every day | ORAL | 1 refills | Status: DC

## 2017-08-12 MED ORDER — OMEPRAZOLE 20 MG PO CPDR: 20.0000 mg | DELAYED_RELEASE_CAPSULE | Freq: Every day | ORAL | 3 refills | Status: DC

## 2017-08-12 MED ORDER — DULOXETINE HCL 30 MG PO CPEP: ORAL_CAPSULE | ORAL | 2 refills | Status: DC

## 2017-08-12 NOTE — Patient Instructions (Signed)
Fat and Cholesterol Restricted Diet Getting too much fat and cholesterol in your diet may cause health problems. Following this diet helps keep your fat and cholesterol at normal levels. This can keep you from getting sick. What types of fat should I choose?  Choose monosaturated and polyunsaturated fats. These are found in foods such as olive oil, canola oil, flaxseeds, walnuts, almonds, and seeds.  Eat more omega-3 fats. Good choices include salmon, mackerel, sardines, tuna, flaxseed oil, and ground flaxseeds.  Limit saturated fats. These are in animal products such as meats, butter, and cream. They can also be in plant products such as palm oil, palm kernel oil, and coconut oil.  Avoid foods with partially hydrogenated oils in them. These contain trans fats. Examples of foods that have trans fats are stick margarine, some tub margarines, cookies, crackers, and other baked goods. What general guidelines do I need to follow?  Check food labels. Look for the words "trans fat" and "saturated fat."  When preparing a meal: ? Fill half of your plate with vegetables and green salads. ? Fill one fourth of your plate with whole grains. Look for the word "whole" as the first word in the ingredient list. ? Fill one fourth of your plate with lean protein foods.  Eat more foods that have fiber, like apples, carrots, beans, peas, and barley.  Eat more home-cooked foods. Eat less at restaurants and buffets.  Limit or avoid alcohol.  Limit foods high in starch and sugar.  Limit fried foods.  Cook foods without frying them. Baking, boiling, grilling, and broiling are all great options.  Lose weight if you are overweight. Losing even a small amount of weight can help your overall health. It can also help prevent diseases such as diabetes and heart disease. What foods can I eat? Grains Whole grains, such as whole wheat or whole grain breads, crackers, cereals, and pasta. Unsweetened oatmeal,  bulgur, barley, quinoa, or brown rice. Corn or whole wheat flour tortillas. Vegetables Fresh or frozen vegetables (raw, steamed, roasted, or grilled). Green salads. Fruits All fresh, canned (in natural juice), or frozen fruits. Meat and Other Protein Products Ground beef (85% or leaner), grass-fed beef, or beef trimmed of fat. Skinless chicken or turkey. Ground chicken or turkey. Pork trimmed of fat. All fish and seafood. Eggs. Dried beans, peas, or lentils. Unsalted nuts or seeds. Unsalted canned or dry beans. Dairy Low-fat dairy products, such as skim or 1% milk, 2% or reduced-fat cheeses, low-fat ricotta or cottage cheese, or plain low-fat yogurt. Fats and Oils Tub margarines without trans fats. Light or reduced-fat mayonnaise and salad dressings. Avocado. Olive, canola, sesame, or safflower oils. Natural peanut or almond butter (choose ones without added sugar and oil). The items listed above may not be a complete list of recommended foods or beverages. Contact your dietitian for more options. What foods are not recommended? Grains White bread. White pasta. White rice. Cornbread. Bagels, pastries, and croissants. Crackers that contain trans fat. Vegetables White potatoes. Corn. Creamed or fried vegetables. Vegetables in a cheese sauce. Fruits Dried fruits. Canned fruit in light or heavy syrup. Fruit juice. Meat and Other Protein Products Fatty cuts of meat. Ribs, chicken wings, bacon, sausage, bologna, salami, chitterlings, fatback, hot dogs, bratwurst, and packaged luncheon meats. Liver and organ meats. Dairy Whole or 2% milk, cream, half-and-half, and cream cheese. Whole milk cheeses. Whole-fat or sweetened yogurt. Full-fat cheeses. Nondairy creamers and whipped toppings. Processed cheese, cheese spreads, or cheese curds. Sweets and Desserts Corn   syrup, sugars, honey, and molasses. Candy. Jam and jelly. Syrup. Sweetened cereals. Cookies, pies, cakes, donuts, muffins, and ice  cream. Fats and Oils Butter, stick margarine, lard, shortening, ghee, or bacon fat. Coconut, palm kernel, or palm oils. Beverages Alcohol. Sweetened drinks (such as sodas, lemonade, and fruit drinks or punches). The items listed above may not be a complete list of foods and beverages to avoid. Contact your dietitian for more information. This information is not intended to replace advice given to you by your health care provider. Make sure you discuss any questions you have with your health care provider. Document Released: 10/28/2011 Document Revised: 01/03/2016 Document Reviewed: 07/28/2013 Elsevier Interactive Patient Education  2018 [employer]  

## 2017-08-12 NOTE — Progress Notes (Signed)
Subjective:  Patient ID: Dennis Spencer, male    DOB: 03/26/1946  Age: 72 y.o. MRN: [id number]  CC: Follow-up (pt here today for routine follow up of his chronic medical conditions)   HPI Dennis Spencer presents for  follow-up of hypertension. Patient has no history of headache chest pain or shortness of breath or recent cough. Patient also denies symptoms of TIA such as focal numbness or weakness. Patient checks  blood pressure at home. Readings recently good except that they are impacted by his daughter's loss of her job, creating increased stress at home.  Patient denies side effects from medication. States taking it regularly.   Patient in for follow-up of elevated cholesterol. Doing well without complaints on current medication. Denies side effects of statin including myalgia and arthralgia and nausea. Also in today for liver function testing. Currently no chest pain, shortness of breath or other cardiovascular related symptoms noted.  Some occasional mild edema around the ankles which could of course be related to his medication and/or chronic renal insufficiency.  Blood work to be drawn today.  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   He has a past surgical history that includes Lithotripsy; Lithotripsy; Percutaneous nephrostolithotomy; Cystoscopy with retrograde pyelogram, ureteroscopy and stent placement (Bilateral, 06/18/2013); Holmium laser application (Bilateral, 06/18/2013); roux en y; Tracheostomy; Tonsillectomy; Nephrolithotomy (Left, 08/08/2013); Cystoscopy with retrograde pyelogram, ureteroscopy and stent placement (Right,  08/08/2013); Nephrolithotomy (Left, 08/10/2013); Cystoscopy with retrograde pyelogram, ureteroscopy and stent placement (Right, 08/10/2013); Holmium laser application (Left, 11/14/6431); Cholecystectomy (N/A, 06/15/2015); Colonoscopy; Tonsillectomy; Gastric bypass; Cystoscopy with retrograde pyelogram, ureteroscopy and stent placement (Bilateral, 05/22/2017); Holmium laser application (Bilateral, 05/22/2017); Cystoscopy with retrograde pyelogram, ureteroscopy and stent placement (Bilateral, 06/05/2017); Cystoscopy/ureteroscopy/holmium laser/stent placement (Left, 06/26/2017); and Cystoscopy w/ ureteral stent removal (Right, 06/26/2017).   His family history includes Breast cancer in his maternal grandmother; Cancer in his mother; Colon cancer in his maternal grandfather; Diabetes in his father; Heart failure in his father; Hypertension in his father.He reports that he quit smoking about 49 years ago. His smoking use included cigarettes. He has a 8.00 pack-year smoking history. He has never used smokeless tobacco. He reports that he does not drink alcohol or use drugs.  Current Outpatient Medications on File Prior to Visit  Medication Sig Dispense Refill  . aspirin EC 81 MG tablet Take 81 mg by mouth every morning.     Marland Kitchen b complex vitamins tablet Take 1 tablet by mouth daily.    . Calcium Citrate-Vitamin D (CITRACAL + D PO) Take 1 tablet by mouth 2 (two) times daily.    . Cholecalciferol (VITAMIN D3) 2000 UNITS capsule Take 2,000 Units by mouth 2 (two) times daily.     . Multiple Vitamin (MULTIVITAMIN WITH MINERALS) TABS tablet Take 1 tablet by mouth daily.    . vitamin C (ASCORBIC ACID) 500 MG tablet Take 500 mg by mouth 2 (two) times daily.     No current facility-administered medications on file prior to visit.     ROS Review of Systems  Constitutional: Negative.   HENT: Negative.   Eyes: Negative for visual disturbance.  Respiratory: Negative for cough and shortness of breath.   Cardiovascular: Negative  for chest pain and leg swelling.  Gastrointestinal: Positive for abdominal pain (heartburn from naproxen - went aaway when med was DCed.). Negative for diarrhea, nausea and vomiting.  Genitourinary: Negative for difficulty urinating.  Musculoskeletal: Positive for arthralgias (knees, right shoulder). Negative for myalgias.  Skin: Negative for rash.  Neurological: Negative for headaches.  Psychiatric/Behavioral: Negative for sleep disturbance.    Objective:  BP 140/70   Pulse 79   Temp (!) 96.9 F (36.1 C) (Oral)   Ht 5' 10"  (1.778 m)   Wt (!) 316 lb 8 oz (143.6 kg)   BMI 45.41 kg/m   BP Readings from Last 3 Encounters:  08/12/17 140/70  06/26/17 (!) 163/76  06/23/17 (!) 156/66    Wt Readings from Last 3 Encounters:  08/12/17 (!) 316 lb 8 oz (143.6 kg)  06/26/17 (!) 314 lb 2 oz (142.5 kg)  06/23/17 (!) 314 lb 2 oz (142.5 kg)     Physical Exam  Constitutional: He is oriented to person, place, and time. He appears well-developed and well-nourished. No distress.  HENT:  Head: Normocephalic and atraumatic.  Right Ear: External ear normal.  Left Ear: External ear normal.  Nose: Nose normal.  Mouth/Throat: Oropharynx is clear and moist.  Eyes: Pupils are equal, round, and reactive to light. Conjunctivae and EOM are normal.  Neck: Normal range of motion. Neck supple. No thyromegaly present.  Trach tube in place.  Cardiovascular: Normal rate, regular rhythm and normal heart sounds.  No murmur heard. Pulmonary/Chest: Effort normal and breath sounds normal. No respiratory distress. He has no wheezes. He has no rales.  Abdominal: Soft. Bowel sounds are normal. He exhibits no distension. There is no tenderness.  Lymphadenopathy:    He has no cervical adenopathy.  Neurological: He is alert and oriented to person, place, and time. He has normal reflexes.  Skin: Skin is warm and dry.  Psychiatric: He has a normal mood and affect. His behavior is normal. Judgment and thought content  normal.  Vitals reviewed.     Assessment & Plan:   [name] was seen today for follow-up.  Diagnoses and all orders for this visit:  Essential hypertension -     CBC with Differential/Platelet -     CMP14+EGFR -     amLODipine (NORVASC) 5 MG tablet; Take 1 tablet (5 mg total) by mouth every morning.  Hypercholesteremia -     Lipid panel -     atorvastatin (LIPITOR) 80 MG tablet; TAKE 1 TABLET (80 MG TOTAL) BY MOUTH DAILY AT 6 PM.  CKD (chronic kidney disease), stage III (HCC)  OSA (obstructive sleep apnea)  Gastroesophageal reflux disease without esophagitis -     ranitidine (ZANTAC) 150 MG tablet; Take 1 tablet (150 mg total) by mouth at bedtime.  Vitamin D deficiency -     VITAMIN D 25 Hydroxy (Vit-D Deficiency, Fractures)  Other orders -     DULoxetine (CYMBALTA) 30 MG capsule; Take 1 by mouth with supper for 1 week then 2 by mouth with supper for arthritis pain. -     omeprazole (PRILOSEC) 20 MG capsule; Take 1 capsule (20 mg total) by mouth daily. -     gabapentin (NEURONTIN) 600 MG tablet; Take 0.5 tablets (300 mg total) by mouth daily. -     Pneumococcal polysaccharide vaccine 23-valent greater than or equal to 2yo subcutaneous/IM   Allergies as of 08/12/2017      Reactions   Lisinopril Other (See Comments)   DIZZINESS      Medication List  Accurate as of 08/12/17 11:17 AM. Always use your most recent med list.          amLODipine 5 MG tablet Commonly known as:  NORVASC Take 1 tablet (5 mg total) by mouth every morning.   aspirin EC 81 MG tablet Take 81 mg by mouth every morning.   atorvastatin 80 MG tablet Commonly known as:  LIPITOR TAKE 1 TABLET (80 MG TOTAL) BY MOUTH DAILY AT 6 PM.   b complex vitamins tablet Take 1 tablet by mouth daily.   CITRACAL + D PO Take 1 tablet by mouth 2 (two) times daily.   DULoxetine 30 MG capsule Commonly known as:  CYMBALTA Take 1 by mouth with supper for 1 week then 2 by mouth with supper for arthritis  pain.   gabapentin 600 MG tablet Commonly known as:  NEURONTIN Take 0.5 tablets (300 mg total) by mouth daily.   multivitamin with minerals Tabs tablet Take 1 tablet by mouth daily.   omeprazole 20 MG capsule Commonly known as:  PRILOSEC Take 1 capsule (20 mg total) by mouth daily.   ranitidine 150 MG tablet Commonly known as:  ZANTAC Take 1 tablet (150 mg total) by mouth at bedtime.   vitamin C 500 MG tablet Commonly known as:  ASCORBIC ACID Take 500 mg by mouth 2 (two) times daily.   Vitamin D3 2000 units capsule Take 2,000 Units by mouth 2 (two) times daily.       Meds ordered this encounter  Medications  . DULoxetine (CYMBALTA) 30 MG capsule    Sig: Take 1 by mouth with supper for 1 week then 2 by mouth with supper for arthritis pain.    Dispense:  60 capsule    Refill:  2  . omeprazole (PRILOSEC) 20 MG capsule    Sig: Take 1 capsule (20 mg total) by mouth daily.    Dispense:  90 capsule    Refill:  3  . gabapentin (NEURONTIN) 600 MG tablet    Sig: Take 0.5 tablets (300 mg total) by mouth daily.    Dispense:  45 tablet    Refill:  1  . atorvastatin (LIPITOR) 80 MG tablet    Sig: TAKE 1 TABLET (80 MG TOTAL) BY MOUTH DAILY AT 6 PM.    Dispense:  90 tablet    Refill:  1  . ranitidine (ZANTAC) 150 MG tablet    Sig: Take 1 tablet (150 mg total) by mouth at bedtime.    Dispense:  90 tablet    Refill:  1  . amLODipine (NORVASC) 5 MG tablet    Sig: Take 1 tablet (5 mg total) by mouth every morning.    Dispense:  90 tablet    Refill:  1      Follow-up: Return in about 6 months (around 02/11/2018).  Claretta Fraise, M.D.

## 2017-08-13 LAB — CMP14+EGFR
ALT: 17 IU/L (ref 0–44)
AST: 26 IU/L (ref 0–40)
Albumin/Globulin Ratio: 1.5 (ref 1.2–2.2)
Albumin: 3.8 g/dL (ref 3.5–4.8)
Alkaline Phosphatase: 83 IU/L (ref 39–117)
BUN/Creatinine Ratio: 10 (ref 10–24)
BUN: 14 mg/dL (ref 8–27)
Bilirubin Total: 0.7 mg/dL (ref 0.0–1.2)
CO2: 22 mmol/L (ref 20–29)
Calcium: 9.4 mg/dL (ref 8.6–10.2)
Chloride: 103 mmol/L (ref 96–106)
Creatinine, Ser: 1.41 mg/dL — ABNORMAL HIGH (ref 0.76–1.27)
GFR calc Af Amer: 58 mL/min/1.73 — ABNORMAL LOW
GFR calc non Af Amer: 50 mL/min/1.73 — ABNORMAL LOW
Globulin, Total: 2.6 g/dL (ref 1.5–4.5)
Glucose: 110 mg/dL — ABNORMAL HIGH (ref 65–99)
Potassium: 4.4 mmol/L (ref 3.5–5.2)
Sodium: 145 mmol/L — ABNORMAL HIGH (ref 134–144)
Total Protein: 6.4 g/dL (ref 6.0–8.5)

## 2017-08-13 LAB — LIPID PANEL
CHOLESTEROL TOTAL: 143 mg/dL (ref 100–199)
Chol/HDL Ratio: 3.1 ratio (ref 0.0–5.0)
HDL: 46 mg/dL (ref >39)
LDL Calculated: 75 mg/dL (ref 0–99)
TRIGLYCERIDES: 111 mg/dL (ref 0–149)
VLDL CHOLESTEROL CAL: 22 mg/dL (ref 5–40)

## 2017-08-13 LAB — VITAMIN D 25 HYDROXY (VIT D DEFICIENCY, FRACTURES): Vit D, 25-Hydroxy: 38.9 ng/mL (ref 30.0–100.0)

## 2017-08-13 LAB — CBC WITH DIFFERENTIAL/PLATELET
BASOS ABS: 0 10*3/uL (ref 0.0–0.2)
Basos: 1 %
EOS (ABSOLUTE): 0.2 10*3/uL (ref 0.0–0.4)
Eos: 5 %
HEMOGLOBIN: 14.7 g/dL (ref 13.0–17.7)
Hematocrit: 42.9 % (ref 37.5–51.0)
Immature Grans (Abs): 0 10*3/uL (ref 0.0–0.1)
Immature Granulocytes: 0 %
LYMPHS ABS: 1.5 10*3/uL (ref 0.7–3.1)
Lymphs: 30 %
MCH: 30.8 pg (ref 26.6–33.0)
MCHC: 34.3 g/dL (ref 31.5–35.7)
MCV: 90 fL (ref 79–97)
Monocytes Absolute: 0.5 10*3/uL (ref 0.1–0.9)
Monocytes: 11 %
Neutrophils Absolute: 2.8 10*3/uL (ref 1.4–7.0)
Neutrophils: 53 %
Platelets: 233 10*3/uL (ref 150–379)
RBC: 4.78 x10E6/uL (ref 4.14–5.80)
RDW: 13.4 % (ref 12.3–15.4)
WBC: 5.1 10*3/uL (ref 3.4–10.8)

## 2017-09-03 ENCOUNTER — Other Ambulatory Visit: Payer: Self-pay | Admitting: *Deleted

## 2017-09-03 MED ORDER — DULOXETINE HCL 30 MG PO CPEP: ORAL_CAPSULE | ORAL | 0 refills | Status: DC

## 2017-10-15 ENCOUNTER — Encounter: Payer: Self-pay | Admitting: *Deleted

## 2017-12-03 ENCOUNTER — Other Ambulatory Visit: Payer: Self-pay | Admitting: Family Medicine

## 2017-12-03 NOTE — Telephone Encounter (Signed)
Last seen 08/12/17

## 2018-02-09 ENCOUNTER — Ambulatory Visit (INDEPENDENT_AMBULATORY_CARE_PROVIDER_SITE_OTHER): Payer: Medicare HMO | Admitting: Family Medicine

## 2018-02-09 ENCOUNTER — Encounter: Payer: Self-pay | Admitting: Family Medicine

## 2018-02-09 VITALS — BP 134/82 | HR 84 | Temp 97.4°F | Ht 70.0 in | Wt 320.0 lb

## 2018-02-09 DIAGNOSIS — Z23 Encounter for immunization: Secondary | ICD-10-CM

## 2018-02-09 DIAGNOSIS — G8929 Other chronic pain: Secondary | ICD-10-CM

## 2018-02-09 DIAGNOSIS — M25562 Pain in left knee: Secondary | ICD-10-CM | POA: Diagnosis not present

## 2018-02-09 DIAGNOSIS — G4733 Obstructive sleep apnea (adult) (pediatric): Secondary | ICD-10-CM

## 2018-02-09 DIAGNOSIS — Z0000 Encounter for general adult medical examination without abnormal findings: Secondary | ICD-10-CM

## 2018-02-09 DIAGNOSIS — M25561 Pain in right knee: Secondary | ICD-10-CM

## 2018-02-09 DIAGNOSIS — N183 Chronic kidney disease, stage 3 unspecified: Secondary | ICD-10-CM

## 2018-02-09 DIAGNOSIS — E7800 Pure hypercholesterolemia, unspecified: Secondary | ICD-10-CM

## 2018-02-09 DIAGNOSIS — K219 Gastro-esophageal reflux disease without esophagitis: Secondary | ICD-10-CM

## 2018-02-09 DIAGNOSIS — I10 Essential (primary) hypertension: Secondary | ICD-10-CM | POA: Diagnosis not present

## 2018-02-09 MED ORDER — AMLODIPINE BESYLATE 5 MG PO TABS: 5.0000 mg | ORAL_TABLET | ORAL | 1 refills | Status: DC

## 2018-02-09 MED ORDER — GABAPENTIN 600 MG PO TABS: 300.0000 mg | ORAL_TABLET | Freq: Every day | ORAL | 1 refills | Status: DC

## 2018-02-09 MED ORDER — OMEPRAZOLE 40 MG PO CPDR: 40.0000 mg | DELAYED_RELEASE_CAPSULE | Freq: Every day | ORAL | 3 refills | Status: DC

## 2018-02-09 MED ORDER — RANITIDINE HCL 150 MG PO TABS: 150.0000 mg | ORAL_TABLET | Freq: Every day | ORAL | 1 refills | Status: DC

## 2018-02-09 MED ORDER — ATORVASTATIN CALCIUM 80 MG PO TABS: ORAL_TABLET | ORAL | 1 refills | Status: DC

## 2018-02-09 NOTE — Progress Notes (Signed)
Subjective:  Patient ID: Dennis Spencer, male    DOB: 09/02/1945  Age: 72 y.o. MRN: [id number]  CC: Annual Exam   HPI Dennis Spencer presents for annual physical examination.  His main concern today is that his knee pain is getting worse.  He is considering knee replacement.  He has had injections in the knees in the past and that helped.  He is interested in having cortisone injections bilaterally.   Follow-up of hypertension. Patient has no history of headache chest pain or shortness of breath or recent cough. Patient also denies symptoms of TIA such as numbness weakness lateralizing. Patient checks  blood pressure at home and has not had any elevated readings recently. Patient denies side effects from his medication. States taking it regularly.   Patient in for follow-up of elevated cholesterol. Doing well without complaints on current medication. Denies side effects of statin including myalgia and arthralgia and nausea. Also in today for liver function testing. Currently no chest pain, shortness of breath or other cardiovascular related symptoms noted.  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   He has a past surgical history that includes Lithotripsy; Lithotripsy; Percutaneous nephrostolithotomy; Cystoscopy with retrograde pyelogram, ureteroscopy and stent placement (Bilateral, 06/18/2013); Holmium laser application (Bilateral, 06/18/2013); roux en y; Tracheostomy; Tonsillectomy;  Nephrolithotomy (Left, 08/08/2013); Cystoscopy with retrograde pyelogram, ureteroscopy and stent placement (Right, 08/08/2013); Nephrolithotomy (Left, 08/10/2013); Cystoscopy with retrograde pyelogram, ureteroscopy and stent placement (Right, 08/10/2013); Holmium laser application (Left, 09/12/6501); Cholecystectomy (N/A, 06/15/2015); Colonoscopy; Tonsillectomy; Gastric bypass; Cystoscopy with retrograde pyelogram, ureteroscopy and stent placement (Bilateral, 05/22/2017); Holmium laser application (Bilateral, 05/22/2017); Cystoscopy with retrograde pyelogram, ureteroscopy and stent placement (Bilateral, 06/05/2017); Cystoscopy/ureteroscopy/holmium laser/stent placement (Left, 06/26/2017); and Cystoscopy w/ ureteral stent removal (Right, 06/26/2017).   His family history includes Breast cancer in his maternal grandmother; Cancer in his mother; Colon cancer in his maternal grandfather; Diabetes in his father; Heart failure in his father; Hypertension in his father.He reports that he quit smoking about 49 years ago. His smoking use included cigarettes. He has a 8.00 pack-year smoking history. He has never used smokeless tobacco. He reports that he does not drink alcohol or use drugs.    ROS Review of Systems  Constitutional: Negative for activity change, fatigue and unexpected weight change.  HENT: Negative for congestion, ear pain, hearing loss, postnasal drip and trouble swallowing.   Eyes: Negative for pain and visual disturbance.  Respiratory: Negative for cough, chest tightness and shortness of breath.   Cardiovascular: Negative for chest pain, palpitations and leg swelling.  Gastrointestinal: Negative for abdominal distention, abdominal pain, blood in stool, constipation, diarrhea, nausea and vomiting.  Endocrine: Negative for cold intolerance, heat intolerance and polydipsia.  Genitourinary: Negative for difficulty urinating, dysuria, flank pain, frequency and urgency.  Musculoskeletal: Positive for  arthralgias. Negative for joint swelling.  Skin: Negative for color change, rash and wound.  Neurological: Negative for dizziness, syncope, speech difficulty, weakness, light-headedness, numbness and headaches.  Hematological: Does not bruise/bleed easily.  Psychiatric/Behavioral: Negative for confusion, decreased concentration, dysphoric mood and sleep  disturbance. The patient is not nervous/anxious.     Objective:  BP 134/82   Pulse 84   Temp (!) 97.4 F (36.3 C) (Oral)   Ht 5' 10"  (1.778 m)   Wt (!) 320 lb (145.2 kg)   BMI 45.92 kg/m   BP Readings from Last 3 Encounters:  02/09/18 134/82  08/12/17 140/70  06/26/17 (!) 163/76    Wt Readings from Last 3 Encounters:  02/09/18 (!) 320 lb (145.2 kg)  08/12/17 (!) 316 lb 8 oz (143.6 kg)  06/26/17 (!) 314 lb 2 oz (142.5 kg)     Physical Exam  Constitutional: He is oriented to person, place, and time. He appears well-developed and well-nourished.  Morbidly obese  HENT:  Head: Normocephalic and atraumatic.  Mouth/Throat: Oropharynx is clear and moist.  Eyes: Pupils are equal, round, and reactive to light. EOM are normal.  Neck: Normal range of motion. No tracheal deviation present. No thyromegaly present.  Cardiovascular: Normal rate, regular rhythm and normal heart sounds. Exam reveals no gallop and no friction rub.  No murmur heard. Pulmonary/Chest: Breath sounds normal. He has no wheezes. He has no rales.  Abdominal: Soft. Bowel sounds are normal. He exhibits no distension and no mass. There is no tenderness. Hernia confirmed negative in the right inguinal area and confirmed negative in the left inguinal area.  Genitourinary: Testes normal and penis normal.  Musculoskeletal: Normal range of motion. He exhibits no edema.  Lymphadenopathy:    He has no cervical adenopathy.  Neurological: He is alert and oriented to person, place, and time.  Skin: Skin is warm and dry.  Psychiatric: He has a normal mood and affect.  Vitals  reviewed.     Assessment & Plan:   Dennis Spencer was seen today for annual exam.  Diagnoses and all orders for this visit:  Well adult exam  Gastroesophageal reflux disease without esophagitis -     ranitidine (ZANTAC) 150 MG tablet; Take 1 tablet (150 mg total) by mouth at bedtime. -     CBC with Differential/Platelet -     CMP14+EGFR -     Lipid panel -     VITAMIN D 25 Hydroxy (Vit-D Deficiency, Fractures) -     PSA, total and free -     Cancel: VITAMIN D 25 Hydroxy (Vit-D Deficiency, Fractures)  Hypercholesteremia -     atorvastatin (LIPITOR) 80 MG tablet; TAKE 1 TABLET (80 MG TOTAL) BY MOUTH DAILY AT 6 PM. -     CBC with Differential/Platelet -     CMP14+EGFR -     Lipid panel -     VITAMIN D 25 Hydroxy (Vit-D Deficiency, Fractures) -     PSA, total and free -     Cancel: VITAMIN D 25 Hydroxy (Vit-D Deficiency, Fractures)  Essential hypertension -     amLODipine (NORVASC) 5 MG tablet; Take 1 tablet (5 mg total) by mouth every morning. -     CBC with Differential/Platelet -     CMP14+EGFR -     Lipid panel -     VITAMIN D 25 Hydroxy (Vit-D Deficiency, Fractures) -     PSA, total and free -     Cancel: VITAMIN D 25 Hydroxy (Vit-D Deficiency, Fractures)  Chronic pain of both knees -     Ambulatory referral to Orthopedic Surgery -     PSA, total and free -     Cancel: VITAMIN D 25 Hydroxy (Vit-D Deficiency, Fractures)  Need for immunization against influenza -  Flu Vaccine QUAD 36+ mos IM  OSA (obstructive sleep apnea)  CKD (chronic kidney disease), stage III (HCC)  Severe obesity (BMI >= 40) (HCC)  Other orders -     gabapentin (NEURONTIN) 600 MG tablet; Take 0.5 tablets (300 mg total) by mouth daily. -     omeprazole (PRILOSEC) 40 MG capsule; Take 1 capsule (40 mg total) by mouth daily.       I have discontinued Shirley Bolle. Boniface's omeprazole and DULoxetine. I am also having him start on omeprazole. Additionally, I am having him maintain his vitamin C,  aspirin EC, Vitamin D3, multivitamin with minerals, b complex vitamins, Calcium Citrate-Vitamin D (CITRACAL + D PO), ranitidine, gabapentin, atorvastatin, and amLODipine.  Allergies as of 02/09/2018      Reactions   Lisinopril Other (See Comments)   DIZZINESS      Medication List        Accurate as of 02/09/18  6:13 PM. Always use your most recent med list.          amLODipine 5 MG tablet Commonly known as:  NORVASC Take 1 tablet (5 mg total) by mouth every morning.   aspirin EC 81 MG tablet Take 81 mg by mouth every morning.   atorvastatin 80 MG tablet Commonly known as:  LIPITOR TAKE 1 TABLET (80 MG TOTAL) BY MOUTH DAILY AT 6 PM.   b complex vitamins tablet Take 1 tablet by mouth daily.   CITRACAL + D PO Take 1 tablet by mouth 2 (two) times daily.   gabapentin 600 MG tablet Commonly known as:  NEURONTIN Take 0.5 tablets (300 mg total) by mouth daily.   multivitamin with minerals Tabs tablet Take 1 tablet by mouth daily.   omeprazole 40 MG capsule Commonly known as:  PRILOSEC Take 1 capsule (40 mg total) by mouth daily.   ranitidine 150 MG tablet Commonly known as:  ZANTAC Take 1 tablet (150 mg total) by mouth at bedtime.   vitamin C 500 MG tablet Commonly known as:  ASCORBIC ACID Take 500 mg by mouth 2 (two) times daily.   Vitamin D3 2000 units capsule Take 2,000 Units by mouth 2 (two) times daily.        Follow-up: Return in about 1 day (around 02/10/2018) for Joint injection both knees.  Claretta Fraise, M.D.

## 2018-02-10 ENCOUNTER — Encounter: Payer: Self-pay | Admitting: Family Medicine

## 2018-02-10 ENCOUNTER — Ambulatory Visit (INDEPENDENT_AMBULATORY_CARE_PROVIDER_SITE_OTHER): Payer: Medicare HMO | Admitting: Family Medicine

## 2018-02-10 ENCOUNTER — Encounter: Payer: Medicare HMO | Admitting: Family Medicine

## 2018-02-10 VITALS — BP 123/66 | HR 69 | Temp 97.8°F | Ht 70.0 in | Wt 320.0 lb

## 2018-02-10 DIAGNOSIS — M1710 Unilateral primary osteoarthritis, unspecified knee: Secondary | ICD-10-CM

## 2018-02-10 DIAGNOSIS — M1711 Unilateral primary osteoarthritis, right knee: Secondary | ICD-10-CM

## 2018-02-10 DIAGNOSIS — M1712 Unilateral primary osteoarthritis, left knee: Secondary | ICD-10-CM

## 2018-02-10 LAB — LIPID PANEL
CHOL/HDL RATIO: 2.9 ratio (ref 0.0–5.0)
CHOLESTEROL TOTAL: 142 mg/dL (ref 100–199)
HDL: 49 mg/dL (ref >39)
LDL Calculated: 72 mg/dL (ref 0–99)
TRIGLYCERIDES: 104 mg/dL (ref 0–149)
VLDL CHOLESTEROL CAL: 21 mg/dL (ref 5–40)

## 2018-02-10 LAB — CMP14+EGFR
ALBUMIN: 4.2 g/dL (ref 3.5–4.8)
ALK PHOS: 90 IU/L (ref 39–117)
ALT: 16 IU/L (ref 0–44)
AST: 21 IU/L (ref 0–40)
Albumin/Globulin Ratio: 2 (ref 1.2–2.2)
BUN / CREAT RATIO: 14 (ref 10–24)
BUN: 19 mg/dL (ref 8–27)
Bilirubin Total: 0.7 mg/dL (ref 0.0–1.2)
CHLORIDE: 102 mmol/L (ref 96–106)
CO2: 24 mmol/L (ref 20–29)
Calcium: 9.6 mg/dL (ref 8.6–10.2)
Creatinine, Ser: 1.32 mg/dL — ABNORMAL HIGH (ref 0.76–1.27)
GFR calc Af Amer: 62 mL/min/{1.73_m2} (ref >59)
GFR calc non Af Amer: 54 mL/min/{1.73_m2} — ABNORMAL LOW (ref >59)
GLOBULIN, TOTAL: 2.1 g/dL (ref 1.5–4.5)
Glucose: 101 mg/dL — ABNORMAL HIGH (ref 65–99)
Potassium: 4.3 mmol/L (ref 3.5–5.2)
SODIUM: 141 mmol/L (ref 134–144)
Total Protein: 6.3 g/dL (ref 6.0–8.5)

## 2018-02-10 LAB — CBC WITH DIFFERENTIAL/PLATELET
BASOS ABS: 0.1 10*3/uL (ref 0.0–0.2)
Basos: 1 %
EOS (ABSOLUTE): 0.3 10*3/uL (ref 0.0–0.4)
Eos: 4 %
HEMATOCRIT: 41.7 % (ref 37.5–51.0)
HEMOGLOBIN: 14.2 g/dL (ref 13.0–17.7)
Immature Grans (Abs): 0 10*3/uL (ref 0.0–0.1)
Immature Granulocytes: 0 %
LYMPHS ABS: 1.8 10*3/uL (ref 0.7–3.1)
Lymphs: 29 %
MCH: 30.1 pg (ref 26.6–33.0)
MCHC: 34.1 g/dL (ref 31.5–35.7)
MCV: 89 fL (ref 79–97)
MONOS ABS: 0.7 10*3/uL (ref 0.1–0.9)
Monocytes: 12 %
NEUTROS PCT: 54 %
Neutrophils Absolute: 3.3 10*3/uL (ref 1.4–7.0)
Platelets: 227 10*3/uL (ref 150–450)
RBC: 4.71 x10E6/uL (ref 4.14–5.80)
RDW: 12.8 % (ref 12.3–15.4)
WBC: 6.2 10*3/uL (ref 3.4–10.8)

## 2018-02-10 LAB — PSA, TOTAL AND FREE
PSA, Free Pct: 38.3 %
PSA, Free: 0.23 ng/mL
Prostate Specific Ag, Serum: 0.6 ng/mL (ref 0.0–4.0)

## 2018-02-10 LAB — VITAMIN D 25 HYDROXY (VIT D DEFICIENCY, FRACTURES): VIT D 25 HYDROXY: 49.3 ng/mL (ref 30.0–100.0)

## 2018-02-10 NOTE — Progress Notes (Signed)
Chief Complaint  Patient presents with  . knee injections    HPI  Patient presents today for arthritis pain in each knee.  PMH: Smoking status noted ROS: Per HPI  Objective: BP 123/66   Pulse 69   Temp 97.8 F (36.6 C) (Oral)   Ht 5\' 10"  (1.778 m)   Wt (!) 320 lb (145.2 kg)   BMI 45.92 kg/m  Gen: NAD, alert, cooperative with exam HEENT: NCAT, EOMI, PERRL CV: RRR, good S1/S2, no murmur Resp: CTABL, no wheezes, non-labored Ext: Crepitance in each knee. Bilateral joint hypertrophy noted. Neuro: Alert and oriented, No gross deficits A steroid injection was performed with sterile prep. Topical anesthesia.Performed at medial patellar plica bilaterally using 1% plain Lidocaine and 9 mg of Celestone. This was well tolerated. Blood loss < 1 ml. Assessment and plan:  1. Arthritis of knee     No orders of the defined types were placed in this encounter.   No orders of the defined types were placed in this encounter.   Follow up as needed.  Claretta Fraise, MD

## 2018-02-13 ENCOUNTER — Encounter: Payer: Self-pay | Admitting: Family Medicine

## 2018-02-15 ENCOUNTER — Encounter: Payer: Self-pay | Admitting: Family Medicine

## 2018-03-01 DIAGNOSIS — M170 Bilateral primary osteoarthritis of knee: Secondary | ICD-10-CM | POA: Diagnosis not present

## 2018-03-01 DIAGNOSIS — M25562 Pain in left knee: Secondary | ICD-10-CM | POA: Diagnosis not present

## 2018-03-01 DIAGNOSIS — M25561 Pain in right knee: Secondary | ICD-10-CM | POA: Diagnosis not present

## 2018-03-01 DIAGNOSIS — G8929 Other chronic pain: Secondary | ICD-10-CM | POA: Insufficient documentation

## 2018-03-02 DIAGNOSIS — B351 Tinea unguium: Secondary | ICD-10-CM | POA: Diagnosis not present

## 2018-03-02 DIAGNOSIS — M79676 Pain in unspecified toe(s): Secondary | ICD-10-CM | POA: Diagnosis not present

## 2018-03-03 ENCOUNTER — Other Ambulatory Visit: Payer: Self-pay | Admitting: Family Medicine

## 2018-04-02 ENCOUNTER — Ambulatory Visit (INDEPENDENT_AMBULATORY_CARE_PROVIDER_SITE_OTHER): Payer: Medicare HMO | Admitting: Family Medicine

## 2018-04-02 ENCOUNTER — Encounter: Payer: Self-pay | Admitting: Family Medicine

## 2018-04-02 VITALS — BP 139/74 | HR 73 | Temp 99.0°F | Ht 70.0 in | Wt 329.0 lb

## 2018-04-02 DIAGNOSIS — J208 Acute bronchitis due to other specified organisms: Secondary | ICD-10-CM

## 2018-04-02 DIAGNOSIS — B9689 Other specified bacterial agents as the cause of diseases classified elsewhere: Secondary | ICD-10-CM

## 2018-04-02 MED ORDER — BENZONATATE 100 MG PO CAPS: 100.0000 mg | ORAL_CAPSULE | Freq: Three times a day (TID) | ORAL | 0 refills | Status: DC | PRN

## 2018-04-02 MED ORDER — AZITHROMYCIN 250 MG PO TABS: ORAL_TABLET | ORAL | 0 refills | Status: DC

## 2018-04-02 NOTE — Progress Notes (Signed)
Subjective: CC: cough PCP: Claretta Fraise, MD Dennis Spencer is a 72 y.o. male presenting to clinic today for:  1. Cough Patient reports a 2-day history of productive cough with alternating yellow and brown phlegm.  He has a history of tracheostomy and states that his stoma has been somewhat irritated as well.  He does report scant blood from the stoma intermittently.  No fevers, chills nausea or vomiting.  He has a tracheostomy in place for obstructive sleep apnea.   ROS: Per HPI  Allergies  Allergen Reactions  . Lisinopril Other (See Comments)    DIZZINESS   Past Medical History:  Diagnosis Date  . Arthritis   . CKD (chronic kidney disease), stage III ([place]) 11/10/2013  . Gallstones   . GERD (gastroesophageal reflux disease)   . 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  . Hypercholesteremia   . Hypertension   . Kidney stones   . Osteomyelitis ([place])    LLE age 40  . Pneumonia 6-7 YRS AGO  . 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 Severe obesity (BMI >= 40) ([place]) 11/12/2013  . Sleep apnea    s/p tracheostomy, LEAVES TRACH OPEN AT NIGHT FOR SLEEP APNEA    Current Outpatient Medications:  .  amLODipine (NORVASC) 5 MG tablet, Take 1 tablet (5 mg total) by mouth every morning., Disp: 90 tablet, Rfl: 1 .  aspirin EC 81 MG tablet, Take 81 mg by mouth every morning. , Disp: , Rfl:  .  atorvastatin (LIPITOR) 80 MG tablet, TAKE 1 TABLET (80 MG TOTAL) BY MOUTH DAILY AT 6 PM., Disp: 90 tablet, Rfl: 1 .  b complex vitamins tablet, Take 1 tablet by mouth daily., Disp: , Rfl:  .  Calcium Citrate-Vitamin D (CITRACAL + D PO), Take 1 tablet by mouth 2 (two) times daily., Disp: , Rfl:  .  Cholecalciferol (VITAMIN D3) 2000 UNITS capsule, Take 2,000 Units by mouth 2 (two) times daily. , Disp: , Rfl:  .  gabapentin (NEURONTIN) 600  MG tablet, Take 0.5 tablets (300 mg total) by mouth daily., Disp: 45 tablet, Rfl: 1 .  Multiple Vitamin (MULTIVITAMIN WITH MINERALS) TABS tablet, Take 1 tablet by mouth daily., Disp: , Rfl:  .  omeprazole (PRILOSEC) 40 MG capsule, Take 1 capsule (40 mg total) by mouth daily., Disp: 90 capsule, Rfl: 3 .  vitamin C (ASCORBIC ACID) 500 MG tablet, Take 500 mg by mouth 2 (two) times daily., Disp: , Rfl:  Social History   Socioeconomic History  . Marital status: Married    Spouse name: Helene Kelp  . Number of children: 3  . Years of education: Not on file  . Highest education level: Not on file  Occupational History  . Occupation: Retired, English as a second language teacher.    Employer: [employer]  Social Needs  . Financial resource strain: Not on file  . Food insecurity:    Worry: Not on file    Inability: Not on file  . Transportation needs:    Medical: Not on file    Non-medical: Not on file  Tobacco Use  . Smoking status: Former Smoker    Packs/day: 1.00    Years: 8.00    Pack years: 8.00    Types: Cigarettes    Last attempt to quit: 05/12/1968    Years since quitting: 49.9  .  Smokeless tobacco: Never Used  Substance and Sexual Activity  . Alcohol use: No  . Drug use: No  . Sexual activity: Yes  Lifestyle  . Physical activity:    Days per week: Not on file    Minutes per session: Not on file  . Stress: Not on file  Relationships  . Social connections:    Talks on phone: Not on file    Gets together: Not on file    Attends religious service: Not on file    Active member of club or organization: Not on file    Attends meetings of clubs or organizations: Not on file    Relationship status: Not on file  . Intimate partner violence:    Fear of current or ex partner: Not on file    Emotionally abused: Not on file    Physically abused: Not on file    Forced sexual activity: Not on file  Other Topics Concern  . Not on file  Social History Narrative   Married.     Family History  Problem  Relation Age of Onset  . Cancer Mother        Ovarian, died age 2.  . Diabetes Father   . Hypertension Father   . Heart failure Father        Died of MI age 7.  . Breast cancer Maternal Grandmother   . Colon cancer Maternal Grandfather   . Esophageal cancer Neg Hx   . Stomach cancer Neg Hx   . Rectal cancer Neg Hx     Objective: Office vital signs reviewed. BP 139/74   Pulse 73   Temp 99 F (37.2 C) (Oral)   Ht 5\' 10"  (1.778 m)   Wt (!) 329 lb (149.2 kg)   SpO2 96%   BMI 47.21 kg/m   Physical Examination:  General: Awake, alert, morbidly obese, No acute distress HEENT: Normal    Neck: No masses palpated. No lymphadenopathy    Eyes: extraocular membranes intact, sclera white    Nose: no nasal discharge    Throat: moist mucus membranes Neck: Trach in place.  There was mild erythema at the stoma; no discharge noted.  Cardio: regular rate and rhythm, S1S2 heard, no murmurs appreciated Pulm: clear to auscultation bilaterally, no wheezes, rhonchi or rales; normal work of breathing on room air  Assessment/ Plan: 72 y.o. male   1. Acute bacterial bronchitis Patient is afebrile nontoxic-appearing.  His work of breathing and pulse ox on room air is within normal limits.  I do have concern for strep pneumoniae with reports of the brown sputum.  I discussed with the patient that this certainly may be dried blood from the frequent coughing and irritation by the trach.  However, given high risk of poor outcome should he develop a significant pulmonary infection we discussed empiric antibiotics with azithromycin to cover for possible early pneumonia.  Treating as an acute bacterial bronchitis.  Tessalon Perles prescribed.  Reasons for return and emergent evaluation emergency department discussed.  Patient was good understanding of follow-up PRN.   No orders of the defined types were placed in this encounter.  Meds ordered this encounter  Medications  . azithromycin (ZITHROMAX) 250  MG tablet    Sig: Take 2 tablets today, then take 1 tablet daily until gone.    Dispense:  6 tablet    Refill:  0  . benzonatate (TESSALON PERLES) 100 MG capsule    Sig: Take 1 capsule (100 mg total) by  mouth 3 (three) times daily as needed.    Dispense:  20 capsule    Refill:  Floyd, DO [place] [phone]

## 2018-04-02 NOTE — Patient Instructions (Addendum)
I am treating you as a bacterial bronchitis because you are having brown sputum.  This is typically suggestive of a strep pneumonia infection.  However, we did discuss this most certainly could be brown related to the the irritation and scant blood you have noticed from coughing related to your stoma.  We are empirically treating you with azithromycin.  I have also prescribed you Tessalon Perles to use if needed for cough.  If symptoms worsen, you develop shortness of breath, high fevers, chest pain or overt blood in your cough, please seek immediate medical attention.  You may use Mucinex for chest congestion.  Drink plenty of fluids.

## 2018-04-07 ENCOUNTER — Ambulatory Visit (INDEPENDENT_AMBULATORY_CARE_PROVIDER_SITE_OTHER): Payer: Medicare HMO | Admitting: Family Medicine

## 2018-04-07 ENCOUNTER — Encounter: Payer: Self-pay | Admitting: Family Medicine

## 2018-04-07 VITALS — BP 145/80 | HR 78 | Temp 97.8°F

## 2018-04-07 DIAGNOSIS — S61212A Laceration without foreign body of right middle finger without damage to nail, initial encounter: Secondary | ICD-10-CM

## 2018-04-07 NOTE — Progress Notes (Signed)
BP (!) 145/80   Pulse 78   Temp 97.8 F (36.6 C)    Subjective:    Patient ID: Dennis Spencer, male    DOB: 29-Mar-1946, 72 y.o.   MRN: [id number]  HPI: Dennis Spencer is a 72 y.o. male presenting on 04/07/2018 for finger lac (3rd R finger)   HPI Right middle finger laceration Patient comes in with complaints of a right middle finger laceration that he sustained while cutting vegetables with a slicer for their Thanksgiving dinner, he sliced off the tip of his right middle finger including that and of the fingernail in a horizontal fashion slicing mostly just below the fingernail into the fingernail and then out.  He did not go all the way to the bone but just slice that skin layer off just below the fingernail.  He says this happened just less than an hour ago and it has not been able to stop bleeding.  He is on aspirin but is not on any other blood thinners.  Relevant past medical, surgical, family and social history reviewed and updated as indicated. Interim medical history since our last visit reviewed. Allergies and medications reviewed and updated.  Review of Systems  Constitutional: Negative for chills and fever.  Respiratory: Negative for shortness of breath and wheezing.   Cardiovascular: Negative for chest pain and leg swelling.  Musculoskeletal: Negative for back pain and gait problem.  Skin: Positive for wound. Negative for rash.  All other systems reviewed and are negative.   Per HPI unless specifically indicated above   Allergies as of 04/07/2018      Reactions   Lisinopril Other (See Comments)   DIZZINESS      Medication List        Accurate as of 04/07/18  5:50 PM. Always use your most recent med list.          amLODipine 5 MG tablet Commonly known as:  NORVASC Take 1 tablet (5 mg total) by mouth every morning.   aspirin EC 81 MG tablet Take 81 mg by mouth every morning.   atorvastatin 80 MG tablet Commonly known as:  LIPITOR TAKE 1 TABLET (80  MG TOTAL) BY MOUTH DAILY AT 6 PM.   azithromycin 250 MG tablet Commonly known as:  ZITHROMAX Take 2 tablets today, then take 1 tablet daily until gone.   b complex vitamins tablet Take 1 tablet by mouth daily.   benzonatate 100 MG capsule Commonly known as:  TESSALON Take 1 capsule (100 mg total) by mouth 3 (three) times daily as needed.   CITRACAL + D PO Take 1 tablet by mouth 2 (two) times daily.   gabapentin 600 MG tablet Commonly known as:  NEURONTIN Take 0.5 tablets (300 mg total) by mouth daily.   multivitamin with minerals Tabs tablet Take 1 tablet by mouth daily.   omeprazole 40 MG capsule Commonly known as:  PRILOSEC Take 1 capsule (40 mg total) by mouth daily.   vitamin C 500 MG tablet Commonly known as:  ASCORBIC ACID Take 500 mg by mouth 2 (two) times daily.   Vitamin D3 50 MCG (2000 UT) capsule Take 2,000 Units by mouth 2 (two) times daily.          Objective:    BP (!) 145/80   Pulse 78   Temp 97.8 F (36.6 C)   Wt Readings from Last 3 Encounters:  04/02/18 (!) 329 lb (149.2 kg)  02/10/18 (!) 320 lb (145.2 kg)  02/09/18 Marland Kitchen)  320 lb (145.2 kg)    Physical Exam  Constitutional: He is oriented to person, place, and time. He appears well-developed and well-nourished. No distress.  Eyes: Conjunctivae are normal. No scleral icterus.  Neurological: He is alert and oriented to person, place, and time.  Skin: Skin is warm and dry. Laceration (Laceration shaving just below the skin on the tip of his right middle finger that is not very deep but shaved the distal third of his fingernail off and the skin below it on the medial half of his right fingernail) noted. No rash noted. He is not diaphoretic.  Psychiatric: He has a normal mood and affect. His behavior is normal.  Nursing note and vitals reviewed.  Attempted to use [place] and able to achieve most the hemostasis with this and then used electrocautery for the one spot in the center that was arterial  that was still oozing and did not get any improved hemostasis with that and will place a pressure dressing and have him come back in 2 days for redressing.     Assessment & Plan:   Problem List Items Addressed This Visit    None    Visit Diagnoses    Laceration of right middle finger, initial encounter    -  Primary   Use monsels and electrocauterization and almost able to achieve hemostasis, placed pressure dressing and will have come back in 2 days       Follow up plan: Return in about 2 days (around 04/09/2018), or if symptoms worsen or fail to improve, for Wound recheck and redressing.  Counseling provided for all of the vaccine components No orders of the defined types were placed in this encounter.   Caryl Pina, MD [facility] 04/07/2018, 5:50 PM

## 2018-04-09 ENCOUNTER — Encounter: Payer: Self-pay | Admitting: Family Medicine

## 2018-04-09 ENCOUNTER — Ambulatory Visit (INDEPENDENT_AMBULATORY_CARE_PROVIDER_SITE_OTHER): Payer: Medicare HMO | Admitting: Family Medicine

## 2018-04-09 VITALS — BP 144/66 | HR 71 | Temp 96.8°F | Ht 70.0 in | Wt 329.9 lb

## 2018-04-09 DIAGNOSIS — S61312D Laceration without foreign body of right middle finger with damage to nail, subsequent encounter: Secondary | ICD-10-CM

## 2018-04-09 MED ORDER — CEPHALEXIN 500 MG PO CAPS: 500.0000 mg | ORAL_CAPSULE | Freq: Four times a day (QID) | ORAL | 0 refills | Status: DC

## 2018-04-09 NOTE — Progress Notes (Signed)
BP (!) 144/66   Pulse 71   Temp (!) 96.8 F (36 C)   Ht 5\' 10"  (1.778 m)   Wt (!) 329 lb 14.4 oz (149.6 kg)   BMI 47.34 kg/m    Subjective:    Patient ID: Dennis Spencer, male    DOB: 1945-08-07, 72 y.o.   MRN: [id number]  HPI: Dennis Spencer is a 72 y.o. male presenting on 04/09/2018 for Hand Pain   HPI Follow-up right middle finger laceration Patient is coming in for follow-up of right middle finger laceration that he sustained 2 days ago while trying to use a chopper to cut vegetables for Thanksgiving dinner.  He shaved off part of his fingernail including the skin underneath on that right middle finger and had a pressure dressing applied to try to achieve hemostasis after using [place] and electrocautery.  He says he has not had any oozing or drainage out of the site and he has not had a significant amount of pain there either.  He denies any fevers or chills or redness or warmth or drainage.  Relevant past medical, surgical, family and social history reviewed and updated as indicated. Interim medical history since our last visit reviewed. Allergies and medications reviewed and updated.  Review of Systems  Constitutional: Negative for chills and fever.  Respiratory: Negative for shortness of breath and wheezing.   Cardiovascular: Negative for chest pain and leg swelling.  Musculoskeletal: Negative for back pain and gait problem.  Skin: Positive for wound. Negative for color change and rash.  All other systems reviewed and are negative.   Per HPI unless specifically indicated above   Allergies as of 04/09/2018      Reactions   Lisinopril Other (See Comments)   DIZZINESS      Medication List        Accurate as of 04/09/18 11:54 AM. Always use your most recent med list.          amLODipine 5 MG tablet Commonly known as:  NORVASC Take 1 tablet (5 mg total) by mouth every morning.   aspirin EC 81 MG tablet Take 81 mg by mouth every morning.   atorvastatin  80 MG tablet Commonly known as:  LIPITOR TAKE 1 TABLET (80 MG TOTAL) BY MOUTH DAILY AT 6 PM.   azithromycin 250 MG tablet Commonly known as:  ZITHROMAX Take 2 tablets today, then take 1 tablet daily until gone.   b complex vitamins tablet Take 1 tablet by mouth daily.   benzonatate 100 MG capsule Commonly known as:  TESSALON Take 1 capsule (100 mg total) by mouth 3 (three) times daily as needed.   cephALEXin 500 MG capsule Commonly known as:  KEFLEX Take 1 capsule (500 mg total) by mouth 4 (four) times daily.   CITRACAL + D PO Take 1 tablet by mouth 2 (two) times daily.   gabapentin 600 MG tablet Commonly known as:  NEURONTIN Take 0.5 tablets (300 mg total) by mouth daily.   multivitamin with minerals Tabs tablet Take 1 tablet by mouth daily.   omeprazole 40 MG capsule Commonly known as:  PRILOSEC Take 1 capsule (40 mg total) by mouth daily.   vitamin C 500 MG tablet Commonly known as:  ASCORBIC ACID Take 500 mg by mouth 2 (two) times daily.   Vitamin D3 50 MCG (2000 UT) capsule Take 2,000 Units by mouth 2 (two) times daily.          Objective:    BP Marland Kitchen)  144/66   Pulse 71   Temp (!) 96.8 F (36 C)   Ht 5\' 10"  (1.778 m)   Wt (!) 329 lb 14.4 oz (149.6 kg)   BMI 47.34 kg/m   Wt Readings from Last 3 Encounters:  04/09/18 (!) 329 lb 14.4 oz (149.6 kg)  04/02/18 (!) 329 lb (149.2 kg)  02/10/18 (!) 320 lb (145.2 kg)    Physical Exam  Constitutional: He is oriented to person, place, and time. He appears well-developed and well-nourished. No distress.  Eyes: Conjunctivae are normal. No scleral icterus.  Neurological: He is alert and oriented to person, place, and time. Coordination normal.  Skin: Skin is warm and dry. Laceration (No bleeding or signs of infection, re-wrap with triple antibiotic and nonstick dressing and change daily) noted. No rash noted. He is not diaphoretic.  Psychiatric: He has a normal mood and affect. His behavior is normal.  Nursing note  and vitals reviewed.     Assessment & Plan:   Problem List Items Addressed This Visit    None    Visit Diagnoses    Laceration of right middle finger without foreign body with damage to nail, subsequent encounter    -  Primary   Relevant Medications   cephALEXin (KEFLEX) 500 MG capsule      Follow up plan: Return if symptoms worsen or fail to improve.  Counseling provided for all of the vaccine components No orders of the defined types were placed in this encounter.  Caryl Pina, MD [facility] 04/09/2018, 11:54 AM

## 2018-04-13 ENCOUNTER — Emergency Department (HOSPITAL_COMMUNITY): Payer: Medicare HMO

## 2018-04-13 ENCOUNTER — Emergency Department (HOSPITAL_COMMUNITY)
Admission: EM | Admit: 2018-04-13 | Discharge: 2018-04-13 | Disposition: A | Discharge status: Home / Self Care | Payer: Medicare HMO | Attending: Emergency Medicine | Admitting: Emergency Medicine

## 2018-04-13 ENCOUNTER — Telehealth: Payer: Self-pay | Admitting: *Deleted

## 2018-04-13 ENCOUNTER — Encounter (HOSPITAL_COMMUNITY): Payer: Self-pay | Admitting: Emergency Medicine

## 2018-04-13 ENCOUNTER — Other Ambulatory Visit: Payer: Self-pay

## 2018-04-13 DIAGNOSIS — F17210 Nicotine dependence, cigarettes, uncomplicated: Secondary | ICD-10-CM | POA: Diagnosis not present

## 2018-04-13 DIAGNOSIS — Z7982 Long term (current) use of aspirin: Secondary | ICD-10-CM | POA: Diagnosis not present

## 2018-04-13 DIAGNOSIS — H5213 Myopia, bilateral: Secondary | ICD-10-CM | POA: Diagnosis not present

## 2018-04-13 DIAGNOSIS — H539 Unspecified visual disturbance: Secondary | ICD-10-CM | POA: Diagnosis not present

## 2018-04-13 DIAGNOSIS — Z79899 Other long term (current) drug therapy: Secondary | ICD-10-CM | POA: Diagnosis not present

## 2018-04-13 DIAGNOSIS — I129 Hypertensive chronic kidney disease with stage 1 through stage 4 chronic kidney disease, or unspecified chronic kidney disease: Secondary | ICD-10-CM | POA: Insufficient documentation

## 2018-04-13 DIAGNOSIS — H538 Other visual disturbances: Secondary | ICD-10-CM | POA: Diagnosis not present

## 2018-04-13 DIAGNOSIS — N183 Chronic kidney disease, stage 3 (moderate): Secondary | ICD-10-CM | POA: Diagnosis not present

## 2018-04-13 DIAGNOSIS — I6523 Occlusion and stenosis of bilateral carotid arteries: Secondary | ICD-10-CM | POA: Diagnosis not present

## 2018-04-13 DIAGNOSIS — Z8673 Personal history of transient ischemic attack (TIA), and cerebral infarction without residual deficits: Secondary | ICD-10-CM

## 2018-04-13 HISTORY — DX: Personal history of transient ischemic attack (TIA), and cerebral infarction without residual deficits: Z86.73

## 2018-04-13 LAB — URINALYSIS, ROUTINE W REFLEX MICROSCOPIC
BACTERIA UA: NONE SEEN
Bilirubin Urine: NEGATIVE
Glucose, UA: NEGATIVE mg/dL
Ketones, ur: NEGATIVE mg/dL
Nitrite: NEGATIVE
Protein, ur: NEGATIVE mg/dL
Specific Gravity, Urine: 1.011 (ref 1.005–1.030)
pH: 6 (ref 5.0–8.0)

## 2018-04-13 LAB — COMPREHENSIVE METABOLIC PANEL
ALT: 18 U/L (ref 0–44)
ANION GAP: 8 (ref 5–15)
AST: 30 U/L (ref 15–41)
Albumin: 3.9 g/dL (ref 3.5–5.0)
Alkaline Phosphatase: 78 U/L (ref 38–126)
BUN: 15 mg/dL (ref 8–23)
CO2: 26 mmol/L (ref 22–32)
Calcium: 9.1 mg/dL (ref 8.9–10.3)
Chloride: 105 mmol/L (ref 98–111)
Creatinine, Ser: 1.19 mg/dL (ref 0.61–1.24)
GFR calc Af Amer: >60 mL/min (ref >60)
Glucose, Bld: 109 mg/dL — ABNORMAL HIGH (ref 70–99)
Potassium: 4 mmol/L (ref 3.5–5.1)
Sodium: 139 mmol/L (ref 135–145)
Total Bilirubin: 0.7 mg/dL (ref 0.3–1.2)
Total Protein: 7.2 g/dL (ref 6.5–8.1)

## 2018-04-13 LAB — CBC
HEMATOCRIT: 45.1 % (ref 39.0–52.0)
HEMOGLOBIN: 14.4 g/dL (ref 13.0–17.0)
MCH: 29.3 pg (ref 26.0–34.0)
MCHC: 31.9 g/dL (ref 30.0–36.0)
MCV: 91.7 fL (ref 80.0–100.0)
Platelets: 246 10*3/uL (ref 150–400)
RBC: 4.92 MIL/uL (ref 4.22–5.81)
RDW: 13.2 % (ref 11.5–15.5)
WBC: 5.6 10*3/uL (ref 4.0–10.5)
nRBC: 0 % (ref 0.0–0.2)

## 2018-04-13 LAB — RAPID URINE DRUG SCREEN, HOSP PERFORMED
Amphetamines: NOT DETECTED
Barbiturates: NOT DETECTED
Benzodiazepines: NOT DETECTED
Cocaine: NOT DETECTED
Opiates: NOT DETECTED
Tetrahydrocannabinol: NOT DETECTED

## 2018-04-13 LAB — DIFFERENTIAL
Abs Immature Granulocytes: 0.01 10*3/uL (ref 0.00–0.07)
Basophils Absolute: 0 10*3/uL (ref 0.0–0.1)
Basophils Relative: 1 %
Eosinophils Absolute: 0.3 10*3/uL (ref 0.0–0.5)
Eosinophils Relative: 5 %
Immature Granulocytes: 0 %
Lymphocytes Relative: 35 %
Lymphs Abs: 2 10*3/uL (ref 0.7–4.0)
Monocytes Absolute: 0.7 10*3/uL (ref 0.1–1.0)
Monocytes Relative: 12 %
Neutro Abs: 2.7 10*3/uL (ref 1.7–7.7)
Neutrophils Relative %: 47 %

## 2018-04-13 LAB — PROTIME-INR
INR: 0.92
Prothrombin Time: 12.3 seconds (ref 11.4–15.2)

## 2018-04-13 LAB — I-STAT TROPONIN, ED: TROPONIN I, POC: 0 ng/mL (ref 0.00–0.08)

## 2018-04-13 LAB — APTT: aPTT: 32 seconds (ref 24–36)

## 2018-04-13 MED ORDER — SODIUM CHLORIDE 0.9 % IV BOLUS
500.0000 mL | Freq: Once | INTRAVENOUS | Status: AC
  Administered 2018-04-13: 500 mL via INTRAVENOUS

## 2018-04-13 MED ORDER — SODIUM CHLORIDE 0.9 % IV SOLN
100.0000 mL/h | INTRAVENOUS | Status: DC
  Administered 2018-04-13: 100 mL/h via INTRAVENOUS

## 2018-04-13 MED ORDER — IOPAMIDOL (ISOVUE-370) INJECTION 76%
100.0000 mL | Freq: Once | INTRAVENOUS | Status: AC | PRN
  Administered 2018-04-13: 75 mL via INTRAVENOUS

## 2018-04-13 NOTE — Telephone Encounter (Signed)
Dennis Spencer with [facility] called in to report pt is seeing Dr Radford Pax for exam Dr Radford Pax examined pt and thinks pt is having a TIA Pt instructed to go to ER for evaluation per Dr Livia Snellen

## 2018-04-13 NOTE — ED Provider Notes (Signed)
[facility] EMERGENCY DEPARTMENT Provider Note   CSN: [id number] Arrival date & time: 04/13/18  1621     History   Chief Complaint Chief Complaint  Patient presents with  . Eye Problem    HPI Dennis Spencer is a 72 y.o. male.  HPI Patient presents from his ophthalmologist office after reporting episodes of vision changes in the right eye over the past few months. Currently the patient is asymptomatic, denies acuity loss, or pain of the eyes, or any weakness anywhere. He has no history of stroke, nor arrhythmia, takes no blood thinning medication be on aspirin daily. He notes that over the past 2 months he has had episodes of a curtainlike sensation closing from the superior portion of his right eye in the visual field, lasting several moments, and receding without intervention. He has not seen medical providers during these episodes, but today, after discussing them with his ophthalmologist, he was sent here for evaluation.  Past Medical History:  Diagnosis Date  . Arthritis   . CKD (chronic kidney disease), stage III ([place]) 11/10/2013  . Gallstones   . GERD (gastroesophageal reflux disease)   . 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  . Hypercholesteremia   . Hypertension   . Kidney stones   . Osteomyelitis ([place])    LLE age 69  . Pneumonia 6-7 YRS AGO  . 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 Severe obesity (BMI >= 40) ([place]) 11/12/2013  . Sleep apnea    s/p tracheostomy, LEAVES TRACH OPEN AT NIGHT FOR SLEEP APNEA    Patient Active Problem List   Diagnosis Date Noted  . Severe obesity (BMI >= 40) ([place]) 11/12/2013  . GERD (gastroesophageal reflux disease) 11/10/2013  . OSA (obstructive sleep apnea) 11/10/2013  . CKD (chronic kidney disease), stage III ([place]) 11/10/2013  . Hyperglycemia 11/10/2013  .  Hypertension   . Hypercholesteremia     Past Surgical History:  Procedure Laterality Date  . CHOLECYSTECTOMY N/A 06/15/2015   Procedure: LAPAROSCOPIC CHOLECYSTECTOMY;  Surgeon: Ralene Ok, MD;  Location: [facility];  Service: General;  Laterality: N/A;  . COLONOSCOPY    . CYSTOSCOPY W/ URETERAL STENT REMOVAL Right 06/26/2017   Procedure: CYSTOSCOPY WITH STENT REMOVAL;  Surgeon: Alexis Frock, MD;  Location: [facility];  Service: Urology;  Laterality: Right;  . CYSTOSCOPY WITH RETROGRADE PYELOGRAM, URETEROSCOPY AND STENT PLACEMENT Bilateral 06/18/2013   Procedure: CYSTOSCOPY WITH RETROGRADE PYELOGRAM, Left URETEROSCOPY with laser , AND bilateral STENT PLACEMENT;  Surgeon: Alexis Frock, MD;  Location: [facility];  Service: Urology;  Laterality: Bilateral;  . CYSTOSCOPY WITH RETROGRADE PYELOGRAM, URETEROSCOPY AND STENT PLACEMENT Right 08/08/2013   Procedure: CYSTOSCOPY WITH RIGHT RETROGRADE PYELOGRAM, URETEROSCOPY AND STENT PLACEMENT, stone extraction;  Surgeon: Alexis Frock, MD;  Location: [facility];  Service: Urology;  Laterality: Right;  . CYSTOSCOPY WITH RETROGRADE PYELOGRAM, URETEROSCOPY AND STENT PLACEMENT Right 08/10/2013   Procedure: 2ND STAGE CYSTOSCOPY WITH RETROGRADE PYELOGRAM, URETEROSCOPY AND STENT EXCHANGE;  Surgeon: Alexis Frock, MD;  Location: [facility];  Service: Urology;  Laterality: Right;  . CYSTOSCOPY WITH RETROGRADE PYELOGRAM, URETEROSCOPY AND STENT PLACEMENT Bilateral 05/22/2017   Procedure: CYSTOSCOPY WITH RETROGRADE PYELOGRAM, URETEROSCOPY AND STENT PLACEMENT;  Surgeon: Alexis Frock, MD;  [facility]: [facility];  Service: Urology;  Laterality: Bilateral;  . CYSTOSCOPY WITH RETROGRADE PYELOGRAM, URETEROSCOPY AND STENT PLACEMENT Bilateral 06/05/2017  Procedure: CYSTOSCOPY WITH RETROGRADE PYELOGRAM, URETEROSCOPY AND STENT PLACEMENT;  Surgeon: Alexis Frock, MD;  [facility];  Service: Urology;  Laterality: Bilateral;  . CYSTOSCOPY/URETEROSCOPY/HOLMIUM LASER/STENT PLACEMENT Left  06/26/2017   Procedure: CYSTOSCOPY/URETEROSCOPY third stage/HOLMIUM LASER/STENT PLACEMENT retrograde pylegram;  Surgeon: Alexis Frock, MD;  Location: [facility];  Service: Urology;  Laterality: Left;  Marland Kitchen GASTRIC BYPASS     2004  . HOLMIUM LASER APPLICATION Bilateral 07/17/8586   Procedure: HOLMIUM LASER APPLICATION;  Surgeon: Alexis Frock, MD;  Location: [facility];  Service: Urology;  Laterality: Bilateral;  . HOLMIUM LASER APPLICATION Left 5/0/2774   Procedure: HOLMIUM LASER APPLICATION;  Surgeon: Alexis Frock, MD;  Location: [facility];  Service: Urology;  Laterality: Left;  . HOLMIUM LASER APPLICATION Bilateral 06/08/7865   Procedure: HOLMIUM LASER APPLICATION;  Surgeon: Alexis Frock, MD;  Location: [facility];  Service: Urology;  Laterality: Bilateral;  . LITHOTRIPSY    . LITHOTRIPSY    . NEPHROLITHOTOMY Left 08/08/2013   Procedure: 1ST STAGE NEPHROLITHOTOMY PERCUTANEOUS WITH SURGEON ACCESS;  Surgeon: Alexis Frock, MD;  Location: [facility];  Service: Urology;  Laterality: Left;  . NEPHROLITHOTOMY Left 08/10/2013   Procedure: NEPHROLITHOTOMY PERCUTANEOUS SECOND LOOK/LEFT DIGITAL URETEROSCOPY/BASKETING OF STONE/EXCHANGE OF LEFT URETERAL STENT;  Surgeon: Alexis Frock, MD;  Location: [facility];  Service: Urology;  Laterality: Left;  . PERCUTANEOUS NEPHROSTOLITHOTOMY    . roux en y     [facility] (weight lost 155 lbs.) Stable -has gained 20 lbs over desired 250#  desired level  . TONSILLECTOMY     child  . TONSILLECTOMY    . TRACHEOSTOMY     '87- s/p tongue and nasal septum deviation and Sleep apnea surgery        Home Medications    Prior to Admission medications   Medication Sig Start Date End Date Taking? Authorizing Provider  amLODipine (NORVASC) 5 MG tablet Take 1 tablet (5 mg total) by mouth every morning. 02/09/18  Yes Claretta Fraise, MD  aspirin EC 81 MG tablet Take 81 mg by mouth every morning.    Yes [provider]  atorvastatin (LIPITOR) 80 MG tablet TAKE 1 TABLET (80 MG  TOTAL) BY MOUTH DAILY AT 6 PM. 02/09/18  Yes Stacks, Cletus Gash, MD  b complex vitamins tablet Take 1 tablet by mouth daily.   Yes [provider]  benzonatate (TESSALON PERLES) 100 MG capsule Take 1 capsule (100 mg total) by mouth 3 (three) times daily as needed. 04/02/18  Yes Gottschalk, Leatrice Jewels M, DO  Calcium Citrate-Vitamin D (CITRACAL + D PO) Take 1 tablet by mouth 2 (two) times daily.   Yes [provider]  cephALEXin (KEFLEX) 500 MG capsule Take 1 capsule (500 mg total) by mouth 4 (four) times daily. 04/09/18  Yes Dettinger, Fransisca Kaufmann, MD  Cholecalciferol (VITAMIN D3) 2000 UNITS capsule Take 2,000 Units by mouth 2 (two) times daily.    Yes [provider]  gabapentin (NEURONTIN) 600 MG tablet Take 0.5 tablets (300 mg total) by mouth daily. 02/09/18  Yes StacksCletus Gash, MD  Multiple Vitamin (MULTIVITAMIN WITH MINERALS) TABS tablet Take 1 tablet by mouth daily.   Yes [provider]  omeprazole (PRILOSEC) 40 MG capsule Take 1 capsule (40 mg total) by mouth daily. 02/09/18  Yes Claretta Fraise, MD  vitamin C (ASCORBIC ACID) 500 MG tablet Take 500 mg by mouth 2 (two) times daily.   Yes [provider]  azithromycin (ZITHROMAX) 250 MG tablet Take 2 tablets today, then take 1 tablet daily until gone.  Patient not taking: Reported on 04/13/2018 04/02/18   Janora Norlander, DO    Family History Family History  Problem Relation Age of Onset  . Cancer Mother        Ovarian, died age 80.  . Diabetes Father   . Hypertension Father   . Heart failure Father        Died of MI age 47.  . Breast cancer Maternal Grandmother   . Colon cancer Maternal Grandfather   . Esophageal cancer Neg Hx   . Stomach cancer Neg Hx   . Rectal cancer Neg Hx     Social History Social History   Tobacco Use  . Smoking status: Former Smoker    Packs/day: 1.00    Years: 8.00    Pack years: 8.00    Types: Cigarettes    Last attempt to quit: 05/12/1968    Years since quitting:  49.9  . Smokeless tobacco: Never Used  Substance Use Topics  . Alcohol use: No  . Drug use: No     Allergies   Lisinopril   Review of Systems Review of Systems  Constitutional:       Per HPI, otherwise negative  HENT:       Per HPI, otherwise negative  Eyes: Positive for visual disturbance.  Respiratory:       Per HPI, otherwise negative  Cardiovascular:       Per HPI, otherwise negative  Gastrointestinal: Negative for vomiting.  Endocrine:       Negative aside from HPI  Genitourinary:       Neg aside from HPI   Musculoskeletal:       Per HPI, otherwise negative  Skin: Negative.   Neurological: Negative for syncope.     Physical Exam Updated Vital Signs BP (!) 146/68   Pulse 72   Temp 98.2 F (36.8 C) (Oral)   Resp (!) 23   Ht 5\' 10"  (1.778 m)   Wt (!) 142.9 kg   SpO2 98%   BMI 45.20 kg/m   Physical Exam  Constitutional: He is oriented to person, place, and time. He appears well-developed. No distress.  Obese elderly male awake and alert speaking clearly  HENT:  Head: Normocephalic and atraumatic.  Eyes: Pupils are equal, round, and reactive to light. Conjunctivae and EOM are normal. Right eye exhibits no discharge. Left eye exhibits no discharge.  Neck:  Tracheostomy otherwise unremarkable  Cardiovascular: Normal rate and regular rhythm.  Pulmonary/Chest: Effort normal. No stridor. No respiratory distress.  Abdominal: He exhibits no distension.  Musculoskeletal: He exhibits no edema.  Neurological: He is alert and oriented to person, place, and time. He displays no atrophy and no tremor. No cranial nerve deficit. He exhibits normal muscle tone. He displays no seizure activity. Coordination normal.  Skin: Skin is warm and dry.  Psychiatric: He has a normal mood and affect.  Nursing note and vitals reviewed.    ED Treatments / Results  Labs (all labs ordered are listed, but only abnormal results are displayed) Labs Reviewed  COMPREHENSIVE  METABOLIC PANEL - Abnormal; Notable for the following components:      Result Value   Glucose, Bld 109 (*)    All other components within normal limits  URINALYSIS, ROUTINE W REFLEX MICROSCOPIC - Abnormal; Notable for the following components:   APPearance HAZY (*)    Hgb urine dipstick SMALL (*)    Leukocytes, UA TRACE (*)    All other components within normal limits  PROTIME-INR  APTT  CBC  DIFFERENTIAL  RAPID URINE DRUG SCREEN, HOSP PERFORMED  I-STAT TROPONIN, ED    EKG None  Radiology Ct Angio Head W Or Wo Contrast  Result Date: 04/13/2018 CLINICAL DATA:  72 y/o M; visual changes. Rule out arterial stricture/occlusion, head and neck. EXAM: CT ANGIOGRAPHY HEAD AND NECK TECHNIQUE: Multidetector CT imaging of the head and neck was performed using the standard protocol during bolus administration of intravenous contrast. Multiplanar CT image reconstructions and MIPs were obtained to evaluate the vascular anatomy. Carotid stenosis measurements (when applicable) are obtained utilizing NASCET criteria, using the distal internal carotid diameter as the denominator. CONTRAST:  56mL ISOVUE-370 IOPAMIDOL (ISOVUE-370) INJECTION 76% COMPARISON:  None. FINDINGS: CT HEAD FINDINGS Brain: No evidence of acute infarction, hemorrhage, hydrocephalus, extra-axial collection or mass lesion/mass effect. 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 Nonspecific white matter hypodensities are compatible with mild chronic microvascular ischemic changes and there is mild volume loss of the brain for age. Vascular: As below. Skull: Normal. Negative for fracture or focal lesion. Sinuses: Imaged portions are clear. Orbits: No acute finding. Review of the MIP images confirms the above findings CTA NECK FINDINGS Aortic arch: Standard branching. Imaged portion shows no evidence of aneurysm or dissection. No significant stenosis of the major arch vessel origins. Mild calcific atherosclerosis. Right carotid system:  No evidence of dissection, occlusion, or aneurysm. Calcified plaque of the carotid bifurcation with mild to moderate 50% proximal ICA stenosis. Left carotid system: No evidence of dissection, stenosis (50% or greater) or occlusion. Calcified plaque of the carotid bifurcation with mild less than 50% proximal ICA stenosis. Retropharyngeal course of the proximal cervical ICA. Vertebral arteries: Left dominant. No evidence of dissection, stenosis (50% or greater) or occlusion. Skeleton: Mild-to-moderate cervical spondylosis. No high-grade bony canal stenosis. Other neck: Negative. Upper chest: Tracheostomy tube noted extending into the upper thoracic esophagus. Review of the MIP images confirms the above findings CTA HEAD FINDINGS Anterior circulation: No significant stenosis, proximal occlusion, aneurysm, or vascular malformation. Extensive calcific atherosclerosis of carotid siphons with mild bilateral paraclinoid ICA stenosis. Posterior circulation: No significant stenosis, proximal occlusion, aneurysm, or vascular malformation. Venous sinuses: As permitted by contrast timing, patent. Anatomic variants: Fetal right PCA. Delayed phase: No abnormal intracranial enhancement. Review of the MIP images confirms the above findings IMPRESSION: CT head: 1. No acute intracranial process or abnormal enhancement. 2. Mild chronic microvascular ischemic changes and volume loss of the brain. 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 CTA neck: 1. Mild to moderate 50% proximal right ICA stenosis with calcified plaque. 2. Mild less 50% proximal left ICA stenosis with calcified plaque. 3. Patent bilateral vertebral arteries. No significant stenosis by NASCET criteria. 4.  Aortic Atherosclerosis (ICD10-I70.0). 5. Mild-to-moderate cervical spondylosis. CTA head: 1. Patent anterior and posterior circulation. No large vessel occlusion, aneurysm, or significant stenosis is identified. 2. Extensive calcific atherosclerosis of carotid  siphons with mild bilateral paraclinoid ICA stenosis. Electronically Signed   By: Kristine Garbe M.D.   On: 04/13/2018 21:08   Ct Angio Neck W And/or Wo Contrast  Result Date: 04/13/2018 CLINICAL DATA:  72 y/o M; visual changes. Rule out arterial stricture/occlusion, head and neck. EXAM: CT ANGIOGRAPHY HEAD AND NECK TECHNIQUE: Multidetector CT imaging of the head and neck was performed using the standard protocol during bolus administration of intravenous contrast. Multiplanar CT image reconstructions and MIPs were obtained to evaluate the vascular anatomy. Carotid stenosis measurements (when applicable) are obtained utilizing NASCET criteria, using the distal internal carotid diameter  as the denominator. CONTRAST:  44mL ISOVUE-370 IOPAMIDOL (ISOVUE-370) INJECTION 76% COMPARISON:  None. FINDINGS: CT HEAD FINDINGS Brain: No evidence of acute infarction, hemorrhage, hydrocephalus, extra-axial collection or mass lesion/mass effect. 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 Nonspecific white matter hypodensities are compatible with mild chronic microvascular ischemic changes and there is mild volume loss of the brain for age. Vascular: As below. Skull: Normal. Negative for fracture or focal lesion. Sinuses: Imaged portions are clear. Orbits: No acute finding. Review of the MIP images confirms the above findings CTA NECK FINDINGS Aortic arch: Standard branching. Imaged portion shows no evidence of aneurysm or dissection. No significant stenosis of the major arch vessel origins. Mild calcific atherosclerosis. Right carotid system: No evidence of dissection, occlusion, or aneurysm. Calcified plaque of the carotid bifurcation with mild to moderate 50% proximal ICA stenosis. Left carotid system: No evidence of dissection, stenosis (50% or greater) or occlusion. Calcified plaque of the carotid bifurcation with mild less than 50% proximal ICA stenosis. Retropharyngeal course of the proximal cervical  ICA. Vertebral arteries: Left dominant. No evidence of dissection, stenosis (50% or greater) or occlusion. Skeleton: Mild-to-moderate cervical spondylosis. No high-grade bony canal stenosis. Other neck: Negative. Upper chest: Tracheostomy tube noted extending into the upper thoracic esophagus. Review of the MIP images confirms the above findings CTA HEAD FINDINGS Anterior circulation: No significant stenosis, proximal occlusion, aneurysm, or vascular malformation. Extensive calcific atherosclerosis of carotid siphons with mild bilateral paraclinoid ICA stenosis. Posterior circulation: No significant stenosis, proximal occlusion, aneurysm, or vascular malformation. Venous sinuses: As permitted by contrast timing, patent. Anatomic variants: Fetal right PCA. Delayed phase: No abnormal intracranial enhancement. Review of the MIP images confirms the above findings IMPRESSION: CT head: 1. No acute intracranial process or abnormal enhancement. 2. Mild chronic microvascular ischemic changes and volume loss of the brain. 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 CTA neck: 1. Mild to moderate 50% proximal right ICA stenosis with calcified plaque. 2. Mild less 50% proximal left ICA stenosis with calcified plaque. 3. Patent bilateral vertebral arteries. No significant stenosis by NASCET criteria. 4.  Aortic Atherosclerosis (ICD10-I70.0). 5. Mild-to-moderate cervical spondylosis. CTA head: 1. Patent anterior and posterior circulation. No large vessel occlusion, aneurysm, or significant stenosis is identified. 2. Extensive calcific atherosclerosis of carotid siphons with mild bilateral paraclinoid ICA stenosis. Electronically Signed   By: Kristine Garbe M.D.   On: 04/13/2018 21:08    Procedures Procedures (including critical care time)  Medications Ordered in ED Medications  sodium chloride 0.9 % bolus 500 mL (0 mLs Intravenous Stopped 04/13/18 1823)    Followed by  0.9 %  sodium chloride infusion (100 mL/hr  Intravenous New Bag/Given 04/13/18 1823)  iopamidol (ISOVUE-370) 76 % injection 100 mL (75 mLs Intravenous Contrast Given 04/13/18 1905)     Initial Impression / Assessment and Plan / ED Course  I have reviewed the triage vital signs and the nursing notes.  Pertinent labs & imaging results that were available during my care of the patient were reviewed by me and considered in my medical decision making (see chart for details).     9:51 PM On repeat exam the patient is awake, alert, in no distress. I reviewed the CT imaging, agree with interpretation. I subsequently discussed it with the patient and his wife. He continues to have no complaints. We discussed possibility of prior stroke given the abnormal head CT, as well as stenosis identified in both carotid arteries. No complete occlusion anywhere, and with no ongoing place, the  patient is appropriate for close outpatient follow-up to which he is amenable. Patient will however, increase his aspirin from 81 mg to 325 daily pending outpatient follow-up.   Final Clinical Impressions(s) / ED Diagnoses   Final diagnoses:  Vision changes     Carmin Muskrat, MD 04/13/18 2151

## 2018-04-13 NOTE — ED Triage Notes (Signed)
PT states he went to Dr. Radford Pax (opthamolgy) in [place], [place] today and told him he had some visual changes at times over the past few months. He was referred to ED to rule out carotid blockages and TIAs.

## 2018-04-13 NOTE — Discharge Instructions (Addendum)
As discussed, your evaluation today has been largely reassuring.  But, it is important that you monitor your condition carefully, and do not hesitate to return to the ED if you develop new, or concerning changes in your condition.  Otherwise, please follow-up with your physician for appropriate ongoing care.  Please increase your daily aspirin dose from 81 mg to 325 mg. In addition, please be sure to take this paperwork with you to your outpatient follow-up, as the results from your CT imaging is included below.   IMPRESSION: CT head:   1. No acute intracranial process or abnormal enhancement. 2. Mild chronic microvascular ischemic changes and volume loss of the brain. 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   CTA neck:   1. Mild to moderate 50% proximal right ICA stenosis with calcified plaque. 2. Mild less 50% proximal left ICA stenosis with calcified plaque. 3. Patent bilateral vertebral arteries. No significant stenosis by NASCET criteria. 4.  Aortic Atherosclerosis (ICD10-I70.0). 5. Mild-to-moderate cervical spondylosis.   CTA head:   1. Patent anterior and posterior circulation. No large vessel occlusion, aneurysm, or significant stenosis is identified. 2. Extensive calcific atherosclerosis of carotid siphons with mild bilateral paraclinoid ICA stenosis.

## 2018-04-19 ENCOUNTER — Ambulatory Visit (INDEPENDENT_AMBULATORY_CARE_PROVIDER_SITE_OTHER): Payer: Medicare HMO | Admitting: Family Medicine

## 2018-04-19 ENCOUNTER — Encounter: Payer: Self-pay | Admitting: Family Medicine

## 2018-04-19 VITALS — BP 138/68 | HR 70 | Temp 97.2°F | Ht 70.0 in | Wt 329.0 lb

## 2018-04-19 DIAGNOSIS — I6381 Other cerebral infarction due to occlusion or stenosis of small artery: Secondary | ICD-10-CM

## 2018-04-19 DIAGNOSIS — I6930 Unspecified sequelae of cerebral infarction: Secondary | ICD-10-CM

## 2018-04-19 MED ORDER — CLOPIDOGREL BISULFATE 75 MG PO TABS: 75.0000 mg | ORAL_TABLET | Freq: Every day | ORAL | 3 refills | Status: DC

## 2018-04-19 MED ORDER — PANTOPRAZOLE SODIUM 40 MG PO TBEC: 40.0000 mg | DELAYED_RELEASE_TABLET | Freq: Every day | ORAL | 11 refills | Status: DC

## 2018-04-19 NOTE — Progress Notes (Signed)
Subjective:  Patient ID: Dennis Spencer, male    DOB: 06/29/1945  Age: 72 y.o. MRN: [id number]  CC: Hospitalization Follow-up ([place] 04/13/18- vision changes )   HPI Dennis Spencer presents for patient was sent to the emergency room by his eye doctor after relating that he was having 32nd long episodes occasionally of a gray cloud coming down over the top of his vision.  Eye exam was normal.  Multiple tests were run for abnormalities particularly with the retina and detachment.  Since this was normal he was sent to the emergency room.  These episodes have not been associated with numbness or weakness or any lateralizing neurologic defect.  He notes no change in his hearing taste or speech.  His gait is normal.  When he was in the emergency room he had CT scans that showed he had had a lacunar infarct of the caudate nucleus.  This was considered to be old.  There was no edema.  He also was noted to have 50% blockages of the internal carotid artery bilaterally.  Dopplers for flow were recommended.  Lacunar infarct lacunar infarct  Depression screen [name] 2/9 04/19/2018 04/09/2018 04/02/2018  Decreased Interest 0 0 1  Down, Depressed, Hopeless 0 0 0  PHQ - 2 Score 0 0 1  Altered sleeping - - 0  Tired, decreased energy - - 0  Change in appetite - - 0  Feeling bad or failure about yourself  - - 0  Trouble concentrating - - 0  Moving slowly or fidgety/restless - - 0  Suicidal thoughts - - 0  PHQ-9 Score - - 1    History Dennis Spencer has a past medical history of Arthritis, CKD (chronic kidney disease), stage III ([place]) (11/10/2013), Gallstones, GERD (gastroesophageal reflux disease), History of kidney stones (08-04-13), Hypercholesteremia, Hypertension, Kidney stones, Osteomyelitis ([place]), Pneumonia (6-7 YRS AGO), 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   He has a past surgical history that includes Lithotripsy; Lithotripsy;  Percutaneous nephrostolithotomy; Cystoscopy with retrograde pyelogram, ureteroscopy and stent placement (Bilateral, 06/18/2013); Holmium laser application (Bilateral, 06/18/2013); roux en y; Tracheostomy; Tonsillectomy; Nephrolithotomy (Left, 08/08/2013); Cystoscopy with retrograde pyelogram, ureteroscopy and stent placement (Right, 08/08/2013); Nephrolithotomy (Left, 08/10/2013); Cystoscopy with retrograde pyelogram, ureteroscopy and stent placement (Right, 08/10/2013); Holmium laser application (Left, 06/18/2534); Cholecystectomy (N/A, 06/15/2015); Colonoscopy; Tonsillectomy; Gastric bypass; Cystoscopy with retrograde pyelogram, ureteroscopy and stent placement (Bilateral, 05/22/2017); Holmium laser application (Bilateral, 05/22/2017); Cystoscopy with retrograde pyelogram, ureteroscopy and stent placement (Bilateral, 06/05/2017); Cystoscopy/ureteroscopy/holmium laser/stent placement (Left, 06/26/2017); and Cystoscopy w/ ureteral stent removal (Right, 06/26/2017).   His family history includes Breast cancer in his maternal grandmother; Cancer in his mother; Colon cancer in his maternal grandfather; Diabetes in his father; Heart failure in his father; Hypertension in his father.He reports that he quit smoking about 49 years ago. His smoking use included cigarettes. He has a 8.00 pack-year smoking history. He has never used smokeless tobacco. He reports that he does not drink alcohol or use drugs.    ROS Review of Systems  Constitutional: Negative.   HENT: Negative.   Eyes: Negative for visual disturbance.  Respiratory: Negative for cough and shortness of breath.   Cardiovascular: Negative for chest pain and leg swelling.  Gastrointestinal: Negative for abdominal pain, diarrhea, nausea and vomiting.  Genitourinary: Negative for difficulty urinating.  Musculoskeletal: Negative for arthralgias and myalgias.  Skin: Negative for rash.  Neurological: Negative for headaches.  Psychiatric/Behavioral: Negative for sleep  disturbance.    Objective:  BP 138/68 (BP Location: Right Arm, Cuff Size: Large)   Pulse 70   Temp (!) 97.2 F (36.2 C) (Oral)   Ht 5\' 10"  (1.778 m)   Wt (!) 329 lb (149.2 kg)   BMI 47.21 kg/m   BP Readings from Last 3 Encounters:  04/19/18 138/68  04/13/18 (!) 146/68  04/09/18 (!) 144/66    Wt Readings from Last 3 Encounters:  04/19/18 (!) 329 lb (149.2 kg)  04/13/18 (!) 315 lb (142.9 kg)  04/09/18 (!) 329 lb 14.4 oz (149.6 kg)     Physical Exam  Constitutional: He is oriented to person, place, and time. He appears well-developed and well-nourished. No distress.  HENT:  Head: Normocephalic and atraumatic.  Right Ear: External ear normal.  Left Ear: External ear normal.  Nose: Nose normal.  Mouth/Throat: Oropharynx is clear and moist.  Eyes: Pupils are equal, round, and reactive to light. Conjunctivae and EOM are normal.  Neck: Normal range of motion. Neck supple.  Cardiovascular: Normal rate, regular rhythm and normal heart sounds.  No murmur heard. Pulmonary/Chest: Effort normal and breath sounds normal. No respiratory distress. He has no wheezes. He has no rales.  Abdominal: Soft. There is no tenderness.  Musculoskeletal: Normal range of motion.  Neurological: He is alert and oriented to person, place, and time. He has normal reflexes.  Skin: Skin is warm and dry.  Psychiatric: He has a normal mood and affect. His behavior is normal. Judgment and thought content normal.      Assessment & Plan:   Marshall was seen today for hospitalization follow-up.  Diagnoses and all orders for this visit:  Lacunar infarction [facility]) -     US Carotid Bilateral; Future  Other orders -     pantoprazole (PROTONIX) 40 MG tablet; Take 1 tablet (40 mg total) by mouth daily. For stomach -     clopidogrel (PLAVIX) 75 MG tablet; Take 1 tablet (75 mg total) by mouth daily.       I have discontinued Madelon Lips. Cregg's aspirin EC, omeprazole, azithromycin, and benzonatate. I am  also having him start on pantoprazole and clopidogrel. Additionally, I am having him maintain his vitamin C, Vitamin D3, multivitamin with minerals, b complex vitamins, Calcium Citrate-Vitamin D (CITRACAL + D PO), gabapentin, atorvastatin, amLODipine, cephALEXin, and aspirin.  Allergies as of 04/19/2018      Reactions   Lisinopril Other (See Comments)   DIZZINESS      Medication List        Accurate as of 04/19/18  5:48 PM. Always use your most recent med list.          amLODipine 5 MG tablet Commonly known as:  NORVASC Take 1 tablet (5 mg total) by mouth every morning.   aspirin 325 MG EC tablet Take 325 mg by mouth daily.   atorvastatin 80 MG tablet Commonly known as:  LIPITOR TAKE 1 TABLET (80 MG TOTAL) BY MOUTH DAILY AT 6 PM.   b complex vitamins tablet Take 1 tablet by mouth daily.   cephALEXin 500 MG capsule Commonly known as:  KEFLEX Take 1 capsule (500 mg total) by mouth 4 (four) times daily.   CITRACAL + D PO Take 1 tablet by mouth 2 (two) times daily.   clopidogrel 75 MG tablet Commonly known as:  PLAVIX Take 1 tablet (75 mg total) by mouth daily.   gabapentin 600 MG tablet Commonly known as:  NEURONTIN Take 0.5 tablets (300 mg total)  by mouth daily.   multivitamin with minerals Tabs tablet Take 1 tablet by mouth daily.   pantoprazole 40 MG tablet Commonly known as:  PROTONIX Take 1 tablet (40 mg total) by mouth daily. For stomach   vitamin C 500 MG tablet Commonly known as:  ASCORBIC ACID Take 500 mg by mouth 2 (two) times daily.   Vitamin D3 50 MCG (2000 UT) capsule Take 2,000 Units by mouth 2 (two) times daily.        Follow-up: No follow-ups on file.  Claretta Fraise, M.D.

## 2018-04-23 ENCOUNTER — Ambulatory Visit (HOSPITAL_COMMUNITY)
Admission: RE | Admit: 2018-04-23 | Discharge: 2018-04-23 | Disposition: A | Discharge status: Home / Self Care | Payer: Medicare HMO | Source: Ambulatory Visit | Attending: Family Medicine | Admitting: Family Medicine

## 2018-04-23 DIAGNOSIS — I6381 Other cerebral infarction due to occlusion or stenosis of small artery: Secondary | ICD-10-CM | POA: Insufficient documentation

## 2018-04-23 DIAGNOSIS — I63233 Cerebral infarction due to unspecified occlusion or stenosis of bilateral carotid arteries: Secondary | ICD-10-CM | POA: Diagnosis not present

## 2018-05-25 DIAGNOSIS — B351 Tinea unguium: Secondary | ICD-10-CM | POA: Diagnosis not present

## 2018-05-25 DIAGNOSIS — M79676 Pain in unspecified toe(s): Secondary | ICD-10-CM | POA: Diagnosis not present

## 2018-05-28 ENCOUNTER — Encounter: Payer: Self-pay | Admitting: Family Medicine

## 2018-05-28 ENCOUNTER — Ambulatory Visit (INDEPENDENT_AMBULATORY_CARE_PROVIDER_SITE_OTHER): Payer: Medicare HMO | Admitting: Family Medicine

## 2018-05-28 VITALS — BP 152/74 | HR 85 | Temp 98.2°F | Ht 70.0 in | Wt 327.0 lb

## 2018-05-28 DIAGNOSIS — I10 Essential (primary) hypertension: Secondary | ICD-10-CM

## 2018-05-28 DIAGNOSIS — R739 Hyperglycemia, unspecified: Secondary | ICD-10-CM | POA: Diagnosis not present

## 2018-05-28 DIAGNOSIS — I6930 Unspecified sequelae of cerebral infarction: Secondary | ICD-10-CM

## 2018-05-28 DIAGNOSIS — I6381 Other cerebral infarction due to occlusion or stenosis of small artery: Secondary | ICD-10-CM

## 2018-05-28 LAB — URINALYSIS
Bilirubin, UA: NEGATIVE
Glucose, UA: NEGATIVE
Ketones, UA: NEGATIVE
NITRITE UA: NEGATIVE
Specific Gravity, UA: 1.02 (ref 1.005–1.030)
UUROB: 1 mg/dL (ref 0.2–1.0)
pH, UA: 5.5 (ref 5.0–7.5)

## 2018-05-28 LAB — BAYER DCA HB A1C WAIVED: HB A1C (BAYER DCA - WAIVED): 5.8 % (ref <7.0)

## 2018-05-28 MED ORDER — TICAGRELOR 60 MG PO TABS: 60.0000 mg | ORAL_TABLET | Freq: Two times a day (BID) | ORAL | 2 refills | Status: DC

## 2018-05-28 NOTE — Progress Notes (Signed)
Subjective:  Patient ID: Dennis Spencer,  male    DOB: July 09, 1945  Age: 73 y.o.    CC: Medication Reaction (pt here today c/o red spots, bruising on abdomen, dark urine and general weakness since starting Plavix)   HPI Dennis Spencer presents for  follow-up of hypertension. Patient has no history of headache chest pain or shortness of breath or recent cough. Patient also denies symptoms of TIA such as numbness weakness lateralizing. Patient denies side effects from medication. States taking it regularly.  Patient also  in for follow-up of elevated cholesterol. Doing well without complaints on current medication. Denies side effects  including myalgia and arthralgia and nausea. Also in today for liver function testing. Currently no chest pain, shortness of breath or other cardiovascular related symptoms noted. Patient notes that he has had significant bruising since starting the Plavix.  No new stroke symptoms.  He does feel that it is impacted his energy significantly for the negative.  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   He has a past surgical history that includes Lithotripsy; Lithotripsy; Percutaneous nephrostolithotomy; Cystoscopy with retrograde pyelogram, ureteroscopy and stent placement (Bilateral, 06/18/2013); Holmium laser application (Bilateral, 06/18/2013); roux en y; Tracheostomy; Tonsillectomy; Nephrolithotomy (Left, 08/08/2013); Cystoscopy with retrograde pyelogram, ureteroscopy and stent placement (Right, 08/08/2013); Nephrolithotomy (Left, 08/10/2013); Cystoscopy with retrograde pyelogram, ureteroscopy and stent placement  (Right, 08/10/2013); Holmium laser application (Left, 8/0/9983); Cholecystectomy (N/A, 06/15/2015); Colonoscopy; Tonsillectomy; Gastric bypass; Cystoscopy with retrograde pyelogram, ureteroscopy and stent placement (Bilateral, 05/22/2017); Holmium laser application (Bilateral, 05/22/2017); Cystoscopy with retrograde pyelogram, ureteroscopy and stent placement (Bilateral, 06/05/2017); Cystoscopy/ureteroscopy/holmium laser/stent placement (Left, 06/26/2017); and Cystoscopy w/ ureteral stent removal (Right, 06/26/2017).   His family history includes Breast cancer in his maternal grandmother; Cancer in his mother; Colon cancer in his maternal grandfather; Diabetes in his father; Heart failure in his father; Hypertension in his father.He reports that he quit smoking about 50 years ago. His smoking use included cigarettes. He has a 8.00 pack-year smoking history. He has never used smokeless tobacco. He reports that he does not drink alcohol or use drugs.  Current Outpatient Medications on File Prior to Visit  Medication Sig Dispense Refill  . amLODipine (NORVASC) 5 MG tablet Take 1 tablet (5 mg total) by mouth every morning. 90 tablet 1  . aspirin 325 MG EC tablet Take 325 mg by mouth daily.    Marland Kitchen atorvastatin (LIPITOR) 80 MG tablet TAKE 1 TABLET (80 MG TOTAL) BY MOUTH DAILY AT 6 PM. 90 tablet 1  . b complex vitamins tablet Take 1 tablet by mouth daily.    . Calcium Citrate-Vitamin D (CITRACAL + D PO) Take 1 tablet by mouth 2 (two) times daily.    . Cholecalciferol (VITAMIN D3) 2000 UNITS capsule Take 2,000 Units by mouth 2 (two) times daily.     Marland Kitchen gabapentin (NEURONTIN) 600 MG tablet Take 0.5 tablets (300 mg total) by mouth daily. 45 tablet 1  . Multiple Vitamin (MULTIVITAMIN WITH MINERALS) TABS tablet Take 1 tablet by mouth daily.    . pantoprazole (PROTONIX) 40 MG tablet Take 1 tablet (40 mg total) by mouth daily. For stomach 30 tablet 11  . vitamin C (ASCORBIC ACID) 500 MG tablet Take 500 mg  by mouth 2 (two)  times daily.     No current facility-administered medications on file prior to visit.     ROS Review of Systems  Constitutional: Negative.   HENT: Negative.   Eyes: Negative for visual disturbance.  Respiratory: Negative for cough and shortness of breath.   Cardiovascular: Negative for chest pain and leg swelling.  Gastrointestinal: Negative for abdominal pain, diarrhea, nausea and vomiting.  Genitourinary: Positive for hematuria. Negative for difficulty urinating.  Musculoskeletal: Negative for arthralgias and myalgias.  Skin: Negative for rash.  Neurological: Negative for headaches.  Hematological: Bruises/bleeds easily.  Psychiatric/Behavioral: Negative for sleep disturbance.    Objective:  BP (!) 152/74   Pulse 85   Temp 98.2 F (36.8 C) (Oral)   Ht 5' 10"  (1.778 m)   Wt (!) 327 lb (148.3 kg)   BMI 46.92 kg/m   BP Readings from Last 3 Encounters:  05/28/18 (!) 152/74  04/19/18 138/68  04/13/18 (!) 146/68    Wt Readings from Last 3 Encounters:  05/28/18 (!) 327 lb (148.3 kg)  04/19/18 (!) 329 lb (149.2 kg)  04/13/18 (!) 315 lb (142.9 kg)     Physical Exam Vitals signs reviewed.  Constitutional:      Appearance: He is well-developed.  HENT:     Head: Normocephalic and atraumatic.     Right Ear: External ear normal.     Left Ear: External ear normal.     Mouth/Throat:     Pharynx: No oropharyngeal exudate or posterior oropharyngeal erythema.  Eyes:     Pupils: Pupils are equal, round, and reactive to light.  Neck:     Musculoskeletal: Normal range of motion and neck supple.  Cardiovascular:     Rate and Rhythm: Normal rate and regular rhythm.     Heart sounds: No murmur.  Pulmonary:     Effort: No respiratory distress.     Breath sounds: Normal breath sounds.  Skin:    Findings: Bruising (Several bruises in multiple stages of healing noted on abdomen and forearms) present.  Neurological:     Mental Status: He is alert and oriented to person, place,  and time.          Assessment & Plan:   Dennis Spencer was seen today for medication reaction.  Diagnoses and all orders for this visit:  Lacunar infarction [facility])  Essential hypertension -     CBC with Differential/Platelet -     CMP14+EGFR -     Urinalysis  Hyperglycemia -     Bayer DCA Hb A1c Waived  Other orders -     ticagrelor (BRILINTA) 60 MG TABS tablet; Take 1 tablet (60 mg total) by mouth 2 (two) times daily.   I have discontinued Rynell Ciotti. Kleppe's cephALEXin and clopidogrel. I am also having him start on ticagrelor. Additionally, I am having him maintain his vitamin C, Vitamin D3, multivitamin with minerals, b complex vitamins, Calcium Citrate-Vitamin D (CITRACAL + D PO), gabapentin, atorvastatin, amLODipine, aspirin, and pantoprazole.  Meds ordered this encounter  Medications  . ticagrelor (BRILINTA) 60 MG TABS tablet    Sig: Take 1 tablet (60 mg total) by mouth 2 (two) times daily.    Dispense:  60 tablet    Refill:  2     Follow-up: Return in about 3 months (around 08/27/2018), or if symptoms worsen or fail to improve.  Claretta Fraise, M.D.

## 2018-05-29 LAB — CMP14+EGFR
ALT: 18 IU/L (ref 0–44)
AST: 25 IU/L (ref 0–40)
Albumin/Globulin Ratio: 1.8 (ref 1.2–2.2)
Albumin: 4.1 g/dL (ref 3.5–4.8)
Alkaline Phosphatase: 88 IU/L (ref 39–117)
BUN/Creatinine Ratio: 11 (ref 10–24)
BUN: 15 mg/dL (ref 8–27)
Bilirubin Total: 0.8 mg/dL (ref 0.0–1.2)
CO2: 22 mmol/L (ref 20–29)
Calcium: 9.8 mg/dL (ref 8.6–10.2)
Chloride: 102 mmol/L (ref 96–106)
Creatinine, Ser: 1.39 mg/dL — ABNORMAL HIGH (ref 0.76–1.27)
GFR calc non Af Amer: 51 mL/min/{1.73_m2} — ABNORMAL LOW (ref >59)
GFR, EST AFRICAN AMERICAN: 59 mL/min/{1.73_m2} — AB (ref >59)
Globulin, Total: 2.3 g/dL (ref 1.5–4.5)
Glucose: 98 mg/dL (ref 65–99)
Potassium: 4.3 mmol/L (ref 3.5–5.2)
Sodium: 141 mmol/L (ref 134–144)
Total Protein: 6.4 g/dL (ref 6.0–8.5)

## 2018-05-29 LAB — CBC WITH DIFFERENTIAL/PLATELET
Basophils Absolute: 0.1 10*3/uL (ref 0.0–0.2)
Basos: 1 %
EOS (ABSOLUTE): 0.2 10*3/uL (ref 0.0–0.4)
Eos: 3 %
Hematocrit: 43.5 % (ref 37.5–51.0)
Hemoglobin: 14.5 g/dL (ref 13.0–17.7)
Immature Grans (Abs): 0 10*3/uL (ref 0.0–0.1)
Immature Granulocytes: 0 %
Lymphocytes Absolute: 1.2 10*3/uL (ref 0.7–3.1)
Lymphs: 26 %
MCH: 30.1 pg (ref 26.6–33.0)
MCHC: 33.3 g/dL (ref 31.5–35.7)
MCV: 90 fL (ref 79–97)
Monocytes Absolute: 0.6 10*3/uL (ref 0.1–0.9)
Monocytes: 12 %
NEUTROS ABS: 2.7 10*3/uL (ref 1.4–7.0)
Neutrophils: 58 %
Platelets: 230 10*3/uL (ref 150–450)
RBC: 4.82 x10E6/uL (ref 4.14–5.80)
RDW: 13.2 % (ref 11.6–15.4)
WBC: 4.8 10*3/uL (ref 3.4–10.8)

## 2018-06-18 ENCOUNTER — Telehealth: Payer: Self-pay | Admitting: Family Medicine

## 2018-06-18 ENCOUNTER — Other Ambulatory Visit: Payer: Self-pay | Admitting: Family Medicine

## 2018-06-18 MED ORDER — TRAMADOL HCL 50 MG PO TABS: 50.0000 mg | ORAL_TABLET | Freq: Four times a day (QID) | ORAL | 0 refills | Status: AC

## 2018-06-18 NOTE — Telephone Encounter (Signed)
I sent in the requested prescription 

## 2018-06-18 NOTE — Telephone Encounter (Signed)
Pt aware.

## 2018-07-09 DIAGNOSIS — N202 Calculus of kidney with calculus of ureter: Secondary | ICD-10-CM | POA: Diagnosis not present

## 2018-07-09 DIAGNOSIS — N200 Calculus of kidney: Secondary | ICD-10-CM | POA: Diagnosis not present

## 2018-07-15 ENCOUNTER — Other Ambulatory Visit: Payer: Self-pay | Admitting: Urology

## 2018-07-29 ENCOUNTER — Encounter: Payer: Self-pay | Admitting: Family Medicine

## 2018-07-29 ENCOUNTER — Ambulatory Visit (INDEPENDENT_AMBULATORY_CARE_PROVIDER_SITE_OTHER): Payer: Medicare HMO | Admitting: Family Medicine

## 2018-07-29 ENCOUNTER — Other Ambulatory Visit: Payer: Self-pay

## 2018-07-29 VITALS — BP 122/69 | HR 83 | Temp 98.9°F | Ht 70.0 in | Wt 327.2 lb

## 2018-07-29 DIAGNOSIS — N200 Calculus of kidney: Secondary | ICD-10-CM

## 2018-07-29 DIAGNOSIS — I10 Essential (primary) hypertension: Secondary | ICD-10-CM | POA: Diagnosis not present

## 2018-07-29 DIAGNOSIS — N183 Chronic kidney disease, stage 3 unspecified: Secondary | ICD-10-CM

## 2018-07-29 DIAGNOSIS — K219 Gastro-esophageal reflux disease without esophagitis: Secondary | ICD-10-CM | POA: Diagnosis not present

## 2018-07-29 DIAGNOSIS — R739 Hyperglycemia, unspecified: Secondary | ICD-10-CM | POA: Diagnosis not present

## 2018-07-29 NOTE — Progress Notes (Signed)
Subjective:  Patient ID: Dennis Spencer, male    DOB: [birth year]  Age: 73 y.o. MRN: [id number]  CC: Pre-op Exam   HPI Dennis Spencer presents for preop examination for upcoming renal procedure due to recurrent kidney stones.  He has had 3 stones recently and needs a laser procedure.  He is seen routinely by Dr. Rocky Crafts who has a procedure planned for April 3 and another for April  17th.  Patient has been on blood thinners due to lacunar infarction noted in early December by ophthalmologic exam.  He was sent to the emergency room and CT angiogram revealed 50% proximal right enchondral internal carotid stenosis 50% proximal left internal carotid stenosis.  However, chronic lacunar infarction was seen within the right caudate head.  Mild chronic microvascular ischemic changes were also noted.  Therefore he was placed on Brilinta for further stroke prevention. Depression screen [facility] 2/9 05/28/2018 04/19/2018 04/09/2018  Decreased Interest 1 0 0  Down, Depressed, Hopeless 1 0 0  PHQ - 2 Score 2 0 0  Altered sleeping 0 - -  Tired, decreased energy 2 - -  Change in appetite 0 - -  Feeling bad Dennis failure about yourself  0 - -  Trouble concentrating 0 - -  Moving slowly Dennis fidgety/restless 0 - -  Suicidal thoughts 0 - -  PHQ-9 Score 4 - -    History Dennis Spencer has a past medical history of Arthritis, CKD (chronic kidney disease), stage III ([place]) (11/10/2013), Gallstones, GERD (gastroesophageal reflux disease), History of kidney stones (08-04-13), Hypercholesteremia, Hypertension, Kidney stones, Osteomyelitis ([place]), Pneumonia (6-7 YRS AGO), 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   He has a past surgical history that includes Lithotripsy; Lithotripsy; Percutaneous nephrostolithotomy; Cystoscopy with retrograde pyelogram, ureteroscopy and stent placement (Bilateral, 06/18/2013); Holmium laser application (Bilateral, 06/18/2013); roux en  y; Tracheostomy; Tonsillectomy; Nephrolithotomy (Left, 08/08/2013); Cystoscopy with retrograde pyelogram, ureteroscopy and stent placement (Right, 08/08/2013); Nephrolithotomy (Left, 08/10/2013); Cystoscopy with retrograde pyelogram, ureteroscopy and stent placement (Right, 08/10/2013); Holmium laser application (Left, 10/13/4032); Cholecystectomy (N/A, 06/15/2015); Colonoscopy; Tonsillectomy; Gastric bypass; Cystoscopy with retrograde pyelogram, ureteroscopy and stent placement (Bilateral, 05/22/2017); Holmium laser application (Bilateral, 05/22/2017); Cystoscopy with retrograde pyelogram, ureteroscopy and stent placement (Bilateral, 06/05/2017); Cystoscopy/ureteroscopy/holmium laser/stent placement (Left, 06/26/2017); and Cystoscopy w/ ureteral stent removal (Right, 06/26/2017).   His family history includes Breast cancer in his maternal grandmother; Cancer in his mother; Colon cancer in his maternal grandfather; Diabetes in his father; Heart failure in his father; Hypertension in his father.He reports that he quit smoking about 50 years ago. His smoking use included cigarettes. He has a 8.00 pack-year smoking history. He has never used smokeless tobacco. He reports that he does not drink alcohol Dennis use drugs.    ROS Review of Systems  Constitutional: Negative.   HENT: Negative.   Eyes: Negative for visual disturbance.  Respiratory: Negative for cough and shortness of breath.   Cardiovascular: Negative for chest pain and leg swelling.  Gastrointestinal: Negative for abdominal pain, diarrhea, nausea and vomiting.  Genitourinary: Positive for flank pain. Negative for difficulty urinating.  Musculoskeletal: Negative for arthralgias and myalgias.  Skin: Negative for rash.  Neurological: Negative for headaches.  Psychiatric/Behavioral: Negative for sleep disturbance.    Objective:  BP 122/69   Pulse 83   Temp 98.9 F (37.2 C) (Oral)   Ht 5\' 10"  (1.778 m)   Wt (!) 327 lb 4 oz (148.4 kg)   BMI  46.96 kg/m    BP Readings from Last 3 Encounters:  07/29/18 122/69  05/28/18 (!) 152/74  04/19/18 138/68    Wt Readings from Last 3 Encounters:  07/29/18 (!) 327 lb 4 oz (148.4 kg)  05/28/18 (!) 327 lb (148.3 kg)  04/19/18 (!) 329 lb (149.2 kg)     Physical Exam Constitutional:      General: He is not in acute distress.    Appearance: He is well-developed.  HENT:     Head: Normocephalic and atraumatic.     Right Ear: External ear normal.     Left Ear: External ear normal.     Nose: Nose normal.  Eyes:     Conjunctiva/sclera: Conjunctivae normal.     Pupils: Pupils are equal, round, and reactive to light.  Neck:     Musculoskeletal: Normal range of motion and neck supple.  Cardiovascular:     Rate and Rhythm: Normal rate and regular rhythm.     Heart sounds: Normal heart sounds. No murmur.  Pulmonary:     Effort: Pulmonary effort is normal. No respiratory distress.     Breath sounds: Normal breath sounds. No wheezing Dennis rales.  Abdominal:     Palpations: Abdomen is soft.     Tenderness: There is no abdominal tenderness.  Musculoskeletal: Normal range of motion.  Skin:    General: Skin is warm and dry.  Neurological:     Mental Status: He is alert and oriented to person, place, and time.     Deep Tendon Reflexes: Reflexes are normal and symmetric.  Psychiatric:        Behavior: Behavior normal.        Thought Content: Thought content normal.        Judgment: Judgment normal.       Assessment & Plan:   Dennis Spencer was seen today for pre-op exam.  Diagnoses and all orders for this visit:  Essential hypertension  Hyperglycemia  Gastroesophageal reflux disease without esophagitis  CKD (chronic kidney disease), stage III ([place])  Nephrolithiasis       I have discontinued Dennis Spencer's aspirin. I am also having him maintain his vitamin C, Vitamin D3, multivitamin with minerals, b complex vitamins, Calcium Citrate-Vitamin D (CITRACAL + D PO), gabapentin,  atorvastatin, amLODipine, pantoprazole, and ticagrelor.  Allergies as of 07/29/2018      Reactions   Lisinopril Other (See Comments)   DIZZINESS      Medication List       Accurate as of July 29, 2018  1:52 PM. Always use your most recent med list.        amLODipine 5 MG tablet Commonly known as:  NORVASC Take 1 tablet (5 mg total) by mouth every morning.   atorvastatin 80 MG tablet Commonly known as:  LIPITOR TAKE 1 TABLET (80 MG TOTAL) BY MOUTH DAILY AT 6 PM.   b complex vitamins tablet Take 1 tablet by mouth daily.   CITRACAL + D PO Take 1 tablet by mouth 2 (two) times daily.   gabapentin 600 MG tablet Commonly known as:  NEURONTIN Take 0.5 tablets (300 mg total) by mouth daily.   multivitamin with minerals Tabs tablet Take 1 tablet by mouth daily.   pantoprazole 40 MG tablet Commonly known as:  PROTONIX Take 1 tablet (40 mg total) by mouth daily. For stomach   ticagrelor 60 MG Tabs tablet Commonly known as:  Brilinta Take 1 tablet (60 mg total) by mouth 2 (two) times daily.  vitamin C 500 MG tablet Commonly known as:  ASCORBIC ACID Take 500 mg by mouth 2 (two) times daily.   Vitamin D3 50 MCG (2000 UT) capsule Take 2,000 Units by mouth 2 (two) times daily.      The patient should do well being off of his Brilinta for several days.  He should stop it 1 week prior to the first procedure.  Resume after the procedure for about a week and then discontinue it again until after the second procedure.  Follow-up: Return in about 3 months (around 10/29/2018).  Claretta Fraise, M.D.

## 2018-08-02 ENCOUNTER — Encounter (HOSPITAL_COMMUNITY): Payer: Self-pay | Admitting: *Deleted

## 2018-08-04 ENCOUNTER — Encounter (HOSPITAL_COMMUNITY): Payer: Self-pay

## 2018-08-04 NOTE — Progress Notes (Signed)
SPOKE W/  ____  Pt via phone     SCREENING SYMPTOMS OF COVID 19:   COUGH---  Yes ,  Per pt normal for him he has a chronic tracheostomy  RUNNY NOSE---  NO  SORE THROAT--- NO  SHORTNESS OF BREATH--- NO  DIFFICULTY BREATHING--- NO  TEMP >100.4----- NO  TRAVELLED past 14 days OUT OF COUNTY--- NO STATE---- NO COUNTRY---- NO  HAVE YOU BEEN EXPOSED TO ANYONE WITH COVID 19?   NO

## 2018-08-04 NOTE — Patient Instructions (Addendum)
Dennis Spencer  08/04/2018       Your procedure is scheduled on:  08-13-2018   Report to [facility] Main  Entrance,  Report to admitting at  8:00 AM    Call this number if you have problems the morning of surgery [phone]    Remember: Do not eat food or drink liquids :After Midnight.   This includes no water, candy, gum, mints    BRUSH YOUR TEETH MORNING OF SURGERY AND RINSE YOUR MOUTH OUT    Take these medicines the morning of surgery with A SIP OF WATER:   Amlodipine (norvasc),  Pantoprazole (protonix)                                You may not have any metal on your body including piercings              Do not wear jewelry,  lotions, powders or perfumes, deodorant                          Men may shave face and neck.    Do not bring valuables to the hospital. [place].  Contacts, dentures or bridgework may not be worn into surgery.      Patients discharged the day of surgery will not be allowed to drive home. IF YOU ARE HAVING SURGERY AND GOING HOME THE SAME DAY, YOU MUST HAVE AN ADULT TO DRIVE YOU HOME AND BE WITH YOU FOR 24 HOURS. YOU MAY GO HOME BY TAXI OR UBER OR ORTHERWISE, BUT AN ADULT MUST ACCOMPANY YOU HOME AND STAY WITH YOU FOR 24 HOURS.    Name and phone number of your driver:     _____________________________________________________________________             [facility] - Preparing for Surgery Before surgery, you can play an important role.  Because skin is not sterile, your skin needs to be as free of germs as possible.  You can reduce the number of germs on your skin by washing with CHG (chlorahexidine gluconate) soap before surgery.  CHG is an antiseptic cleaner which kills germs and bonds with the skin to continue killing germs even after washing. Please DO NOT use if you have an allergy to CHG or antibacterial soaps.  If your skin becomes reddened/irritated stop using  the CHG and inform your nurse when you arrive at Short Stay. Do not shave (including legs and underarms) for at least 48 hours prior to the first CHG shower.  You may shave your face/neck. Please follow these instructions carefully:  1.  Shower with CHG Soap the night before surgery and the  morning of Surgery.  2.  If you choose to wash your hair, wash your hair first as usual with your  normal  shampoo.  3.  After you shampoo, rinse your hair and body thoroughly to remove the  shampoo.                            4.  Use CHG as you would any other liquid soap.  You can apply chg directly  to the skin and wash  Gently with a scrungie or clean washcloth.  5.  Apply the CHG Soap to your body ONLY FROM THE NECK DOWN.   Do not use on face/ open                           Wound or open sores. Avoid contact with eyes, ears mouth and genitals (private parts).                       Wash face,  Genitals (private parts) with your normal soap.             6.  Wash thoroughly, paying special attention to the area where your surgery  will be performed.  7.  Thoroughly rinse your body with warm water from the neck down.  8.  DO NOT shower/wash with your normal soap after using and rinsing off  the CHG Soap.             9.  Pat yourself dry with a clean towel.            10.  Wear clean pajamas.            11.  Place clean sheets on your bed the night of your first shower and do not  sleep with pets. Day of Surgery : Do not apply any lotions/deodorants the morning of surgery.  Please wear clean clothes to the hospital/surgery center.  FAILURE TO FOLLOW THESE INSTRUCTIONS MAY RESULT IN THE CANCELLATION OF YOUR SURGERY PATIENT SIGNATURE_________________________________  NURSE SIGNATURE__________________________________  ________________________________________________________________________

## 2018-08-05 ENCOUNTER — Encounter (HOSPITAL_COMMUNITY): Payer: Self-pay

## 2018-08-05 ENCOUNTER — Encounter (HOSPITAL_COMMUNITY)
Admission: RE | Admit: 2018-08-05 | Discharge: 2018-08-05 | Disposition: A | Discharge status: Home / Self Care | Payer: Medicare HMO | Source: Ambulatory Visit | Attending: Urology | Admitting: Urology

## 2018-08-05 ENCOUNTER — Other Ambulatory Visit: Payer: Self-pay

## 2018-08-05 DIAGNOSIS — Z01818 Encounter for other preprocedural examination: Secondary | ICD-10-CM | POA: Insufficient documentation

## 2018-08-05 HISTORY — DX: Personal history of transient ischemic attack (TIA), and cerebral infarction without residual deficits: Z86.73

## 2018-08-05 HISTORY — DX: Obstructive sleep apnea (adult) (pediatric): G47.33

## 2018-08-05 HISTORY — DX: Chronic cough: R05.3

## 2018-08-05 HISTORY — DX: Long term (current) use of anticoagulants: Z79.01

## 2018-08-05 HISTORY — DX: Personal history of other diseases of the musculoskeletal system and connective tissue: Z87.39

## 2018-08-05 HISTORY — DX: Presence of spectacles and contact lenses: Z97.3

## 2018-08-05 HISTORY — DX: Cough: R05

## 2018-08-05 HISTORY — DX: Hyperlipidemia, unspecified: E78.5

## 2018-08-05 HISTORY — DX: Urgency of urination: R39.15

## 2018-08-05 HISTORY — DX: Calculus of kidney: N20.0

## 2018-08-05 HISTORY — DX: Occlusion and stenosis of bilateral carotid arteries: I65.23

## 2018-08-05 LAB — CBC
HCT: 45 % (ref 39.0–52.0)
HEMOGLOBIN: 14.2 g/dL (ref 13.0–17.0)
MCH: 30 pg (ref 26.0–34.0)
MCHC: 31.6 g/dL (ref 30.0–36.0)
MCV: 95.1 fL (ref 80.0–100.0)
Platelets: 231 10*3/uL (ref 150–400)
RBC: 4.73 MIL/uL (ref 4.22–5.81)
RDW: 14.2 % (ref 11.5–15.5)
WBC: 4.8 10*3/uL (ref 4.0–10.5)
nRBC: 0 % (ref 0.0–0.2)

## 2018-08-05 LAB — BASIC METABOLIC PANEL
Anion gap: 9 (ref 5–15)
BUN: 16 mg/dL (ref 8–23)
CO2: 26 mmol/L (ref 22–32)
Calcium: 9.5 mg/dL (ref 8.9–10.3)
Chloride: 105 mmol/L (ref 98–111)
Creatinine, Ser: 1.35 mg/dL — ABNORMAL HIGH (ref 0.61–1.24)
GFR calc Af Amer: >60 mL/min (ref >60)
GFR calc non Af Amer: 52 mL/min — ABNORMAL LOW (ref >60)
Glucose, Bld: 108 mg/dL — ABNORMAL HIGH (ref 70–99)
Potassium: 4.2 mmol/L (ref 3.5–5.1)
Sodium: 140 mmol/L (ref 135–145)

## 2018-08-05 NOTE — Progress Notes (Addendum)
Head CT in epic 04-13-2018 Carotid ultrasound dated 04-23-2018 in epic.  ADDENDUM:  Final EKG dated 08-05-2018 in epic.

## 2018-08-05 NOTE — Progress Notes (Signed)
07-29-18 (Epic) Surgical clearance from Dr. Livia Snellen

## 2018-08-06 NOTE — Anesthesia Preprocedure Evaluation (Addendum)
Anesthesia Evaluation  Patient identified by MRN, date of birth, ID band Patient awake    Reviewed: Allergy & Precautions, NPO status , Patient's Chart, lab work & pertinent test results  Airway Mallampati: [place]  (+) Teeth Intact   Pulmonary former smoker,    breath sounds clear to auscultation       Cardiovascular hypertension,  Rhythm:Regular Rate:Normal     Neuro/Psych    GI/Hepatic   Endo/Other    Renal/GU      Musculoskeletal   Abdominal (+) + obese,   Peds  Hematology   Anesthesia Other Findings   Reproductive/Obstetrics                            Anesthesia Physical Anesthesia Plan  ASA: III  Anesthesia Plan: General   Post-op Pain Management:    Induction: Intravenous  PONV Risk Score and Plan: Ondansetron and Dexamethasone  Airway Management Planned: Tracheostomy  Additional Equipment:   Intra-op Plan:   Post-operative Plan:   Informed Consent: I have reviewed the patients History and Physical, chart, labs and discussed the procedure including the risks, benefits and alternatives for the proposed anesthesia with the patient or authorized representative who has indicated his/her understanding and acceptance.       Plan Discussed with: CRNA and Anesthesiologist  Anesthesia Plan Comments: (See PAT note 08/05/18, Konrad Felix, PA-C  Plan GA with 6.0 armored ETT )       Anesthesia Quick Evaluation

## 2018-08-06 NOTE — Progress Notes (Signed)
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 Alexis Frock.   Pt seen by PCP, Dr. Claretta Fraise, 07/29/18.  Per his note, "The patient should do well being off of his Brilinta for several days.  He should stop it 1 week prior to the first procedure.  Resume after the procedure for about a week and then discontinue it again until after the second procedure."  Evaluated by myself during PAT visit 08/05/18.  Pt asymptomatic, lungs clear to auscultation, heart RRR, no lower extremity edema noted.    General anesthesia 06/26/17 for cystoscopy/stent placement.  Notes reviewed, no anesthesia complications noted.   Pt can proceed with planned procedure barring acute status change.  VS: BP (!) 149/65   Pulse 70   Temp 36.7 C (Oral)   Resp 16   Ht 5\' 10"  (1.778 m)   Wt (!) 148 kg   SpO2 99%   BMI 46.83 kg/m   PROVIDERS: Claretta Fraise, MD is PCP    LABS: Labs reviewed: Acceptable for surgery. (all labs ordered are listed, but only abnormal results are displayed)  Labs Reviewed  BASIC METABOLIC PANEL - Abnormal; Notable for the following components:      Result Value   Glucose, Bld 108 (*)    Creatinine, Ser 1.35 (*)    GFR calc non Af Amer 52 (*)    All other  components within normal limits  CBC     IMAGES: Carotid US 04/23/18 IMPRESSION: Moderate calcified plaque at the level of both carotid bulbs and proximal internal carotid arteries. No significant carotid stenosis identified with estimated bilateral ICA stenoses of less than 50%.  EKG: 08/05/18 Rate 68 bpm Normal sinus rhythm  Low voltage QRS Borderline ECG No significant change since last tracing   CV: Echo 11/12/2013 Study Conclusions  - Left ventricle: The cavity size was normal. Wall thickness was normal. Systolic function was normal. The estimated ejection fraction was in the range of 60% to 65%. Doppler parameters are consistent with abnormal left ventricular relaxation (grade 1 diastolic dysfunction). - Aortic valve: Valve area (VTI): 2.64 cm^2. Valve area (Vmax): 2.61 cm^2. - Mitral valve: There was mild regurgitation. - Right ventricle: The cavity size was mildly to moderately dilated. Systolic function was low normal. RV TAPSE is 1.6 cm. - Technically difficult study.  Stress Test 11/11/13 Normal stress test  Conclusions NSR, PRWP No ST segment changes with Lexiscan  Past Medical History:  Diagnosis Date  . Anticoagulant long-term use    brilinta  . Arthritis   . Carotid stenosis, bilateral    per duplex 04-23-2018  bilateral ICA <50%  . Chronic cough    per pt this normal due to tracheostomy  . CKD (  chronic kidney disease), stage III ([place]) 11/10/2013  . GERD (gastroesophageal reflux disease)   . History of CVA (cerebrovascular accident) 04/13/2018   chronic lacunar infarct--- per pt no residuals  . History of kidney stones    multiple kidney stones  . History of osteomyelitis age 89   LLE  . Hyperlipidemia   . Hypertension   . OSA (obstructive sleep apnea) study in epic 09-25-2005  severe osa   1981 s/p tracheostomy, tongue reduction and septoplasty;  LEAVES TRACH OPEN AT NIGHT FOR SLEEP APNEA  . Presence of tracheostomy ([place]) since  1981   uncaps at night due to sleep apnea - caps during the day  . Renal calculi    bilateral  . Severe obesity (BMI >= 40) (HCC)   . Urgency of urination   . Wears glasses     Past Surgical History:  Procedure Laterality Date  . CHOLECYSTECTOMY N/A 06/15/2015   Procedure: LAPAROSCOPIC CHOLECYSTECTOMY;  Surgeon: Ralene Ok, MD;  Location: [facility];  Service: General;  Laterality: N/A;  . COLONOSCOPY  last one 03-28-2016  . CYSTOSCOPY W/ URETERAL STENT REMOVAL Right 06/26/2017   Procedure: CYSTOSCOPY WITH STENT REMOVAL;  Surgeon: Alexis Frock, MD;  Location: [facility];  Service: Urology;  Laterality: Right;  . CYSTOSCOPY WITH RETROGRADE PYELOGRAM, URETEROSCOPY AND STENT PLACEMENT Bilateral 06/18/2013   Procedure: CYSTOSCOPY WITH RETROGRADE PYELOGRAM, Left URETEROSCOPY with laser , AND bilateral STENT PLACEMENT;  Surgeon: Alexis Frock, MD;  Location: [facility];  Service: Urology;  Laterality: Bilateral;  . CYSTOSCOPY WITH RETROGRADE PYELOGRAM, URETEROSCOPY AND STENT PLACEMENT Right 08/08/2013   Procedure: CYSTOSCOPY WITH RIGHT RETROGRADE PYELOGRAM, URETEROSCOPY AND STENT PLACEMENT, stone extraction;  Surgeon: Alexis Frock, MD;  Location: [facility];  Service: Urology;  Laterality: Right;  . CYSTOSCOPY WITH RETROGRADE PYELOGRAM, URETEROSCOPY AND STENT PLACEMENT Right 08/10/2013   Procedure: 2ND STAGE CYSTOSCOPY WITH RETROGRADE PYELOGRAM, URETEROSCOPY AND STENT EXCHANGE;  Surgeon: Alexis Frock, MD;  Location: [facility];  Service: Urology;  Laterality: Right;  . CYSTOSCOPY WITH RETROGRADE PYELOGRAM, URETEROSCOPY AND STENT PLACEMENT Bilateral 05/22/2017   Procedure: CYSTOSCOPY WITH RETROGRADE PYELOGRAM, URETEROSCOPY AND STENT PLACEMENT;  Surgeon: Alexis Frock, MD;  Location: [facility];  Service: Urology;  Laterality: Bilateral;  . CYSTOSCOPY WITH RETROGRADE PYELOGRAM, URETEROSCOPY AND STENT PLACEMENT Bilateral 06/05/2017   Procedure: CYSTOSCOPY WITH RETROGRADE PYELOGRAM, URETEROSCOPY AND STENT PLACEMENT;   Surgeon: Alexis Frock, MD;  Location: [facility];  Service: Urology;  Laterality: Bilateral;  . CYSTOSCOPY/URETEROSCOPY/HOLMIUM LASER/STENT PLACEMENT Left 06/26/2017   Procedure: CYSTOSCOPY/URETEROSCOPY third stage/HOLMIUM LASER/STENT PLACEMENT retrograde pylegram;  Surgeon: Alexis Frock, MD;  Location: [facility];  Service: Urology;  Laterality: Left;  Marland Kitchen GASTRIC ROUX-EN-Y  2004    @[facility]   . HOLMIUM LASER APPLICATION Bilateral 06/16/3662   Procedure: HOLMIUM LASER APPLICATION;  Surgeon: Alexis Frock, MD;  Location: [facility];  Service: Urology;  Laterality: Bilateral;  . HOLMIUM LASER APPLICATION Left 4/0/3474   Procedure: HOLMIUM LASER APPLICATION;  Surgeon: Alexis Frock, MD;  Location: [facility];  Service: Urology;  Laterality: Left;  . HOLMIUM LASER APPLICATION Bilateral 2/59/5638   Procedure: HOLMIUM LASER APPLICATION;  Surgeon: Alexis Frock, MD;  Location: [facility];  Service: Urology;  Laterality: Bilateral;  . NEPHROLITHOTOMY Left 08/08/2013   Procedure: 1ST STAGE NEPHROLITHOTOMY PERCUTANEOUS WITH SURGEON ACCESS;  Surgeon: Alexis Frock, MD;  Location: [facility];  Service: Urology;  Laterality: Left;  . NEPHROLITHOTOMY Left 08/10/2013   Procedure: NEPHROLITHOTOMY PERCUTANEOUS SECOND LOOK/LEFT DIGITAL URETEROSCOPY/BASKETING OF STONE/EXCHANGE OF LEFT URETERAL STENT;  Surgeon: Alexis Frock, MD;  Location:  [facility];  Service: Urology;  Laterality: Left;  . ORCHIOPEXY  child   unlateral undescended testis  . PERCUTANEOUS NEPHROSTOLITHOTOMY Left 09-19-2009   dr Diona Fanti  @[facility]   . TONSILLECTOMY  child  . TRACHEOSTOMY  1987   s/p tongue reduction and nasal septum deviation repair for  Sleep apnea   . URETEROSCOPY WITH HOLMIUM LASER LITHOTRIPSY Left 04-14-2002   dr Diona Fanti  @[facility]     MEDICATIONS: . amLODipine (NORVASC) 5 MG tablet  . atorvastatin (LIPITOR) 80 MG tablet  . b complex vitamins tablet  . Calcium Citrate-Vitamin D (CITRACAL + D PO)  . Cholecalciferol (VITAMIN D3) 2000 UNITS capsule  .  gabapentin (NEURONTIN) 600 MG tablet  . Multiple Vitamin (MULTIVITAMIN WITH MINERALS) TABS tablet  . pantoprazole (PROTONIX) 40 MG tablet  . ticagrelor (BRILINTA) 60 MG TABS tablet  . vitamin C (ASCORBIC ACID) 500 MG tablet   No current facility-administered medications for this encounter.      Maia Plan [facility] Pre-Surgical Testing [phone] 08/06/18 11:15 AM

## 2018-08-09 ENCOUNTER — Encounter: Payer: Self-pay | Admitting: Family Medicine

## 2018-08-11 ENCOUNTER — Ambulatory Visit: Payer: Medicare HMO | Admitting: Family Medicine

## 2018-08-12 MED ORDER — GENTAMICIN SULFATE 40 MG/ML IJ SOLN
5.0000 mg/kg | INTRAVENOUS | Status: AC
  Administered 2018-08-13: 520 mg via INTRAVENOUS
  Filled 2018-08-12: qty 13

## 2018-08-13 ENCOUNTER — Ambulatory Visit (HOSPITAL_COMMUNITY): Payer: Medicare HMO | Admitting: Certified Registered Nurse Anesthetist

## 2018-08-13 ENCOUNTER — Ambulatory Visit (HOSPITAL_COMMUNITY)
Admission: RE | Admit: 2018-08-13 | Discharge: 2018-08-13 | Disposition: A | Discharge status: Home / Self Care | Payer: Medicare HMO | Attending: Urology | Admitting: Urology

## 2018-08-13 ENCOUNTER — Ambulatory Visit (HOSPITAL_COMMUNITY): Payer: Medicare HMO

## 2018-08-13 ENCOUNTER — Ambulatory Visit (HOSPITAL_COMMUNITY): Payer: Medicare HMO | Admitting: Physician Assistant

## 2018-08-13 ENCOUNTER — Encounter (HOSPITAL_COMMUNITY)
Admission: RE | Disposition: A | Discharge status: Home / Self Care | Payer: Self-pay | Source: Home / Self Care | Attending: Urology

## 2018-08-13 ENCOUNTER — Encounter (HOSPITAL_COMMUNITY): Payer: Self-pay | Admitting: *Deleted

## 2018-08-13 DIAGNOSIS — N200 Calculus of kidney: Secondary | ICD-10-CM | POA: Insufficient documentation

## 2018-08-13 DIAGNOSIS — E785 Hyperlipidemia, unspecified: Secondary | ICD-10-CM | POA: Insufficient documentation

## 2018-08-13 DIAGNOSIS — I129 Hypertensive chronic kidney disease with stage 1 through stage 4 chronic kidney disease, or unspecified chronic kidney disease: Secondary | ICD-10-CM | POA: Diagnosis not present

## 2018-08-13 DIAGNOSIS — E1122 Type 2 diabetes mellitus with diabetic chronic kidney disease: Secondary | ICD-10-CM | POA: Insufficient documentation

## 2018-08-13 DIAGNOSIS — Z930 Tracheostomy status: Secondary | ICD-10-CM | POA: Diagnosis not present

## 2018-08-13 DIAGNOSIS — Z87891 Personal history of nicotine dependence: Secondary | ICD-10-CM | POA: Diagnosis not present

## 2018-08-13 DIAGNOSIS — Z800 Family history of malignant neoplasm of digestive organs: Secondary | ICD-10-CM | POA: Diagnosis not present

## 2018-08-13 DIAGNOSIS — Z9049 Acquired absence of other specified parts of digestive tract: Secondary | ICD-10-CM | POA: Diagnosis not present

## 2018-08-13 DIAGNOSIS — Z8673 Personal history of transient ischemic attack (TIA), and cerebral infarction without residual deficits: Secondary | ICD-10-CM | POA: Diagnosis not present

## 2018-08-13 DIAGNOSIS — Z8249 Family history of ischemic heart disease and other diseases of the circulatory system: Secondary | ICD-10-CM | POA: Insufficient documentation

## 2018-08-13 DIAGNOSIS — Z8041 Family history of malignant neoplasm of ovary: Secondary | ICD-10-CM | POA: Insufficient documentation

## 2018-08-13 DIAGNOSIS — M1990 Unspecified osteoarthritis, unspecified site: Secondary | ICD-10-CM | POA: Insufficient documentation

## 2018-08-13 DIAGNOSIS — G4733 Obstructive sleep apnea (adult) (pediatric): Secondary | ICD-10-CM | POA: Insufficient documentation

## 2018-08-13 DIAGNOSIS — N210 Calculus in bladder: Secondary | ICD-10-CM | POA: Diagnosis not present

## 2018-08-13 DIAGNOSIS — Z7901 Long term (current) use of anticoagulants: Secondary | ICD-10-CM | POA: Insufficient documentation

## 2018-08-13 DIAGNOSIS — Z87442 Personal history of urinary calculi: Secondary | ICD-10-CM | POA: Diagnosis not present

## 2018-08-13 DIAGNOSIS — N183 Chronic kidney disease, stage 3 (moderate): Secondary | ICD-10-CM | POA: Diagnosis not present

## 2018-08-13 DIAGNOSIS — Z803 Family history of malignant neoplasm of breast: Secondary | ICD-10-CM | POA: Diagnosis not present

## 2018-08-13 DIAGNOSIS — Z9884 Bariatric surgery status: Secondary | ICD-10-CM | POA: Diagnosis not present

## 2018-08-13 DIAGNOSIS — Z833 Family history of diabetes mellitus: Secondary | ICD-10-CM | POA: Insufficient documentation

## 2018-08-13 DIAGNOSIS — Z888 Allergy status to other drugs, medicaments and biological substances status: Secondary | ICD-10-CM | POA: Insufficient documentation

## 2018-08-13 DIAGNOSIS — K219 Gastro-esophageal reflux disease without esophagitis: Secondary | ICD-10-CM | POA: Diagnosis not present

## 2018-08-13 HISTORY — PX: HOLMIUM LASER APPLICATION: SHX5852

## 2018-08-13 HISTORY — PX: CYSTOSCOPY WITH RETROGRADE PYELOGRAM, URETEROSCOPY AND STENT PLACEMENT: SHX5789

## 2018-08-13 SURGERY — CYSTOURETEROSCOPY, WITH RETROGRADE PYELOGRAM AND STENT INSERTION
Anesthesia: General | Laterality: Bilateral

## 2018-08-13 MED ORDER — OXYCODONE HCL 5 MG/5ML PO SOLN: 5.0000 mg | Freq: Once | ORAL | Status: DC | PRN

## 2018-08-13 MED ORDER — DEXAMETHASONE SODIUM PHOSPHATE 10 MG/ML IJ SOLN
INTRAMUSCULAR | Status: AC
  Filled 2018-08-13: qty 1

## 2018-08-13 MED ORDER — PROPOFOL 10 MG/ML IV BOLUS
INTRAVENOUS | Status: AC
  Filled 2018-08-13: qty 20

## 2018-08-13 MED ORDER — FENTANYL CITRATE (PF) 100 MCG/2ML IJ SOLN
INTRAMUSCULAR | Status: DC | PRN
  Administered 2018-08-13 (×2): 50 ug via INTRAVENOUS

## 2018-08-13 MED ORDER — PROPOFOL 10 MG/ML IV BOLUS
INTRAVENOUS | Status: DC | PRN
  Administered 2018-08-13: 40 mg via INTRAVENOUS
  Administered 2018-08-13: 160 mg via INTRAVENOUS

## 2018-08-13 MED ORDER — FENTANYL CITRATE (PF) 100 MCG/2ML IJ SOLN: 25.0000 ug | INTRAMUSCULAR | Status: DC | PRN

## 2018-08-13 MED ORDER — OXYCODONE-ACETAMINOPHEN 5-325 MG PO TABS: 1.0000 | ORAL_TABLET | Freq: Four times a day (QID) | ORAL | 0 refills | Status: DC | PRN

## 2018-08-13 MED ORDER — OXYCODONE HCL 5 MG PO TABS: 5.0000 mg | ORAL_TABLET | Freq: Once | ORAL | Status: DC | PRN

## 2018-08-13 MED ORDER — SODIUM CHLORIDE 0.9 % IR SOLN
Status: DC | PRN
  Administered 2018-08-13: 6000 mL via INTRAVESICAL

## 2018-08-13 MED ORDER — DEXAMETHASONE SODIUM PHOSPHATE 10 MG/ML IJ SOLN
INTRAMUSCULAR | Status: DC | PRN
  Administered 2018-08-13: 10 mg via INTRAVENOUS

## 2018-08-13 MED ORDER — FENTANYL CITRATE (PF) 100 MCG/2ML IJ SOLN
INTRAMUSCULAR | Status: AC
  Filled 2018-08-13: qty 2

## 2018-08-13 MED ORDER — ONDANSETRON HCL 4 MG/2ML IJ SOLN: 4.0000 mg | Freq: Once | INTRAMUSCULAR | Status: DC | PRN

## 2018-08-13 MED ORDER — ONDANSETRON HCL 4 MG/2ML IJ SOLN
INTRAMUSCULAR | Status: DC | PRN
  Administered 2018-08-13: 4 mg via INTRAVENOUS

## 2018-08-13 MED ORDER — IOHEXOL 300 MG/ML  SOLN
INTRAMUSCULAR | Status: DC | PRN
  Administered 2018-08-13: 30 mL

## 2018-08-13 MED ORDER — ONDANSETRON HCL 4 MG/2ML IJ SOLN
INTRAMUSCULAR | Status: AC
  Filled 2018-08-13: qty 2

## 2018-08-13 MED ORDER — LIDOCAINE 2% (20 MG/ML) 5 ML SYRINGE
INTRAMUSCULAR | Status: AC
  Filled 2018-08-13: qty 5

## 2018-08-13 MED ORDER — LIDOCAINE 2% (20 MG/ML) 5 ML SYRINGE
INTRAMUSCULAR | Status: DC | PRN
  Administered 2018-08-13: 100 mg via INTRAVENOUS

## 2018-08-13 MED ORDER — LACTATED RINGERS IV SOLN
INTRAVENOUS | Status: DC
  Administered 2018-08-13: 09:00:00 via INTRAVENOUS

## 2018-08-13 SURGICAL SUPPLY — 26 items
BAG URO CATCHER STRL LF (MISCELLANEOUS) ×4 IMPLANT
BASKET LASER NITINOL 1.9FR (BASKET) ×3 IMPLANT
BSKT STON RTRVL 120 1.9FR (BASKET) ×2
CATH INTERMIT  6FR 70CM (CATHETERS) ×4 IMPLANT
CLOTH BEACON ORANGE TIMEOUT ST (SAFETY) ×4 IMPLANT
COVER SURGICAL LIGHT HANDLE (MISCELLANEOUS) ×1 IMPLANT
COVER WAND RF STERILE (DRAPES) IMPLANT
EXTRACTOR STONE 1.7FRX115CM (UROLOGICAL SUPPLIES) IMPLANT
FIBER LASER FLEXIVA 1000 (UROLOGICAL SUPPLIES) IMPLANT
FIBER LASER FLEXIVA 365 (UROLOGICAL SUPPLIES) IMPLANT
FIBER LASER FLEXIVA 550 (UROLOGICAL SUPPLIES) IMPLANT
FIBER LASER TRAC TIP (UROLOGICAL SUPPLIES) ×3 IMPLANT
GLOVE BIOGEL M STRL SZ7.5 (GLOVE) ×4 IMPLANT
GOWN STRL REUS W/TWL LRG LVL3 (GOWN DISPOSABLE) ×8 IMPLANT
GUIDEWIRE ANG ZIPWIRE 038X150 (WIRE) ×7 IMPLANT
GUIDEWIRE STR DUAL SENSOR (WIRE) ×7 IMPLANT
KIT TURNOVER KIT A (KITS) ×4 IMPLANT
MANIFOLD NEPTUNE II (INSTRUMENTS) ×4 IMPLANT
PACK CYSTO (CUSTOM PROCEDURE TRAY) ×4 IMPLANT
SHEATH URETERAL 12FRX28CM (UROLOGICAL SUPPLIES) IMPLANT
SHEATH URETERAL 12FRX35CM (MISCELLANEOUS) ×3 IMPLANT
STENT POLARIS 5FRX24 (STENTS) ×6 IMPLANT
SYR CONTROL 10ML LL (SYRINGE) ×1 IMPLANT
TUBE FEEDING 8FR 16IN STR KANG (MISCELLANEOUS) ×4 IMPLANT
TUBING CONNECTING 10 (TUBING) ×3 IMPLANT
TUBING CONNECTING 10' (TUBING) ×1

## 2018-08-13 NOTE — Discharge Instructions (Signed)
1 - You may have urinary urgency (bladder spasms) and bloody urine on / off with stent in place. This is normal. ° °2 - Call MD or go to ER for fever >102, severe pain / nausea / vomiting not relieved by medications, or acute change in medical status ° °

## 2018-08-13 NOTE — Transfer of Care (Signed)
Immediate Anesthesia Transfer of Care Note  Patient: Dennis Spencer  Procedure(s) Performed: CYSTOSCOPY WITH RETROGRADE PYELOGRAM, URETEROSCOPY AND STENT PLACEMENT (Bilateral ) HOLMIUM LASER APPLICATION (Bilateral )  Patient Location: PACU  Anesthesia Type:General  Level of Consciousness: drowsy and patient cooperative  Airway & Oxygen Therapy: Patient Spontanous Breathing and Patient connected to tracheostomy mask oxygen  Post-op Assessment: Report given to RN and Post -op Vital signs reviewed and stable  Post vital signs: Reviewed and stable  Last Vitals:  Vitals Value Taken Time  BP 142/76 08/13/2018 12:45 PM  Temp    Pulse 64 08/13/2018 12:46 PM  Resp 11 08/13/2018 12:46 PM  SpO2 100 % 08/13/2018 12:46 PM  Vitals shown include unvalidated device data.  Last Pain:  Vitals:   08/13/18 0850  TempSrc: Oral         Complications: No apparent anesthesia complications

## 2018-08-13 NOTE — Op Note (Signed)
NAME: BLAYKE, CORDREY MEDICAL RECORD EX:[id number] ACCOUNT [id number] DATE OF BIRTH:01/15/1946 FACILITY: [facility] LOCATION: [facility] PHYSICIAN:Casanova Schurman, MD  OPERATIVE REPORT  DATE OF PROCEDURE:  08/13/2018  PREOPERATIVE DIAGNOSIS:  Left greater than right bilateral renal stones, large volume, recurrent.  PROCEDURE: 1.  Cystoscopy, bilateral retrograde pyelograms. 2.  Bilateral ureteroscopy, laser lithotripsy first-stage. 3.  Insertion of bilateral ureteral stents 5 x 24 Polaris, no tether.  ESTIMATED BLOOD LOSS:  Nil.  MEDICATIONS:  None.  SPECIMENS:  Bilateral renal stone fragments for analysis.  FINDINGS: 1.  Left greater than right large volume bilateral renal stones. 2.  Likely intermittent left ureteral obstruction from a ball-valving of left renal pelvis stone. 3.  Complete resolution of all right-sided stone fragments ablation of approximately 60% of the left side stone fragments. 4.  Successful placement of bilateral ureteral stents proximal in the renal pelvis, distal end in urinary bladder.  INDICATIONS:  Richardson Landry is a very pleasant, but quite comorbid 73 year old gentleman with history of enteric hyperoxaluria following gastric bypass.  He has had severe recurrent ureterolithiasis following this, the need for multiple recurrent surgeries  including even a percutaneous stone surgery several years ago.  He has been compliant with a protocol of imaging surveillance with preemptive ureteroscopy when stone volume gets to be a 2 cm or more in an effort to prevent progression to repeat  percutaneous stone surgery.  He was found on most recent surveillance imaging to have progression of his left-sided stones greater than 2 cm.  He also has some intermittent left-sided colicky symptoms consistent with intermittent obstruction.  He does  have some recurrent right-sided stone as well.  Options were discussed including recommended path of staged bilateral ureteroscopy with goal of  stone free to prevent progression to percutaneous stone surgery and wished to proceed.  Informed consent was  obtained and placed in medical record.  DESCRIPTION OF PROCEDURE:  Patient being Acy Orsak, procedure being bilateral first-stage ureteroscopic stimulation was confirmed.  Procedure timeout was performed.  Antibiotics administered.  General anesthesia introduced via his tracheostomy site.   The patient was placed into a low lithotomy position, sterile field was created prepping and draping his penis, perineum and proximal thighs  Cystourethroscopy was performed using 22-French rigid cystoscope with offset lens.  Inspection of anterior and  posterior urethra were unremarkable.  Inspection of bladder revealed no diverticula.  There was 2 bladder stones, small volume.  The left ureteral orifice was cannulated with a 6 [place] renal catheter and left retrograde pyelogram was obtained.  Left retrograde pyelogram demonstrated a single left ureter, single system left kidney.  There was a calcification turned to large filling defect within the renal pelvis consistent with known stone.  At this point a separate ZIPwire was advanced to the  lower pole and set aside as a safety wire.  Similarly, right retrograde pyelogram was obtained.  A right retrograde pyelogram demonstrated a single right ureter, single system right kidney.  No filling defects or narrowing noted.  A separate ZIPwire was advanced to the renal pelvis set aside as a safety wire.  An 8-French feeding tube was placed in  the urinary bladder pressure released.  Semi-rigid ureteroscopy was then performed of the distal 2/3 of the right ureter alongside a separate sensor working wire.  No mucosal abnormalities were found.  Similarly semirigid ureteroscopy was performed of  the distal 2/3 of the left ureter alongside a separate sensor working wire.  No mucosal abnormalities were found.  Next, a 38 cm  12 x ureteral access sheath was placed.   The sensor working wire on the right side to the level of the proximal ureter using  continuous fluoroscopic guidance and flexible digital ureteroscopy performed the proximal right ureter and systematic inspection of the right kidney, including all calices x3.  There were multifocal papillary tip calcifications.  These were mostly  amenable to simple basketing.  They were removed and set aside for composition analysis.  There was a single dominant stone in the upper pole approximately 7 mm that was not amenable to basketing alone.  As such, holmium laser energy applied 70 setting  of 0.2 joules and 20 Hz, fragmented into 3 smaller pieces that were then sequentially removed and the long axis and set aside.  The access sheath was removed under continuous vision, no mucosal abnormalities were found.  Next, the access sheath was  placed over the left sensor working wire to the level of approximately the left ureter and flexible digital ureteroscopy performed of the proximal left ureter and systematic inspection of the left kidney.  As expected, there was a multifocal left renal  stones with a dominant renal pelvis stone at least 2 cm.  This appeared to be much too large for simple basketing.  Holmium laser energy was applied to the stone using settings of 0.3 joules and 30 Hz.  Using a dusting technique, approximately 50% of the  stone volume was ablated.  Similarly, there were 2 other foci of free floating into renal stones upper pole and upper mid that were similarly ablated, approximately 50% volume.  The remaining of the stone volume was ablated using a fragmentation  technique and then a popcorn technique.  Following this, there was degeneration innumerable stone fragments and a significant amount of debris making visualization quite poor as anticipated.  This was clearly require second-stage procedure.  We achieved  the goals of the first-stage today.  Access sheath has been on continuous vision, no  mucosal abnormalities were found, and bilateral 5 x 24 Polaris-type stents were placed using fluoroscopic guidance.  Good proximal and distal deployment  were noted.  The procedure  terminated.    The patient tolerated the procedure well.  No immediate complications.  The patient was taken to [facility] in stable condition with plan for discharge home.  He will have a second-stage procedure in approximately 8 weeks given the need to  postpone cases in the setting of a limited resources with the viral crisis.  AN/NUANCE  D:08/13/2018 T:08/13/2018 JOB:[id number]

## 2018-08-13 NOTE — Brief Op Note (Signed)
08/13/2018  12:27 PM  PATIENT:  Dennis Spencer  73 y.o. male  PRE-OPERATIVE DIAGNOSIS:  BILATERAL RENAL CALCULI, BLADDER STONES  POST-OPERATIVE DIAGNOSIS:  BILATERAL RENAL CALCULI, BLADDER STONES  PROCEDURE:  Procedure(s) with comments: CYSTOSCOPY WITH RETROGRADE PYELOGRAM, URETEROSCOPY AND STENT PLACEMENT (Bilateral) - 90 MINS HOLMIUM LASER APPLICATION (Bilateral)  SURGEON:  Surgeon(s) and Role:    Alexis Frock, MD - Primary  PHYSICIAN ASSISTANT:   ASSISTANTS: none   ANESTHESIA:   general  EBL:  minimal   BLOOD ADMINISTERED:none  DRAINS: none   LOCAL MEDICATIONS USED:  NONE  SPECIMEN:  Source of Specimen:  bilateral renal stone fragments  DISPOSITION OF SPECIMEN:  discard  COUNTS:  YES  TOURNIQUET:  * No tourniquets in log *  DICTATION: .Other Dictation: Dictation Number  [id number]  PLAN OF CARE: Discharge to home after PACU  PATIENT DISPOSITION:  PACU - hemodynamically stable.   Delay start of Pharmacological VTE agent (>24hrs) due to surgical blood loss or risk of bleeding: yes

## 2018-08-13 NOTE — H&P (Signed)
Dennis Spencer is an 73 y.o. male.    Chief Complaint: Pre-OP 1st Stage BILATERAL Ureteroscopic Stone Manipulation  HPI:    1 - Recurrent Surgical Nephrolithiasis -   2011 - PCNL for large tones  2015 - Rt URS / Lt PCNL for huge volume left renal and bilateral ureteral stones, Total stone volume >6cm2  2019 - Bilateral 3 stage URS for Lt>Rt stone 4cm2 total    Recent Surveillance:  07/2018 - L>R renal / bladder stones with left hydro and hematuria.    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 Currently on CaCO4 daily, though only BID and rel low-dose.   Eval 2011 - 24 hr urine - severe hyperoxaluria >170   Eval 2015 - BMP,PTH,Urate - normal, Composition - CaOx; 24 hr Urines - high oxalate (60), low citrate (160) --> CaCitrate 2080 MEQ TID meals    3 - Renal Insufficiency - Cr 1.75 after episode of severe bilateral obstruction 2015 with peak of 6. Now established with nephrology. Most recnt Cr <1.5.    4 - Prostate Screening - No FHX prostate cancer. Received annual screening until 2018 / age 45 at which point PSA 0.5.    5 - Erectile Dysfunction - libido prserved. Unaided gets erection suitable for penetration, but unable to maintain to climax. NO nitrates. Libido preserved. Manages with Viagra prn.    PMH sig for morbid obesity, trach (since 1980s). Denies ischemic heart disease. His PCP is Claretta Fraise MD with [facility].   Today "Dennis Spencer" is seen to proceed with 1st stage bilateral ureteroscopic stone manipulation for L>R renal stones with acive bleeding and left obstruction. Most recent UCX negative.    Past Medical History:  Diagnosis Date  . Anticoagulant long-term use    brilinta  . Arthritis   . Carotid stenosis, bilateral    per duplex 04-23-2018  bilateral ICA <50%  . Chronic cough    per pt this normal due to tracheostomy  . CKD (chronic kidney disease), stage III ([place]) 11/10/2013  .  GERD (gastroesophageal reflux disease)   . History of CVA (cerebrovascular accident) 04/13/2018   chronic lacunar infarct--- per pt no residuals  . History of kidney stones    multiple kidney stones  . History of osteomyelitis age 80   LLE  . Hyperlipidemia   . Hypertension   . OSA (obstructive sleep apnea) study in epic 09-25-2005  severe osa   1981 s/p tracheostomy, tongue reduction and septoplasty;  LEAVES TRACH OPEN AT NIGHT FOR SLEEP APNEA  . 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 Renal calculi    bilateral  . Severe obesity (BMI >= 40) (H[facility])   . Urgency of urination   . Wears glasses     Past Surgical History:  Procedure Laterality Date  . CHOLECYSTECTOMY N/A 06/15/2015   Procedure: LAPAROSCOPIC CHOLECYSTECTOMY;  Surgeon: Ralene Ok, MD;  Location: [facility];  Service: General;  Laterality: N/A;  . COLONOSCOPY  last one 03-28-2016  . CYSTOSCOPY W/ URETERAL STENT REMOVAL Right 06/26/2017   Procedure: CYSTOSCOPY WITH STENT REMOVAL;  Surgeon: Alexis Frock, MD;  Location: [facility];  Service: Urology;  Laterality: Right;  . CYSTOSCOPY WITH RETROGRADE PYELOGRAM, URETEROSCOPY AND STENT PLACEMENT Bilateral 06/18/2013   Procedure: CYSTOSCOPY WITH RETROGRADE PYELOGRAM, Left URETEROSCOPY with laser , AND bilateral STENT PLACEMENT;  Surgeon: Alexis Frock, MD;  Location: [facility];  Service: Urology;  Laterality: Bilateral;  . CYSTOSCOPY WITH RETROGRADE PYELOGRAM, URETEROSCOPY AND STENT PLACEMENT Right 08/08/2013   Procedure: CYSTOSCOPY WITH RIGHT RETROGRADE PYELOGRAM, URETEROSCOPY AND STENT PLACEMENT, stone extraction;  Surgeon: Alexis Frock, MD;  Location: [facility];  Service: Urology;  Laterality: Right;  . CYSTOSCOPY WITH RETROGRADE PYELOGRAM, URETEROSCOPY AND STENT PLACEMENT Right 08/10/2013   Procedure: 2ND STAGE CYSTOSCOPY WITH RETROGRADE PYELOGRAM, URETEROSCOPY AND STENT EXCHANGE;  Surgeon: Alexis Frock, MD;  Location: [facility];  Service: Urology;  Laterality: Right;  . CYSTOSCOPY WITH RETROGRADE PYELOGRAM, URETEROSCOPY AND STENT PLACEMENT Bilateral 05/22/2017   Procedure: CYSTOSCOPY WITH RETROGRADE PYELOGRAM, URETEROSCOPY AND STENT PLACEMENT;  Surgeon: Alexis Frock, MD;  Location: [facility];  Service: Urology;  Laterality: Bilateral;  . CYSTOSCOPY WITH RETROGRADE PYELOGRAM, URETEROSCOPY AND STENT PLACEMENT Bilateral 06/05/2017   Procedure: CYSTOSCOPY WITH RETROGRADE PYELOGRAM, URETEROSCOPY AND STENT PLACEMENT;  Surgeon: Alexis Frock, MD;  Location: [facility];  Service: Urology;  Laterality: Bilateral;  . CYSTOSCOPY/URETEROSCOPY/HOLMIUM LASER/STENT PLACEMENT Left 06/26/2017   Procedure: CYSTOSCOPY/URETEROSCOPY third stage/HOLMIUM LASER/STENT PLACEMENT retrograde pylegram;  Surgeon: Alexis Frock, MD;  Location: [facility];  Service: Urology;  Laterality: Left;  Marland Kitchen GASTRIC ROUX-EN-Y  2004    @[facility]   . HOLMIUM LASER APPLICATION Bilateral 07/10/5398   Procedure: HOLMIUM LASER APPLICATION;  Surgeon: Alexis Frock, MD;  Location: [facility];  Service: Urology;  Laterality: Bilateral;  . HOLMIUM LASER APPLICATION Left 12/15/7617   Procedure: HOLMIUM LASER APPLICATION;  Surgeon: Alexis Frock, MD;  Location: [facility];  Service: Urology;  Laterality: Left;  . HOLMIUM LASER APPLICATION Bilateral 09/17/3265   Procedure: HOLMIUM LASER APPLICATION;  Surgeon: Alexis Frock, MD;  Location: [facility];  Service: Urology;  Laterality: Bilateral;  . NEPHROLITHOTOMY Left 08/08/2013   Procedure: 1ST STAGE NEPHROLITHOTOMY PERCUTANEOUS WITH SURGEON ACCESS;  Surgeon: Alexis Frock, MD;  Location: [facility];  Service: Urology;  Laterality: Left;  . NEPHROLITHOTOMY Left 08/10/2013   Procedure: NEPHROLITHOTOMY PERCUTANEOUS SECOND LOOK/LEFT DIGITAL URETEROSCOPY/BASKETING OF STONE/EXCHANGE OF LEFT URETERAL STENT;  Surgeon: Alexis Frock, MD;  Location: [facility];  Service: Urology;  Laterality: Left;  . ORCHIOPEXY  child   unlateral undescended testis  . PERCUTANEOUS  NEPHROSTOLITHOTOMY Left 09-19-2009   dr dahlstedt  @[facility]   . TONSILLECTOMY  child  . TRACHEOSTOMY  1987   s/p tongue reduction and nasal septum deviation repair for  Sleep apnea   . URETEROSCOPY WITH HOLMIUM LASER LITHOTRIPSY Left 04-14-2002   dr Diona Fanti  @[facility]     Family History  Problem Relation Age of Onset  . Cancer Mother        Ovarian, died age 53.  . Diabetes Father   . Hypertension Father   . Heart failure Father        Died of MI age 53.  . Breast cancer Maternal Grandmother   . Colon cancer Maternal Grandfather   . Esophageal cancer Neg Hx   . Stomach cancer Neg Hx   . Rectal cancer Neg Hx    Social History:  reports that he quit smoking about 50 years ago. His smoking use included cigarettes. He has a 8.00 pack-year smoking history. He has never used smokeless tobacco. He reports that he does not drink alcohol or use drugs.  Allergies:  Allergies  Allergen Reactions  . Lisinopril Other (See Comments)    DIZZINESS    No medications prior to admission.    No results found for this or any previous visit (from the past 48 hour(s)). No results found.  Review of  Systems  Constitutional: Negative.  Negative for chills and fever.  HENT: Negative.   Eyes: Negative.   Respiratory: Negative.   Cardiovascular: Negative.   Gastrointestinal: Negative.   Genitourinary: Positive for flank pain and hematuria.  Skin: Negative.   Endo/Heme/Allergies: Negative.   Psychiatric/Behavioral: Negative.     There were no vitals taken for this visit. Physical Exam  Constitutional: He appears well-developed.  HENT:  Head: Normocephalic.  Stable trach  Neck: Normal range of motion.  Cardiovascular: Normal rate.  Respiratory: Effort normal.  GI: Soft.  Stable morbid truncal obesity.   Genitourinary:    Genitourinary Comments: L>R CVAT.    Musculoskeletal: Normal range of motion.  Neurological: He is alert.     Assessment/Plan  Proceed as planned with bilateral 1st stage  ureteroscopic stone manipulation. Risks, benefits, alternatives, expected peri-op course discussed previously and reiteratd today. His 2nd stage proceure will likely be delayed to do viral crises but proceeding today will help verify no ongoing obstruction.   Alexis Frock, MD 08/13/2018, 7:47 AM

## 2018-08-13 NOTE — Anesthesia Procedure Notes (Signed)
Procedure Name: Intubation Date/Time: 08/13/2018 11:23 AM Performed by: Montel Clock, CRNA Pre-anesthesia Checklist: Patient being monitored, Timeout performed, Emergency Drugs available, Patient identified and Suction available Patient Re-evaluated:Patient Re-evaluated prior to induction Oxygen Delivery Method: Circle system utilized Preoxygenation: Pre-oxygenation with 100% oxygen Induction Type: IV induction Tube type: Reinforced Tube size: 6.0 mm Number of attempts: 1 Placement Confirmation: breath sounds checked- equal and bilateral and positive ETCO2 Tube secured with: Tape Dental Injury: Teeth and Oropharynx as per pre-operative assessment

## 2018-08-14 NOTE — Anesthesia Postprocedure Evaluation (Signed)
Anesthesia Post Note  Patient: Dennis Spencer  Procedure(s) Performed: CYSTOSCOPY WITH RETROGRADE PYELOGRAM, URETEROSCOPY AND STENT PLACEMENT (Bilateral ) HOLMIUM LASER APPLICATION (Bilateral )     Patient location during evaluation: PACU Anesthesia Type: General Level of consciousness: awake and alert Pain management: pain level controlled Vital Signs Assessment: post-procedure vital signs reviewed and stable Respiratory status: spontaneous breathing, nonlabored ventilation, respiratory function stable and patient connected to nasal cannula oxygen Cardiovascular status: blood pressure returned to baseline and stable Postop Assessment: no apparent nausea or vomiting Anesthetic complications: no    Last Vitals:  Vitals:   08/13/18 1318 08/13/18 1410  BP:  (!) 160/75  Pulse: 69 80  Resp: 13 18  Temp:  36.7 C  SpO2: 98% 100%    Last Pain:  Vitals:   08/13/18 1315  TempSrc:   PainSc: 0-No pain                 Deloyd Handy COKER

## 2018-08-16 ENCOUNTER — Encounter (HOSPITAL_COMMUNITY): Payer: Self-pay | Admitting: Urology

## 2018-08-24 ENCOUNTER — Other Ambulatory Visit: Payer: Self-pay | Admitting: Family Medicine

## 2018-08-24 DIAGNOSIS — E7800 Pure hypercholesterolemia, unspecified: Secondary | ICD-10-CM

## 2018-08-27 ENCOUNTER — Other Ambulatory Visit: Payer: Self-pay | Admitting: Family Medicine

## 2018-09-22 ENCOUNTER — Other Ambulatory Visit: Payer: Self-pay | Admitting: Family Medicine

## 2018-09-22 ENCOUNTER — Other Ambulatory Visit: Payer: Self-pay | Admitting: Urology

## 2018-09-22 DIAGNOSIS — I10 Essential (primary) hypertension: Secondary | ICD-10-CM

## 2018-09-23 ENCOUNTER — Ambulatory Visit: Payer: Medicare HMO

## 2018-09-27 ENCOUNTER — Ambulatory Visit (INDEPENDENT_AMBULATORY_CARE_PROVIDER_SITE_OTHER): Payer: Medicare HMO | Admitting: *Deleted

## 2018-09-27 ENCOUNTER — Other Ambulatory Visit: Payer: Self-pay

## 2018-09-27 DIAGNOSIS — Z0000 Encounter for general adult medical examination without abnormal findings: Secondary | ICD-10-CM | POA: Diagnosis not present

## 2018-09-27 NOTE — Patient Instructions (Signed)
Preventive Care 2 Years and Older, Male Preventive care refers to lifestyle choices and visits with your health care provider that can promote health and wellness. What does preventive care include?   A yearly physical exam. This is also called an annual well check.  Dental exams once or twice a year.  Routine eye exams. Ask your health care provider how often you should have your eyes checked.  Personal lifestyle choices, including: ? Daily care of your teeth and gums. ? Regular physical activity. ? Eating a healthy diet. ? Avoiding tobacco and drug use. ? Limiting alcohol use. ? Practicing safe sex. ? Taking low doses of aspirin every day. ? Taking vitamin and mineral supplements as recommended by your health care provider. What happens during an annual well check? The services and screenings done by your health care provider during your annual well check will depend on your age, overall health, lifestyle risk factors, and family history of disease. Counseling Your health care provider may ask you questions about your:  Alcohol use.  Tobacco use.  Drug use.  Emotional well-being.  Home and relationship well-being.  Sexual activity.  Eating habits.  History of falls.  Memory and ability to understand (cognition).  Work and work Statistician. Screening You may have the following tests or measurements:  Height, weight, and BMI.  Blood pressure.  Lipid and cholesterol levels. These may be checked every 5 years, or more frequently if you are over 9 years old.  Skin check.  Lung cancer screening. You may have this screening every year starting at age 57 if you have a 30-pack-year history of smoking and currently smoke or have quit within the past 15 years.  Colorectal cancer screening. All adults should have this screening starting at age 90 and continuing until age 69. You will have tests every 1-10 years, depending on your results and the type of screening  test. People at increased risk should start screening at an earlier age. Screening tests may include: ? Guaiac-based fecal occult blood testing. ? Fecal immunochemical test (FIT). ? Stool DNA test. ? Virtual colonoscopy. ? Sigmoidoscopy. During this test, a flexible tube with a tiny camera (sigmoidoscope) is used to examine your rectum and lower colon. The sigmoidoscope is inserted through your anus into your rectum and lower colon. ? Colonoscopy. During this test, a long, thin, flexible tube with a tiny camera (colonoscope) is used to examine your entire colon and rectum.  Prostate cancer screening. Recommendations will vary depending on your family history and other risks.  Hepatitis C blood test.  Hepatitis B blood test.  Sexually transmitted disease (STD) testing.  Diabetes screening. This is done by checking your blood sugar (glucose) after you have not eaten for a while (fasting). You may have this done every 1-3 years.  Abdominal aortic aneurysm (AAA) screening. You may need this if you are a current or former smoker.  Osteoporosis. You may be screened starting at age 30 if you are at high risk. Talk with your health care provider about your test results, treatment options, and if necessary, the need for more tests. Vaccines Your health care provider may recommend certain vaccines, such as:  Influenza vaccine. This is recommended every year.  Tetanus, diphtheria, and acellular pertussis (Tdap, Td) vaccine. You may need a Td booster every 10 years.  Varicella vaccine. You may need this if you have not been vaccinated.  Zoster vaccine. You may need this after age 42.  Measles, mumps, and rubella (MMR) vaccine.  You may need at least one dose of MMR if you were born in 1957 or later. You may also need a second dose.  Pneumococcal 13-valent conjugate (PCV13) vaccine. One dose is recommended after age 65.  Pneumococcal polysaccharide (PPSV23) vaccine. One dose is recommended  after age 65.  Meningococcal vaccine. You may need this if you have certain conditions.  Hepatitis A vaccine. You may need this if you have certain conditions or if you travel or work in places where you may be exposed to hepatitis A.  Hepatitis B vaccine. You may need this if you have certain conditions or if you travel or work in places where you may be exposed to hepatitis B.  Haemophilus influenzae type b (Hib) vaccine. You may need this if you have certain risk factors. Talk to your health care provider about which screenings and vaccines you need and how often you need them. This information is not intended to replace advice given to you by your health care provider. Make sure you discuss any questions you have with your health care provider. Document Released: 05/25/2015 Document Revised: 06/18/2017 Document Reviewed: 02/27/2015 Elsevier Interactive Patient Education  2019 [employer]  

## 2018-09-27 NOTE — Progress Notes (Addendum)
MEDICARE ANNUAL WELLNESS VISIT  09/27/2018  Telephone Visit Disclaimer This Medicare AWV was conducted by telephone due to national recommendations for restrictions regarding the COVID-19 Pandemic (e.g. social distancing).  I verified, using two identifiers, that I am speaking with Dennis Spencer or their authorized healthcare agent. I discussed the limitations, risks, security, and privacy concerns of performing an evaluation and management service by telephone and the potential availability of an in-person appointment in the future. The patient expressed understanding and agreed to proceed.   Subjective:  Dennis Spencer is a 73 y.o. male patient of Stacks, Cletus Gash, MD who had a Medicare Annual Wellness Visit today via telephone. Dennis Spencer is Retired and lives with their spouse and 2 grandchildren. he has 3 children. he reports that he is socially active and does interact with friends/family regularly. he is not physically active and enjoys photography.  Patient Care Team: Claretta Fraise, MD as PCP - General (Family Medicine)  Advanced Directives 09/27/2018 08/05/2018 04/13/2018 06/26/2017 06/23/2017 06/05/2017 05/29/2017  Does Patient Have a Medical Advance Directive? No No No No No No No  Would patient like information on creating a medical advance directive? No - Patient declined - No - Patient declined No - Patient declined No - Patient declined No - Patient declined -  Pre-existing out of facility DNR order (yellow form or pink MOST form) - - - - - - -    Hospital Utilization Over the Past 12 Months: # of hospitalizations or ER visits: 1 # of surgeries: 2  Review of Systems    Patient reports that his overall health is worse compared to last year.  Patient Reported Readings (BP, Pulse, CBG, Weight, etc) none  Review of Systems: No complaints  All other systems negative.  Pain Assessment Pain : No/denies pain     Current Medications & Allergies (verified) Allergies as of  09/27/2018      Reactions   Lisinopril Other (See Comments)   DIZZINESS      Medication List       Accurate as of Sep 27, 2018 10:49 AM. If you have any questions, ask your nurse or doctor.        STOP taking these medications   oxyCODONE-acetaminophen 5-325 MG tablet Commonly known as:  Percocet     TAKE these medications   amLODipine 5 MG tablet Commonly known as:  NORVASC TAKE 1 TABLET BY MOUTH EVERY DAY IN THE MORNING   atorvastatin 80 MG tablet Commonly known as:  LIPITOR TAKE 1 TABLET (80 MG TOTAL) BY MOUTH DAILY AT 6 PM.   b complex vitamins tablet Take 1 tablet by mouth daily.   Brilinta 60 MG Tabs tablet Generic drug:  ticagrelor TAKE 1 TABLET (60 MG TOTAL) BY MOUTH 2 (TWO) TIMES DAILY.   CITRACAL + D PO Take 1 tablet by mouth 2 (two) times daily.   gabapentin 600 MG tablet Commonly known as:  NEURONTIN Take 0.5 tablets (300 mg total) by mouth daily. What changed:  when to take this   multivitamin with minerals Tabs tablet Take 1 tablet by mouth daily.   pantoprazole 40 MG tablet Commonly known as:  PROTONIX Take 1 tablet (40 mg total) by mouth daily. For stomach   vitamin C 500 MG tablet Commonly known as:  ASCORBIC ACID Take 500 mg by mouth 2 (two) times daily.   Vitamin D3 50 MCG (2000 UT) capsule Take 2,000 Units by mouth 2 (two) times daily.  History (reviewed): Past Medical History:  Diagnosis Date   Anticoagulant long-term use    brilinta   Arthritis    Carotid stenosis, bilateral    per duplex 04-23-2018  bilateral ICA <50%   Chronic cough    per pt this normal due to tracheostomy   CKD (chronic kidney disease), stage III ([place]) 11/10/2013   GERD (gastroesophageal reflux disease)    History of CVA (cerebrovascular accident) 04/13/2018   chronic lacunar infarct--- per pt no residuals   History of kidney stones    multiple kidney stones   History of osteomyelitis age 62   LLE   Hyperlipidemia    Hypertension     OSA (obstructive sleep apnea) study in epic 09-25-2005  severe osa   1981 s/p tracheostomy, tongue reduction and septoplasty;  LEAVES TRACH OPEN AT NIGHT FOR SLEEP APNEA   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   uncaps at night due to sleep apnea - caps during the day   Renal calculi    bilateral   Severe obesity (BMI >= 40) ([place])    Urgency of urination    Wears glasses    Past Surgical History:  Procedure Laterality Date   CHOLECYSTECTOMY N/A 06/15/2015   Procedure: LAPAROSCOPIC CHOLECYSTECTOMY;  Surgeon: Ralene Ok, MD;  Location: [facility];  Service: General;  Laterality: N/A;   COLONOSCOPY  last one 03-28-2016   CYSTOSCOPY W/ URETERAL STENT REMOVAL Right 06/26/2017   Procedure: CYSTOSCOPY WITH STENT REMOVAL;  Surgeon: Alexis Frock, MD;  Location: [facility];  Service: Urology;  Laterality: Right;   CYSTOSCOPY WITH RETROGRADE PYELOGRAM, URETEROSCOPY AND STENT PLACEMENT Bilateral 06/18/2013   Procedure: CYSTOSCOPY WITH RETROGRADE PYELOGRAM, Left URETEROSCOPY with laser , AND bilateral STENT PLACEMENT;  Surgeon: Alexis Frock, MD;  Location: [facility];  Service: Urology;  Laterality: Bilateral;   CYSTOSCOPY WITH RETROGRADE PYELOGRAM, URETEROSCOPY AND STENT PLACEMENT Right 08/08/2013   Procedure: CYSTOSCOPY WITH RIGHT RETROGRADE PYELOGRAM, URETEROSCOPY AND STENT PLACEMENT, stone extraction;  Surgeon: Alexis Frock, MD;  Location: [facility];  Service: Urology;  Laterality: Right;   CYSTOSCOPY WITH RETROGRADE PYELOGRAM, URETEROSCOPY AND STENT PLACEMENT Right 08/10/2013   Procedure: 2ND STAGE CYSTOSCOPY WITH RETROGRADE PYELOGRAM, URETEROSCOPY AND STENT EXCHANGE;  Surgeon: Alexis Frock, MD;  Location: [facility];  Service: Urology;  Laterality: Right;   CYSTOSCOPY WITH RETROGRADE PYELOGRAM, URETEROSCOPY AND STENT PLACEMENT Bilateral 05/22/2017   Procedure: CYSTOSCOPY WITH RETROGRADE PYELOGRAM, URETEROSCOPY AND STENT PLACEMENT;  Surgeon: Alexis Frock, MD;  [facility];  Service:  Urology;  Laterality: Bilateral;   CYSTOSCOPY WITH RETROGRADE PYELOGRAM, URETEROSCOPY AND STENT PLACEMENT Bilateral 06/05/2017   Procedure: CYSTOSCOPY WITH RETROGRADE PYELOGRAM, URETEROSCOPY AND STENT PLACEMENT;  Surgeon: Alexis Frock, MD;  [facility];  Service: Urology;  Laterality: Bilateral;   CYSTOSCOPY WITH RETROGRADE PYELOGRAM, URETEROSCOPY AND STENT PLACEMENT Bilateral 08/13/2018   Procedure: CYSTOSCOPY WITH RETROGRADE PYELOGRAM, URETEROSCOPY AND STENT PLACEMENT;  Surgeon: Alexis Frock, MD;  Location: [facility];  Service: Urology;  Laterality: Bilateral;  90 MINS   CYSTOSCOPY/URETEROSCOPY/HOLMIUM LASER/STENT PLACEMENT Left 06/26/2017   Procedure: CYSTOSCOPY/URETEROSCOPY third stage/HOLMIUM LASER/STENT PLACEMENT retrograde pylegram;  Surgeon: Alexis Frock, MD;  Location: [facility];  Service: Urology;  Laterality: Left;   GASTRIC ROUX-EN-Y  2004    @[facility]    HOLMIUM LASER APPLICATION Bilateral 11/12/1636   Procedure: HOLMIUM LASER APPLICATION;  Surgeon: Alexis Frock, MD;  Location: [facility];  Service: Urology;  Laterality: Bilateral;   HOLMIUM LASER APPLICATION Left 08/14/3644   Procedure: HOLMIUM LASER APPLICATION;  Surgeon: Alexis Frock, MD;  Location:  [facility];  Service: Urology;  Laterality: Left;   HOLMIUM LASER APPLICATION Bilateral 0/56/9794   Procedure: HOLMIUM LASER APPLICATION;  Surgeon: Alexis Frock, MD;  Location: [facility];  Service: Urology;  Laterality: Bilateral;   HOLMIUM LASER APPLICATION Bilateral 8/0/1655   Procedure: HOLMIUM LASER APPLICATION;  Surgeon: Alexis Frock, MD;  Location: [facility];  Service: Urology;  Laterality: Bilateral;   NEPHROLITHOTOMY Left 08/08/2013   Procedure: 1ST STAGE NEPHROLITHOTOMY PERCUTANEOUS WITH SURGEON ACCESS;  Surgeon: Alexis Frock, MD;  Location: [facility];  Service: Urology;  Laterality: Left;   NEPHROLITHOTOMY Left 08/10/2013   Procedure: NEPHROLITHOTOMY PERCUTANEOUS SECOND LOOK/LEFT DIGITAL URETEROSCOPY/BASKETING OF STONE/EXCHANGE  OF LEFT URETERAL STENT;  Surgeon: Alexis Frock, MD;  Location: [facility];  Service: Urology;  Laterality: Left;   ORCHIOPEXY  child   unlateral undescended testis   PERCUTANEOUS NEPHROSTOLITHOTOMY Left 09-19-2009   dr Diona Fanti  @[facility]    TONSILLECTOMY  child   TRACHEOSTOMY  1987   s/p tongue reduction and nasal septum deviation repair for  Sleep apnea    URETEROSCOPY WITH HOLMIUM LASER LITHOTRIPSY Left 04-14-2002   dr Diona Fanti  @[facility]    Family History  Problem Relation Age of Onset   Cancer Mother        Ovarian, died age 16.   Diabetes Father    Hypertension Father    Heart failure Father        Died of MI age 47.   Breast cancer Maternal Grandmother    Colon cancer Maternal Grandfather    Esophageal cancer Neg Hx    Stomach cancer Neg Hx    Rectal cancer Neg Hx    Social History   Socioeconomic History   Marital status: Married    Spouse name: Helene Kelp   Number of children: 3   Years of education: Not on file   Highest education level: Master's degree (e.g., MA, MS, MEng, MEd, MSW, MBA)  Occupational History   Occupation: Retired, English as a second language teacher.    Employer: [employer]  Social Needs   Financial resource strain: Not hard at all   Food insecurity:    Worry: Never true    Inability: Never true   Transportation needs:    Medical: No    Non-medical: No  Tobacco Use   Smoking status: Former Smoker    Packs/day: 1.00    Years: 8.00    Pack years: 8.00    Types: Cigarettes    Last attempt to quit: 05/12/1968    Years since quitting: 50.4   Smokeless tobacco: Never Used  Substance and Sexual Activity   Alcohol use: No   Drug use: Never   Sexual activity: Yes    Birth control/protection: None  Lifestyle   Physical activity:    Days per week: 0 days    Minutes per session: 0 min   Stress: Not at all  Relationships   Social connections:    Talks on phone: Three times a week    Gets together: More than three times a week    Attends religious  service: More than 4 times per year    Active member of club or organization: Yes    Attends meetings of clubs or organizations: More than 4 times per year    Relationship status: Married  Other Topics Concern   Not on file  Social History Narrative   Married.      Activities of Daily Living In your present state of health, do you have any difficulty performing the following activities: 09/27/2018 08/05/2018  Hearing? N N  Vision? N N  Difficulty concentrating or making decisions? N N  Walking or climbing stairs? N N  Dressing or bathing? N N  Doing errands, shopping? N N  Preparing Food and eating ? N -  Using the Toilet? N -  In the past six months, have you accidently leaked urine? N -  Do you have problems with loss of bowel control? N -  Managing your Medications? N -  Managing your Finances? N -  Housekeeping or managing your Housekeeping? N -  Some recent data might be hidden    Patient Literacy How often do you need to have someone help you when you read instructions, pamphlets, or other written materials from your doctor or pharmacy?: 1 - Never What is the last grade level you completed in school?: Masters Degree  Exercise Current Exercise Habits: The patient does not participate in regular exercise at present, Exercise limited by: orthopedic condition(s)(pt has bad knees)  Diet Patient reports consuming 2 meals a day and 1 snack(s) a day Patient reports that his primary diet is: Regular Patient reports that she does have regular access to food.   Depression Screen PHQ 2/9 Scores 09/27/2018 05/28/2018 04/19/2018 04/09/2018 04/02/2018 02/10/2018 02/09/2018  PHQ - 2 Score 2 2 0 0 1 0 0  PHQ- 9 Score 3 4 - - 1 - -     Fall Risk Fall Risk  09/27/2018 04/19/2018 04/09/2018 04/02/2018 02/10/2018  Falls in the past year? 0 0 0 1 No  Number falls in past yr: - - 0 1 -  Injury with Fall? - - - 0 -  Risk for fall due to : - - - - -     Objective:  Dennis Spencer seemed  alert and oriented and he participated appropriately during our telephone visit.  Blood Pressure Weight BMI  BP Readings from Last 3 Encounters:  08/13/18 (!) 160/75  08/05/18 (!) 149/65  07/29/18 122/69   Wt Readings from Last 3 Encounters:  08/05/18 (!) 326 lb 6 oz (148 kg)  07/29/18 (!) 327 lb 4 oz (148.4 kg)  05/28/18 (!) 327 lb (148.3 kg)   BMI Readings from Last 1 Encounters:  08/05/18 46.83 kg/m    *Unable to obtain current vital signs, weight, and BMI due to telephone visit type  Hearing/Vision   Dennis Spencer did not seem to have difficulty with hearing/understanding during the telephone conversation  Reports that he has not had a formal eye exam by an eye care professional within the past year  Reports that he has not had a formal hearing evaluation within the past year *Unable to fully assess hearing and vision during telephone visit type  Cognitive Function: 6CIT Screen 09/27/2018  What Year? 0 points  What month? 0 points  What time? 0 points  Count back from 20 0 points  Months in reverse 0 points  Repeat phrase 0 points  Total Score 0    Normal Cognitive Function Screening: Yes (Normal:0-7, Significant for Dysfunction: >8)  Immunization & Health Maintenance Record Immunization History  Administered Date(s) Administered   Influenza,inj,Quad PF,6+ Mos 06/20/2013, 02/11/2017, 02/09/2018   Influenza-Unspecified 07/26/2015   Pneumococcal Conjugate-13 01/08/2016   Pneumococcal Polysaccharide-23 08/12/2017    Health Maintenance  Topic Date Due   TETANUS/TDAP  11/13/1965   Hepatitis C Screening  04/20/2019 (Originally 04-Mar-1946)   INFLUENZA VACCINE  12/11/2018   COLONOSCOPY  03/28/2021   PNA vac Low Risk Adult  Completed  Assessment  This is a routine wellness examination for Dennis Spencer.  Health Maintenance: Due or Overdue Health Maintenance Due  Topic Date Due   TETANUS/TDAP  11/13/1965    Dennis Spencer does not need a  referral for Community Assistance: Care Management:   no Social Work:    no Prescription Assistance:  no Nutrition/Diabetes Education:  no   Plan:  Personalized Goals Goals Addressed            This Visit's Progress    DIET - EAT MORE FRUITS AND VEGETABLES        Personalized Health Maintenance & Screening Recommendations  Td vaccine, Shingles Vaccines pt will consider getting these  Lung Cancer Screening Recommended: no (Low Dose CT Chest recommended if Age 45-80 years, 30 pack-year currently smoking OR have quit w/in past 15 years) Hepatitis C Screening recommended: no HIV Screening recommended: no  Advanced Directives: Written information was not prepared per patient's request.  Referrals & Orders No orders of the defined types were placed in this encounter.   Follow-up Plan  Follow-up with Claretta Fraise, MD as planned  Consider TDAP and Shingles Vaccines    I have personally reviewed and noted the following in the patients chart:    Medical and social history  Use of alcohol, tobacco or illicit drugs   Current medications and supplements  Functional ability and status  Nutritional status  Physical activity  Advanced directives  List of other physicians  Hospitalizations, surgeries, and ER visits in previous 12 months  Vitals  Screenings to include cognitive, depression, and falls  Referrals and appointments  In addition, I have reviewed and discussed with Dennis Spencer certain preventive protocols, quality metrics, and best practice recommendations. A written personalized care plan for preventive services as well as general preventive health recommendations is available and can be mailed to the patient at his request.      signature  09/27/2018   I have reviewed and agree with the above AWV documentation.  Claretta Fraise, M.D.

## 2018-10-14 ENCOUNTER — Other Ambulatory Visit: Payer: Self-pay | Admitting: Family Medicine

## 2018-10-18 ENCOUNTER — Encounter (HOSPITAL_COMMUNITY): Payer: Self-pay

## 2018-10-18 NOTE — Progress Notes (Signed)
EKG 08-05-18 EPIC

## 2018-10-18 NOTE — Patient Instructions (Addendum)
Dennis Spencer    Your procedure is scheduled on: 10-22-2018  Report to [facility] Main  Entrance  Report to admitting at 1215 PM   YOU NEED TO HAVE A COVID 19 TEST ON_______ @_______ , THIS TEST MUST BE DONE BEFORE SURGERY, COME TO [place].    Call this number if you have problems the morning of surgery [phone]    Remember: Do not eat food :After Midnight. CLEAR LIQUIDS FROM MIDNIGHT UNTIL 815 AM. NOTHING BY MOUTH AFTER 815 AM.    BRUSH YOUR TEETH MORNING OF SURGERY AND RINSE YOUR MOUTH OUT, NO CHEWING GUM CANDY OR MINTS.     CLEAR LIQUID DIET   Foods Allowed                                                                     Foods Excluded  Coffee and tea, regular and decaf                             liquids that you cannot  Plain Jell-O in any flavor                                             see through such as: Fruit ices (not with fruit pulp)                                     milk, soups, orange juice  Iced Popsicles                                    All solid food Carbonated beverages, regular and diet                                    Cranberry, grape and apple juices Sports drinks like Gatorade Lightly seasoned clear broth or consume(fat free) Sugar, honey syrup  Sample Menu Breakfast                                Lunch                                     Supper Cranberry juice                    Beef broth                            Chicken broth Jell-O  Grape juice                           Apple juice Coffee or tea                        Jell-O                                      Popsicle                                                Coffee or tea                        Coffee or tea  _____________________________________________________________________     Take these medicines the morning of surgery with A SIP OF WATER:  AMLODIPINE (NORVASC),  PANTAPRAZOLE (PROTONIX)                                You may not have any metal on your body including hair pins and              piercings  Do not wear jewelry, make-up, lotions, powders or perfumes, deodorant             Do not wear nail polish.  Do not shave  48 hours prior to surgery.              Men may shave face and neck.   Do not bring valuables to the hospital. [place].  Contacts, dentures or bridgework may not be worn into surgery.  Leave suitcase in the car. After surgery it may be brought to your room.     Patients discharged the day of surgery will not be allowed to drive home. IF YOU ARE HAVING SURGERY AND GOING HOME THE SAME DAY, YOU MUST HAVE AN ADULT TO DRIVE YOU HOME AND BE WITH YOU FOR 24 HOURS. YOU MAY GO HOME BY TAXI OR UBER OR ORTHERWISE, BUT AN ADULT MUST ACCOMPANY YOU HOME AND STAY WITH YOU FOR 24 HOURS.  Name and phone number of your driver:  Special Instructions: N/A              Please read over the following fact sheets you were given: _____________________________________________________________________             [facility] - Preparing for Surgery Before surgery, you can play an important role.  Because skin is not sterile, your skin needs to be as free of germs as possible.  You can reduce the number of germs on your skin by washing with CHG (chlorahexidine gluconate) soap before surgery.  CHG is an antiseptic cleaner which kills germs and bonds with the skin to continue killing germs even after washing. Please DO NOT use if you have an allergy to CHG or antibacterial soaps.  If your skin becomes reddened/irritated stop using the CHG and inform your nurse when you arrive at Short Stay. Do not shave (including legs and underarms) for at least 48 hours  prior to the first CHG shower.  You may shave your face/neck. Please follow these instructions carefully:  1.  Shower with CHG Soap the night before  surgery and the  morning of Surgery.  2.  If you choose to wash your hair, wash your hair first as usual with your  normal  shampoo.  3.  After you shampoo, rinse your hair and body thoroughly to remove the  shampoo.                           4.  Use CHG as you would any other liquid soap.  You can apply chg directly  to the skin and wash                       Gently with a scrungie or clean washcloth.  5.  Apply the CHG Soap to your body ONLY FROM THE NECK DOWN.   Do not use on face/ open                           Wound or open sores. Avoid contact with eyes, ears mouth and genitals (private parts).                       Wash face,  Genitals (private parts) with your normal soap.             6.  Wash thoroughly, paying special attention to the area where your surgery  will be performed.  7.  Thoroughly rinse your body with warm water from the neck down.  8.  DO NOT shower/wash with your normal soap after using and rinsing off  the CHG Soap.                9.  Pat yourself dry with a clean towel.            10.  Wear clean pajamas.            11.  Place clean sheets on your bed the night of your first shower and do not  sleep with pets. Day of Surgery : Do not apply any lotions/deodorants the morning of surgery.  Please wear clean clothes to the hospital/surgery center.  FAILURE TO FOLLOW THESE INSTRUCTIONS MAY RESULT IN THE CANCELLATION OF YOUR SURGERY PATIENT SIGNATURE_________________________________  NURSE SIGNATURE__________________________________  ________________________________________________________________________

## 2018-10-19 ENCOUNTER — Encounter (HOSPITAL_COMMUNITY): Payer: Self-pay

## 2018-10-19 ENCOUNTER — Other Ambulatory Visit (HOSPITAL_COMMUNITY)
Admission: RE | Admit: 2018-10-19 | Discharge: 2018-10-19 | Disposition: A | Discharge status: Home / Self Care | Payer: Medicare HMO | Source: Ambulatory Visit | Attending: Urology | Admitting: Urology

## 2018-10-19 ENCOUNTER — Other Ambulatory Visit: Payer: Self-pay

## 2018-10-19 ENCOUNTER — Encounter (HOSPITAL_COMMUNITY)
Admission: RE | Admit: 2018-10-19 | Discharge: 2018-10-19 | Disposition: A | Discharge status: Home / Self Care | Payer: Medicare HMO | Source: Ambulatory Visit | Attending: Urology | Admitting: Urology

## 2018-10-19 DIAGNOSIS — K219 Gastro-esophageal reflux disease without esophagitis: Secondary | ICD-10-CM | POA: Insufficient documentation

## 2018-10-19 DIAGNOSIS — Z79899 Other long term (current) drug therapy: Secondary | ICD-10-CM | POA: Insufficient documentation

## 2018-10-19 DIAGNOSIS — R82992 Hyperoxaluria: Secondary | ICD-10-CM | POA: Diagnosis not present

## 2018-10-19 DIAGNOSIS — I129 Hypertensive chronic kidney disease with stage 1 through stage 4 chronic kidney disease, or unspecified chronic kidney disease: Secondary | ICD-10-CM | POA: Insufficient documentation

## 2018-10-19 DIAGNOSIS — E785 Hyperlipidemia, unspecified: Secondary | ICD-10-CM | POA: Insufficient documentation

## 2018-10-19 DIAGNOSIS — N210 Calculus in bladder: Secondary | ICD-10-CM | POA: Insufficient documentation

## 2018-10-19 DIAGNOSIS — Z87442 Personal history of urinary calculi: Secondary | ICD-10-CM | POA: Diagnosis not present

## 2018-10-19 DIAGNOSIS — N200 Calculus of kidney: Secondary | ICD-10-CM | POA: Insufficient documentation

## 2018-10-19 DIAGNOSIS — N132 Hydronephrosis with renal and ureteral calculous obstruction: Secondary | ICD-10-CM | POA: Diagnosis not present

## 2018-10-19 DIAGNOSIS — Z930 Tracheostomy status: Secondary | ICD-10-CM | POA: Diagnosis not present

## 2018-10-19 DIAGNOSIS — T8389XA Other specified complication of genitourinary prosthetic devices, implants and grafts, initial encounter: Secondary | ICD-10-CM | POA: Diagnosis not present

## 2018-10-19 DIAGNOSIS — Z9884 Bariatric surgery status: Secondary | ICD-10-CM | POA: Diagnosis not present

## 2018-10-19 DIAGNOSIS — Z8673 Personal history of transient ischemic attack (TIA), and cerebral infarction without residual deficits: Secondary | ICD-10-CM | POA: Insufficient documentation

## 2018-10-19 DIAGNOSIS — Z01818 Encounter for other preprocedural examination: Secondary | ICD-10-CM | POA: Insufficient documentation

## 2018-10-19 DIAGNOSIS — G4733 Obstructive sleep apnea (adult) (pediatric): Secondary | ICD-10-CM | POA: Insufficient documentation

## 2018-10-19 DIAGNOSIS — N183 Chronic kidney disease, stage 3 (moderate): Secondary | ICD-10-CM | POA: Insufficient documentation

## 2018-10-19 DIAGNOSIS — Z7902 Long term (current) use of antithrombotics/antiplatelets: Secondary | ICD-10-CM | POA: Insufficient documentation

## 2018-10-19 DIAGNOSIS — Z6841 Body Mass Index (BMI) 40.0 and over, adult: Secondary | ICD-10-CM | POA: Diagnosis not present

## 2018-10-19 DIAGNOSIS — Z1159 Encounter for screening for other viral diseases: Secondary | ICD-10-CM | POA: Insufficient documentation

## 2018-10-19 DIAGNOSIS — Y831 Surgical operation with implant of artificial internal device as the cause of abnormal reaction of the patient, or of later complication, without mention of misadventure at the time of the procedure: Secondary | ICD-10-CM | POA: Diagnosis not present

## 2018-10-19 DIAGNOSIS — I6523 Occlusion and stenosis of bilateral carotid arteries: Secondary | ICD-10-CM | POA: Diagnosis not present

## 2018-10-19 DIAGNOSIS — Z87891 Personal history of nicotine dependence: Secondary | ICD-10-CM | POA: Insufficient documentation

## 2018-10-19 LAB — BASIC METABOLIC PANEL
Anion gap: 8 (ref 5–15)
BUN: 20 mg/dL (ref 8–23)
CO2: 24 mmol/L (ref 22–32)
Calcium: 8.6 mg/dL — ABNORMAL LOW (ref 8.9–10.3)
Chloride: 109 mmol/L (ref 98–111)
Creatinine, Ser: 1.58 mg/dL — ABNORMAL HIGH (ref 0.61–1.24)
GFR calc Af Amer: 50 mL/min — ABNORMAL LOW (ref >60)
GFR calc non Af Amer: 43 mL/min — ABNORMAL LOW (ref >60)
Glucose, Bld: 104 mg/dL — ABNORMAL HIGH (ref 70–99)
Potassium: 4.2 mmol/L (ref 3.5–5.1)
Sodium: 141 mmol/L (ref 135–145)

## 2018-10-19 LAB — CBC
HCT: 40.8 % (ref 39.0–52.0)
Hemoglobin: 13.1 g/dL (ref 13.0–17.0)
MCH: 30.3 pg (ref 26.0–34.0)
MCHC: 32.1 g/dL (ref 30.0–36.0)
MCV: 94.4 fL (ref 80.0–100.0)
Platelets: 255 10*3/uL (ref 150–400)
RBC: 4.32 MIL/uL (ref 4.22–5.81)
RDW: 13 % (ref 11.5–15.5)
WBC: 6.2 10*3/uL (ref 4.0–10.5)
nRBC: 0 % (ref 0.0–0.2)

## 2018-10-20 LAB — NOVEL CORONAVIRUS, NAA (HOSP ORDER, SEND-OUT TO REF LAB; TAT 18-24 HRS): SARS-CoV-2, NAA: NOT DETECTED

## 2018-10-20 NOTE — Progress Notes (Signed)
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 Alexis Frock.   Pt seen by PCP, Dr. Claretta Fraise, 07/29/18.  Per his note, "The patient should do well being off of his Brilinta for several days. He should stop it 1 week prior to the first procedure. Resume after the procedure for about a week and then discontinue it again until after the second procedure."  Evaluated by myself during PAT visit 10/22/2018.  Pt asymptomatic, lungs clear to auscultation, heart RRR, no lower extremity edema noted.    General anesthesia 08/13/2018 for cystoscopy/stent placement.  Notes reviewed, no anesthesia complications noted. Per last note, "Plan GA with 6.0 armored ETT"  Pt can proceed with planned procedure barring acute status change. VS: BP 138/61   Pulse 65   Temp 36.9 C (Oral)   Resp 18   Ht 5\' 10"  (1.778 m)   Wt (!) 147.4 kg   SpO2 99%   BMI 46.62 kg/m   PROVIDERS: Claretta Fraise, MD is PCP    LABS: Labs reviewed: Acceptable for surgery. (all labs ordered are listed, but only abnormal results are displayed)  Labs Reviewed  BASIC METABOLIC PANEL - Abnormal; Notable for the following components:      Result Value   Glucose, Bld 104 (*)    Creatinine, Ser 1.58 (*)    Calcium 8.6 (*)    GFR calc non Af Amer 43 (*)    GFR calc Af Amer 50 (*)    All other components within normal limits  CBC     IMAGES: US Carotid 04/23/18 IMPRESSION: Moderate calcified plaque at the level of both carotid bulbs and proximal internal carotid arteries. No significant carotid stenosis identified with estimated bilateral ICA stenoses of less than 50%.  EKG: 08/05/2018 Rate 68 bpm Normal sinus rhythm Low voltage QRS Borderline ECG No significant change since last tracing   CV: Echo 11/12/2013 Study Conclusions  - Left ventricle: The cavity size was normal. Wall thickness was normal. Systolic function was normal. The estimated ejection fraction was in the range of 60% to 65%. Doppler parameters are consistent with abnormal left ventricular relaxation (grade 1 diastolic dysfunction). - Aortic valve: Valve area (VTI): 2.64 cm^2. Valve area (Vmax): 2.61 cm^2. - Mitral valve: There was mild regurgitation. - Right ventricle: The cavity size was mildly to moderately dilated. Systolic function was low normal. RV TAPSE is 1.6 cm. - Technically difficult study.  Stress Test 11/12/13 IMPRESSION: Laterall wall scar with probable mild peri-infarct ischemia.  Ejection fraction calculated at 76%. Past Medical History:  Diagnosis Date  . Anticoagulant long-term use    brilinta  . Arthritis   . Carotid stenosis, bilateral    per duplex 04-23-2018  bilateral ICA <50%  .  Chronic cough    per pt this normal due to tracheostomy  . CKD (chronic kidney disease), stage III ([place]) 11/10/2013  . GERD (gastroesophageal reflux disease)   . History of CVA (cerebrovascular accident) 04/13/2018   chronic lacunar infarct--- per pt no residuals  . History of kidney stones    multiple kidney stones  . History of osteomyelitis age 46   LLE  . Hyperlipidemia   . Hypertension   . OSA (obstructive sleep apnea) study in epic 09-25-2005  severe osa   1981 s/p  tracheostomy, tongue reduction and septoplasty;  LEAVES TRACH OPEN AT NIGHT FOR SLEEP APNEA  . Pneumonia   . Presence of tracheostomy ([place]) since 1981   uncaps at night due to sleep apnea - caps during the day  . Renal calculi    bilateral  . Severe obesity (BMI >= 40) (HCC)   . Urgency of urination   . Wears glasses     Past Surgical History:  Procedure Laterality Date  . CHOLECYSTECTOMY N/A 06/15/2015   Procedure: LAPAROSCOPIC CHOLECYSTECTOMY;  Surgeon: Ralene Ok, MD;  [facility];  Service: General;  Laterality: N/A;  . COLONOSCOPY  last one 03-28-2016  . CYSTOSCOPY W/ URETERAL STENT REMOVAL Right 06/26/2017   Procedure: CYSTOSCOPY WITH STENT REMOVAL;  Surgeon: Alexis Frock, MD;  [facility];  Service: Urology;  Laterality: Right;  . CYSTOSCOPY WITH RETROGRADE PYELOGRAM, URETEROSCOPY AND STENT PLACEMENT Bilateral 06/18/2013   Procedure: CYSTOSCOPY WITH RETROGRADE PYELOGRAM, Left URETEROSCOPY with laser , AND bilateral STENT PLACEMENT;  Surgeon: Alexis Frock, MD;  [facility];  Service: Urology;  Laterality: Bilateral;  . CYSTOSCOPY WITH RETROGRADE PYELOGRAM, URETEROSCOPY AND STENT PLACEMENT Right 08/08/2013   Procedure: CYSTOSCOPY WITH RIGHT RETROGRADE PYELOGRAM, URETEROSCOPY AND STENT PLACEMENT, stone extraction;  Surgeon: Alexis Frock, MD;  [facility];  Service: Urology;  Laterality: Right;  . CYSTOSCOPY WITH RETROGRADE PYELOGRAM, URETEROSCOPY AND STENT PLACEMENT Right 08/10/2013   Procedure: 2ND STAGE CYSTOSCOPY WITH RETROGRADE PYELOGRAM, URETEROSCOPY AND STENT EXCHANGE;  Surgeon: Alexis Frock, MD;  [facility];  Service: Urology;  Laterality: Right;  . CYSTOSCOPY WITH RETROGRADE PYELOGRAM, URETEROSCOPY AND STENT PLACEMENT Bilateral 05/22/2017   Procedure: CYSTOSCOPY WITH RETROGRADE PYELOGRAM, URETEROSCOPY AND STENT PLACEMENT;  Surgeon: Alexis Frock, MD;  [facility];  Service: Urology;  Laterality: Bilateral;  . CYSTOSCOPY WITH RETROGRADE  PYELOGRAM, URETEROSCOPY AND STENT PLACEMENT Bilateral 06/05/2017   Procedure: CYSTOSCOPY WITH RETROGRADE PYELOGRAM, URETEROSCOPY AND STENT PLACEMENT;  Surgeon: Alexis Frock, MD;  [facility];  Service: Urology;  Laterality: Bilateral;  . CYSTOSCOPY WITH RETROGRADE PYELOGRAM, URETEROSCOPY AND STENT PLACEMENT Bilateral 08/13/2018   Procedure: CYSTOSCOPY WITH RETROGRADE PYELOGRAM, URETEROSCOPY AND STENT PLACEMENT;  Surgeon: Alexis Frock, MD;  [facility];  Service: Urology;  Laterality: Bilateral;  90 MINS  . CYSTOSCOPY/URETEROSCOPY/HOLMIUM LASER/STENT PLACEMENT Left 06/26/2017   Procedure: CYSTOSCOPY/URETEROSCOPY third stage/HOLMIUM LASER/STENT PLACEMENT retrograde pylegram;  Surgeon: Alexis Frock, MD;  [facility];  Service: Urology;  Laterality: Left;  Marland Kitchen GASTRIC ROUX-EN-Y  2004    @[facility]   . HOLMIUM LASER APPLICATION Bilateral 0/06/5850   Procedure: HOLMIUM LASER APPLICATION;  Surgeon: Alexis Frock, MD;  [facility];  Service: Urology;  Laterality: Bilateral;  . HOLMIUM LASER APPLICATION Left 11/15/8240   Procedure: HOLMIUM LASER APPLICATION;  Surgeon: Alexis Frock, MD;  [facility];  Service: Urology;  Laterality: Left;  . HOLMIUM LASER APPLICATION Bilateral 3/53/6144   Procedure: HOLMIUM LASER APPLICATION;  Surgeon: Alexis Frock, MD;  [facility];  Service: Urology;  Laterality: Bilateral;  . HOLMIUM LASER APPLICATION Bilateral 12/15/9290   Procedure: HOLMIUM LASER APPLICATION;  Surgeon: Alexis Frock, MD;  [facility];  Service: Urology;  Laterality: Bilateral;  . NEPHROLITHOTOMY Left 08/08/2013   Procedure: 1ST STAGE NEPHROLITHOTOMY PERCUTANEOUS WITH SURGEON ACCESS;  Surgeon: Alexis Frock, MD;  [facility];  Service: Urology;  Laterality: Left;  . NEPHROLITHOTOMY Left 08/10/2013   Procedure: NEPHROLITHOTOMY PERCUTANEOUS SECOND LOOK/LEFT DIGITAL URETEROSCOPY/BASKETING OF STONE/EXCHANGE OF LEFT URETERAL STENT;  Surgeon: Alexis Frock, MD;  Location:  [facility];  Service: Urology;  Laterality: Left;  . ORCHIOPEXY  child   unlateral undescended testis  . PERCUTANEOUS NEPHROSTOLITHOTOMY Left 09-19-2009   dr dahlstedt  @[facility]   . TONSILLECTOMY  child  . TRACHEOSTOMY  1987   s/p tongue reduction and nasal septum deviation repair for  Sleep apnea   . URETEROSCOPY WITH HOLMIUM LASER LITHOTRIPSY Left 04-14-2002   dr Diona Fanti  @[facility]   . WISDOM TOOTH EXTRACTION      MEDICATIONS: . amLODipine (NORVASC) 5 MG tablet  . atorvastatin (LIPITOR) 80 MG tablet  . b complex vitamins tablet  . BRILINTA 60 MG TABS tablet  . Calcium Citrate-Vitamin D (CITRACAL + D PO)  . Cholecalciferol (VITAMIN D3) 2000 UNITS capsule  . gabapentin (NEURONTIN) 600 MG tablet  . Multiple Vitamin (MULTIVITAMIN WITH MINERALS) TABS tablet  . pantoprazole (PROTONIX) 40 MG tablet  . vitamin C (ASCORBIC ACID) 500 MG tablet   No current facility-administered medications for this encounter.    Maia Plan [facility] Pre-Surgical Testing [phone] 10/20/18 10:32 AM

## 2018-10-20 NOTE — Anesthesia Preprocedure Evaluation (Addendum)
Anesthesia Evaluation  Patient identified by MRN, date of birth, ID band Patient awake    Reviewed: Allergy & Precautions, H&P , NPO status , Patient's Chart, lab work & pertinent test results, reviewed documented beta blocker date and time   Airway Mallampati: Trach  TM Distance: >3 FB Neck ROM: full    Dental no notable dental hx.    Pulmonary sleep apnea , former smoker,    Pulmonary exam normal breath sounds clear to auscultation       Cardiovascular Exercise Tolerance: Good hypertension, Pt. on medications negative cardio ROS   Rhythm:regular Rate:Normal     Neuro/Psych per duplex 04-23-2018  bilateral ICA <50% CVA negative psych ROS   GI/Hepatic Neg liver ROS, GERD  ,  Endo/Other  negative endocrine ROS  Renal/GU CRFRenal disease  negative genitourinary   Musculoskeletal  (+) Arthritis , Osteoarthritis,    Abdominal   Peds  Hematology negative hematology ROS (+)   Anesthesia Other Findings   Reproductive/Obstetrics negative OB ROS                            Anesthesia Physical Anesthesia Plan  ASA: III  Anesthesia Plan: General   Post-op Pain Management:    Induction:   PONV Risk Score and Plan: 2  Airway Management Planned: Tracheostomy  Additional Equipment:   Intra-op Plan:   Post-operative Plan:   Informed Consent: I have reviewed the patients History and Physical, chart, labs and discussed the procedure including the risks, benefits and alternatives for the proposed anesthesia with the patient or authorized representative who has indicated his/her understanding and acceptance.     Dental Advisory Given  Plan Discussed with: CRNA, Anesthesiologist and Surgeon  Anesthesia Plan Comments: (See PAT note 10/19/2018, Konrad Felix, PA-C  Plan GA with 6.0 armored ETT)      Anesthesia Quick Evaluation

## 2018-10-21 MED ORDER — GENTAMICIN SULFATE 40 MG/ML IJ SOLN
5.0000 mg/kg | INTRAVENOUS | Status: AC
  Administered 2018-10-22: 514 mg via INTRAVENOUS
  Filled 2018-10-21: qty 12.75

## 2018-10-22 ENCOUNTER — Ambulatory Visit (HOSPITAL_COMMUNITY): Payer: Medicare HMO

## 2018-10-22 ENCOUNTER — Encounter (HOSPITAL_COMMUNITY): Payer: Self-pay | Admitting: General Practice

## 2018-10-22 ENCOUNTER — Ambulatory Visit (HOSPITAL_COMMUNITY)
Admission: RE | Admit: 2018-10-22 | Discharge: 2018-10-22 | Disposition: A | Discharge status: Home / Self Care | Payer: Medicare HMO | Attending: Urology | Admitting: Urology

## 2018-10-22 ENCOUNTER — Ambulatory Visit (HOSPITAL_COMMUNITY): Payer: Medicare HMO | Admitting: Anesthesiology

## 2018-10-22 ENCOUNTER — Encounter (HOSPITAL_COMMUNITY)
Admission: RE | Disposition: A | Discharge status: Home / Self Care | Payer: Self-pay | Source: Home / Self Care | Attending: Urology

## 2018-10-22 ENCOUNTER — Ambulatory Visit (HOSPITAL_COMMUNITY): Payer: Medicare HMO | Admitting: Physician Assistant

## 2018-10-22 DIAGNOSIS — Z87442 Personal history of urinary calculi: Secondary | ICD-10-CM | POA: Insufficient documentation

## 2018-10-22 DIAGNOSIS — R82992 Hyperoxaluria: Secondary | ICD-10-CM | POA: Insufficient documentation

## 2018-10-22 DIAGNOSIS — G4733 Obstructive sleep apnea (adult) (pediatric): Secondary | ICD-10-CM | POA: Insufficient documentation

## 2018-10-22 DIAGNOSIS — T8389XA Other specified complication of genitourinary prosthetic devices, implants and grafts, initial encounter: Secondary | ICD-10-CM | POA: Insufficient documentation

## 2018-10-22 DIAGNOSIS — N210 Calculus in bladder: Secondary | ICD-10-CM | POA: Diagnosis not present

## 2018-10-22 DIAGNOSIS — N183 Chronic kidney disease, stage 3 (moderate): Secondary | ICD-10-CM | POA: Diagnosis not present

## 2018-10-22 DIAGNOSIS — I129 Hypertensive chronic kidney disease with stage 1 through stage 4 chronic kidney disease, or unspecified chronic kidney disease: Secondary | ICD-10-CM | POA: Diagnosis not present

## 2018-10-22 DIAGNOSIS — I6523 Occlusion and stenosis of bilateral carotid arteries: Secondary | ICD-10-CM | POA: Insufficient documentation

## 2018-10-22 DIAGNOSIS — N132 Hydronephrosis with renal and ureteral calculous obstruction: Secondary | ICD-10-CM | POA: Insufficient documentation

## 2018-10-22 DIAGNOSIS — Z79899 Other long term (current) drug therapy: Secondary | ICD-10-CM | POA: Insufficient documentation

## 2018-10-22 DIAGNOSIS — Z7902 Long term (current) use of antithrombotics/antiplatelets: Secondary | ICD-10-CM | POA: Insufficient documentation

## 2018-10-22 DIAGNOSIS — Z9884 Bariatric surgery status: Secondary | ICD-10-CM | POA: Insufficient documentation

## 2018-10-22 DIAGNOSIS — E785 Hyperlipidemia, unspecified: Secondary | ICD-10-CM | POA: Insufficient documentation

## 2018-10-22 DIAGNOSIS — Z6841 Body Mass Index (BMI) 40.0 and over, adult: Secondary | ICD-10-CM | POA: Diagnosis not present

## 2018-10-22 DIAGNOSIS — Z8673 Personal history of transient ischemic attack (TIA), and cerebral infarction without residual deficits: Secondary | ICD-10-CM | POA: Insufficient documentation

## 2018-10-22 DIAGNOSIS — K219 Gastro-esophageal reflux disease without esophagitis: Secondary | ICD-10-CM | POA: Insufficient documentation

## 2018-10-22 DIAGNOSIS — Z930 Tracheostomy status: Secondary | ICD-10-CM | POA: Insufficient documentation

## 2018-10-22 DIAGNOSIS — Y831 Surgical operation with implant of artificial internal device as the cause of abnormal reaction of the patient, or of later complication, without mention of misadventure at the time of the procedure: Secondary | ICD-10-CM | POA: Insufficient documentation

## 2018-10-22 DIAGNOSIS — Z1159 Encounter for screening for other viral diseases: Secondary | ICD-10-CM | POA: Diagnosis not present

## 2018-10-22 DIAGNOSIS — N200 Calculus of kidney: Secondary | ICD-10-CM | POA: Diagnosis not present

## 2018-10-22 HISTORY — PX: CYSTOSCOPY WITH RETROGRADE PYELOGRAM, URETEROSCOPY AND STENT PLACEMENT: SHX5789

## 2018-10-22 HISTORY — PX: HOLMIUM LASER APPLICATION: SHX5852

## 2018-10-22 HISTORY — PX: CYSTOSCOPY W/ URETERAL STENT REMOVAL: SHX1430

## 2018-10-22 SURGERY — CYSTOURETEROSCOPY, WITH RETROGRADE PYELOGRAM AND STENT INSERTION
Anesthesia: General | Laterality: Bilateral

## 2018-10-22 MED ORDER — ACETAMINOPHEN 160 MG/5ML PO SOLN: 325.0000 mg | ORAL | Status: DC | PRN

## 2018-10-22 MED ORDER — MEPERIDINE HCL 50 MG/ML IJ SOLN: 6.2500 mg | INTRAMUSCULAR | Status: DC | PRN

## 2018-10-22 MED ORDER — PROPOFOL 10 MG/ML IV BOLUS
INTRAVENOUS | Status: DC | PRN
  Administered 2018-10-22: 200 mg via INTRAVENOUS

## 2018-10-22 MED ORDER — LACTATED RINGERS IV SOLN
INTRAVENOUS | Status: DC
  Administered 2018-10-22: 13:00:00 via INTRAVENOUS

## 2018-10-22 MED ORDER — FENTANYL CITRATE (PF) 100 MCG/2ML IJ SOLN: 25.0000 ug | INTRAMUSCULAR | Status: DC | PRN

## 2018-10-22 MED ORDER — ONDANSETRON HCL 4 MG/2ML IJ SOLN
INTRAMUSCULAR | Status: DC | PRN
  Administered 2018-10-22: 4 mg via INTRAVENOUS

## 2018-10-22 MED ORDER — DEXAMETHASONE SODIUM PHOSPHATE 10 MG/ML IJ SOLN
INTRAMUSCULAR | Status: DC | PRN
  Administered 2018-10-22: 5 mg via INTRAVENOUS

## 2018-10-22 MED ORDER — FENTANYL CITRATE (PF) 100 MCG/2ML IJ SOLN
INTRAMUSCULAR | Status: DC | PRN
  Administered 2018-10-22: 50 ug via INTRAVENOUS

## 2018-10-22 MED ORDER — SENNOSIDES-DOCUSATE SODIUM 8.6-50 MG PO TABS: 1.0000 | ORAL_TABLET | Freq: Two times a day (BID) | ORAL | 0 refills | Status: DC

## 2018-10-22 MED ORDER — SODIUM CHLORIDE 0.9 % IR SOLN
Status: DC | PRN
  Administered 2018-10-22: 9000 mL

## 2018-10-22 MED ORDER — 0.9 % SODIUM CHLORIDE (POUR BTL) OPTIME
TOPICAL | Status: DC | PRN
  Administered 2018-10-22: 2000 mL

## 2018-10-22 MED ORDER — OXYCODONE-ACETAMINOPHEN 5-325 MG PO TABS: 1.0000 | ORAL_TABLET | Freq: Four times a day (QID) | ORAL | 0 refills | Status: DC | PRN

## 2018-10-22 MED ORDER — OXYCODONE HCL 5 MG/5ML PO SOLN: 5.0000 mg | Freq: Once | ORAL | Status: DC | PRN

## 2018-10-22 MED ORDER — OXYCODONE HCL 5 MG PO TABS: 5.0000 mg | ORAL_TABLET | Freq: Once | ORAL | Status: DC | PRN

## 2018-10-22 MED ORDER — ONDANSETRON HCL 4 MG/2ML IJ SOLN: 4.0000 mg | Freq: Once | INTRAMUSCULAR | Status: DC | PRN

## 2018-10-22 MED ORDER — FENTANYL CITRATE (PF) 100 MCG/2ML IJ SOLN
INTRAMUSCULAR | Status: AC
  Filled 2018-10-22: qty 2

## 2018-10-22 MED ORDER — CEPHALEXIN 500 MG PO CAPS: 500.0000 mg | ORAL_CAPSULE | Freq: Two times a day (BID) | ORAL | 0 refills | Status: DC

## 2018-10-22 MED ORDER — ACETAMINOPHEN 325 MG PO TABS: 325.0000 mg | ORAL_TABLET | ORAL | Status: DC | PRN

## 2018-10-22 SURGICAL SUPPLY — 26 items
BAG URO CATCHER STRL LF (MISCELLANEOUS) ×3 IMPLANT
BASKET LASER NITINOL 1.9FR (BASKET) IMPLANT
BASKET STONE NCOMPASS (UROLOGICAL SUPPLIES) ×2 IMPLANT
BSKT STON RTRVL 120 1.9FR (BASKET)
CATH INTERMIT  6FR 70CM (CATHETERS) ×3 IMPLANT
CATH UROLOGY TORQUE 40 (MISCELLANEOUS) ×2 IMPLANT
CLOTH BEACON ORANGE TIMEOUT ST (SAFETY) ×3 IMPLANT
COVER SURGICAL LIGHT HANDLE (MISCELLANEOUS) ×3 IMPLANT
COVER WAND RF STERILE (DRAPES) IMPLANT
EXTRACTOR STONE 1.7FRX115CM (UROLOGICAL SUPPLIES) IMPLANT
FIBER LASER FLEXIVA 1000 (UROLOGICAL SUPPLIES) IMPLANT
FIBER LASER FLEXIVA 365 (UROLOGICAL SUPPLIES) IMPLANT
FIBER LASER FLEXIVA 550 (UROLOGICAL SUPPLIES) IMPLANT
FIBER LASER TRAC TIP (UROLOGICAL SUPPLIES) ×2 IMPLANT
GLOVE BIOGEL M STRL SZ7.5 (GLOVE) ×3 IMPLANT
GOWN STRL REUS W/TWL LRG LVL3 (GOWN DISPOSABLE) ×3 IMPLANT
GUIDEWIRE ANG ZIPWIRE 038X150 (WIRE) ×5 IMPLANT
GUIDEWIRE STR DUAL SENSOR (WIRE) ×5 IMPLANT
KIT TURNOVER KIT A (KITS) ×2 IMPLANT
MANIFOLD NEPTUNE II (INSTRUMENTS) ×3 IMPLANT
PACK CYSTO (CUSTOM PROCEDURE TRAY) ×3 IMPLANT
SHEATH URET ACCESS 12FR/35CM (UROLOGICAL SUPPLIES) ×2 IMPLANT
SHEATH URETERAL 12FRX28CM (UROLOGICAL SUPPLIES) IMPLANT
TUBE FEEDING 8FR 16IN STR KANG (MISCELLANEOUS) ×3 IMPLANT
TUBING CONNECTING 10 (TUBING) ×2 IMPLANT
TUBING CONNECTING 10' (TUBING) ×1

## 2018-10-22 NOTE — Anesthesia Procedure Notes (Signed)
Procedure Name: Intubation Date/Time: 10/22/2018 3:15 PM Performed by: [place] Keeanna Villafranca C, CRNA Pre-anesthesia Checklist: Patient identified, Emergency Drugs available, Suction available, Patient being monitored and Timeout performed Patient Re-evaluated:Patient Re-evaluated prior to induction Oxygen Delivery Method: Circle system utilized Preoxygenation: Pre-oxygenation with 100% oxygen Induction Type: IV induction and Combination inhalational/ intravenous induction Tube type: Reinforced Tube size: 6.0 mm Number of attempts: 1 Secured at: 15 cm Tube secured with: Tape Comments: Trach removed and 6.0 ETT placed via stoma,  Positive ETCO2 and = BBS

## 2018-10-22 NOTE — Brief Op Note (Signed)
10/22/2018  5:11 PM  PATIENT:  Dennis Spencer  73 y.o. male  PRE-OPERATIVE DIAGNOSIS:  BILATERAL RENAL CALCULI, BLADDER STONES  POST-OPERATIVE DIAGNOSIS:  BILATERAL RENAL CALCULI, BLADDER STONES  PROCEDURE:  Procedure(s): CYSTOSCOPY WITH RETROGRADE PYELOGRAM, URETEROSCOPY AND STENT PLACEMENT (Bilateral) HOLMIUM LASER APPLICATION (Bilateral) CYSTOSCOPY WITH STENT REMOVAL (Bilateral)  SURGEON:  Surgeon(s) and Role:    Alexis Frock, MD - Primary  PHYSICIAN ASSISTANT:   ASSISTANTS: none   ANESTHESIA:   general  EBL:  minimal   BLOOD ADMINISTERED:none  DRAINS: none   LOCAL MEDICATIONS USED:  NONE  SPECIMEN:  Source of Specimen:  bilateral renal / ureteral stone fragments  DISPOSITION OF SPECIMEN:  discard  COUNTS:  YES  TOURNIQUET:  * No tourniquets in log *  DICTATION: .Other Dictation: Dictation Number  [id number]  PLAN OF CARE: Discharge to home after PACU  PATIENT DISPOSITION:  PACU - hemodynamically stable.   Delay start of Pharmacological VTE agent (>24hrs) due to surgical blood loss or risk of bleeding: yes

## 2018-10-22 NOTE — Transfer of Care (Signed)
Immediate Anesthesia Transfer of Care Note  Patient: Dennis Spencer  Procedure(s) Performed: CYSTOSCOPY WITH RETROGRADE PYELOGRAM, URETEROSCOPY  STONE BASKETRY AND STENT PLACEMENT (Bilateral ) HOLMIUM LASER APPLICATION (Bilateral ) CYSTOSCOPY WITH STENT REMOVAL (Bilateral )  Patient Location: PACU  Anesthesia Type:General  Level of Consciousness: awake, alert , oriented and patient cooperative  Airway & Oxygen Therapy: Patient Spontanous Breathing and Patient connected to face mask oxygen  Post-op Assessment: Report given to RN and Post -op Vital signs reviewed and stable  Post vital signs: Reviewed and stable  Last Vitals:  Vitals Value Taken Time  BP 148/74 10/22/18 1728  Temp    Pulse 58 10/22/18 1730  Resp 11 10/22/18 1730  SpO2 100 % 10/22/18 1730  Vitals shown include unvalidated device data.  Last Pain:  Vitals:   10/22/18 1218  TempSrc:   PainSc: 0-No pain         Complications: No apparent anesthesia complications

## 2018-10-22 NOTE — Discharge Instructions (Signed)
1 - You may have urinary urgency (bladder spasms) and bloody urine on / off with stent in place. This is normal.  2 - Remove tethered stents on Monday mornging at home by pulling on the strings, then blue-white plastic tubing, and discarding. Office is open Monday if any problems arise.   3 -  Call MD or go to ER for fever >102, severe pain / nausea / vomiting not relieved by medications, or acute change in medical status

## 2018-10-22 NOTE — Anesthesia Postprocedure Evaluation (Signed)
Anesthesia Post Note  Patient: RECO SHONK  Procedure(s) Performed: CYSTOSCOPY WITH RETROGRADE PYELOGRAM, URETEROSCOPY  STONE BASKETRY AND STENT PLACEMENT (Bilateral ) HOLMIUM LASER APPLICATION (Bilateral ) CYSTOSCOPY WITH STENT REMOVAL (Bilateral )     Patient location during evaluation: PACU Anesthesia Type: General Level of consciousness: awake and alert Pain management: pain level controlled Vital Signs Assessment: post-procedure vital signs reviewed and stable Respiratory status: spontaneous breathing, nonlabored ventilation, respiratory function stable and patient connected to nasal cannula oxygen Cardiovascular status: blood pressure returned to baseline and stable Postop Assessment: no apparent nausea or vomiting Anesthetic complications: no    Last Vitals:  Vitals:   10/22/18 1800 10/22/18 1902  BP: (!) 164/80 (!) 141/71  Pulse: 63   Resp: 13   Temp: 36.5 C 36.6 C  SpO2: 98%     Last Pain:  Vitals:   10/22/18 1800  TempSrc:   PainSc: 0-No pain                 Sincerity Cedar

## 2018-10-22 NOTE — H&P (Signed)
Dennis Spencer is an 72 y.o. male.    Chief Complaint: Pre-OP BILATERAL 2nd Stage Ureteroscopic Stone Manipulation  HPI:     1 - Recurrent Surgical Nephrolithiasis -   2011 - PCNL for large tones  2015 - Rt URS / Lt PCNL for huge volume left renal and bilateral ureteral stones, Total stone volume >6cm2  2019 - Bilateral 3 stage URS for Lt>Rt stone 4cm2 total  07/2018 - L>R renal / bladder stones with left hydro and hematuria ==> 1st stage bilaterla URS 08/2018.   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 Currently on CaCO4 daily, though only BID and rel low-dose.   Eval 2011 - 24 hr urine - severe hyperoxaluria >170   Eval 2015 - BMP,PTH,Urate - normal, Composition - CaOx; 24 hr Urines - high oxalate (60), low citrate (160) --> CaCitrate 2080 MEQ TID meals    3 - Renal Insufficiency - Cr 1.75 after episode of severe bilateral obstruction 2015 with peak of 6. Now established with nephrology. Most recnt Cr 1.6.    PMH sig for morbid obesity, trach (since 1980s). Denies ischemic heart disease. His PCP is Claretta Fraise MD with [facility].   Today "Dennis Spencer" is seen to proceed with 2nd stage bilateral ureteroscopic stone manipulation for L>R renal stones. No interval fevers.   Past Medical History:  Diagnosis Date  . Anticoagulant long-term use    brilinta  . Arthritis   . Carotid stenosis, bilateral    per duplex 04-23-2018  bilateral ICA <50%  . Chronic cough    per pt this normal due to tracheostomy  . CKD (chronic kidney disease), stage III ([place]) 11/10/2013  . GERD (gastroesophageal reflux disease)   . History of CVA (cerebrovascular accident) 04/13/2018   chronic lacunar infarct--- per pt no residuals  . History of kidney stones    multiple kidney stones  . History of osteomyelitis age 24   LLE  . Hyperlipidemia   . Hypertension   . OSA (obstructive sleep apnea) study in epic 09-25-2005  severe  osa   1981 s/p tracheostomy, tongue reduction and septoplasty;  LEAVES TRACH OPEN AT NIGHT FOR SLEEP APNEA  . Pneumonia   . Presence of tracheostomy ([place]) since 1981   uncaps at night due to sleep apnea - caps during the day  . Renal calculi    bilateral  . Severe obesity (BMI >= 40) (HCC)   . Urgency of urination   . Wears glasses     Past Surgical History:  Procedure Laterality Date  . CHOLECYSTECTOMY N/A 06/15/2015   Procedure: LAPAROSCOPIC CHOLECYSTECTOMY;  Surgeon: Ralene Ok, MD;  [facility];  Service: General;  Laterality: N/A;  . COLONOSCOPY  last one 03-28-2016  . CYSTOSCOPY W/ URETERAL STENT REMOVAL Right 06/26/2017   Procedure: CYSTOSCOPY WITH STENT REMOVAL;  Surgeon: Alexis Frock, MD;  [facility];  Service: Urology;  Laterality: Right;  . CYSTOSCOPY WITH RETROGRADE PYELOGRAM, URETEROSCOPY AND STENT PLACEMENT Bilateral 06/18/2013   Procedure: CYSTOSCOPY WITH RETROGRADE PYELOGRAM, Left URETEROSCOPY with laser , AND bilateral STENT PLACEMENT;  Surgeon: Alexis Frock, MD;  [facility];  Service: Urology;  Laterality: Bilateral;  . CYSTOSCOPY WITH RETROGRADE PYELOGRAM, URETEROSCOPY AND STENT PLACEMENT Right 08/08/2013   Procedure: CYSTOSCOPY WITH RIGHT RETROGRADE PYELOGRAM, URETEROSCOPY AND STENT PLACEMENT, stone extraction;  Surgeon: Alexis Frock, MD;  [facility];  Service: Urology;  Laterality: Right;  . CYSTOSCOPY WITH RETROGRADE PYELOGRAM, URETEROSCOPY  AND STENT PLACEMENT Right 08/10/2013   Procedure: 2ND STAGE CYSTOSCOPY WITH RETROGRADE PYELOGRAM, URETEROSCOPY AND STENT EXCHANGE;  Surgeon: Alexis Frock, MD;  [facility];  Service: Urology;  Laterality: Right;  . CYSTOSCOPY WITH RETROGRADE PYELOGRAM, URETEROSCOPY AND STENT PLACEMENT Bilateral 05/22/2017   Procedure: CYSTOSCOPY WITH RETROGRADE PYELOGRAM, URETEROSCOPY AND STENT PLACEMENT;  Surgeon: Alexis Frock, MD;  [facility];  Service: Urology;  Laterality: Bilateral;  . CYSTOSCOPY WITH  RETROGRADE PYELOGRAM, URETEROSCOPY AND STENT PLACEMENT Bilateral 06/05/2017   Procedure: CYSTOSCOPY WITH RETROGRADE PYELOGRAM, URETEROSCOPY AND STENT PLACEMENT;  Surgeon: Alexis Frock, MD;  [facility];  Service: Urology;  Laterality: Bilateral;  . CYSTOSCOPY WITH RETROGRADE PYELOGRAM, URETEROSCOPY AND STENT PLACEMENT Bilateral 08/13/2018   Procedure: CYSTOSCOPY WITH RETROGRADE PYELOGRAM, URETEROSCOPY AND STENT PLACEMENT;  Surgeon: Alexis Frock, MD;  [facility];  Service: Urology;  Laterality: Bilateral;  90 MINS  . CYSTOSCOPY/URETEROSCOPY/HOLMIUM LASER/STENT PLACEMENT Left 06/26/2017   Procedure: CYSTOSCOPY/URETEROSCOPY third stage/HOLMIUM LASER/STENT PLACEMENT retrograde pylegram;  Surgeon: Alexis Frock, MD;  [facility];  Service: Urology;  Laterality: Left;  Marland Kitchen GASTRIC ROUX-EN-Y  2004    @[facility]   . HOLMIUM LASER APPLICATION Bilateral 11/16/4126   Procedure: HOLMIUM LASER APPLICATION;  Surgeon: Alexis Frock, MD;  [facility];  Service: Urology;  Laterality: Bilateral;  . HOLMIUM LASER APPLICATION Left 11/16/6765   Procedure: HOLMIUM LASER APPLICATION;  Surgeon: Alexis Frock, MD;  [facility];  Service: Urology;  Laterality: Left;  . HOLMIUM LASER APPLICATION Bilateral 06/21/4707   Procedure: HOLMIUM LASER APPLICATION;  Surgeon: Alexis Frock, MD;  [facility];  Service: Urology;  Laterality: Bilateral;  . HOLMIUM LASER APPLICATION Bilateral 10/11/8364   Procedure: HOLMIUM LASER APPLICATION;  Surgeon: Alexis Frock, MD;  [facility];  Service: Urology;  Laterality: Bilateral;  . NEPHROLITHOTOMY Left 08/08/2013   Procedure: 1ST STAGE NEPHROLITHOTOMY PERCUTANEOUS WITH SURGEON ACCESS;  Surgeon: Alexis Frock, MD;  [facility];  Service: Urology;  Laterality: Left;  . NEPHROLITHOTOMY Left 08/10/2013   Procedure: NEPHROLITHOTOMY PERCUTANEOUS SECOND LOOK/LEFT DIGITAL URETEROSCOPY/BASKETING OF STONE/EXCHANGE OF LEFT URETERAL STENT;  Surgeon: Alexis Frock, MD;   [facility];  Service: Urology;  Laterality: Left;  . ORCHIOPEXY  child   unlateral undescended testis  . PERCUTANEOUS NEPHROSTOLITHOTOMY Left 09-19-2009   dr dahlstedt  @[facility]   . TONSILLECTOMY  child  . TRACHEOSTOMY  1987   s/p tongue reduction and nasal septum deviation repair for  Sleep apnea   . URETEROSCOPY WITH HOLMIUM LASER LITHOTRIPSY Left 04-14-2002   dr Diona Fanti  @[facility]   . WISDOM TOOTH EXTRACTION      Family History  Problem Relation Age of Onset  . Cancer Mother        Ovarian, died age 13.  . Diabetes Father   . Hypertension Father   . Heart failure Father        Died of MI age 9.  . Breast cancer Maternal Grandmother   . Colon cancer Maternal Grandfather   . Esophageal cancer Neg Hx   . Stomach cancer Neg Hx   . Rectal cancer Neg Hx    Social History:  reports that he quit smoking about 50 years ago. His smoking use included cigarettes. He has a 8.00 pack-year smoking history. He has never used smokeless tobacco. He reports previous alcohol use. He reports that he does not use drugs.  Allergies:  Allergies  Allergen Reactions  . Lisinopril Other (See Comments)    DIZZINESS    No medications prior to admission.  No results found for this or any previous visit (from the past 48 hour(s)). No results found.  Review of Systems  Constitutional: Negative for chills and fever.  Genitourinary: Positive for hematuria and urgency.  All other systems reviewed and are negative.   There were no vitals taken for this visit. Physical Exam  HENT:  Head: Normocephalic.  Trach in place.   Eyes: Pupils are equal, round, and reactive to light.  Neck: Normal range of motion.  Cardiovascular: Normal rate.  Respiratory: Effort normal.  GI:  Stable morbid truncal obesity.   Genitourinary:    Genitourinary Comments: No CVAT at present.    Musculoskeletal: Normal range of motion.  Neurological: He is alert.  Skin: Skin is warm.  Psychiatric: He has a normal mood and  affect.     Assessment/Plan  Proceed as planned with BILATERAL 2nd stage Ureteroscopic stone manipulation with goal of stone free. Risks, benefits, alternatves, expected peri-op course including need fir at least temporary ppost-op stents given bilateral surgery discussed.   Alexis Frock, MD 10/22/2018, 7:21 AM

## 2018-10-23 ENCOUNTER — Encounter (HOSPITAL_COMMUNITY): Payer: Self-pay | Admitting: Urology

## 2018-10-23 NOTE — Op Note (Signed)
NAME: MASUD, HOLUB MEDICAL RECORD [id number] ACCOUNT [id number] DATE OF BIRTH:07-10-1945 FACILITY: [facility] LOCATION: [facility] PHYSICIAN:Geniya Fulgham, MD  OPERATIVE REPORT  DATE OF PROCEDURE:  10/22/2018  PREOPERATIVE DIAGNOSIS:  Left greater than right renal stone.  POSTOPERATIVE DIAGNOSIS:  Left greater than right renal stone plus bilateral encrustation of stents.  PROCEDURE: 1.  Cystoscopy, bilateral retrograde pyelograms, interpretation. 2.  Bilateral ureteroscopy with laser lithotripsy, second-stage. 3.  Exchange of bilateral ureteral stents 5 x 24 Polaris with tether.  ESTIMATED BLOOD LOSS:  Nil.  COMPLICATIONS:  None.  SPECIMEN:  Bilateral renal and ureteral stone fragments for discard.  FINDINGS: 1.  Left greater than right intrarenal stone volume. 2.  Left greater than right significant encrustation of the in situ stents. 3.  Complete resolution of all accessible stone fragments larger than 130 mm following laser lithotripsy and basket extraction bilaterally. 4.  Distal replacement of bilateral ureteral stents, proximal end renal pelvis, distal end in urinary bladder, tethered.  INDICATIONS:  The patient is a very pleasant but comorbid 73 year old male with history of morbid obesity and tracheostomy.  He also has a history of gastric bypass with subsequent hyperoxaluria and recurrent large volume of urolithiasis.  He is status  post percutaneous surgery several years ago and has been on an aggressive path of medical management and surveillance imaging with preemptive ureteroscopy done for stone approaching 2 cm stone volume ipsilateral.  He met this criteria, and we discussed  options.  He wished to proceed with bilateral ureteroscopic stone manipulation with goal of preventing progression to a renal demise or need for percutaneous surgery.  He underwent first stage surgery in April of 2020.  However, due to viral pandemic,  his second-stage surgery was delayed as  he does have a significant pulmonary risk.  However, given his known lithogenic urine, risk balance was felt to no furhter delay.  He will be receiving a second-stage procedure today.  He presents for this.  Informed consent  was obtained and placed in the medical record.  PROCEDURE IN DETAIL:  The patient being identified, the procedure being bilateral second-stage ureteroscopic stone manipulation was confirmed.  Procedure timeout was performed.  IV antibiotics were administered.  General anesthesia was induced.  The  patient was placed into a low lithotomy position.  A sterile field was created by prepping and draping his penis, perineum and proximal thighs using iodine.  Cystourethroscopy was performed using a 21-French rigid cystoscope offset lens.  Inspection of  the urinary bladder revealed some small volume bladder stones, likely representing antegrade progression of intrarenal and ureteral stone burden.  These were easily flushed and set aside.  Bilateral stents were somewhat encrusted.  The right ureteral  stent was very carefully removed, set aside for discard, inspected and intact.  The right ureter was cannulated with a 6-French renal catheter and right retrograde pyelogram was obtained.  Right retrograde pyelogram demonstrated a single right ureter, single-system right kidney.  There was significant hydronephrosis noted.  No obvious filling defects.  A 0.03 ZIPwire was advanced to the lower pole and set aside as a safety wire.  Next, the  left stent was navigated out for about half of its length, at which point there was very significant resistance.  It was not felt to be safe to exert any further assistance due to avoiding risk of ureteral injury.  A semi-rigid ureteroscopy was  performed alongside the stent, and a significant volume of stone was noted in the midureter.  This appeared to be  adherent to the stent and preventing further removal.  With direct visualization with ureteroscopy, a  ZIPwire was advanced to the level of  the kidney, acting as a safety wire, and holmium laser energy applied to a setting of 0.3 joules and 30 Hz.  The stones along the stents were obliterated into much smaller fragments using a combination of dusting and fragmenting technique.  With clearing  the stones from the stent, the stent was then carefully removed under both ureteroscopic and fluoroscopic vision.  The site was intact, set aside for discard.  Semi-rigid ureteroscopy was performed of the remainder of the distal four-fifths of the left  ureter alongside a separate sensor working wire.  No residual stone volume at this location.  It was removed.  The semirigid scope was exchanged for a 12/14 medium length ureteral access sheath using continuous fluoroscopic guidance to the level of the  mid ureter, and a flexible digital ureteroscopy was performed in the proximal left ureter and systematic inspection of the left kidney, including all calices x3.  There was approximately 8 sq mm residual stone volume.  Most of it was fragmented into the  mid and lower pole calices.  This was amenable to basketing with an NCompass type basket, removed and set aside for discard.  The sheath was removed under continuous vision.  No significant mucosal abnormalities were found.  Next, semirigid ureteroscopy  was performed of the distal 1/2 of the right ureter alongside a sensor working wire.  Similarly, there was an intraureteral stone, mostly at the midureter.  Holmium laser energy applied at a setting of 0.3 joules and 30 Hz.  These were fragmented into  smaller pieces, which were amenable to basketing.  Having cleared the distal half of the ureter, the semirigid scope was exchanged again for the access sheath using continuous fluoroscopic guidance and flexible digital ureteroscopy performed in the  proximal right ureter, and systematic inspection of the right kidney, including all calices x3.  There were essentially no  right intrarenal stones noted.   No significant mucosal abnormalities were found.  Given bilateral access sheath usage and the complex nature of the surgery today, it was felt that brief interval stenting with tethered stents would be warranted.  As such, new 5 x 24  Polaris-type stents were placed bilaterally over the remaining safety wire using fluoroscopic guidance.  Good proximal and distal planes were noted.  The tether was left in place and fashioned to the dorsum of the penis.  The procedure was terminated.   The patient tolerated the procedure well.  No immediate perioperative complications.  The patient was taken to the postanesthesia care unit in stable condition.  Plan for discharge home.  LN/NUANCE  D:10/22/2018 T:10/23/2018 JOB:[id number]

## 2018-10-29 ENCOUNTER — Other Ambulatory Visit: Payer: Self-pay

## 2018-11-01 ENCOUNTER — Other Ambulatory Visit: Payer: Self-pay

## 2018-11-01 ENCOUNTER — Encounter: Payer: Self-pay | Admitting: Family Medicine

## 2018-11-01 ENCOUNTER — Ambulatory Visit (INDEPENDENT_AMBULATORY_CARE_PROVIDER_SITE_OTHER): Payer: Medicare HMO | Admitting: Family Medicine

## 2018-11-01 DIAGNOSIS — K219 Gastro-esophageal reflux disease without esophagitis: Secondary | ICD-10-CM | POA: Diagnosis not present

## 2018-11-01 DIAGNOSIS — N5201 Erectile dysfunction due to arterial insufficiency: Secondary | ICD-10-CM | POA: Diagnosis not present

## 2018-11-01 DIAGNOSIS — E7800 Pure hypercholesterolemia, unspecified: Secondary | ICD-10-CM

## 2018-11-01 DIAGNOSIS — I10 Essential (primary) hypertension: Secondary | ICD-10-CM

## 2018-11-01 DIAGNOSIS — N200 Calculus of kidney: Secondary | ICD-10-CM | POA: Diagnosis not present

## 2018-11-01 DIAGNOSIS — R82992 Hyperoxaluria: Secondary | ICD-10-CM | POA: Diagnosis not present

## 2018-11-01 NOTE — Progress Notes (Signed)
Subjective:    Patient ID: Dennis Spencer, male    DOB: 11-06-1945, 73 y.o.   MRN: [id number]   HPI: Dennis Spencer is a 73 y.o. male presenting for follow-up of hypertension. Patient has no history of headache chest pain or shortness of breath or recent cough. Patient also denies symptoms of TIA such as numbness weakness lateralizing. Patient checks  blood pressure at home and has not had any elevated readings recently. Patient denies side effects from his medication. States taking it regularly. Patient in for follow-up of elevated cholesterol. Doing well without complaints on current medication. Denies side effects of statin including myalgia and arthralgia and nausea.  Currently no chest pain, shortness of breath or other cardiovascular related symptoms noted. Patient in for follow-up of GERD. Currently asymptomatic taking  PPI daily. There is no chest pain or heartburn. No hematemesis and no melena. No dysphagia or choking. Onset is remote. Progression is stable. Complicating factors, none.    Depression screen [facility] 2/9 09/27/2018 05/28/2018 04/19/2018 04/09/2018 04/02/2018  Decreased Interest 1 1 0 0 1  Down, Depressed, Hopeless 1 1 0 0 0  PHQ - 2 Score 2 2 0 0 1  Altered sleeping 0 0 - - 0  Tired, decreased energy 1 2 - - 0  Change in appetite 0 0 - - 0  Feeling bad or failure about yourself  0 0 - - 0  Trouble concentrating 0 0 - - 0  Moving slowly or fidgety/restless 0 0 - - 0  Suicidal thoughts 0 0 - - 0  PHQ-9 Score 3 4 - - 1  Difficult doing work/chores Not difficult at all - - - -     Relevant past medical, surgical, family and social history reviewed and updated as indicated.  Interim medical history since our last visit reviewed. Allergies and medications reviewed and updated.  ROS:  Review of Systems  Constitutional: Negative.   HENT: Negative.   Eyes: Negative for visual disturbance.  Respiratory: Negative for cough and shortness of breath.   Cardiovascular:  Negative for chest pain and leg swelling.  Gastrointestinal: Negative for abdominal pain, diarrhea, nausea and vomiting.  Genitourinary: Negative for difficulty urinating.       Recent surgery for nephrolithiasis. Report reviewed  Musculoskeletal: Negative for arthralgias and myalgias.  Skin: Negative for rash.  Neurological: Negative for headaches.  Psychiatric/Behavioral: Negative for sleep disturbance.     Social History   Tobacco Use  Smoking Status Former Smoker  . Packs/day: 1.00  . Years: 8.00  . Pack years: 8.00  . Types: Cigarettes  . Quit date: 05/12/1968  . Years since quitting: 50.5  Smokeless Tobacco Never Used       Objective:     Wt Readings from Last 3 Encounters:  10/22/18 (!) 324 lb 14.4 oz (147.4 kg)  10/19/18 (!) 324 lb 14.4 oz (147.4 kg)  08/05/18 (!) 326 lb 6 oz (148 kg)     Exam deferred. Pt. Harboring due to COVID 19. Phone visit performed.   Assessment & Plan:   1. Essential hypertension   2. Gastroesophageal reflux disease without esophagitis   3. Hypercholesteremia     No orders of the defined types were placed in this encounter.   No orders of the defined types were placed in this encounter.     Diagnoses and all orders for this visit:  Essential hypertension  Gastroesophageal reflux disease without esophagitis  Hypercholesteremia    Virtual Visit via telephone  Note  I discussed the limitations, risks, security and privacy concerns of performing an evaluation and management service by telephone and the availability of in person appointments. The patient was identified with two identifiers. Pt.expressed understanding and agreed to proceed. Pt. Is at home. Dr. Livia Snellen is in his office.  Follow Up Instructions:   I discussed the assessment and treatment plan with the patient. The patient was provided an opportunity to ask questions and all were answered. The patient agreed with the plan and demonstrated an understanding of the  instructions.   The patient was advised to call back or seek an in-person evaluation if the symptoms worsen or if the condition fails to improve as anticipated.   Total minutes including chart review and phone contact time: 20   Follow up plan: No follow-ups on file.  Claretta Fraise, MD [place]

## 2018-11-19 ENCOUNTER — Other Ambulatory Visit: Payer: Self-pay | Admitting: Family Medicine

## 2018-11-19 DIAGNOSIS — E7800 Pure hypercholesterolemia, unspecified: Secondary | ICD-10-CM

## 2018-11-24 ENCOUNTER — Telehealth: Payer: Self-pay | Admitting: Family Medicine

## 2018-11-24 NOTE — Chronic Care Management (AMB) (Signed)
°  Chronic Care Management   Outreach Note  11/24/2018 Name: Dennis Spencer MRN: [id number] DOB: Dec 15, 1945  Referred by: Claretta Fraise, MD Reason for referral : Chronic Care Management (Initial CCM outreach was unsuccessful. )   An unsuccessful telephone outreach was attempted today. The patient was referred to the case management team by for assistance with chronic care management and care coordination.   Follow Up Plan: A HIPPA compliant phone message was left for the patient providing contact information and requesting a return call.  The care management team will reach out to the patient again over the next 7 days.  If patient returns call to provider office, please advise to call [place] at [place]  [email or web address]   ??[phone]

## 2018-11-30 NOTE — Chronic Care Management (AMB) (Signed)
Chronic Care Management   Note  11/30/2018 Name: Dennis Spencer MRN: [id number] DOB: 1946-05-10  Dennis Spencer is a 73 y.o. year old male who is a primary care patient of Stacks, Cletus Gash, MD. I reached out to [name] by phone today in response to a referral sent by Dennis Spencer health plan.    Mr. Jefferys was given information about Chronic Care Management services today including:  1. CCM service includes personalized support from designated clinical staff supervised by his physician, including individualized plan of care and coordination with other care providers 2. 24/7 contact phone numbers for assistance for urgent and routine care needs. 3. Service will only be billed when office clinical staff spend 20 minutes or more in a month to coordinate care. 4. Only one practitioner may furnish and bill the service in a calendar month. 5. The patient may stop CCM services at any time (effective at the end of the month) by phone call to the office staff. 6. The patient will be responsible for cost sharing (co-pay) of up to 20% of the service fee (after annual deductible is met).  Patient agreed to services and verbal consent obtained.   Follow up plan: Telephone appointment with CCM team member scheduled for: 12/09/2018  [place]  [email or web address]   ??[phone]

## 2018-12-09 ENCOUNTER — Ambulatory Visit (INDEPENDENT_AMBULATORY_CARE_PROVIDER_SITE_OTHER): Payer: Medicare HMO | Admitting: *Deleted

## 2018-12-09 DIAGNOSIS — M25562 Pain in left knee: Secondary | ICD-10-CM

## 2018-12-09 DIAGNOSIS — N183 Chronic kidney disease, stage 3 unspecified: Secondary | ICD-10-CM

## 2018-12-09 DIAGNOSIS — M1710 Unilateral primary osteoarthritis, unspecified knee: Secondary | ICD-10-CM

## 2018-12-09 DIAGNOSIS — G8929 Other chronic pain: Secondary | ICD-10-CM

## 2018-12-10 ENCOUNTER — Encounter: Payer: Self-pay | Admitting: *Deleted

## 2018-12-18 ENCOUNTER — Other Ambulatory Visit: Payer: Self-pay | Admitting: Family Medicine

## 2018-12-18 DIAGNOSIS — I10 Essential (primary) hypertension: Secondary | ICD-10-CM

## 2018-12-21 ENCOUNTER — Ambulatory Visit: Payer: Medicare HMO | Admitting: *Deleted

## 2018-12-21 DIAGNOSIS — M1710 Unilateral primary osteoarthritis, unspecified knee: Secondary | ICD-10-CM

## 2018-12-22 ENCOUNTER — Telehealth: Payer: Self-pay | Admitting: *Deleted

## 2018-12-22 ENCOUNTER — Encounter: Payer: Self-pay | Admitting: Family Medicine

## 2018-12-22 ENCOUNTER — Other Ambulatory Visit: Payer: Self-pay | Admitting: Family Medicine

## 2018-12-22 DIAGNOSIS — G8929 Other chronic pain: Secondary | ICD-10-CM

## 2018-12-22 NOTE — Patient Instructions (Addendum)
[place] (CPAP)  . The CPAP program, funded by the [employer], was developed for patients in the community who have private physicians but are having difficulty affording their medications.  . The medications are ordered free of charge from various drug manufacturer's prescription assistance programs for people meeting their eligibility criteria. Medications are ordered per individual, normally every three months.  . A person must be referred by his/her provider and have no forms of prescription insurance. There is no age limit.  . A registered pharmacist will provide a complete medication management review.  . The CPAP program now has approximately 290 patients enrolled. The goal is to help displaced workers and the Senior population in [place].  . CPAP is now assisting the Medicare population with the new Part D Prescription Drug Plan as long as the patients are not in the "donut hole". . Persons interested in this program many call [phone] for more information.    Visit Information  Goals Addressed              This Visit's Progress     Patient Stated   .  "I would like to improve my knee pain so that I can get around better and not be in as much pain" (pt-stated)        Current Barriers:  . Chronic Disease Management support and education needs related to arthritis  Nurse Case Manager Clinical Goal(s):  Marland Kitchen Over the next 7 days, patient will attend all scheduled medical appointments: 01/19/2019  Interventions:  Marland Kitchen Verified upcoming appointment with Dr Theda Sers at [place] [phone]) on 01/19/19 at 2:30 . Verified with patient that he is aware of the appointment information o Patient is looking forward to the visit and is optimistic about the outcome . RNCM will f/u with patient by telephone after his visit to discuss the plan of care  Patient Self Care  Activities:  . Performs ADL's independently . Performs IADL's independently  Please see past updates related to this goal by clicking on the "Past Updates" button in the selected goal        Other   .  Chronic Disease Management Needs        CARE PLAN ENTRY (see longtitudinal plan of care for additional care plan information)  Current Barriers:  . Chronic Disease Management support, education, and care coordination needs related to Obesity, HTN, CKD, Arthritis, bilateral chronic knee pain  Clinical Goal(s) related to Obesity, HTN, CKD, Arthritis, bilateral chronic knee pain:  Over the next 60 days, patient will:  . Work with the care management team to address educational, disease management, and care coordination needs  . Begin or continue self health monitoring activities as directed today Measure and record blood pressure 4 times per week . Call provider office for new or worsened signs and symptoms Blood pressure findings outside established parameters . Call care management team with questions or concerns . Verbalize basic understanding of patient centered plan of care established today  Interventions related to Obesity, HTN, CKD, Arthritis, bilateral chronic knee pain:  . Evaluation of current treatment plans and patient's adherence to plan as established by provider . Assessed patient understanding of disease states . Assessed patient's education and care coordination needs . Provided disease specific education to patient  . Collaborated with appropriate clinical care team members regarding patient needs  Patient Self Care Activities related to Obesity, HTN, CKD, Arthritis, bilateral chronic knee pain:  .  Patient is able to perform ADLs and IADLs independently   Initial goal documentation        Dennis Spencer was given information about Chronic Care Management services today including:  1. CCM service includes personalized support from designated clinical staff supervised  by his physician, including individualized plan of care and coordination with other care providers 2. 24/7 contact phone numbers for assistance for urgent and routine care needs. 3. Service will only be billed when office clinical staff spend 20 minutes or more in a month to coordinate care. 4. Only one practitioner may furnish and bill the service in a calendar month. 5. The patient may stop CCM services at any time (effective at the end of the month) by phone call to the office staff. 6. The patient will be responsible for cost sharing (co-pay) of up to 20% of the service fee (after annual deductible is met).  Patient agreed to services and verbal consent obtained.   Patient verbalizes understanding of instructions provided today.   The care management team will reach out to the patient again over the next 60 days.   Chong Sicilian, BSN, RN-BC Embedded Chronic Care Manager [facility] [place] [facility] Direct Dial: [phone]

## 2018-12-23 ENCOUNTER — Other Ambulatory Visit: Payer: Self-pay | Admitting: Family Medicine

## 2018-12-23 DIAGNOSIS — G8929 Other chronic pain: Secondary | ICD-10-CM

## 2018-12-23 DIAGNOSIS — M25562 Pain in left knee: Secondary | ICD-10-CM

## 2018-12-25 NOTE — Chronic Care Management (AMB) (Signed)
  Chronic Care Management   Care Coordination Note  12/25/2018 Name: Dennis Spencer MRN: [id number] DOB: 1945/08/21   Goals Addressed            This Visit's Progress     Patient Stated   . "I would like to improve my knee pain so that I can get around better and not be in as much pain" (pt-stated)       Current Barriers:  . Chronic Disease Management support and education needs related to arthritis  Nurse Case Manager Clinical Goal(s):  Marland Kitchen Over the next 7 days, patient will verbalize understanding of plan for follow up with orthopedic regarding knee pain  Interventions:  . Collaboration with PCP for order to ortho. Recommend Dr Wynelle Link with [facility]  Patient Self Care Activities:  . Performs ADL's independently . Performs IADL's independently  Initial goal documentation        Follow up plan: The care management team will reach out to the patient again over the next 30 days.   Chong Sicilian BSN, RN-BC Embedded Chronic Care Manager [facility] [place] [facility] Direct Dial: [phone]

## 2018-12-25 NOTE — Patient Instructions (Signed)
Visit Information  Goals Addressed            This Visit's Progress     Patient Stated   . "I would like to improve my knee pain so that I can get around better and not be in as much pain" (pt-stated)       Current Barriers:  . Chronic Disease Management support and education needs related to arthritis  Nurse Case Manager Clinical Goal(s):  Marland Kitchen Over the next 7 days, patient will verbalize understanding of plan for follow up with orthopedic regarding knee pain  Interventions:  . Collaboration with PCP for order to ortho. Recommend Dr Wynelle Link with [facility]  Patient Self Care Activities:  . Performs ADL's independently . Performs IADL's independently  Initial goal documentation        The patient verbalized understanding of instructions provided today and declined a print copy of patient instruction materials.   The care management team will reach out to the patient again over the next 30 days.    Chong Sicilian BSN, RN-BC Embedded Chronic Care Manager [facility] [place] [facility] Direct Dial: [phone]

## 2019-01-04 ENCOUNTER — Other Ambulatory Visit: Payer: Self-pay | Admitting: Family Medicine

## 2019-01-13 ENCOUNTER — Ambulatory Visit: Payer: Medicare HMO | Admitting: *Deleted

## 2019-01-13 DIAGNOSIS — G8929 Other chronic pain: Secondary | ICD-10-CM

## 2019-01-13 NOTE — Chronic Care Management (AMB) (Signed)
Chronic Care Management   Follow Up Note   01/13/2019 Name: KRUSE SHOUPE MRN: [id number] DOB: Aug 07, 1945  Referred by: Claretta Fraise, MD Reason for referral : Chronic Care Management (RNCM Follow up)   WORTHINGTON MURILLO is a 73 y.o. year old male who is a primary care patient of Stacks, Cletus Gash, MD. The CCM team was consulted for assistance with chronic disease management and care coordination needs.    Review of patient status, including review of consultants reports, relevant laboratory and other test results, and collaboration with appropriate care team members and the patient's provider was performed as part of comprehensive patient evaluation and provision of chronic care management services.    SDOH (Social Determinants of Health) screening performed today: Stress Physical Activity. See Care Plan for related entries.   I spoke with Mr Robson by telephone today. He is scheduled to see Dr Theda Sers with [facility] on 01/19/19 and is looking forward to developing a plan of care that will improve his knee pain and allow him more freedom on movement.   Outpatient Encounter Medications as of 01/13/2019  Medication Sig  . amLODipine (NORVASC) 5 MG tablet TAKE 1 TABLET BY MOUTH EVERY DAY IN THE MORNING  . atorvastatin (LIPITOR) 80 MG tablet TAKE 1 TABLET (80 MG TOTAL) BY MOUTH DAILY AT 6 PM.  . b complex vitamins tablet Take 1 tablet by mouth daily.  Marland Kitchen BRILINTA 60 MG TABS tablet TAKE 1 TABLET (60 MG TOTAL) BY MOUTH 2 (TWO) TIMES DAILY.  . Calcium Citrate-Vitamin D (CITRACAL + D PO) Take 1 tablet by mouth 2 (two) times daily.  . cephALEXin (KEFLEX) 500 MG capsule Take 1 capsule (500 mg total) by mouth 2 (two) times daily. X 3 days to prevent infection with stents in place  . Cholecalciferol (VITAMIN D3) 2000 UNITS capsule Take 2,000 Units by mouth 2 (two) times daily.   Marland Kitchen gabapentin (NEURONTIN) 600 MG tablet Take 0.5 tablets (300 mg total) by mouth daily.  . Multiple Vitamin (MULTIVITAMIN WITH  MINERALS) TABS tablet Take 1 tablet by mouth daily.  Marland Kitchen oxyCODONE-acetaminophen (PERCOCET) 5-325 MG tablet Take 1 tablet by mouth every 6 (six) hours as needed for moderate pain or severe pain. Post-operatively  . pantoprazole (PROTONIX) 40 MG tablet Take 1 tablet (40 mg total) by mouth daily. For stomach  . senna-docusate (SENOKOT-S) 8.6-50 MG tablet Take 1 tablet by mouth 2 (two) times daily. While taking strong pain meds to prevent constipation  . vitamin C (ASCORBIC ACID) 500 MG tablet Take 500 mg by mouth 2 (two) times daily.   No facility-administered encounter medications on file as of 01/13/2019.      Goals Addressed            This Visit's Progress     Patient Stated   . "I would like to improve my knee pain so that I can get around better and not be in as much pain" (pt-stated)       Current Barriers:  . Chronic Disease Management support and education needs related to arthritis  Nurse Case Manager Clinical Goal(s):  Marland Kitchen Over the next 7 days, patient will attend all scheduled medical appointments: 01/19/2019  Interventions:  Marland Kitchen Verified upcoming appointment with Dr Theda Sers at [facility] [phone]) on 01/19/19 at 2:30 . Verified with patient that he is aware of the appointment information o Patient is looking forward to the visit and is optimistic about the outcome . RNCM will f/u with patient by telephone after  his visit to discuss the plan of care  Patient Self Care Activities:  . Performs ADL's independently . Performs IADL's independently  Please see past updates related to this goal by clicking on the "Past Updates" button in the selected goal      . "I would like to increase my physical activity level" (pt-stated)       Current Barriers:  . Bilateral knee pain . Obesity . Covid 19 social distancing restrictions  Nurse Case Manager Clinical Goal(s):  Marland Kitchen Over the next 30 days, patient will work with [facility] to identify ways to increase physical  activity level with consideration for chronic knee pain  Interventions:  . RNCM will review ortho surgeons notes and recommendations after patient's visit on 01/19/2019 . RNCM will collaborate with patient's care team regarding plan of care for knee pain and how to increase activity level appropriately   Patient Self Care Activities:  . Performs ADL's independently . Performs IADL's independently  Initial goal documentation        Plan The care management team will reach out to the patient again over the next 14 days.   Appointment with Dr Theda Sers at [facility] on 01/19/2019   Chong Sicilian BSN, RN-BC [place] [facility] [place] Management Direct Dial: [phone]

## 2019-01-13 NOTE — Patient Instructions (Signed)
Visit Information  Goals Addressed            This Visit's Progress     Patient Stated   . "I would like to improve my knee pain so that I can get around better and not be in as much pain" (pt-stated)       Current Barriers:  . Chronic Disease Management support and education needs related to arthritis  Nurse Case Manager Clinical Goal(s):  Marland Kitchen Over the next 7 days, patient will attend all scheduled medical appointments: 01/19/2019  Interventions:  Marland Kitchen Verified upcoming appointment with Dr Theda Sers at [place] [phone]) on 01/19/19 at 2:30 . Verified with patient that he is aware of the appointment information o Patient is looking forward to the visit and is optimistic about the outcome . RNCM will f/u with patient by telephone after his visit to discuss the plan of care  Patient Self Care Activities:  . Performs ADL's independently . Performs IADL's independently  Please see past updates related to this goal by clicking on the "Past Updates" button in the selected goal      . "I would like to increase my physical activity level" (pt-stated)       Current Barriers:  . Bilateral knee pain . Obesity . Covid 19 social distancing restrictions  Nurse Case Manager Clinical Goal(s):  Marland Kitchen Over the next 30 days, patient will work with [facility] to identify ways to increase physical activity level with consideration for chronic knee pain  Interventions:  . RNCM will review ortho surgeons notes and recommendations after patient's visit on 01/19/2019 . RNCM will collaborate with patient's care team regarding plan of care for knee pain and how to increase activity level appropriately   Patient Self Care Activities:  . Performs ADL's independently . Performs IADL's independently  Initial goal documentation        The patient verbalized understanding of instructions provided today and declined a print copy of patient instruction materials.   Plan The care  management team will reach out to the patient again over the next 14 days.   Appointment with Dr Theda Sers at [facility] on 01/19/2019   Chong Sicilian BSN, RN-BC [place] [facility] [place] Management Direct Dial: [phone]

## 2019-01-19 DIAGNOSIS — M25562 Pain in left knee: Secondary | ICD-10-CM | POA: Diagnosis not present

## 2019-01-19 DIAGNOSIS — M25561 Pain in right knee: Secondary | ICD-10-CM | POA: Diagnosis not present

## 2019-01-19 DIAGNOSIS — M170 Bilateral primary osteoarthritis of knee: Secondary | ICD-10-CM | POA: Diagnosis not present

## 2019-01-26 ENCOUNTER — Encounter: Payer: Self-pay | Admitting: Physical Therapy

## 2019-01-26 ENCOUNTER — Ambulatory Visit: Payer: Medicare HMO | Attending: Specialist | Admitting: Physical Therapy

## 2019-01-26 ENCOUNTER — Other Ambulatory Visit: Payer: Self-pay

## 2019-01-26 DIAGNOSIS — G8929 Other chronic pain: Secondary | ICD-10-CM

## 2019-01-26 DIAGNOSIS — R262 Difficulty in walking, not elsewhere classified: Secondary | ICD-10-CM | POA: Diagnosis not present

## 2019-01-26 DIAGNOSIS — M25561 Pain in right knee: Secondary | ICD-10-CM | POA: Insufficient documentation

## 2019-01-26 DIAGNOSIS — M25562 Pain in left knee: Secondary | ICD-10-CM | POA: Insufficient documentation

## 2019-01-26 NOTE — Therapy (Signed)
[facility] [place], [place], [address] Phone: [phone]   Fax:  [phone]  Physical Therapy Evaluation  Patient Details  Name: Dennis Spencer MRN: [id number] Date of Birth: 1945-09-23 Referring Provider (PT): Sydnee Cabal, MD   Encounter Date: 01/26/2019  PT End of Session - 01/26/19 1857    Visit Number  1    Number of Visits  8    Date for PT Re-Evaluation  03/09/19    Authorization Type  FOTO; progress note every 10th visit; KX modifier at 15th visit    PT Start Time  0900    PT Stop Time  0943    PT Time Calculation (min)  43 min    Activity Tolerance  Patient tolerated treatment well    Behavior During Therapy  [facility] for tasks assessed/performed       Past Medical History:  Diagnosis Date  . Anticoagulant long-term use    brilinta  . Arthritis   . Carotid stenosis, bilateral    per duplex 04-23-2018  bilateral ICA <50%  . Chronic cough    per pt this normal due to tracheostomy  . CKD (chronic kidney disease), stage III ([place]) 11/10/2013  . GERD (gastroesophageal reflux disease)   . History of CVA (cerebrovascular accident) 04/13/2018   chronic lacunar infarct--- per pt no residuals  . History of kidney stones    multiple kidney stones  . History of osteomyelitis age 42   LLE  . Hyperlipidemia   . Hypertension   . OSA (obstructive sleep apnea) study in epic 09-25-2005  severe osa   1981 s/p tracheostomy, tongue reduction and septoplasty;  LEAVES TRACH OPEN AT NIGHT FOR SLEEP APNEA  . Pneumonia   . 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 Renal calculi    bilateral  . Severe obesity (BMI >= 40) (HCC)   . Urgency of urination   . Wears glasses     Past Surgical History:  Procedure Laterality Date  . CHOLECYSTECTOMY N/A 06/15/2015   Procedure: LAPAROSCOPIC CHOLECYSTECTOMY;  Surgeon: Ralene Ok, MD;  Location: [facility];  Service:  General;  Laterality: N/A;  . COLONOSCOPY  last one 03-28-2016  . CYSTOSCOPY W/ URETERAL STENT REMOVAL Right 06/26/2017   Procedure: CYSTOSCOPY WITH STENT REMOVAL;  Surgeon: Alexis Frock, MD;  Location: [facility];  Service: Urology;  Laterality: Right;  . CYSTOSCOPY W/ URETERAL STENT REMOVAL Bilateral 10/22/2018   Procedure: CYSTOSCOPY WITH STENT REMOVAL;  Surgeon: Alexis Frock, MD;  Location: [facility];  Service: Urology;  Laterality: Bilateral;  . CYSTOSCOPY WITH RETROGRADE PYELOGRAM, URETEROSCOPY AND STENT PLACEMENT Bilateral 06/18/2013   Procedure: CYSTOSCOPY WITH RETROGRADE PYELOGRAM, Left URETEROSCOPY with laser , AND bilateral STENT PLACEMENT;  Surgeon: Alexis Frock, MD;  Location: [facility];  Service: Urology;  Laterality: Bilateral;  . CYSTOSCOPY WITH RETROGRADE PYELOGRAM, URETEROSCOPY AND STENT PLACEMENT Right 08/08/2013   Procedure: CYSTOSCOPY WITH RIGHT RETROGRADE PYELOGRAM, URETEROSCOPY AND STENT PLACEMENT, stone extraction;  Surgeon: Alexis Frock, MD;  Location: [facility];  Service: Urology;  Laterality: Right;  . CYSTOSCOPY WITH RETROGRADE PYELOGRAM, URETEROSCOPY AND STENT PLACEMENT Right 08/10/2013   Procedure: 2ND STAGE CYSTOSCOPY WITH RETROGRADE PYELOGRAM, URETEROSCOPY AND STENT EXCHANGE;  Surgeon: Alexis Frock, MD;  Location: [facility];  Service: Urology;  Laterality: Right;  . CYSTOSCOPY WITH RETROGRADE PYELOGRAM, URETEROSCOPY AND STENT PLACEMENT Bilateral 05/22/2017   Procedure: CYSTOSCOPY WITH RETROGRADE PYELOGRAM, URETEROSCOPY AND STENT PLACEMENT;  Surgeon: Alexis Frock, MD;  Location: [facility];  Service: Urology;  Laterality: Bilateral;  . CYSTOSCOPY WITH RETROGRADE PYELOGRAM, URETEROSCOPY AND STENT PLACEMENT Bilateral 06/05/2017   Procedure: CYSTOSCOPY WITH RETROGRADE PYELOGRAM, URETEROSCOPY AND STENT PLACEMENT;  Surgeon: Alexis Frock, MD;  [facility]: [facility];  Service: Urology;  Laterality: Bilateral;  . CYSTOSCOPY WITH RETROGRADE PYELOGRAM, URETEROSCOPY AND STENT PLACEMENT Bilateral  08/13/2018   Procedure: CYSTOSCOPY WITH RETROGRADE PYELOGRAM, URETEROSCOPY AND STENT PLACEMENT;  Surgeon: Alexis Frock, MD;  Location: [facility];  Service: Urology;  Laterality: Bilateral;  90 MINS  . CYSTOSCOPY WITH RETROGRADE PYELOGRAM, URETEROSCOPY AND STENT PLACEMENT Bilateral 10/22/2018   Procedure: CYSTOSCOPY WITH RETROGRADE PYELOGRAM, URETEROSCOPY  STONE BASKETRY AND STENT PLACEMENT;  Surgeon: Alexis Frock, MD;  Location: [facility];  Service: Urology;  Laterality: Bilateral;  . CYSTOSCOPY/URETEROSCOPY/HOLMIUM LASER/STENT PLACEMENT Left 06/26/2017   Procedure: CYSTOSCOPY/URETEROSCOPY third stage/HOLMIUM LASER/STENT PLACEMENT retrograde pylegram;  Surgeon: Alexis Frock, MD;  Location: [facility];  Service: Urology;  Laterality: Left;  Marland Kitchen GASTRIC ROUX-EN-Y  2004    @[facility]   . HOLMIUM LASER APPLICATION Bilateral [id number]   Procedure: HOLMIUM LASER APPLICATION;  Surgeon: Alexis Frock, MD;  Location: [facility];  Service: Urology;  Laterality: Bilateral;  . HOLMIUM LASER APPLICATION Left [id number]   Procedure: HOLMIUM LASER APPLICATION;  Surgeon: Alexis Frock, MD;  [facility]: [facility];  Service: Urology;  Laterality: Left;  . HOLMIUM LASER APPLICATION Bilateral [id number]   Procedure: HOLMIUM LASER APPLICATION;  Surgeon: Alexis Frock, MD;  Location: [facility];  Service: Urology;  Laterality: Bilateral;  . HOLMIUM LASER APPLICATION Bilateral [id number]   Procedure: HOLMIUM LASER APPLICATION;  Surgeon: Alexis Frock, MD;  Location: [facility];  Service: Urology;  Laterality: Bilateral;  . HOLMIUM LASER APPLICATION Bilateral [id number]   Procedure: HOLMIUM LASER APPLICATION;  Surgeon: Alexis Frock, MD;  [facility]: [facility];  Service: Urology;  Laterality: Bilateral;  . NEPHROLITHOTOMY Left 08/08/2013   Procedure: 1ST STAGE NEPHROLITHOTOMY PERCUTANEOUS WITH SURGEON ACCESS;  Surgeon: Alexis Frock, MD;  Location: [facility];  Service: Urology;  Laterality: Left;  . NEPHROLITHOTOMY Left 08/10/2013   Procedure:  NEPHROLITHOTOMY PERCUTANEOUS SECOND LOOK/LEFT DIGITAL URETEROSCOPY/BASKETING OF STONE/EXCHANGE OF LEFT URETERAL STENT;  Surgeon: Alexis Frock, MD;  Location: [facility];  Service: Urology;  Laterality: Left;  . ORCHIOPEXY  child   unlateral undescended testis  . PERCUTANEOUS NEPHROSTOLITHOTOMY Left 09-19-2009   dr Diona Fanti  @[facility]   . TONSILLECTOMY  child  . TRACHEOSTOMY  1987   s/p tongue reduction and nasal septum deviation repair for  Sleep apnea   . URETEROSCOPY WITH HOLMIUM LASER LITHOTRIPSY Left 04-14-2002   dr dahlstedt  @[facility]   . WISDOM TOOTH EXTRACTION      There were no vitals filed for this visit.   Subjective Assessment - 01/26/19 1846    Subjective  COVID-19 screening performed upon arrival.Patient arrives to physical therapy with reports of bilateral knee pain R>L that exacerbated in March 2019. Patient reports x-ray found bilateral OA. Patient reports difficulties with standing and walking and has difficulties and pain with standing to cook meals. Patient reports stiffness in both knees and reports movement helps reduce stiffness. Patient has had increased difficulties with negotiating stairs and with clearing his leg to enter and exit tub shower. Patient reports pain at worst as 8/10 and pain at best as 0-1/10. Patient's goals are to decrease pain, improve movement, improve strength, and improve standing tolerance to cook and perform other home and recreational activities.    Pertinent History  HTN, tracheostomy,    Limitations  Standing;Walking;House  hold activities    How long can you stand comfortably?  10-15 mins    How long can you walk comfortably?  15-20 minutes    Diagnostic tests  X-ray: bilateral knee OA    Patient Stated Goals  improve mobility and decrease pain    Currently in Pain?  Yes    Pain Score  2     Pain Location  Knee    Pain Orientation  Right;Left    Pain Descriptors / Indicators  Dull;Aching    Pain Type  Chronic pain    Pain Onset  More than a month ago     Pain Frequency  Constant    Aggravating Factors   standing, walking, getting up from sitting too long    Pain Relieving Factors  moving and sitting    Effect of Pain on Daily Activities  pain with meal preparation and standing activities.         [facility] PT Assessment - 01/26/19 0001      Assessment   Medical Diagnosis  bilateral primary osteoarthritis of knee    Referring Provider (PT)  Sydnee Cabal, MD    Onset Date/Surgical Date  --   ongoing, >3 years; exacerbation 07/2017   Next MD Visit  02/16/2019   staring gel injections   Prior Therapy  no      Precautions   Precautions  None      Restrictions   Weight Bearing Restrictions  No      Balance Screen   Has the patient fallen in the past 6 months  No    Has the patient had a decrease in activity level because of a fear of falling?   No    Is the patient reluctant to leave their home because of a fear of falling?   No      Home Film/video editor residence    Living Arrangements  Spouse/significant other    Type of [place] to enter    Entrance Stairs-Number of Steps  2    Entrance Stairs-Rails  Can reach both      Prior Function   Level of Independence  Independent      Observation/Other Assessments   Focus on Therapeutic Outcomes (FOTO)   53% limitation      ROM / Strength   AROM / PROM / Strength  AROM;PROM;Strength      AROM   AROM Assessment Site  Knee    Right/Left Knee  Right;Left    Right Knee Extension  0    Right Knee Flexion  107    Left Knee Extension  2    Left Knee Flexion  113      PROM   PROM Assessment Site  Knee    Right/Left Knee  Right;Left    Right Knee Extension  0    Right Knee Flexion  108    Left Knee Extension  0    Left Knee Flexion  118      Strength   Strength Assessment Site  Knee    Right/Left Knee  Right;Left    Right Knee Flexion  3+/5    Right Knee Extension  3+/5    Left Knee Flexion  4/5    Left Knee Extension   4/5      Palpation   Patella mobility  decreased bilateral patella mobilization    Palpation comment  minimal tenderness  to palpation bilaterally      Ambulation/Gait   Gait Pattern  Step-through pattern;Decreased stride length;Decreased dorsiflexion - right;Decreased dorsiflexion - left;Wide base of support                Objective measurements completed on examination: See above findings.              PT Education - 01/26/19 1850    Education Details  quad sets, heel slides, hip abduction, hip adduction    Person(s) Educated  Patient    Methods  Explanation;Demonstration;Handout    Comprehension  Verbalized understanding;Returned demonstration          PT Long Term Goals - 01/26/19 1858      PT LONG TERM GOAL #1   Title  Patient will be independent with HEP    Time  4    Period  Weeks    Status  New      PT LONG TERM GOAL #2   Title  Patient will demonstrate 115+ degrees of bilateral knee flexion AROM to improve ability to perform functional task    Time  4    Period  Weeks    Status  New      PT LONG TERM GOAL #3   Title  Patient will demonstrate 4+/5 or greater bilateral knee MMT in all planes to improve stability during functional tasks.    Time  4    Period  Weeks    Status  New      PT LONG TERM GOAL #4   Title  Patient will report ability to stand for 30 minutes or greater with bilateral knee pain less than or equal to 3/10 for meal preparation    Time  4    Period  Weeks    Status  New             Plan - 01/26/19 2008    Clinical Impression Statement  Patient is a 72 year old male who presents to physical therapy with bilateral knee pain R>L, decreased ROM bilaterally, and decreased knee MMT bilaterally. Patient denied any tenderness to palpation and reports deep lateral pain in both knees. Patient ambulates with a decreased stride length, wide base of support, and decreased bilateral knee flexion during swing. Patient provided  with HEP to which patient reported understanding. Patient would benefit from skilled physical therapy to address deficits and address patient's goals.    Personal Factors and Comorbidities  Age;Comorbidity 1    Comorbidities  HTN, tracheostomy    Examination-Activity Limitations  Stairs;Stand    Examination-Participation Restrictions  Meal Prep    Stability/Clinical Decision Making  Stable/Uncomplicated    Clinical Decision Making  Low    Rehab Potential  Good    PT Frequency  2x / week    PT Duration  4 weeks    PT Treatment/Interventions  ADLs/Self Care Home Management;Moist Heat;Ultrasound;Electrical Stimulation;Cryotherapy;Gait training;Stair training;Functional mobility training;Therapeutic activities;Therapeutic exercise;Balance training;Neuromuscular re-education;Patient/family education;Passive range of motion;Manual techniques;Taping;Vasopneumatic Device    PT Next Visit Plan  nustep, gentle strengthening of LEs, modalities PRN for pain relief    PT Home Exercise Plan  see patient education section    Consulted and Agree with Plan of Care  Patient       Patient will benefit from skilled therapeutic intervention in order to improve the following deficits and impairments:  Decreased activity tolerance, Decreased strength, Decreased range of motion, Difficulty walking, Pain  Visit Diagnosis: Chronic pain of right knee -  Plan: PT plan of care cert/re-cert  Chronic pain of left knee - Plan: PT plan of care cert/re-cert  Difficulty in walking, not elsewhere classified - Plan: PT plan of care cert/re-cert     Problem List Patient Active Problem List   Diagnosis Date Noted  . Bilateral chronic knee pain 03/01/2018  . Severe obesity (BMI >= 40) ([place]) 11/12/2013  . GERD (gastroesophageal reflux disease) 11/10/2013  . OSA (obstructive sleep apnea) 11/10/2013  . CKD (chronic kidney disease), stage III ([place]) 11/10/2013  . Hyperglycemia 11/10/2013  . Hypertension   .  Hypercholesteremia    Gabriela Eves, PT, DPT 01/26/2019, 8:13 PM  [facility] [address] [place], [place], [address] Phone: [phone]   Fax:  [phone]  Name: Dennis Spencer MRN: [id number] Date of Birth: [birth year]

## 2019-01-28 ENCOUNTER — Ambulatory Visit: Payer: Medicare HMO | Admitting: Physical Therapy

## 2019-01-28 ENCOUNTER — Encounter: Payer: Self-pay | Admitting: Physical Therapy

## 2019-01-28 ENCOUNTER — Other Ambulatory Visit: Payer: Self-pay

## 2019-01-28 DIAGNOSIS — R262 Difficulty in walking, not elsewhere classified: Secondary | ICD-10-CM

## 2019-01-28 DIAGNOSIS — M25562 Pain in left knee: Secondary | ICD-10-CM

## 2019-01-28 DIAGNOSIS — G8929 Other chronic pain: Secondary | ICD-10-CM | POA: Diagnosis not present

## 2019-01-28 DIAGNOSIS — M25561 Pain in right knee: Secondary | ICD-10-CM | POA: Diagnosis not present

## 2019-01-28 NOTE — Therapy (Signed)
[facility] [place], [place], [address] Phone: [phone]   Fax:  [phone]  Physical Therapy Treatment  Patient Details  Name: Dennis Spencer MRN: [id number] Date of Birth: [birth year] Referring Provider (PT): Sydnee Cabal, MD   Encounter Date: 01/28/2019  PT End of Session - 01/28/19 0733    Visit Number  2    Number of Visits  8    Date for PT Re-Evaluation  03/09/19    Authorization Type  FOTO; progress note every 10th visit; KX modifier at 15th visit    PT Start Time  0730    PT Stop Time  0814    PT Time Calculation (min)  44 min    Activity Tolerance  Patient tolerated treatment well    Behavior During Therapy  [facility] for tasks assessed/performed       Past Medical History:  Diagnosis Date  . Anticoagulant long-term use    brilinta  . Arthritis   . Carotid stenosis, bilateral    per duplex 04-23-2018  bilateral ICA <50%  . Chronic cough    per pt this normal due to tracheostomy  . CKD (chronic kidney disease), stage III ([place]) 11/10/2013  . GERD (gastroesophageal reflux disease)   . History of CVA (cerebrovascular accident) 04/13/2018   chronic lacunar infarct--- per pt no residuals  . History of kidney stones    multiple kidney stones  . History of osteomyelitis age 22   LLE  . Hyperlipidemia   . Hypertension   . OSA (obstructive sleep apnea) study in epic 09-25-2005  severe osa   1981 s/p tracheostomy, tongue reduction and septoplasty;  LEAVES TRACH OPEN AT NIGHT FOR SLEEP APNEA  . Pneumonia   . 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 Renal calculi    bilateral  . Severe obesity (BMI >= 40) (HCC)   . Urgency of urination   . Wears glasses     Past Surgical History:  Procedure Laterality Date  . CHOLECYSTECTOMY N/A 06/15/2015   Procedure: LAPAROSCOPIC CHOLECYSTECTOMY;  Surgeon: Ralene Ok, MD;  [facility];  Service:  General;  Laterality: N/A;  . COLONOSCOPY  last one 03-28-2016  . CYSTOSCOPY W/ URETERAL STENT REMOVAL Right 06/26/2017   Procedure: CYSTOSCOPY WITH STENT REMOVAL;  Surgeon: Alexis Frock, MD;  [facility];  Service: Urology;  Laterality: Right;  . CYSTOSCOPY W/ URETERAL STENT REMOVAL Bilateral 10/22/2018   Procedure: CYSTOSCOPY WITH STENT REMOVAL;  Surgeon: Alexis Frock, MD;  [facility];  Service: Urology;  Laterality: Bilateral;  . CYSTOSCOPY WITH RETROGRADE PYELOGRAM, URETEROSCOPY AND STENT PLACEMENT Bilateral 06/18/2013   Procedure: CYSTOSCOPY WITH RETROGRADE PYELOGRAM, Left URETEROSCOPY with laser , AND bilateral STENT PLACEMENT;  Surgeon: Alexis Frock, MD;  [facility];  Service: Urology;  Laterality: Bilateral;  . CYSTOSCOPY WITH RETROGRADE PYELOGRAM, URETEROSCOPY AND STENT PLACEMENT Right 08/08/2013   Procedure: CYSTOSCOPY WITH RIGHT RETROGRADE PYELOGRAM, URETEROSCOPY AND STENT PLACEMENT, stone extraction;  Surgeon: Alexis Frock, MD;  [facility];  Service: Urology;  Laterality: Right;  . CYSTOSCOPY WITH RETROGRADE PYELOGRAM, URETEROSCOPY AND STENT PLACEMENT Right 08/10/2013   Procedure: 2ND STAGE CYSTOSCOPY WITH RETROGRADE PYELOGRAM, URETEROSCOPY AND STENT EXCHANGE;  Surgeon: Alexis Frock, MD;  [facility];  Service: Urology;  Laterality: Right;  . CYSTOSCOPY WITH RETROGRADE PYELOGRAM, URETEROSCOPY AND STENT PLACEMENT Bilateral 05/22/2017   Procedure: CYSTOSCOPY WITH RETROGRADE PYELOGRAM, URETEROSCOPY AND STENT PLACEMENT;  Surgeon: Alexis Frock, MD;  [facility];  Service: Urology;  Laterality: Bilateral;  . CYSTOSCOPY WITH RETROGRADE PYELOGRAM, URETEROSCOPY AND STENT PLACEMENT Bilateral 06/05/2017   Procedure: CYSTOSCOPY WITH RETROGRADE PYELOGRAM, URETEROSCOPY AND STENT PLACEMENT;  Surgeon: Alexis Frock, MD;  [facility];  Service: Urology;  Laterality: Bilateral;  . CYSTOSCOPY WITH RETROGRADE PYELOGRAM, URETEROSCOPY AND STENT PLACEMENT Bilateral  08/13/2018   Procedure: CYSTOSCOPY WITH RETROGRADE PYELOGRAM, URETEROSCOPY AND STENT PLACEMENT;  Surgeon: Alexis Frock, MD;  [facility];  Service: Urology;  Laterality: Bilateral;  90 MINS  . CYSTOSCOPY WITH RETROGRADE PYELOGRAM, URETEROSCOPY AND STENT PLACEMENT Bilateral 10/22/2018   Procedure: CYSTOSCOPY WITH RETROGRADE PYELOGRAM, URETEROSCOPY  STONE BASKETRY AND STENT PLACEMENT;  Surgeon: Alexis Frock, MD;  [facility];  Service: Urology;  Laterality: Bilateral;  . CYSTOSCOPY/URETEROSCOPY/HOLMIUM LASER/STENT PLACEMENT Left 06/26/2017   Procedure: CYSTOSCOPY/URETEROSCOPY third stage/HOLMIUM LASER/STENT PLACEMENT retrograde pylegram;  Surgeon: Alexis Frock, MD;  [facility];  Service: Urology;  Laterality: Left;  Marland Kitchen GASTRIC ROUX-EN-Y  2004    @[facility]   . HOLMIUM LASER APPLICATION Bilateral [id number]   Procedure: HOLMIUM LASER APPLICATION;  Surgeon: Alexis Frock, MD;  [facility];  Service: Urology;  Laterality: Bilateral;  . HOLMIUM LASER APPLICATION Left [id number]   Procedure: HOLMIUM LASER APPLICATION;  Surgeon: Alexis Frock, MD;  [facility];  Service: Urology;  Laterality: Left;  . HOLMIUM LASER APPLICATION Bilateral [id number]   Procedure: HOLMIUM LASER APPLICATION;  Surgeon: Alexis Frock, MD;  [facility];  Service: Urology;  Laterality: Bilateral;  . HOLMIUM LASER APPLICATION Bilateral [id number]   Procedure: HOLMIUM LASER APPLICATION;  Surgeon: Alexis Frock, MD;  [facility];  Service: Urology;  Laterality: Bilateral;  . HOLMIUM LASER APPLICATION Bilateral [id number]   Procedure: HOLMIUM LASER APPLICATION;  Surgeon: Alexis Frock, MD;  [facility];  Service: Urology;  Laterality: Bilateral;  . NEPHROLITHOTOMY Left 08/08/2013   Procedure: 1ST STAGE NEPHROLITHOTOMY PERCUTANEOUS WITH SURGEON ACCESS;  Surgeon: Alexis Frock, MD;  [facility];  Service: Urology;  Laterality: Left;  . NEPHROLITHOTOMY Left 08/10/2013   Procedure:  NEPHROLITHOTOMY PERCUTANEOUS SECOND LOOK/LEFT DIGITAL URETEROSCOPY/BASKETING OF STONE/EXCHANGE OF LEFT URETERAL STENT;  Surgeon: Alexis Frock, MD;  [facility];  Service: Urology;  Laterality: Left;  . ORCHIOPEXY  child   unlateral undescended testis  . PERCUTANEOUS NEPHROSTOLITHOTOMY Left 09-19-2009   dr dahlstedt  @[facility]   . TONSILLECTOMY  child  . TRACHEOSTOMY  1987   s/p tongue reduction and nasal septum deviation repair for  Sleep apnea   . URETEROSCOPY WITH HOLMIUM LASER LITHOTRIPSY Left 04-14-2002   dr dahlstedt  @[facility]   . WISDOM TOOTH EXTRACTION      There were no vitals filed for this visit.  Subjective Assessment - 01/28/19 0732    Subjective  COVID 19 screening performed on patient upon arrival. Patient reports that yesterday his L knee was hurting more and was depending more on the R knee due to pain.    Pertinent History  HTN, tracheostomy,    Limitations  Standing;Walking;House hold activities    How long can you stand comfortably?  10-15 mins    How long can you walk comfortably?  15-20 minutes    Diagnostic tests  X-ray: bilateral knee OA    Patient Stated Goals  improve mobility and decrease pain    Currently in Pain?  Yes    Pain Score  4     Pain Location  Knee    Pain Orientation  Left;Right    Pain Descriptors /  Indicators  Sore;Aching;Sharp    Pain Type  Chronic pain    Pain Onset  More than a month ago    Pain Frequency  Constant         [facility] PT Assessment - 01/28/19 0001      Assessment   Medical Diagnosis  bilateral primary osteoarthritis of knee    Referring Provider (PT)  Sydnee Cabal, MD    Next MD Visit  02/16/2019    Prior Therapy  no      Precautions   Precautions  None      Restrictions   Weight Bearing Restrictions  No                   [facility] Adult PT Treatment/Exercise - 01/28/19 0001      Exercises   Exercises  Knee/Hip      Knee/Hip Exercises: Aerobic   Nustep  L4, seat 12 x11 min      Knee/Hip Exercises:  Standing   Terminal Knee Extension  Strengthening;Both;15 reps;Theraband    Theraband Level (Terminal Knee Extension)  Level 2 (Red)    Hip Abduction  AROM;Both;20 reps;Knee straight    Rocker Board  3 minutes      Knee/Hip Exercises: Seated   Long Arc Quad  Strengthening;Both;20 reps;Weights    Long Arc Quad Weight  3 lbs.    Clamshell with TheraBand  Green   x20 reps   Hamstring Curl  Strengthening;Both;20 reps;Limitations    Hamstring Limitations  green theraband      Modalities   Modalities  Vasopneumatic      Vasopneumatic   Number Minutes Vasopneumatic   10 minutes    Vasopnuematic Location   Knee    Vasopneumatic Pressure  Low    Vasopneumatic Temperature   34                  PT Long Term Goals - 01/26/19 1858      PT LONG TERM GOAL #1   Title  Patient will be independent with HEP    Time  4    Period  Weeks    Status  New      PT LONG TERM GOAL #2   Title  Patient will demonstrate 115+ degrees of bilateral knee flexion AROM to improve ability to perform functional task    Time  4    Period  Weeks    Status  New      PT LONG TERM GOAL #3   Title  Patient will demonstrate 4+/5 or greater bilateral knee MMT in all planes to improve stability during functional tasks.    Time  4    Period  Weeks    Status  New      PT LONG TERM GOAL #4   Title  Patient will report ability to stand for 30 minutes or greater with bilateral knee pain less than or equal to 3/10 for meal preparation    Time  4    Period  Weeks    Status  New            Plan - 01/28/19 0809    Clinical Impression Statement  Patient presented in clinic with reports of B knee pain with variance in regards to which knee is worse. Patient guided through light stretching and strengthening exercises in which patient did not report any knee pain. Normal vasopneumatic response to B knees completed today upon end of treatment. Stiffness presented from keeping  knees straight during modalities  session.    Personal Factors and Comorbidities  Age;Comorbidity 1    Comorbidities  HTN, tracheostomy    Examination-Activity Limitations  Stairs;Stand    Examination-Participation Restrictions  Meal Prep    Stability/Clinical Decision Making  Stable/Uncomplicated    Rehab Potential  Good    PT Frequency  2x / week    PT Duration  4 weeks    PT Treatment/Interventions  ADLs/Self Care Home Management;Moist Heat;Ultrasound;Electrical Stimulation;Cryotherapy;Gait training;Stair training;Functional mobility training;Therapeutic activities;Therapeutic exercise;Balance training;Neuromuscular re-education;Patient/family education;Passive range of motion;Manual techniques;Taping;Vasopneumatic Device    PT Next Visit Plan  nustep, gentle strengthening of LEs, modalities PRN for pain relief    PT Home Exercise Plan  see patient education section    Consulted and Agree with Plan of Care  Patient       Patient will benefit from skilled therapeutic intervention in order to improve the following deficits and impairments:  Decreased activity tolerance, Decreased strength, Decreased range of motion, Difficulty walking, Pain  Visit Diagnosis: Chronic pain of right knee  Chronic pain of left knee  Difficulty in walking, not elsewhere classified     Problem List Patient Active Problem List   Diagnosis Date Noted  . Bilateral chronic knee pain 03/01/2018  . Severe obesity (BMI >= 40) ([place]) 11/12/2013  . GERD (gastroesophageal reflux disease) 11/10/2013  . OSA (obstructive sleep apnea) 11/10/2013  . CKD (chronic kidney disease), stage III ([place]) 11/10/2013  . Hyperglycemia 11/10/2013  . Hypertension   . Hypercholesteremia     Standley Brooking, PTA 01/28/2019, 8:23 AM  [facility] [address] [place], [place], [address] Phone: [phone]   Fax:  [phone]  Name: Dennis Spencer MRN: [id number] Date of Birth: 1945-07-29

## 2019-02-01 ENCOUNTER — Encounter: Payer: Self-pay | Admitting: Physical Therapy

## 2019-02-01 ENCOUNTER — Ambulatory Visit: Payer: Medicare HMO | Admitting: Physical Therapy

## 2019-02-01 ENCOUNTER — Other Ambulatory Visit: Payer: Self-pay

## 2019-02-01 DIAGNOSIS — R262 Difficulty in walking, not elsewhere classified: Secondary | ICD-10-CM

## 2019-02-01 DIAGNOSIS — M25562 Pain in left knee: Secondary | ICD-10-CM | POA: Diagnosis not present

## 2019-02-01 DIAGNOSIS — M25561 Pain in right knee: Secondary | ICD-10-CM | POA: Diagnosis not present

## 2019-02-01 DIAGNOSIS — G8929 Other chronic pain: Secondary | ICD-10-CM | POA: Diagnosis not present

## 2019-02-01 NOTE — Therapy (Signed)
[facility] [place], [place], [address] Phone: [phone]   Fax:  [phone]  Physical Therapy Treatment  Patient Details  Name: Dennis Spencer MRN: [id number] Date of Birth: August 23, 1945 Referring Provider (PT): Sydnee Cabal, MD   Encounter Date: 02/01/2019  PT End of Session - 02/01/19 0907    Visit Number  3    Number of Visits  8    Date for PT Re-Evaluation  03/09/19    Authorization Type  FOTO; progress note every 10th visit; KX modifier at 15th visit    PT Start Time  0900    PT Stop Time  0948    PT Time Calculation (min)  48 min    Activity Tolerance  Patient tolerated treatment well    Behavior During Therapy  [facility] for tasks assessed/performed       Past Medical History:  Diagnosis Date  . Anticoagulant long-term use    brilinta  . Arthritis   . Carotid stenosis, bilateral    per duplex 04-23-2018  bilateral ICA <50%  . Chronic cough    per pt this normal due to tracheostomy  . CKD (chronic kidney disease), stage III ([place]) 11/10/2013  . GERD (gastroesophageal reflux disease)   . History of CVA (cerebrovascular accident) 04/13/2018   chronic lacunar infarct--- per pt no residuals  . History of kidney stones    multiple kidney stones  . History of osteomyelitis age 44   LLE  . Hyperlipidemia   . Hypertension   . OSA (obstructive sleep apnea) study in epic 09-25-2005  severe osa   1981 s/p tracheostomy, tongue reduction and septoplasty;  LEAVES TRACH OPEN AT NIGHT FOR SLEEP APNEA  . Pneumonia   . 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 Renal calculi    bilateral  . Severe obesity (BMI >= 40) (HCC)   . Urgency of urination   . Wears glasses     Past Surgical History:  Procedure Laterality Date  . CHOLECYSTECTOMY N/A 06/15/2015   Procedure: LAPAROSCOPIC CHOLECYSTECTOMY;  Surgeon: Ralene Ok, MD;  [facility];  Service:  General;  Laterality: N/A;  . COLONOSCOPY  last one 03-28-2016  . CYSTOSCOPY W/ URETERAL STENT REMOVAL Right 06/26/2017   Procedure: CYSTOSCOPY WITH STENT REMOVAL;  Surgeon: Alexis Frock, MD;  [facility];  Service: Urology;  Laterality: Right;  . CYSTOSCOPY W/ URETERAL STENT REMOVAL Bilateral 10/22/2018   Procedure: CYSTOSCOPY WITH STENT REMOVAL;  Surgeon: Alexis Frock, MD;  [facility];  Service: Urology;  Laterality: Bilateral;  . CYSTOSCOPY WITH RETROGRADE PYELOGRAM, URETEROSCOPY AND STENT PLACEMENT Bilateral 06/18/2013   Procedure: CYSTOSCOPY WITH RETROGRADE PYELOGRAM, Left URETEROSCOPY with laser , AND bilateral STENT PLACEMENT;  Surgeon: Alexis Frock, MD;  [facility];  Service: Urology;  Laterality: Bilateral;  . CYSTOSCOPY WITH RETROGRADE PYELOGRAM, URETEROSCOPY AND STENT PLACEMENT Right 08/08/2013   Procedure: CYSTOSCOPY WITH RIGHT RETROGRADE PYELOGRAM, URETEROSCOPY AND STENT PLACEMENT, stone extraction;  Surgeon: Alexis Frock, MD;  [facility];  Service: Urology;  Laterality: Right;  . CYSTOSCOPY WITH RETROGRADE PYELOGRAM, URETEROSCOPY AND STENT PLACEMENT Right 08/10/2013   Procedure: 2ND STAGE CYSTOSCOPY WITH RETROGRADE PYELOGRAM, URETEROSCOPY AND STENT EXCHANGE;  Surgeon: Alexis Frock, MD;  [facility];  Service: Urology;  Laterality: Right;  . CYSTOSCOPY WITH RETROGRADE PYELOGRAM, URETEROSCOPY AND STENT PLACEMENT Bilateral 05/22/2017   Procedure: CYSTOSCOPY WITH RETROGRADE PYELOGRAM, URETEROSCOPY AND STENT PLACEMENT;  Surgeon: Alexis Frock, MD;  [facility];  Service: Urology;  Laterality: Bilateral;  . CYSTOSCOPY WITH RETROGRADE PYELOGRAM, URETEROSCOPY AND STENT PLACEMENT Bilateral 06/05/2017   Procedure: CYSTOSCOPY WITH RETROGRADE PYELOGRAM, URETEROSCOPY AND STENT PLACEMENT;  Surgeon: Alexis Frock, MD;  [facility];  Service: Urology;  Laterality: Bilateral;  . CYSTOSCOPY WITH RETROGRADE PYELOGRAM, URETEROSCOPY AND STENT PLACEMENT Bilateral  08/13/2018   Procedure: CYSTOSCOPY WITH RETROGRADE PYELOGRAM, URETEROSCOPY AND STENT PLACEMENT;  Surgeon: Alexis Frock, MD;  [facility];  Service: Urology;  Laterality: Bilateral;  90 MINS  . CYSTOSCOPY WITH RETROGRADE PYELOGRAM, URETEROSCOPY AND STENT PLACEMENT Bilateral 10/22/2018   Procedure: CYSTOSCOPY WITH RETROGRADE PYELOGRAM, URETEROSCOPY  STONE BASKETRY AND STENT PLACEMENT;  Surgeon: Alexis Frock, MD;  [facility];  Service: Urology;  Laterality: Bilateral;  . CYSTOSCOPY/URETEROSCOPY/HOLMIUM LASER/STENT PLACEMENT Left 06/26/2017   Procedure: CYSTOSCOPY/URETEROSCOPY third stage/HOLMIUM LASER/STENT PLACEMENT retrograde pylegram;  Surgeon: Alexis Frock, MD;  [facility];  Service: Urology;  Laterality: Left;  Marland Kitchen GASTRIC ROUX-EN-Y  2004    @[facility]   . HOLMIUM LASER APPLICATION Bilateral [id number]   Procedure: HOLMIUM LASER APPLICATION;  Surgeon: Alexis Frock, MD;  [facility];  Service: Urology;  Laterality: Bilateral;  . HOLMIUM LASER APPLICATION Left [id number]   Procedure: HOLMIUM LASER APPLICATION;  Surgeon: Alexis Frock, MD;  [facility];  Service: Urology;  Laterality: Left;  . HOLMIUM LASER APPLICATION Bilateral [id number]   Procedure: HOLMIUM LASER APPLICATION;  Surgeon: Alexis Frock, MD;  [facility];  Service: Urology;  Laterality: Bilateral;  . HOLMIUM LASER APPLICATION Bilateral [id number]   Procedure: HOLMIUM LASER APPLICATION;  Surgeon: Alexis Frock, MD;  [facility];  Service: Urology;  Laterality: Bilateral;  . HOLMIUM LASER APPLICATION Bilateral [id number]   Procedure: HOLMIUM LASER APPLICATION;  Surgeon: Alexis Frock, MD;  [facility];  Service: Urology;  Laterality: Bilateral;  . NEPHROLITHOTOMY Left 08/08/2013   Procedure: 1ST STAGE NEPHROLITHOTOMY PERCUTANEOUS WITH SURGEON ACCESS;  Surgeon: Alexis Frock, MD;  [facility];  Service: Urology;  Laterality: Left;  . NEPHROLITHOTOMY Left 08/10/2013   Procedure:  NEPHROLITHOTOMY PERCUTANEOUS SECOND LOOK/LEFT DIGITAL URETEROSCOPY/BASKETING OF STONE/EXCHANGE OF LEFT URETERAL STENT;  Surgeon: Alexis Frock, MD;  [facility];  Service: Urology;  Laterality: Left;  . ORCHIOPEXY  child   unlateral undescended testis  . PERCUTANEOUS NEPHROSTOLITHOTOMY Left 09-19-2009   dr dahlstedt  @[facility]   . TONSILLECTOMY  child  . TRACHEOSTOMY  1987   s/p tongue reduction and nasal septum deviation repair for  Sleep apnea   . URETEROSCOPY WITH HOLMIUM LASER LITHOTRIPSY Left 04-14-2002   dr dahlstedt  @[facility]   . WISDOM TOOTH EXTRACTION      There were no vitals filed for this visit.  Subjective Assessment - 02/01/19 0905    Subjective  COVID 19 screening performed on patient upon arrival. Patient reports "feeling it more in both knees today."    Pertinent History  HTN, tracheostomy,    Limitations  Standing;Walking;House hold activities    How long can you stand comfortably?  10-15 mins    How long can you walk comfortably?  15-20 minutes    Diagnostic tests  X-ray: bilateral knee OA    Patient Stated Goals  improve mobility and decrease pain    Currently in Pain?  Yes    Pain Score  4     Pain Location  Knee    Pain Orientation  Right;Left    Pain Descriptors / Indicators  Sore;Aching    Pain Type  Chronic pain  Pain Onset  More than a month ago    Pain Frequency  Constant         [facility] PT Assessment - 02/01/19 0001      Assessment   Medical Diagnosis  bilateral primary osteoarthritis of knee    Referring Provider (PT)  Sydnee Cabal, MD    Next MD Visit  02/16/2019    Prior Therapy  no      Precautions   Precautions  None      Restrictions   Weight Bearing Restrictions  No                   [facility] Adult PT Treatment/Exercise - 02/01/19 0001      Exercises   Exercises  Knee/Hip      Knee/Hip Exercises: Stretches   Passive Hamstring Stretch  Both;3 reps;30 seconds   seated     Knee/Hip Exercises: Aerobic   Nustep  L4, seat 12  x11 min      Knee/Hip Exercises: Standing   Hip Flexion  AROM;Both;2 sets;10 reps;Knee bent    Hip Abduction  AROM;Both;20 reps;Knee straight    Rocker Board  3 minutes      Knee/Hip Exercises: Seated   Long Arc Quad  Strengthening;Both;20 reps;Weights    Long Arc Quad Weight  3 lbs.    Clamshell with TheraBand  Green   x20   Hamstring Curl  Strengthening;Both;20 reps;Limitations    Hamstring Limitations  green theraband      Modalities   Modalities  Vasopneumatic;Electrical Stimulation      Electrical Stimulation   Electrical Stimulation Location  bilateral medial and lateral knee    Electrical Stimulation Action  pre-mod 2 channels    Electrical Stimulation Parameters  80-150 hz x10 mins    Electrical Stimulation Goals  Pain      Vasopneumatic   Number Minutes Vasopneumatic   10 minutes    Vasopnuematic Location   Knee   bilateral   Vasopneumatic Pressure  Low    Vasopneumatic Temperature   34                  PT Long Term Goals - 01/26/19 1858      PT LONG TERM GOAL #1   Title  Patient will be independent with HEP    Time  4    Period  Weeks    Status  New      PT LONG TERM GOAL #2   Title  Patient will demonstrate 115+ degrees of bilateral knee flexion AROM to improve ability to perform functional task    Time  4    Period  Weeks    Status  New      PT LONG TERM GOAL #3   Title  Patient will demonstrate 4+/5 or greater bilateral knee MMT in all planes to improve stability during functional tasks.    Time  4    Period  Weeks    Status  New      PT LONG TERM GOAL #4   Title  Patient will report ability to stand for 30 minutes or greater with bilateral knee pain less than or equal to 3/10 for meal preparation    Time  4    Period  Weeks    Status  New            Plan - 02/01/19 0946    Clinical Impression Statement  Patient responded well to therapy session with slight increase  of pain. Patient was able to demonstrate good form with all  exercises after explanation. E-stim initiated today with vasopneumatic device with no adverse affects.    Personal Factors and Comorbidities  Age;Comorbidity 1    Comorbidities  HTN, tracheostomy    Examination-Activity Limitations  Stairs;Stand    Examination-Participation Restrictions  Meal Prep    Stability/Clinical Decision Making  Stable/Uncomplicated    Clinical Decision Making  Low    Rehab Potential  Good    PT Frequency  2x / week    PT Duration  4 weeks    PT Treatment/Interventions  ADLs/Self Care Home Management;Moist Heat;Ultrasound;Electrical Stimulation;Cryotherapy;Gait training;Stair training;Functional mobility training;Therapeutic activities;Therapeutic exercise;Balance training;Neuromuscular re-education;Patient/family education;Passive range of motion;Manual techniques;Taping;Vasopneumatic Device    PT Next Visit Plan  nustep, gentle strengthening of LEs, modalities PRN for pain relief    Consulted and Agree with Plan of Care  Patient       Patient will benefit from skilled therapeutic intervention in order to improve the following deficits and impairments:  Decreased activity tolerance, Decreased strength, Decreased range of motion, Difficulty walking, Pain  Visit Diagnosis: Chronic pain of right knee  Chronic pain of left knee  Difficulty in walking, not elsewhere classified     Problem List Patient Active Problem List   Diagnosis Date Noted  . Bilateral chronic knee pain 03/01/2018  . Severe obesity (BMI >= 40) ([place]) 11/12/2013  . GERD (gastroesophageal reflux disease) 11/10/2013  . OSA (obstructive sleep apnea) 11/10/2013  . CKD (chronic kidney disease), stage III ([place]) 11/10/2013  . Hyperglycemia 11/10/2013  . Hypertension   . Hypercholesteremia     Gabriela Eves, PT, DPT 02/01/2019, 10:00 AM  [facility] [address] [place], [place], [address] Phone: [phone]   Fax:  [phone]  Name:  Dennis Spencer MRN: [id number] Date of Birth: [birth year]

## 2019-02-03 ENCOUNTER — Ambulatory Visit: Payer: Medicare HMO | Admitting: Physical Therapy

## 2019-02-03 ENCOUNTER — Other Ambulatory Visit: Payer: Self-pay

## 2019-02-03 DIAGNOSIS — M25562 Pain in left knee: Secondary | ICD-10-CM

## 2019-02-03 DIAGNOSIS — R262 Difficulty in walking, not elsewhere classified: Secondary | ICD-10-CM

## 2019-02-03 DIAGNOSIS — M25561 Pain in right knee: Secondary | ICD-10-CM | POA: Diagnosis not present

## 2019-02-03 DIAGNOSIS — G8929 Other chronic pain: Secondary | ICD-10-CM

## 2019-02-03 NOTE — Therapy (Signed)
[facility] [place], [place], [address] Phone: [phone]   Fax:  [phone]  Physical Therapy Treatment  Patient Details  Name: Dennis Spencer MRN: [id number] Date of Birth: 02-Jan-1946 Referring Provider (PT): Sydnee Cabal, MD   Encounter Date: 02/03/2019  PT End of Session - 02/03/19 0942    Visit Number  4    Number of Visits  8    Date for PT Re-Evaluation  03/09/19    Authorization Type  FOTO; progress note every 10th visit; KX modifier at 15th visit    PT Start Time  0900    PT Stop Time  0952    PT Time Calculation (min)  52 min    Activity Tolerance  Patient tolerated treatment well    Behavior During Therapy  [facility] for tasks assessed/performed       Past Medical History:  Diagnosis Date  . Anticoagulant long-term use    brilinta  . Arthritis   . Carotid stenosis, bilateral    per duplex 04-23-2018  bilateral ICA <50%  . Chronic cough    per pt this normal due to tracheostomy  . CKD (chronic kidney disease), stage III ([place]) 11/10/2013  . GERD (gastroesophageal reflux disease)   . History of CVA (cerebrovascular accident) 04/13/2018   chronic lacunar infarct--- per pt no residuals  . History of kidney stones    multiple kidney stones  . History of osteomyelitis age 33   LLE  . Hyperlipidemia   . Hypertension   . OSA (obstructive sleep apnea) study in epic 09-25-2005  severe osa   1981 s/p tracheostomy, tongue reduction and septoplasty;  LEAVES TRACH OPEN AT NIGHT FOR SLEEP APNEA  . Pneumonia   . 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 Renal calculi    bilateral  . Severe obesity (BMI >= 40) (HCC)   . Urgency of urination   . Wears glasses     Past Surgical History:  Procedure Laterality Date  . CHOLECYSTECTOMY N/A 06/15/2015   Procedure: LAPAROSCOPIC CHOLECYSTECTOMY;  Surgeon: Ralene Ok, MD;  [facility];  Service:  General;  Laterality: N/A;  . COLONOSCOPY  last one 03-28-2016  . CYSTOSCOPY W/ URETERAL STENT REMOVAL Right 06/26/2017   Procedure: CYSTOSCOPY WITH STENT REMOVAL;  Surgeon: Alexis Frock, MD;  [facility];  Service: Urology;  Laterality: Right;  . CYSTOSCOPY W/ URETERAL STENT REMOVAL Bilateral 10/22/2018   Procedure: CYSTOSCOPY WITH STENT REMOVAL;  Surgeon: Alexis Frock, MD;  [facility];  Service: Urology;  Laterality: Bilateral;  . CYSTOSCOPY WITH RETROGRADE PYELOGRAM, URETEROSCOPY AND STENT PLACEMENT Bilateral 06/18/2013   Procedure: CYSTOSCOPY WITH RETROGRADE PYELOGRAM, Left URETEROSCOPY with laser , AND bilateral STENT PLACEMENT;  Surgeon: Alexis Frock, MD;  [facility];  Service: Urology;  Laterality: Bilateral;  . CYSTOSCOPY WITH RETROGRADE PYELOGRAM, URETEROSCOPY AND STENT PLACEMENT Right 08/08/2013   Procedure: CYSTOSCOPY WITH RIGHT RETROGRADE PYELOGRAM, URETEROSCOPY AND STENT PLACEMENT, stone extraction;  Surgeon: Alexis Frock, MD;  [facility];  Service: Urology;  Laterality: Right;  . CYSTOSCOPY WITH RETROGRADE PYELOGRAM, URETEROSCOPY AND STENT PLACEMENT Right 08/10/2013   Procedure: 2ND STAGE CYSTOSCOPY WITH RETROGRADE PYELOGRAM, URETEROSCOPY AND STENT EXCHANGE;  Surgeon: Alexis Frock, MD;  [facility];  Service: Urology;  Laterality: Right;  . CYSTOSCOPY WITH RETROGRADE PYELOGRAM, URETEROSCOPY AND STENT PLACEMENT Bilateral 05/22/2017   Procedure: CYSTOSCOPY WITH RETROGRADE PYELOGRAM, URETEROSCOPY AND STENT PLACEMENT;  Surgeon: Alexis Frock, MD;  [facility];  Service: Urology;  Laterality: Bilateral;  . CYSTOSCOPY WITH RETROGRADE PYELOGRAM, URETEROSCOPY AND STENT PLACEMENT Bilateral 06/05/2017   Procedure: CYSTOSCOPY WITH RETROGRADE PYELOGRAM, URETEROSCOPY AND STENT PLACEMENT;  Surgeon: Alexis Frock, MD;  [facility];  Service: Urology;  Laterality: Bilateral;  . CYSTOSCOPY WITH RETROGRADE PYELOGRAM, URETEROSCOPY AND STENT PLACEMENT Bilateral  08/13/2018   Procedure: CYSTOSCOPY WITH RETROGRADE PYELOGRAM, URETEROSCOPY AND STENT PLACEMENT;  Surgeon: Alexis Frock, MD;  [facility];  Service: Urology;  Laterality: Bilateral;  90 MINS  . CYSTOSCOPY WITH RETROGRADE PYELOGRAM, URETEROSCOPY AND STENT PLACEMENT Bilateral 10/22/2018   Procedure: CYSTOSCOPY WITH RETROGRADE PYELOGRAM, URETEROSCOPY  STONE BASKETRY AND STENT PLACEMENT;  Surgeon: Alexis Frock, MD;  [facility];  Service: Urology;  Laterality: Bilateral;  . CYSTOSCOPY/URETEROSCOPY/HOLMIUM LASER/STENT PLACEMENT Left 06/26/2017   Procedure: CYSTOSCOPY/URETEROSCOPY third stage/HOLMIUM LASER/STENT PLACEMENT retrograde pylegram;  Surgeon: Alexis Frock, MD;  [facility];  Service: Urology;  Laterality: Left;  Marland Kitchen GASTRIC ROUX-EN-Y  2004    _0   . HOLMIUM LASER APPLICATION Bilateral 10/11/7033   Procedure: HOLMIUM LASER APPLICATION;  Surgeon: Alexis Frock, MD;  [facility];  Service: Urology;  Laterality: Bilateral;  . HOLMIUM LASER APPLICATION Left 0/0/9381   Procedure: HOLMIUM LASER APPLICATION;  Surgeon: Alexis Frock, MD;  [facility];  Service: Urology;  Laterality: Left;  . HOLMIUM LASER APPLICATION Bilateral 01/07/9370   Procedure: HOLMIUM LASER APPLICATION;  Surgeon: Alexis Frock, MD;  [facility];  Service: Urology;  Laterality: Bilateral;  . HOLMIUM LASER APPLICATION Bilateral 10/19/6787   Procedure: HOLMIUM LASER APPLICATION;  Surgeon: Alexis Frock, MD;  [facility];  Service: Urology;  Laterality: Bilateral;  . HOLMIUM LASER APPLICATION Bilateral 3/81/0175   Procedure: HOLMIUM LASER APPLICATION;  Surgeon: Alexis Frock, MD;  [facility];  Service: Urology;  Laterality: Bilateral;  . NEPHROLITHOTOMY Left 08/08/2013   Procedure: 1ST STAGE NEPHROLITHOTOMY PERCUTANEOUS WITH SURGEON ACCESS;  Surgeon: Alexis Frock, MD;  [facility];  Service: Urology;  Laterality: Left;  . NEPHROLITHOTOMY Left 08/10/2013   Procedure:  NEPHROLITHOTOMY PERCUTANEOUS SECOND LOOK/LEFT DIGITAL URETEROSCOPY/BASKETING OF STONE/EXCHANGE OF LEFT URETERAL STENT;  Surgeon: Alexis Frock, MD;  [facility];  Service: Urology;  Laterality: Left;  . ORCHIOPEXY  child   unlateral undescended testis  . PERCUTANEOUS NEPHROSTOLITHOTOMY Left 09-19-2009   dr dahlstedt  _1   . TONSILLECTOMY  child  . TRACHEOSTOMY  1987   s/p tongue reduction and nasal septum deviation repair for  Sleep apnea   . URETEROSCOPY WITH HOLMIUM LASER LITHOTRIPSY Left 04-14-2002   dr dahlstedt  _2   . WISDOM TOOTH EXTRACTION      There were no vitals filed for this visit.  Subjective Assessment - 02/03/19 0913    Subjective  COVID 19 screening performed on patient upon arrival. Patient reports doing well    Pertinent History  HTN, tracheostomy,    Limitations  Standing;Walking;House hold activities    How long can you stand comfortably?  10-15 mins    How long can you walk comfortably?  15-20 minutes    Diagnostic tests  X-ray: bilateral knee OA    Patient Stated Goals  improve mobility and decrease pain    Currently in Pain?  Yes    Pain Score  4     Pain Location  Knee    Pain Orientation  Right;Left    Pain Type  Chronic pain    Pain Onset  More than a month ago  Pain Frequency  Constant    Aggravating Factors   standing walking    Pain Relieving Factors  movement and sitting                       [facility] Adult PT Treatment/Exercise - 02/03/19 0001      Knee/Hip Exercises: Aerobic   Nustep  L4, seat 12 x11 min      Knee/Hip Exercises: Standing   Terminal Knee Extension  Strengthening;Both;20 reps;Theraband    Theraband Level (Terminal Knee Extension)  Level 2 (Red)    Rocker Board  3 minutes      Knee/Hip Exercises: Seated   Long Arc Quad  Strengthening;Both;20 reps;Weights    Long Arc Quad Weight  3 lbs.      Knee/Hip Exercises: Supine   Straight Leg Raises  Strengthening;Both;2 sets;10 reps    Other Supine Knee/Hip  Exercises  clam shell w green t-band x30      Electrical Stimulation   Electrical Stimulation Location  bilateral medial and lateral knee    Electrical Stimulation Action  premod    Electrical Stimulation Parameters  80-_0  x65mn    Electrical Stimulation Goals  Pain      Vasopneumatic   Number Minutes Vasopneumatic   10 minutes    Vasopnuematic Location   Knee    Vasopneumatic Pressure  Low    Vasopneumatic Temperature   34                  PT Long Term Goals - 02/03/19 0943      PT LONG TERM GOAL #1   Title  Patient will be independent with HEP    Time  4    Period  Weeks    Status  Achieved   Doing well with daily HEP given 02/03/19     PT LONG TERM GOAL #2   Title  Patient will demonstrate 115+ degrees of bilateral knee flexion AROM to improve ability to perform functional task    Time  4    Period  Weeks    Status  On-going      PT LONG TERM GOAL #3   Title  Patient will demonstrate 4+/5 or greater bilateral knee MMT in all planes to improve stability during functional tasks.    Time  4    Period  Weeks    Status  On-going      PT LONG TERM GOAL #4   Title  Patient will report ability to stand for 30 minutes or greater with bilateral knee pain less than or equal to 3/10 for meal preparation    Time  4    Period  Weeks    Status  On-going   4+/10 and up to 7/10 02/03/19           Plan - 02/03/19 0945    Clinical Impression Statement  Patient tolerated treatment well today. Patient able to progress with exercises today with no increased discomfort. Patient doing HEP daily. Patient has limitations with walking or standing. Patient met LTG#1 with others ongoing.    Personal Factors and Comorbidities  Age;Comorbidity 1    Comorbidities  HTN, tracheostomy    Examination-Activity Limitations  Stairs;Stand    Examination-Participation Restrictions  Meal Prep    Stability/Clinical Decision Making  Stable/Uncomplicated    Rehab Potential  Good    PT  Frequency  2x / week    PT Duration  4 weeks    PT  Treatment/Interventions  ADLs/Self Care Home Management;Moist Heat;Ultrasound;Electrical Stimulation;Cryotherapy;Gait training;Stair training;Functional mobility training;Therapeutic activities;Therapeutic exercise;Balance training;Neuromuscular re-education;Patient/family education;Passive range of motion;Manual techniques;Taping;Vasopneumatic Device    PT Next Visit Plan  nustep, gentle strengthening of LEs, modalities PRN for pain relief    Consulted and Agree with Plan of Care  Patient       Patient will benefit from skilled therapeutic intervention in order to improve the following deficits and impairments:  Decreased activity tolerance, Decreased strength, Decreased range of motion, Difficulty walking, Pain  Visit Diagnosis: Chronic pain of right knee  Chronic pain of left knee  Difficulty in walking, not elsewhere classified     Problem List Patient Active Problem List   Diagnosis Date Noted  . Bilateral chronic knee pain 03/01/2018  . Severe obesity (BMI >= 40) ([place]) 11/12/2013  . GERD (gastroesophageal reflux disease) 11/10/2013  . OSA (obstructive sleep apnea) 11/10/2013  . CKD (chronic kidney disease), stage III ([place]) 11/10/2013  . Hyperglycemia 11/10/2013  . Hypertension   . Hypercholesteremia     DUNFORD, CHRISTINA P, PTA 02/03/2019, 10:00 AM  [facility] [place], [place], [address] Phone: [phone]   Fax:  [phone]  Name: Dennis Spencer MRN: [id number] Date of Birth: 1945-09-11

## 2019-02-08 ENCOUNTER — Other Ambulatory Visit: Payer: Self-pay

## 2019-02-08 ENCOUNTER — Encounter: Payer: Self-pay | Admitting: Physical Therapy

## 2019-02-08 ENCOUNTER — Ambulatory Visit: Payer: Medicare HMO | Admitting: Physical Therapy

## 2019-02-08 DIAGNOSIS — R262 Difficulty in walking, not elsewhere classified: Secondary | ICD-10-CM

## 2019-02-08 DIAGNOSIS — G8929 Other chronic pain: Secondary | ICD-10-CM

## 2019-02-08 DIAGNOSIS — M25562 Pain in left knee: Secondary | ICD-10-CM | POA: Diagnosis not present

## 2019-02-08 DIAGNOSIS — M25561 Pain in right knee: Secondary | ICD-10-CM | POA: Diagnosis not present

## 2019-02-08 NOTE — Therapy (Signed)
[facility] [place], [place], [address] Phone: [phone]   Fax:  [phone]  Physical Therapy Treatment  Patient Details  Name: Dennis Spencer MRN: [id number] Date of Birth: [birth year] Referring Provider (PT): Sydnee Cabal, MD   Encounter Date: 02/08/2019  PT End of Session - 02/08/19 0942    Visit Number  5    Number of Visits  8    Date for PT Re-Evaluation  03/09/19    Authorization Type  FOTO; progress note every 10th visit; KX modifier at 15th visit    PT Start Time  0902    PT Stop Time  0951    PT Time Calculation (min)  49 min    Activity Tolerance  Patient tolerated treatment well    Behavior During Therapy  [facility] for tasks assessed/performed       Past Medical History:  Diagnosis Date  . Anticoagulant long-term use    brilinta  . Arthritis   . Carotid stenosis, bilateral    per duplex 04-23-2018  bilateral ICA <50%  . Chronic cough    per pt this normal due to tracheostomy  . CKD (chronic kidney disease), stage III ([place]) 11/10/2013  . GERD (gastroesophageal reflux disease)   . History of CVA (cerebrovascular accident) 04/13/2018   chronic lacunar infarct--- per pt no residuals  . History of kidney stones    multiple kidney stones  . History of osteomyelitis age 87   LLE  . Hyperlipidemia   . Hypertension   . OSA (obstructive sleep apnea) study in epic 09-25-2005  severe osa   1981 s/p tracheostomy, tongue reduction and septoplasty;  LEAVES TRACH OPEN AT NIGHT FOR SLEEP APNEA  . Pneumonia   . 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 Renal calculi    bilateral  . Severe obesity (BMI >= 40) (HCC)   . Urgency of urination   . Wears glasses     Past Surgical History:  Procedure Laterality Date  . CHOLECYSTECTOMY N/A 06/15/2015   Procedure: LAPAROSCOPIC CHOLECYSTECTOMY;  Surgeon: Ralene Ok, MD;  [facility];  Service:  General;  Laterality: N/A;  . COLONOSCOPY  last one 03-28-2016  . CYSTOSCOPY W/ URETERAL STENT REMOVAL Right 06/26/2017   Procedure: CYSTOSCOPY WITH STENT REMOVAL;  Surgeon: Alexis Frock, MD;  [facility];  Service: Urology;  Laterality: Right;  . CYSTOSCOPY W/ URETERAL STENT REMOVAL Bilateral 10/22/2018   Procedure: CYSTOSCOPY WITH STENT REMOVAL;  Surgeon: Alexis Frock, MD;  [facility];  Service: Urology;  Laterality: Bilateral;  . CYSTOSCOPY WITH RETROGRADE PYELOGRAM, URETEROSCOPY AND STENT PLACEMENT Bilateral 06/18/2013   Procedure: CYSTOSCOPY WITH RETROGRADE PYELOGRAM, Left URETEROSCOPY with laser , AND bilateral STENT PLACEMENT;  Surgeon: Alexis Frock, MD;  [facility];  Service: Urology;  Laterality: Bilateral;  . CYSTOSCOPY WITH RETROGRADE PYELOGRAM, URETEROSCOPY AND STENT PLACEMENT Right 08/08/2013   Procedure: CYSTOSCOPY WITH RIGHT RETROGRADE PYELOGRAM, URETEROSCOPY AND STENT PLACEMENT, stone extraction;  Surgeon: Alexis Frock, MD;  [facility];  Service: Urology;  Laterality: Right;  . CYSTOSCOPY WITH RETROGRADE PYELOGRAM, URETEROSCOPY AND STENT PLACEMENT Right 08/10/2013   Procedure: 2ND STAGE CYSTOSCOPY WITH RETROGRADE PYELOGRAM, URETEROSCOPY AND STENT EXCHANGE;  Surgeon: Alexis Frock, MD;  [facility];  Service: Urology;  Laterality: Right;  . CYSTOSCOPY WITH RETROGRADE PYELOGRAM, URETEROSCOPY AND STENT PLACEMENT Bilateral 05/22/2017   Procedure: CYSTOSCOPY WITH RETROGRADE PYELOGRAM, URETEROSCOPY AND STENT PLACEMENT;  Surgeon: Alexis Frock, MD;  [facility];  Service: Urology;  Laterality: Bilateral;  . CYSTOSCOPY WITH RETROGRADE PYELOGRAM, URETEROSCOPY AND STENT PLACEMENT Bilateral 06/05/2017   Procedure: CYSTOSCOPY WITH RETROGRADE PYELOGRAM, URETEROSCOPY AND STENT PLACEMENT;  Surgeon: Alexis Frock, MD;  [facility];  Service: Urology;  Laterality: Bilateral;  . CYSTOSCOPY WITH RETROGRADE PYELOGRAM, URETEROSCOPY AND STENT PLACEMENT Bilateral  08/13/2018   Procedure: CYSTOSCOPY WITH RETROGRADE PYELOGRAM, URETEROSCOPY AND STENT PLACEMENT;  Surgeon: Alexis Frock, MD;  [facility];  Service: Urology;  Laterality: Bilateral;  90 MINS  . CYSTOSCOPY WITH RETROGRADE PYELOGRAM, URETEROSCOPY AND STENT PLACEMENT Bilateral 10/22/2018   Procedure: CYSTOSCOPY WITH RETROGRADE PYELOGRAM, URETEROSCOPY  STONE BASKETRY AND STENT PLACEMENT;  Surgeon: Alexis Frock, MD;  [facility];  Service: Urology;  Laterality: Bilateral;  . CYSTOSCOPY/URETEROSCOPY/HOLMIUM LASER/STENT PLACEMENT Left 06/26/2017   Procedure: CYSTOSCOPY/URETEROSCOPY third stage/HOLMIUM LASER/STENT PLACEMENT retrograde pylegram;  Surgeon: Alexis Frock, MD;  [facility];  Service: Urology;  Laterality: Left;  Marland Kitchen GASTRIC ROUX-EN-Y  2004    @[facility]   . HOLMIUM LASER APPLICATION Bilateral [id number]   Procedure: HOLMIUM LASER APPLICATION;  Surgeon: Alexis Frock, MD;  [facility];  Service: Urology;  Laterality: Bilateral;  . HOLMIUM LASER APPLICATION Left [id number]   Procedure: HOLMIUM LASER APPLICATION;  Surgeon: Alexis Frock, MD;  [facility];  Service: Urology;  Laterality: Left;  . HOLMIUM LASER APPLICATION Bilateral [id number]   Procedure: HOLMIUM LASER APPLICATION;  Surgeon: Alexis Frock, MD;  [facility];  Service: Urology;  Laterality: Bilateral;  . HOLMIUM LASER APPLICATION Bilateral [id number]   Procedure: HOLMIUM LASER APPLICATION;  Surgeon: Alexis Frock, MD;  [facility];  Service: Urology;  Laterality: Bilateral;  . HOLMIUM LASER APPLICATION Bilateral [id number]   Procedure: HOLMIUM LASER APPLICATION;  Surgeon: Alexis Frock, MD;  [facility];  Service: Urology;  Laterality: Bilateral;  . NEPHROLITHOTOMY Left 08/08/2013   Procedure: 1ST STAGE NEPHROLITHOTOMY PERCUTANEOUS WITH SURGEON ACCESS;  Surgeon: Alexis Frock, MD;  [facility];  Service: Urology;  Laterality: Left;  . NEPHROLITHOTOMY Left 08/10/2013   Procedure:  NEPHROLITHOTOMY PERCUTANEOUS SECOND LOOK/LEFT DIGITAL URETEROSCOPY/BASKETING OF STONE/EXCHANGE OF LEFT URETERAL STENT;  Surgeon: Alexis Frock, MD;  [facility];  Service: Urology;  Laterality: Left;  . ORCHIOPEXY  child   unlateral undescended testis  . PERCUTANEOUS NEPHROSTOLITHOTOMY Left 09-19-2009   dr dahlstedt  @[facility]   . TONSILLECTOMY  child  . TRACHEOSTOMY  1987   s/p tongue reduction and nasal septum deviation repair for  Sleep apnea   . URETEROSCOPY WITH HOLMIUM LASER LITHOTRIPSY Left 04-14-2002   dr Diona Fanti  @[facility]   . WISDOM TOOTH EXTRACTION      There were no vitals filed for this visit.  Subjective Assessment - 02/08/19 0939    Subjective  COVID 19 screening performed on patient upon arrival. Patient reports that he has had greater L knee pain since last week. Reports that he has also had more pain in posterior knees as well.    Pertinent History  HTN, tracheostomy,    Limitations  Standing;Walking;House hold activities    How long can you stand comfortably?  10-15 mins    How long can you walk comfortably?  15-20 minutes    Diagnostic tests  X-ray: bilateral knee OA    Patient Stated Goals  improve mobility and decrease pain    Currently in Pain?  Yes    Pain Score  6     Pain Location  Knee    Pain Orientation  Left;Right  Pain Descriptors / Indicators  Sore;Discomfort;Sharp    Pain Type  Chronic pain    Pain Onset  More than a month ago    Pain Frequency  Constant         [facility] PT Assessment - 02/08/19 0001      Assessment   Medical Diagnosis  bilateral primary osteoarthritis of knee    Referring Provider (PT)  Sydnee Cabal, MD    Next MD Visit  02/16/2019   for injections   Prior Therapy  no      Precautions   Precautions  None      Restrictions   Weight Bearing Restrictions  No                   [facility] Adult PT Treatment/Exercise - 02/08/19 0001      Knee/Hip Exercises: Aerobic   Nustep  L4 x15 min      Knee/Hip Exercises:  Standing   Forward Step Up  Right;15 reps;Left;10 reps;Hand Hold: 2;Step Height: 6"   LLE limited by pain   Rocker Board  2 minutes      Knee/Hip Exercises: Seated   Long Arc Quad  Strengthening;Both;20 reps;Weights    Long Arc Quad Weight  4 lbs.    Long [employer] Limitations  with ball squeeze    Hamstring Curl  Strengthening;Both;2 sets;10 reps    Hamstring Limitations  green theraband      Modalities   Modalities  Vasopneumatic;Electrical Aeronautical engineer Location  B knee    Electrical Stimulation Action  Pre-Mod    Electrical Stimulation Parameters  80-150 hz x15 min    Electrical Stimulation Goals  Pain      Vasopneumatic   Number Minutes Vasopneumatic   15 minutes    Vasopnuematic Location   Knee    Vasopneumatic Pressure  Low    Vasopneumatic Temperature   34                  PT Long Term Goals - 02/03/19 0943      PT LONG TERM GOAL #1   Title  Patient will be independent with HEP    Time  4    Period  Weeks    Status  Achieved   Doing well with daily HEP given 02/03/19     PT LONG TERM GOAL #2   Title  Patient will demonstrate 115+ degrees of bilateral knee flexion AROM to improve ability to perform functional task    Time  4    Period  Weeks    Status  On-going      PT LONG TERM GOAL #3   Title  Patient will demonstrate 4+/5 or greater bilateral knee MMT in all planes to improve stability during functional tasks.    Time  4    Period  Weeks    Status  On-going      PT LONG TERM GOAL #4   Title  Patient will report ability to stand for 30 minutes or greater with bilateral knee pain less than or equal to 3/10 for meal preparation    Time  4    Period  Weeks    Status  On-going   4+/10 and up to 7/10 02/03/19           Plan - 02/08/19 0943    Clinical Impression Statement  Patient presented in clinic with reports of B knee pain with  L knee > R knee since last week. Patient still limited  with prolonged standing or walking due to pain. LLE limited with forward step ups secondary to pain and demonstrated less eccentric control. Greater difficulty reported by patient with LAQ/ball squeeze with reports of discomfort in B glute/posterior hips. No abnormal inflammation or discomfort reported by patient during palpation of popliteal fossa. Normal modalities response noted following removal of the modalities. Patient to began B knee injections next week.    Personal Factors and Comorbidities  Age;Comorbidity 1    Comorbidities  HTN, tracheostomy    Examination-Activity Limitations  Stairs;Stand    Examination-Participation Restrictions  Meal Prep    Stability/Clinical Decision Making  Stable/Uncomplicated    Rehab Potential  Good    PT Frequency  2x / week    PT Duration  4 weeks    PT Treatment/Interventions  ADLs/Self Care Home Management;Moist Heat;Ultrasound;Electrical Stimulation;Cryotherapy;Gait training;Stair training;Functional mobility training;Therapeutic activities;Therapeutic exercise;Balance training;Neuromuscular re-education;Patient/family education;Passive range of motion;Manual techniques;Taping;Vasopneumatic Device    PT Next Visit Plan  nustep, gentle strengthening of LEs, modalities PRN for pain relief    PT Home Exercise Plan  see patient education section    Consulted and Agree with Plan of Care  Patient       Patient will benefit from skilled therapeutic intervention in order to improve the following deficits and impairments:  Decreased activity tolerance, Decreased strength, Decreased range of motion, Difficulty walking, Pain  Visit Diagnosis: Chronic pain of right knee  Chronic pain of left knee  Difficulty in walking, not elsewhere classified     Problem List Patient Active Problem List   Diagnosis Date Noted  . Bilateral chronic knee pain 03/01/2018  . Severe obesity (BMI >= 40) ([place]) 11/12/2013  . GERD (gastroesophageal reflux disease) 11/10/2013   . OSA (obstructive sleep apnea) 11/10/2013  . CKD (chronic kidney disease), stage III ([place]) 11/10/2013  . Hyperglycemia 11/10/2013  . Hypertension   . Hypercholesteremia     Standley Brooking, PTA 02/08/2019, 9:59 AM  [facility] [address] [place], [place], [address] Phone: [phone]   Fax:  [phone]  Name: Dennis Spencer MRN: [id number] Date of Birth: [birth year]

## 2019-02-10 ENCOUNTER — Other Ambulatory Visit: Payer: Self-pay

## 2019-02-10 ENCOUNTER — Ambulatory Visit: Payer: Medicare HMO | Attending: Specialist | Admitting: Physical Therapy

## 2019-02-10 DIAGNOSIS — R262 Difficulty in walking, not elsewhere classified: Secondary | ICD-10-CM | POA: Insufficient documentation

## 2019-02-10 DIAGNOSIS — M25561 Pain in right knee: Secondary | ICD-10-CM | POA: Insufficient documentation

## 2019-02-10 DIAGNOSIS — G8929 Other chronic pain: Secondary | ICD-10-CM | POA: Diagnosis not present

## 2019-02-10 DIAGNOSIS — M25562 Pain in left knee: Secondary | ICD-10-CM | POA: Diagnosis not present

## 2019-02-10 NOTE — Therapy (Signed)
[facility] [place], [place], [address] Phone: [phone]   Fax:  [phone]  Physical Therapy Treatment  Patient Details  Name: Dennis Spencer MRN: [id number] Date of Birth: 1945-06-16 Referring Provider (PT): Sydnee Cabal, MD   Encounter Date: 02/10/2019  PT End of Session - 02/10/19 0949    Visit Number  6    Number of Visits  8    Date for PT Re-Evaluation  03/09/19    Authorization Type  FOTO; progress note every 10th visit; KX modifier at 15th visit    PT Start Time  0900    PT Stop Time  0957    PT Time Calculation (min)  57 min    Activity Tolerance  Patient tolerated treatment well    Behavior During Therapy  [facility] for tasks assessed/performed       Past Medical History:  Diagnosis Date  . Anticoagulant long-term use    brilinta  . Arthritis   . Carotid stenosis, bilateral    per duplex 04-23-2018  bilateral ICA <50%  . Chronic cough    per pt this normal due to tracheostomy  . CKD (chronic kidney disease), stage III ([place]) 11/10/2013  . GERD (gastroesophageal reflux disease)   . History of CVA (cerebrovascular accident) 04/13/2018   chronic lacunar infarct--- per pt no residuals  . History of kidney stones    multiple kidney stones  . History of osteomyelitis age 36   LLE  . Hyperlipidemia   . Hypertension   . OSA (obstructive sleep apnea) study in epic 09-25-2005  severe osa   1981 s/p tracheostomy, tongue reduction and septoplasty;  LEAVES TRACH OPEN AT NIGHT FOR SLEEP APNEA  . Pneumonia   . 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 Renal calculi    bilateral  . Severe obesity (BMI >= 40) (H[facility])   . Urgency of urination   . Wears glasses     Past Surgical History:  Procedure Laterality Date  . CHOLECYSTECTOMY N/A 06/15/2015   Procedure: LAPAROSCOPIC CHOLECYSTECTOMY;  Surgeon: Ralene Ok, MD;  [facility];  Service:  General;  Laterality: N/A;  . COLONOSCOPY  last one 03-28-2016  . CYSTOSCOPY W/ URETERAL STENT REMOVAL Right 06/26/2017   Procedure: CYSTOSCOPY WITH STENT REMOVAL;  Surgeon: Alexis Frock, MD;  [facility];  Service: Urology;  Laterality: Right;  . CYSTOSCOPY W/ URETERAL STENT REMOVAL Bilateral 10/22/2018   Procedure: CYSTOSCOPY WITH STENT REMOVAL;  Surgeon: Alexis Frock, MD;  [facility];  Service: Urology;  Laterality: Bilateral;  . CYSTOSCOPY WITH RETROGRADE PYELOGRAM, URETEROSCOPY AND STENT PLACEMENT Bilateral 06/18/2013   Procedure: CYSTOSCOPY WITH RETROGRADE PYELOGRAM, Left URETEROSCOPY with laser , AND bilateral STENT PLACEMENT;  Surgeon: Alexis Frock, MD;  [facility];  Service: Urology;  Laterality: Bilateral;  . CYSTOSCOPY WITH RETROGRADE PYELOGRAM, URETEROSCOPY AND STENT PLACEMENT Right 08/08/2013   Procedure: CYSTOSCOPY WITH RIGHT RETROGRADE PYELOGRAM, URETEROSCOPY AND STENT PLACEMENT, stone extraction;  Surgeon: Alexis Frock, MD;  [facility];  Service: Urology;  Laterality: Right;  . CYSTOSCOPY WITH RETROGRADE PYELOGRAM, URETEROSCOPY AND STENT PLACEMENT Right 08/10/2013   Procedure: 2ND STAGE CYSTOSCOPY WITH RETROGRADE PYELOGRAM, URETEROSCOPY AND STENT EXCHANGE;  Surgeon: Alexis Frock, MD;  [facility];  Service: Urology;  Laterality: Right;  . CYSTOSCOPY WITH RETROGRADE PYELOGRAM, URETEROSCOPY AND STENT PLACEMENT Bilateral 05/22/2017   Procedure: CYSTOSCOPY WITH RETROGRADE PYELOGRAM, URETEROSCOPY AND STENT PLACEMENT;  Surgeon: Alexis Frock, MD;  [facility];  Service: Urology;  Laterality: Bilateral;  . CYSTOSCOPY WITH RETROGRADE PYELOGRAM, URETEROSCOPY AND STENT PLACEMENT Bilateral 06/05/2017   Procedure: CYSTOSCOPY WITH RETROGRADE PYELOGRAM, URETEROSCOPY AND STENT PLACEMENT;  Surgeon: Alexis Frock, MD;  [facility];  Service: Urology;  Laterality: Bilateral;  . CYSTOSCOPY WITH RETROGRADE PYELOGRAM, URETEROSCOPY AND STENT PLACEMENT Bilateral  08/13/2018   Procedure: CYSTOSCOPY WITH RETROGRADE PYELOGRAM, URETEROSCOPY AND STENT PLACEMENT;  Surgeon: Alexis Frock, MD;  [facility];  Service: Urology;  Laterality: Bilateral;  90 MINS  . CYSTOSCOPY WITH RETROGRADE PYELOGRAM, URETEROSCOPY AND STENT PLACEMENT Bilateral 10/22/2018   Procedure: CYSTOSCOPY WITH RETROGRADE PYELOGRAM, URETEROSCOPY  STONE BASKETRY AND STENT PLACEMENT;  Surgeon: Alexis Frock, MD;  [facility];  Service: Urology;  Laterality: Bilateral;  . CYSTOSCOPY/URETEROSCOPY/HOLMIUM LASER/STENT PLACEMENT Left 06/26/2017   Procedure: CYSTOSCOPY/URETEROSCOPY third stage/HOLMIUM LASER/STENT PLACEMENT retrograde pylegram;  Surgeon: Alexis Frock, MD;  [facility];  Service: Urology;  Laterality: Left;  Marland Kitchen GASTRIC ROUX-EN-Y  2004    _0   . HOLMIUM LASER APPLICATION Bilateral 0/10/3014   Procedure: HOLMIUM LASER APPLICATION;  Surgeon: Alexis Frock, MD;  [facility];  Service: Urology;  Laterality: Bilateral;  . HOLMIUM LASER APPLICATION Left 0/05/930   Procedure: HOLMIUM LASER APPLICATION;  Surgeon: Alexis Frock, MD;  [facility];  Service: Urology;  Laterality: Left;  . HOLMIUM LASER APPLICATION Bilateral 3/55/7322   Procedure: HOLMIUM LASER APPLICATION;  Surgeon: Alexis Frock, MD;  [facility];  Service: Urology;  Laterality: Bilateral;  . HOLMIUM LASER APPLICATION Bilateral 0/06/5425   Procedure: HOLMIUM LASER APPLICATION;  Surgeon: Alexis Frock, MD;  [facility];  Service: Urology;  Laterality: Bilateral;  . HOLMIUM LASER APPLICATION Bilateral 0/62/3762   Procedure: HOLMIUM LASER APPLICATION;  Surgeon: Alexis Frock, MD;  [facility];  Service: Urology;  Laterality: Bilateral;  . NEPHROLITHOTOMY Left 08/08/2013   Procedure: 1ST STAGE NEPHROLITHOTOMY PERCUTANEOUS WITH SURGEON ACCESS;  Surgeon: Alexis Frock, MD;  [facility];  Service: Urology;  Laterality: Left;  . NEPHROLITHOTOMY Left 08/10/2013   Procedure:  NEPHROLITHOTOMY PERCUTANEOUS SECOND LOOK/LEFT DIGITAL URETEROSCOPY/BASKETING OF STONE/EXCHANGE OF LEFT URETERAL STENT;  Surgeon: Alexis Frock, MD;  [facility];  Service: Urology;  Laterality: Left;  . ORCHIOPEXY  child   unlateral undescended testis  . PERCUTANEOUS NEPHROSTOLITHOTOMY Left 09-19-2009   dr dahlstedt  _1   . TONSILLECTOMY  child  . TRACHEOSTOMY  1987   s/p tongue reduction and nasal septum deviation repair for  Sleep apnea   . URETEROSCOPY WITH HOLMIUM LASER LITHOTRIPSY Left 04-14-2002   dr dahlstedt  _2   . WISDOM TOOTH EXTRACTION      There were no vitals filed for this visit.  Subjective Assessment - 02/10/19 0906    Subjective  COVID 19 screening performed on patient upon arrival. Patient reports ongoing discomfort today    Pertinent History  HTN, tracheostomy,    Limitations  Standing;Walking;House hold activities    How long can you stand comfortably?  10-15 mins    How long can you walk comfortably?  15-20 minutes    Diagnostic tests  X-ray: bilateral knee OA    Patient Stated Goals  improve mobility and decrease pain    Currently in Pain?  Yes    Pain Score  6     Pain Location  Knee    Pain Orientation  Right;Left    Pain Descriptors / Indicators  Discomfort;Sore    Pain Type  Chronic pain    Pain  Onset  More than a month ago    Pain Frequency  Constant    Aggravating Factors   prolong standing and prolong walking    Pain Relieving Factors  at rest         [facility] PT Assessment - 02/10/19 0001      AROM   AROM Assessment Site  Knee    Right/Left Knee  Right;Left    Right Knee Flexion  113    Left Knee Flexion  115                   [facility] Adult PT Treatment/Exercise - 02/10/19 0001      Knee/Hip Exercises: Aerobic   Nustep  L4 x15 min UE/LE activity, monitored      Knee/Hip Exercises: Standing   Heel Raises  Both;2 sets;10 reps    Forward Step Up  Both;2 sets;10 reps;Step Height: 4";Hand Hold: 2    Rocker Board  2 minutes       Knee/Hip Exercises: Seated   Long Arc Quad  Strengthening;Both;20 reps;Weights    Long Arc Quad Weight  4 lbs.    Long [employer] Limitations  with ball squeeze    Hamstring Curl  Strengthening;Both;2 sets;10 reps    Hamstring Limitations  green theraband      Knee/Hip Exercises: Supine   Straight Leg Raises  Strengthening;Both;2 sets;10 reps    Other Supine Knee/Hip Exercises  clam shell w green t-band x30      Electrical Stimulation   Electrical Stimulation Location  B knee    Electrical Stimulation Action  premod    Electrical Stimulation Parameters  80-_0  x95mn    Electrical Stimulation Goals  Pain      Vasopneumatic   Number Minutes Vasopneumatic   15 minutes    Vasopnuematic Location   Knee    Vasopneumatic Pressure  Low                  PT Long Term Goals - 02/10/19 0908      PT LONG TERM GOAL #1   Title  Patient will be independent with HEP    Time  4    Period  Weeks    Status  Achieved      PT LONG TERM GOAL #2   Title  Patient will demonstrate 115+ degrees of bilateral knee flexion AROM to improve ability to perform functional task    Baseline  AROM 115 LT / AROM 113 RT 02/10/19    Time  4    Period  Weeks    Status  Partially Met      PT LONG TERM GOAL #3   Title  Patient will demonstrate 4+/5 or greater bilateral knee MMT in all planes to improve stability during functional tasks.    Time  4    Period  Weeks    Status  On-going      PT LONG TERM GOAL #4   Title  Patient will report ability to stand for 30 minutes or greater with bilateral knee pain less than or equal to 3/10 for meal preparation    Time  4    Period  Weeks    Status  On-going   6-7/10 at 30 min of standing activity 02/10/19           Plan - 02/10/19 0954    Clinical Impression Statement  Patient tolerated treatment well today with some soreness with standing activity. Patient progressing with improved endurance  with exercises, improved AROM in bil knees. Patient  has ongoing limitations with 6-7/10 pain with prolong standing. Goals progressing.    Personal Factors and Comorbidities  Age;Comorbidity 1    Comorbidities  HTN, tracheostomy    Examination-Activity Limitations  Stairs;Stand    Examination-Participation Restrictions  Meal Prep    Stability/Clinical Decision Making  Stable/Uncomplicated    Rehab Potential  Good    PT Frequency  2x / week    PT Treatment/Interventions  ADLs/Self Care Home Management;Moist Heat;Ultrasound;Electrical Stimulation;Cryotherapy;Gait training;Stair training;Functional mobility training;Therapeutic activities;Therapeutic exercise;Balance training;Neuromuscular re-education;Patient/family education;Passive range of motion;Manual techniques;Taping;Vasopneumatic Device    PT Next Visit Plan  cont with POC gentle strengthening of LEs, modalities PRN for pain relief    Consulted and Agree with Plan of Care  Patient       Patient will benefit from skilled therapeutic intervention in order to improve the following deficits and impairments:  Decreased activity tolerance, Decreased strength, Decreased range of motion, Difficulty walking, Pain  Visit Diagnosis: Chronic pain of right knee  Chronic pain of left knee  Difficulty in walking, not elsewhere classified     Problem List Patient Active Problem List   Diagnosis Date Noted  . Bilateral chronic knee pain 03/01/2018  . Severe obesity (BMI >= 40) ([place]) 11/12/2013  . GERD (gastroesophageal reflux disease) 11/10/2013  . OSA (obstructive sleep apnea) 11/10/2013  . CKD (chronic kidney disease), stage III 11/10/2013  . Hyperglycemia 11/10/2013  . Hypertension   . Hypercholesteremia     Dennis Spencer P, PTA 02/10/2019, 10:02 AM  [facility] [place], [place], [address] Phone: [phone]   Fax:  [phone]  Name: Dennis Spencer MRN: [id number] Date of Birth: 09-23-1945

## 2019-02-12 ENCOUNTER — Other Ambulatory Visit: Payer: Self-pay | Admitting: Family Medicine

## 2019-02-12 DIAGNOSIS — E7800 Pure hypercholesterolemia, unspecified: Secondary | ICD-10-CM

## 2019-02-15 ENCOUNTER — Ambulatory Visit: Payer: Medicare HMO | Admitting: Physical Therapy

## 2019-02-15 ENCOUNTER — Other Ambulatory Visit: Payer: Self-pay

## 2019-02-15 ENCOUNTER — Encounter: Payer: Self-pay | Admitting: Physical Therapy

## 2019-02-15 DIAGNOSIS — M25561 Pain in right knee: Secondary | ICD-10-CM

## 2019-02-15 DIAGNOSIS — R262 Difficulty in walking, not elsewhere classified: Secondary | ICD-10-CM

## 2019-02-15 DIAGNOSIS — G8929 Other chronic pain: Secondary | ICD-10-CM | POA: Diagnosis not present

## 2019-02-15 DIAGNOSIS — M25562 Pain in left knee: Secondary | ICD-10-CM

## 2019-02-15 NOTE — Therapy (Addendum)
[facility] [place], [place], [address] Phone: [phone]   Fax:  [phone]  Physical Therapy Treatment PHYSICAL THERAPY DISCHARGE SUMMARY  Visits from Start of Care: 7  Current functional level related to goals / functional outcomes: See below   Remaining deficits: See goals   Education / Equipment: HEP Plan: Patient agrees to discharge.  Patient goals were partially met. Patient is being discharged due to not returning since the last visit.  ?????    Gabriela Eves, PT, DPT  06/30/19 Patient Details  Name: TWAIN STENSETH MRN: [id number] Date of Birth: November 24, 1945 Referring Provider (PT): Sydnee Cabal, MD   Encounter Date: 02/15/2019  PT End of Session - 02/15/19 0951    Visit Number  7    Number of Visits  8    Date for PT Re-Evaluation  03/09/19    Authorization Type  FOTO; progress note every 10th visit; KX modifier at 15th visit    PT Start Time  0900    PT Stop Time  0948    PT Time Calculation (min)  48 min    Activity Tolerance  Patient tolerated treatment well    Behavior During Therapy  [facility] for tasks assessed/performed       Past Medical History:  Diagnosis Date  . Anticoagulant long-term use    brilinta  . Arthritis   . Carotid stenosis, bilateral    per duplex 04-23-2018  bilateral ICA <50%  . Chronic cough    per pt this normal due to tracheostomy  . CKD (chronic kidney disease), stage III 11/10/2013  . GERD (gastroesophageal reflux disease)   . History of CVA (cerebrovascular accident) 04/13/2018   chronic lacunar infarct--- per pt no residuals  . History of kidney stones    multiple kidney stones  . History of osteomyelitis age 21   LLE  . Hyperlipidemia   . Hypertension   . OSA (obstructive sleep apnea) study in epic 09-25-2005  severe osa   1981 s/p tracheostomy, tongue reduction and septoplasty;  LEAVES TRACH OPEN AT NIGHT FOR SLEEP APNEA  . Pneumonia   . Presence of  tracheostomy ([place]) since 1981   uncaps at night due to sleep apnea - caps during the day  . Renal calculi    bilateral  . Severe obesity (BMI >= 40) (HCC)   . Urgency of urination   . Wears glasses     Past Surgical History:  Procedure Laterality Date  . CHOLECYSTECTOMY N/A 06/15/2015   Procedure: LAPAROSCOPIC CHOLECYSTECTOMY;  Surgeon: Ralene Ok, MD;  [facility];  Service: General;  Laterality: N/A;  . COLONOSCOPY  last one 03-28-2016  . CYSTOSCOPY W/ URETERAL STENT REMOVAL Right 06/26/2017   Procedure: CYSTOSCOPY WITH STENT REMOVAL;  Surgeon: Alexis Frock, MD;  [facility];  Service: Urology;  Laterality: Right;  . CYSTOSCOPY W/ URETERAL STENT REMOVAL Bilateral 10/22/2018   Procedure: CYSTOSCOPY WITH STENT REMOVAL;  Surgeon: Alexis Frock, MD;  [facility];  Service: Urology;  Laterality: Bilateral;  . CYSTOSCOPY WITH RETROGRADE PYELOGRAM, URETEROSCOPY AND STENT PLACEMENT Bilateral 06/18/2013   Procedure: CYSTOSCOPY WITH RETROGRADE PYELOGRAM, Left URETEROSCOPY with laser , AND bilateral STENT PLACEMENT;  Surgeon: Alexis Frock, MD;  [facility];  Service: Urology;  Laterality: Bilateral;  . CYSTOSCOPY WITH RETROGRADE PYELOGRAM, URETEROSCOPY AND STENT PLACEMENT Right 08/08/2013   Procedure: CYSTOSCOPY WITH RIGHT RETROGRADE PYELOGRAM, URETEROSCOPY AND STENT PLACEMENT, stone extraction;  Surgeon: Alexis Frock, MD;  [facility];  Service: Urology;  Laterality: Right;  . CYSTOSCOPY WITH RETROGRADE PYELOGRAM, URETEROSCOPY AND STENT PLACEMENT Right 08/10/2013   Procedure: 2ND STAGE CYSTOSCOPY WITH RETROGRADE PYELOGRAM, URETEROSCOPY AND STENT EXCHANGE;  Surgeon: Alexis Frock, MD;  [facility];  Service: Urology;  Laterality: Right;  . CYSTOSCOPY WITH RETROGRADE PYELOGRAM, URETEROSCOPY AND STENT PLACEMENT Bilateral 05/22/2017   Procedure: CYSTOSCOPY WITH RETROGRADE PYELOGRAM, URETEROSCOPY AND STENT PLACEMENT;  Surgeon: Alexis Frock, MD;  [facility];   Service: Urology;  Laterality: Bilateral;  . CYSTOSCOPY WITH RETROGRADE PYELOGRAM, URETEROSCOPY AND STENT PLACEMENT Bilateral 06/05/2017   Procedure: CYSTOSCOPY WITH RETROGRADE PYELOGRAM, URETEROSCOPY AND STENT PLACEMENT;  Surgeon: Alexis Frock, MD;  [facility];  Service: Urology;  Laterality: Bilateral;  . CYSTOSCOPY WITH RETROGRADE PYELOGRAM, URETEROSCOPY AND STENT PLACEMENT Bilateral 08/13/2018   Procedure: CYSTOSCOPY WITH RETROGRADE PYELOGRAM, URETEROSCOPY AND STENT PLACEMENT;  Surgeon: Alexis Frock, MD;  [facility];  Service: Urology;  Laterality: Bilateral;  90 MINS  . CYSTOSCOPY WITH RETROGRADE PYELOGRAM, URETEROSCOPY AND STENT PLACEMENT Bilateral 10/22/2018   Procedure: CYSTOSCOPY WITH RETROGRADE PYELOGRAM, URETEROSCOPY  STONE BASKETRY AND STENT PLACEMENT;  Surgeon: Alexis Frock, MD;  [facility];  Service: Urology;  Laterality: Bilateral;  . CYSTOSCOPY/URETEROSCOPY/HOLMIUM LASER/STENT PLACEMENT Left 06/26/2017   Procedure: CYSTOSCOPY/URETEROSCOPY third stage/HOLMIUM LASER/STENT PLACEMENT retrograde pylegram;  Surgeon: Alexis Frock, MD;  [facility];  Service: Urology;  Laterality: Left;  Marland Kitchen GASTRIC ROUX-EN-Y  2004    @[facility]   . HOLMIUM LASER APPLICATION Bilateral 08/14/8183   Procedure: HOLMIUM LASER APPLICATION;  Surgeon: Alexis Frock, MD;  [facility];  Service: Urology;  Laterality: Bilateral;  . HOLMIUM LASER APPLICATION Left 10/13/1495   Procedure: HOLMIUM LASER APPLICATION;  Surgeon: Alexis Frock, MD;  [facility];  Service: Urology;  Laterality: Left;  . HOLMIUM LASER APPLICATION Bilateral 0/26/3785   Procedure: HOLMIUM LASER APPLICATION;  Surgeon: Alexis Frock, MD;  [facility];  Service: Urology;  Laterality: Bilateral;  . HOLMIUM LASER APPLICATION Bilateral 12/17/5025   Procedure: HOLMIUM LASER APPLICATION;  Surgeon: Alexis Frock, MD;  [facility];  Service: Urology;  Laterality: Bilateral;  . HOLMIUM LASER APPLICATION Bilateral  7/41/2878   Procedure: HOLMIUM LASER APPLICATION;  Surgeon: Alexis Frock, MD;  [facility];  Service: Urology;  Laterality: Bilateral;  . NEPHROLITHOTOMY Left 08/08/2013   Procedure: 1ST STAGE NEPHROLITHOTOMY PERCUTANEOUS WITH SURGEON ACCESS;  Surgeon: Alexis Frock, MD;  [facility];  Service: Urology;  Laterality: Left;  . NEPHROLITHOTOMY Left 08/10/2013   Procedure: NEPHROLITHOTOMY PERCUTANEOUS SECOND LOOK/LEFT DIGITAL URETEROSCOPY/BASKETING OF STONE/EXCHANGE OF LEFT URETERAL STENT;  Surgeon: Alexis Frock, MD;  [facility];  Service: Urology;  Laterality: Left;  . ORCHIOPEXY  child   unlateral undescended testis  . PERCUTANEOUS NEPHROSTOLITHOTOMY Left 09-19-2009   dr dahlstedt  @[facility]   . TONSILLECTOMY  child  . TRACHEOSTOMY  1987   s/p tongue reduction and nasal septum deviation repair for  Sleep apnea   . URETEROSCOPY WITH HOLMIUM LASER LITHOTRIPSY Left 04-14-2002   dr Diona Fanti  @[facility]   . WISDOM TOOTH EXTRACTION      There were no vitals filed for this visit.  Subjective Assessment - 02/15/19 0916    Subjective  COVID 19 screening performed on patient upon arrival. Patient reports ongoing discomfort with more stiffness since start of therapy.    Pertinent History  HTN, tracheostomy,    Limitations  Standing;Walking;House hold activities    How long can you stand comfortably?  10-15 mins    How long can you walk comfortably?  15-20 minutes  Diagnostic tests  X-ray: bilateral knee OA    Patient Stated Goals  improve mobility and decrease pain    Currently in Pain?  Yes    Pain Score  5     Pain Location  Knee    Pain Orientation  Right;Left         [facility] PT Assessment - 02/15/19 0001      Assessment   Medical Diagnosis  bilateral primary osteoarthritis of knee    Referring Provider (PT)  Sydnee Cabal, MD    Next MD Visit  02/16/2019    Prior Therapy  no      Precautions   Precautions  None      Observation/Other Assessments   Focus on Therapeutic  Outcomes (FOTO)   54% limitation      AROM   AROM Assessment Site  Knee    Right/Left Knee  Right;Left    Right Knee Flexion  113    Left Knee Flexion  115      Strength   Right Knee Flexion  4+/5    Right Knee Extension  4+/5    Left Knee Flexion  4+/5    Left Knee Extension  4/5                   [facility] Adult PT Treatment/Exercise - 02/15/19 0001      Knee/Hip Exercises: Aerobic   Nustep  L4 x10 min UE/LE activity, monitored      Knee/Hip Exercises: Standing   Hip Abduction  AROM;Both;20 reps;Knee straight    Rocker Board  3 minutes      Knee/Hip Exercises: Seated   Long Arc Quad  Strengthening;Both;20 reps;Weights    Long Arc Quad Weight  4 lbs.    Long [employer] Limitations  with ball squeeze    Clamshell with [employer]   x30     Knee/Hip Exercises: Supine   Straight Leg Raises  Strengthening;Both;2 sets;10 reps      Modalities   Modalities  Electrical Stimulation;Moist Heat      Moist Heat Therapy   Number Minutes Moist Heat  10 Minutes    Moist Heat Location  Knee   bilateral     Electrical Stimulation   Electrical Stimulation Location  B knee    Electrical Stimulation Action  pre-mod    Electrical Stimulation Parameters  80-150 hz x10 mins    Electrical Stimulation Goals  Pain                  PT Long Term Goals - 02/15/19 0930      PT LONG TERM GOAL #1   Title  Patient will be independent with HEP    Time  4    Period  Weeks    Status  Achieved      PT LONG TERM GOAL #2   Title  Patient will demonstrate 115+ degrees of bilateral knee flexion AROM to improve ability to perform functional task    Baseline  AROM 115 LT / AROM 113 RT 02/15/19    Time  4    Period  Weeks    Status  Partially Met      PT LONG TERM GOAL #3   Title  Patient will demonstrate 4+/5 or greater bilateral knee MMT in all planes to improve stability during functional tasks.    Time  4    Period  Weeks    Status  Partially Met  PT LONG TERM  GOAL #4   Title  Patient will report ability to stand for 30 minutes or greater with bilateral knee pain less than or equal to 3/10 for meal preparation    Time  4    Period  Weeks    Status  On-going            Plan - 02/15/19 0951    Clinical Impression Statement  Patient was able to tolerate treatment fairly well but reported increased pain with standing exercises. Patient was able to complete exercises despite discomfort. Patient reported getting an injection tomorrow, 02/16/2019. Goals still ongoing at this time. FOTO limitation 54% no change since IE. MHP applied instead of vasopneumatic device to which patient reported less stiffness at end of session.    Personal Factors and Comorbidities  Age;Comorbidity 1    Comorbidities  HTN, tracheostomy    Examination-Activity Limitations  Stairs;Stand    Examination-Participation Restrictions  Meal Prep    Stability/Clinical Decision Making  Stable/Uncomplicated    Clinical Decision Making  Low    Rehab Potential  Good    PT Frequency  2x / week    PT Duration  4 weeks    PT Treatment/Interventions  ADLs/Self Care Home Management;Moist Heat;Ultrasound;Electrical Stimulation;Cryotherapy;Gait training;Stair training;Functional mobility training;Therapeutic activities;Therapeutic exercise;Balance training;Neuromuscular re-education;Patient/family education;Passive range of motion;Manual techniques;Taping;Vasopneumatic Device    PT Next Visit Plan  cont with POC gentle strengthening of LEs, modalities PRN for pain relief    PT Home Exercise Plan  see patient education section    Consulted and Agree with Plan of Care  Patient       Patient will benefit from skilled therapeutic intervention in order to improve the following deficits and impairments:  Decreased activity tolerance, Decreased strength, Decreased range of motion, Difficulty walking, Pain  Visit Diagnosis: Chronic pain of right knee  Chronic pain of left knee  Difficulty in  walking, not elsewhere classified     Problem List Patient Active Problem List   Diagnosis Date Noted  . Bilateral chronic knee pain 03/01/2018  . Severe obesity (BMI >= 40) ([place]) 11/12/2013  . GERD (gastroesophageal reflux disease) 11/10/2013  . OSA (obstructive sleep apnea) 11/10/2013  . CKD (chronic kidney disease), stage III 11/10/2013  . Hyperglycemia 11/10/2013  . Hypertension   . Hypercholesteremia     Gabriela Eves, PT, DPT 02/15/2019, 9:57 AM  [facility] [address] [place], [place], [address] Phone: [phone]   Fax:  [phone]  Name: MISCHA POLLARD MRN: [id number] Date of Birth: Jun 12, 1945

## 2019-02-16 DIAGNOSIS — M1711 Unilateral primary osteoarthritis, right knee: Secondary | ICD-10-CM | POA: Diagnosis not present

## 2019-02-16 DIAGNOSIS — M170 Bilateral primary osteoarthritis of knee: Secondary | ICD-10-CM | POA: Diagnosis not present

## 2019-02-16 DIAGNOSIS — M1712 Unilateral primary osteoarthritis, left knee: Secondary | ICD-10-CM | POA: Diagnosis not present

## 2019-02-17 ENCOUNTER — Encounter: Payer: Medicare HMO | Admitting: Physical Therapy

## 2019-02-22 ENCOUNTER — Ambulatory Visit: Payer: Medicare HMO | Admitting: *Deleted

## 2019-02-23 ENCOUNTER — Other Ambulatory Visit: Payer: Self-pay | Admitting: Family Medicine

## 2019-02-23 DIAGNOSIS — M170 Bilateral primary osteoarthritis of knee: Secondary | ICD-10-CM | POA: Diagnosis not present

## 2019-02-23 DIAGNOSIS — M1712 Unilateral primary osteoarthritis, left knee: Secondary | ICD-10-CM | POA: Diagnosis not present

## 2019-02-23 DIAGNOSIS — M1711 Unilateral primary osteoarthritis, right knee: Secondary | ICD-10-CM | POA: Diagnosis not present

## 2019-03-02 DIAGNOSIS — M1711 Unilateral primary osteoarthritis, right knee: Secondary | ICD-10-CM | POA: Diagnosis not present

## 2019-03-02 DIAGNOSIS — M170 Bilateral primary osteoarthritis of knee: Secondary | ICD-10-CM | POA: Diagnosis not present

## 2019-03-02 DIAGNOSIS — M1712 Unilateral primary osteoarthritis, left knee: Secondary | ICD-10-CM | POA: Diagnosis not present

## 2019-03-16 ENCOUNTER — Other Ambulatory Visit: Payer: Self-pay | Admitting: Family Medicine

## 2019-03-16 DIAGNOSIS — I10 Essential (primary) hypertension: Secondary | ICD-10-CM

## 2019-03-26 ENCOUNTER — Other Ambulatory Visit: Payer: Self-pay | Admitting: Family Medicine

## 2019-03-28 ENCOUNTER — Other Ambulatory Visit: Payer: Self-pay | Admitting: Family Medicine

## 2019-03-28 DIAGNOSIS — I10 Essential (primary) hypertension: Secondary | ICD-10-CM

## 2019-03-31 ENCOUNTER — Telehealth: Payer: Self-pay | Admitting: Family Medicine

## 2019-03-31 NOTE — Chronic Care Management (AMB) (Signed)
°  Care Management   Note  03/31/2019 Name: Dennis Spencer MRN: [id number] DOB: 1945-09-22  Dennis Spencer is a 73 y.o. year old male who is a primary care patient of Stacks, Cletus Gash, MD and is actively engaged with the care management team. I reached out to [name] by phone today to assist with scheduling a follow up appointment with the RN Case Manager  Follow up plan: Telephone appointment with CCM team member scheduled for: 04/05/2019  [facility] [place] [facility]  [place], [place] [address] Direct Dial: [email or web address]  Website: [email or web address]

## 2019-04-05 ENCOUNTER — Telehealth: Payer: Medicare HMO

## 2019-04-05 ENCOUNTER — Ambulatory Visit: Payer: Medicare HMO | Admitting: *Deleted

## 2019-04-05 ENCOUNTER — Ambulatory Visit: Payer: Medicare HMO | Admitting: Psychology

## 2019-04-05 DIAGNOSIS — M25561 Pain in right knee: Secondary | ICD-10-CM

## 2019-04-05 DIAGNOSIS — I10 Essential (primary) hypertension: Secondary | ICD-10-CM

## 2019-04-05 DIAGNOSIS — G8929 Other chronic pain: Secondary | ICD-10-CM

## 2019-04-05 DIAGNOSIS — N183 Chronic kidney disease, stage 3 unspecified: Secondary | ICD-10-CM

## 2019-04-05 NOTE — Chronic Care Management (AMB) (Signed)
  Chronic Care Management   Follow Up Note   04/05/2019 Name: Dennis Spencer MRN: [id number] DOB: 1946-02-18  Referred by: Claretta Fraise, MD Reason for referral : Chronic Care Management (RN Follow up)   Dennis Spencer is a 73 y.o. year old male who is a primary care patient of Stacks, Cletus Gash, MD. The CCM team was consulted for assistance with chronic disease management and care coordination needs.    I reached out to [name] today by telephone to follow-up on his chronic care needs. I was unable to speak with him but did leave a HIPAA compliant voicemail requesting a return phone call.   Follow-up Plan: RN will reach out to patient over the next 15 days if patient does not return telephone call.    Chong Sicilian, BSN, RN-BC Embedded Chronic Care Manager [facility] [facility] Direct Dial: [phone]

## 2019-04-05 NOTE — Patient Instructions (Signed)
  Follow-up Plan: RN will reach out to patient over the next 15 days if patient does not return telephone call.    Chong Sicilian, BSN, RN-BC Embedded Chronic Care Manager [facility] Direct Dial: [phone]

## 2019-04-11 ENCOUNTER — Encounter: Payer: Self-pay | Admitting: Family

## 2019-04-11 ENCOUNTER — Ambulatory Visit (INDEPENDENT_AMBULATORY_CARE_PROVIDER_SITE_OTHER): Payer: Medicare HMO | Admitting: Family

## 2019-04-11 DIAGNOSIS — Z87442 Personal history of urinary calculi: Secondary | ICD-10-CM

## 2019-04-11 DIAGNOSIS — N200 Calculus of kidney: Secondary | ICD-10-CM | POA: Diagnosis not present

## 2019-04-11 MED ORDER — TAMSULOSIN HCL 0.4 MG PO CAPS: 0.4000 mg | ORAL_CAPSULE | Freq: Every day | ORAL | 3 refills | Status: DC

## 2019-04-11 NOTE — Progress Notes (Signed)
   Virtual Visit via telephone Note Due to COVID-19 pandemic this visit was conducted virtually. This visit type was conducted due to national recommendations for restrictions regarding the COVID-19 Pandemic (e.g. social distancing, sheltering in place) in an effort to limit this patient's exposure and mitigate transmission in our community. All issues noted in this document were discussed and addressed.  A physical exam was not performed with this format.  I connected with Dennis Spencer on 04/11/19 at 1:38 pm by telephone and verified that I am speaking with the correct person using two identifiers. Dennis Spencer is currently located at home and wife  is currently with him  during visit. The provider, Evelina Dun, FNP is located in their office at time of visit.  I discussed the limitations, risks, security and privacy concerns of performing an evaluation and management service by telephone and the availability of in person appointments. I also discussed with the patient that there may be a patient responsible charge related to this service. The patient expressed understanding and agreed to proceed.   History and Present Illness:  Back Pain   PT is calling the office today with left flank pain that woke him up this morning. He states he woke up with pain of a 10 out 10. He has a hx of kidney stones and has had about 30+ in his life time. He states the pain is resolved now and must have "passed it" or it is sitting in his bladder.   He is followed by Urologists and has follow up in next 3 weeks.   Review of Systems  Musculoskeletal: Positive for back pain.  All other systems reviewed and are negative.    Observations/Objective: No SOB or distress noted   Assessment and Plan: 1. Nephrolithiasis Force fluids Keep Urologists appt Will start Flomax today - tamsulosin (FLOMAX) 0.4 MG CAPS capsule; Take 1 capsule (0.4 mg total) by mouth daily.  Dispense: 30 capsule; Refill: 3  2.  History of kidney stones - tamsulosin (FLOMAX) 0.4 MG CAPS capsule; Take 1 capsule (0.4 mg total) by mouth daily.  Dispense: 30 capsule; Refill: 3     I discussed the assessment and treatment plan with the patient. The patient was provided an opportunity to ask questions and all were answered. The patient agreed with the plan and demonstrated an understanding of the instructions.   The patient was advised to call back or seek an in-person evaluation if the symptoms worsen or if the condition fails to improve as anticipated.  The above assessment and management plan was discussed with the patient. The patient verbalized understanding of and has agreed to the management plan. Patient is aware to call the clinic if symptoms persist or worsen. Patient is aware when to return to the clinic for a follow-up visit. Patient educated on when it is appropriate to go to the emergency department.   Time call ended:  1:49 pm  I provided 11 minutes of non-face-to-face time during this encounter.    Evelina Dun, FNP

## 2019-04-18 DIAGNOSIS — M1711 Unilateral primary osteoarthritis, right knee: Secondary | ICD-10-CM | POA: Diagnosis not present

## 2019-04-18 DIAGNOSIS — M170 Bilateral primary osteoarthritis of knee: Secondary | ICD-10-CM | POA: Diagnosis not present

## 2019-04-18 DIAGNOSIS — M1712 Unilateral primary osteoarthritis, left knee: Secondary | ICD-10-CM | POA: Diagnosis not present

## 2019-04-25 ENCOUNTER — Encounter: Payer: Self-pay | Admitting: Family Medicine

## 2019-04-25 ENCOUNTER — Other Ambulatory Visit: Payer: Self-pay | Admitting: Family Medicine

## 2019-04-25 DIAGNOSIS — Z125 Encounter for screening for malignant neoplasm of prostate: Secondary | ICD-10-CM

## 2019-04-25 DIAGNOSIS — N183 Chronic kidney disease, stage 3 unspecified: Secondary | ICD-10-CM

## 2019-04-25 DIAGNOSIS — I10 Essential (primary) hypertension: Secondary | ICD-10-CM

## 2019-04-25 DIAGNOSIS — K219 Gastro-esophageal reflux disease without esophagitis: Secondary | ICD-10-CM

## 2019-04-25 DIAGNOSIS — E7800 Pure hypercholesterolemia, unspecified: Secondary | ICD-10-CM

## 2019-04-25 DIAGNOSIS — R739 Hyperglycemia, unspecified: Secondary | ICD-10-CM

## 2019-04-26 ENCOUNTER — Other Ambulatory Visit: Payer: Medicare HMO

## 2019-04-26 ENCOUNTER — Other Ambulatory Visit: Payer: Self-pay

## 2019-04-26 DIAGNOSIS — Z125 Encounter for screening for malignant neoplasm of prostate: Secondary | ICD-10-CM | POA: Diagnosis not present

## 2019-04-26 DIAGNOSIS — K219 Gastro-esophageal reflux disease without esophagitis: Secondary | ICD-10-CM | POA: Diagnosis not present

## 2019-04-26 DIAGNOSIS — E7800 Pure hypercholesterolemia, unspecified: Secondary | ICD-10-CM | POA: Diagnosis not present

## 2019-04-26 DIAGNOSIS — I10 Essential (primary) hypertension: Secondary | ICD-10-CM | POA: Diagnosis not present

## 2019-04-26 DIAGNOSIS — N183 Chronic kidney disease, stage 3 unspecified: Secondary | ICD-10-CM | POA: Diagnosis not present

## 2019-04-26 DIAGNOSIS — R739 Hyperglycemia, unspecified: Secondary | ICD-10-CM

## 2019-04-26 LAB — BAYER DCA HB A1C WAIVED: HB A1C (BAYER DCA - WAIVED): 6 % (ref <7.0)

## 2019-04-27 LAB — CMP14+EGFR
ALT: 14 IU/L (ref 0–44)
AST: 21 IU/L (ref 0–40)
Albumin/Globulin Ratio: 1.7 (ref 1.2–2.2)
Albumin: 3.8 g/dL (ref 3.7–4.7)
Alkaline Phosphatase: 101 IU/L (ref 39–117)
BUN/Creatinine Ratio: 12 (ref 10–24)
BUN: 16 mg/dL (ref 8–27)
Bilirubin Total: 0.7 mg/dL (ref 0.0–1.2)
CO2: 22 mmol/L (ref 20–29)
Calcium: 9.2 mg/dL (ref 8.6–10.2)
Chloride: 101 mmol/L (ref 96–106)
Creatinine, Ser: 1.36 mg/dL — ABNORMAL HIGH (ref 0.76–1.27)
GFR calc Af Amer: 60 mL/min/{1.73_m2} (ref >59)
GFR calc non Af Amer: 52 mL/min/{1.73_m2} — ABNORMAL LOW (ref >59)
Globulin, Total: 2.3 g/dL (ref 1.5–4.5)
Glucose: 124 mg/dL — ABNORMAL HIGH (ref 65–99)
Potassium: 4.3 mmol/L (ref 3.5–5.2)
Sodium: 140 mmol/L (ref 134–144)
Total Protein: 6.1 g/dL (ref 6.0–8.5)

## 2019-04-27 LAB — CBC WITH DIFFERENTIAL/PLATELET
Basophils Absolute: 0.1 10*3/uL (ref 0.0–0.2)
Basos: 1 %
EOS (ABSOLUTE): 0.2 10*3/uL (ref 0.0–0.4)
Eos: 5 %
Hematocrit: 39.8 % (ref 37.5–51.0)
Hemoglobin: 13.1 g/dL (ref 13.0–17.7)
Immature Grans (Abs): 0 10*3/uL (ref 0.0–0.1)
Immature Granulocytes: 0 %
Lymphocytes Absolute: 1.9 10*3/uL (ref 0.7–3.1)
Lymphs: 36 %
MCH: 28.7 pg (ref 26.6–33.0)
MCHC: 32.9 g/dL (ref 31.5–35.7)
MCV: 87 fL (ref 79–97)
Monocytes Absolute: 0.6 10*3/uL (ref 0.1–0.9)
Monocytes: 10 %
Neutrophils Absolute: 2.6 10*3/uL (ref 1.4–7.0)
Neutrophils: 48 %
Platelets: 251 10*3/uL (ref 150–450)
RBC: 4.57 x10E6/uL (ref 4.14–5.80)
RDW: 14 % (ref 11.6–15.4)
WBC: 5.3 10*3/uL (ref 3.4–10.8)

## 2019-04-27 LAB — LIPID PANEL
Chol/HDL Ratio: 3.3 ratio (ref 0.0–5.0)
Cholesterol, Total: 134 mg/dL (ref 100–199)
HDL: 41 mg/dL (ref >39)
LDL Chol Calc (NIH): 67 mg/dL (ref 0–99)
Triglycerides: 148 mg/dL (ref 0–149)
VLDL Cholesterol Cal: 26 mg/dL (ref 5–40)

## 2019-04-27 LAB — PSA TOTAL (REFLEX TO FREE): Prostate Specific Ag, Serum: 0.7 ng/mL (ref 0.0–4.0)

## 2019-05-02 ENCOUNTER — Other Ambulatory Visit: Payer: Self-pay

## 2019-05-02 ENCOUNTER — Ambulatory Visit (INDEPENDENT_AMBULATORY_CARE_PROVIDER_SITE_OTHER): Payer: Medicare HMO | Admitting: Family Medicine

## 2019-05-02 ENCOUNTER — Encounter: Payer: Self-pay | Admitting: Family Medicine

## 2019-05-02 VITALS — BP 140/74 | HR 91 | Temp 98.0°F | Resp 20 | Ht 70.0 in | Wt 329.0 lb

## 2019-05-02 DIAGNOSIS — Z23 Encounter for immunization: Secondary | ICD-10-CM | POA: Diagnosis not present

## 2019-05-02 DIAGNOSIS — E7800 Pure hypercholesterolemia, unspecified: Secondary | ICD-10-CM | POA: Diagnosis not present

## 2019-05-02 DIAGNOSIS — Z0001 Encounter for general adult medical examination with abnormal findings: Secondary | ICD-10-CM | POA: Diagnosis not present

## 2019-05-02 DIAGNOSIS — Z0000 Encounter for general adult medical examination without abnormal findings: Secondary | ICD-10-CM

## 2019-05-02 DIAGNOSIS — I10 Essential (primary) hypertension: Secondary | ICD-10-CM

## 2019-05-02 MED ORDER — TICAGRELOR 60 MG PO TABS: ORAL_TABLET | ORAL | 0 refills | Status: DC

## 2019-05-02 MED ORDER — ATORVASTATIN CALCIUM 80 MG PO TABS: 80.0000 mg | ORAL_TABLET | Freq: Every day | ORAL | 3 refills | Status: DC

## 2019-05-02 MED ORDER — PANTOPRAZOLE SODIUM 40 MG PO TBEC: 40.0000 mg | DELAYED_RELEASE_TABLET | Freq: Every day | ORAL | 3 refills | Status: DC

## 2019-05-02 MED ORDER — GABAPENTIN 600 MG PO TABS: 300.0000 mg | ORAL_TABLET | Freq: Every day | ORAL | 5 refills | Status: DC

## 2019-05-02 MED ORDER — AMLODIPINE BESYLATE 5 MG PO TABS: ORAL_TABLET | ORAL | 1 refills | Status: DC

## 2019-05-02 MED ORDER — SAXENDA 18 MG/3ML ~~LOC~~ SOPN: PEN_INJECTOR | SUBCUTANEOUS | 1 refills | Status: DC

## 2019-05-02 NOTE — Addendum Note (Signed)
Addended by: Claretta Fraise on: 05/02/2019 05:34 PM   Modules accepted: Orders

## 2019-05-02 NOTE — Progress Notes (Addendum)
Subjective:  Patient ID: Dennis Spencer, male    DOB: 1946-01-18  Age: 73 y.o. MRN: [id number]  CC: Annual Exam   HPI Dennis Spencer presents for  in for follow-up of elevated cholesterol. Doing well without complaints on current medication. Denies side effects of statin including myalgia and arthralgia and nausea. Currently no chest pain, shortness of breath or other cardiovascular related symptoms noted.  presents for  follow-up of hypertension. Patient has no history of headache chest pain or shortness of breath or recent cough. Patient also denies symptoms of TIA such as focal numbness or weakness. Patient denies side effects from medication. States taking it regularly. Pt. Also having a lot of Knee pain. Needs replacement, but needs to get his BMI down to under 40 first. Also his A1c came back at 6.0, indicating prediabetes. PT. Wants to try saxenda to assist with weight loss.   Depression screen [name] 2/9 05/02/2019 09/27/2018 05/28/2018  Decreased Interest 0 1 1  Down, Depressed, Hopeless 0 1 1  PHQ - 2 Score 0 2 2  Altered sleeping - 0 0  Tired, decreased energy - 1 2  Change in appetite - 0 0  Feeling bad or failure about yourself  - 0 0  Trouble concentrating - 0 0  Moving slowly or fidgety/restless - 0 0  Suicidal thoughts - 0 0  PHQ-9 Score - 3 4  Difficult doing work/chores - Not difficult at all -    History Dennis Spencer has a past medical history of Anticoagulant long-term use, Arthritis, Carotid stenosis, bilateral, Chronic cough, CKD (chronic kidney disease), stage III (11/10/2013), GERD (gastroesophageal reflux disease), History of CVA (cerebrovascular accident) (04/13/2018), History of kidney stones, History of osteomyelitis (age 22), Hyperlipidemia, Hypertension, OSA (obstructive sleep apnea) (study in epic 09-25-2005  severe osa), Pneumonia, 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     He has a past surgical history that includes Cystoscopy with retrograde pyelogram, ureteroscopy and stent placement (Bilateral, 06/18/2013); Holmium laser application (Bilateral, 06/18/2013); Tracheostomy (1987); Nephrolithotomy (Left, 08/08/2013); Cystoscopy with retrograde pyelogram, ureteroscopy and stent placement (Right, 08/08/2013); Nephrolithotomy (Left, 08/10/2013); Cystoscopy with retrograde pyelogram, ureteroscopy and stent placement (Right, 08/10/2013); Holmium laser application (Left, [id number]); Cholecystectomy (N/A, 06/15/2015); Colonoscopy (last one 03-28-2016); Cystoscopy with retrograde pyelogram, ureteroscopy and stent placement (Bilateral, 05/22/2017); Holmium laser application (Bilateral, 05/22/2017); Cystoscopy with retrograde pyelogram, ureteroscopy and stent placement (Bilateral, 06/05/2017); Cystoscopy/ureteroscopy/holmium laser/stent placement (Left, 06/26/2017); Cystoscopy w/ ureteral stent removal (Right, 06/26/2017); Tonsillectomy (child); Gastric Roux-En-Y (2004    @[facility] ); Percutaneous nephrostolithotomy (Left, 09-19-2009   dr Diona Fanti  @[facility] ); Ureteroscopy with holmium laser lithotripsy (Left, 04-14-2002   dr Diona Fanti  @[facility] ); Orchiopexy (child); Cystoscopy with retrograde pyelogram, ureteroscopy and stent placement (Bilateral, 08/13/2018); Holmium laser application (Bilateral, 08/13/2018); Wisdom tooth extraction; Cystoscopy with retrograde pyelogram, ureteroscopy and stent placement (Bilateral, 10/22/2018); Holmium laser application (Bilateral, 10/22/2018); and Cystoscopy w/ ureteral stent removal (Bilateral, 10/22/2018).   His family history includes Breast cancer in his maternal grandmother; Cancer in his mother; Colon cancer in his maternal grandfather; Diabetes in his father; Heart failure in his father; Hypertension in his father.He reports that he quit smoking about 51 years ago. His smoking use included cigarettes. He has a 8.00 pack-year smoking history. He has never used smokeless tobacco. He  reports previous alcohol use. He reports that he does not use drugs.    ROS Review of Systems  Constitutional: Negative for activity change, fatigue  and unexpected weight change.  HENT: Negative for congestion, ear pain, hearing loss, postnasal drip and trouble swallowing.   Eyes: Negative for pain and visual disturbance.  Respiratory: Negative for cough, chest tightness and shortness of breath.   Cardiovascular: Negative for chest pain, palpitations and leg swelling.  Gastrointestinal: Negative for abdominal distention, abdominal pain, blood in stool, constipation, diarrhea, nausea and vomiting.  Endocrine: Negative for cold intolerance, heat intolerance and polydipsia.  Genitourinary: Negative for difficulty urinating, dysuria, flank pain, frequency and urgency.  Musculoskeletal: Positive for arthralgias (knees). Negative for joint swelling.  Skin: Negative for color change, rash and wound.  Neurological: Negative for dizziness, syncope, speech difficulty, weakness, light-headedness, numbness and headaches.  Hematological: Does not bruise/bleed easily.  Psychiatric/Behavioral: Negative for confusion, decreased concentration, dysphoric mood and sleep disturbance. The patient is not nervous/anxious.     Objective:  BP 140/74   Pulse 91   Temp 98 F (36.7 C) (Temporal)   Resp 20   Ht 5\' 10"  (1.778 m)   Wt (!) 329 lb (149.2 kg)   SpO2 96%   BMI 47.21 kg/m   BP Readings from Last 3 Encounters:  05/02/19 140/74  10/22/18 (!) 141/71  10/19/18 138/61    Wt Readings from Last 3 Encounters:  05/02/19 (!) 329 lb (149.2 kg)  10/22/18 (!) 324 lb 14.4 oz (147.4 kg)  10/19/18 (!) 324 lb 14.4 oz (147.4 kg)     Physical Exam Constitutional:      General: He is not in acute distress.    Appearance: He is well-developed. He is obese. He is not ill-appearing.  HENT:     Head: Normocephalic and atraumatic.  Eyes:     Pupils: Pupils are equal, round, and reactive to light.  Neck:      Thyroid: No thyromegaly.     Trachea: No tracheal deviation.  Cardiovascular:     Rate and Rhythm: Normal rate and regular rhythm.     Heart sounds: Normal heart sounds. No murmur. No friction rub. No gallop.   Pulmonary:     Breath sounds: Normal breath sounds. No wheezing or rales.  Abdominal:     General: Bowel sounds are normal. There is no distension.     Palpations: Abdomen is soft. There is no mass.     Tenderness: There is no abdominal tenderness.     Hernia: There is no hernia in the left inguinal area.  Musculoskeletal:        General: Normal range of motion.     Cervical back: Normal range of motion.  Lymphadenopathy:     Cervical: No cervical adenopathy.  Skin:    General: Skin is warm and dry.  Neurological:     Mental Status: He is alert and oriented to person, place, and time.       Assessment & Plan:   Dennis Spencer was seen today for annual exam.  Diagnoses and all orders for this visit:  Well adult exam  Need for immunization against influenza -     Flu Vaccine QUAD High Dose(Fluad)  Hypercholesteremia -     atorvastatin (LIPITOR) 80 MG tablet; Take 1 tablet (80 mg total) by mouth daily.  Essential hypertension -     amLODipine (NORVASC) 5 MG tablet; TAKE 1 TABLET BY MOUTH EVERY DAY IN THE MORNING  Other orders -     ticagrelor (BRILINTA) 60 MG TABS tablet; TAKE 1 TABLET (60 MG TOTAL) BY MOUTH 2 (TWO) TIMES DAILY. -     gabapentin (  NEURONTIN) 600 MG tablet; Take 0.5 tablets (300 mg total) by mouth daily. -     pantoprazole (PROTONIX) 40 MG tablet; Take 1 tablet (40 mg total) by mouth daily. For stomach -     Liraglutide -Weight Management (SAXENDA) 18 MG/3ML SOPN; Inject 0.6 mg into the skin daily for 7 days, THEN 1.2 mg daily.       I have discontinued [name] "Steve"'s oxyCODONE-acetaminophen, senna-docusate, cephALEXin, traMADol, and tamsulosin. I have changed his Brilinta to ticagrelor. I have also changed his atorvastatin.  Additionally, I am having him start on Saxenda. Lastly, I am having him maintain his vitamin C, Vitamin D3, multivitamin with minerals, b complex vitamins, Calcium Citrate-Vitamin D (CITRACAL + D PO), amLODipine, gabapentin, and pantoprazole.  Allergies as of 05/02/2019      Reactions   Lisinopril Other (See Comments)   DIZZINESS      Medication List       Accurate as of May 02, 2019  5:34 PM. If you have any questions, ask your nurse or doctor.        STOP taking these medications   cephALEXin 500 MG capsule Commonly known as: Keflex Stopped by: Claretta Fraise, MD   oxyCODONE-acetaminophen 5-325 MG tablet Commonly known as: Percocet Stopped by: Claretta Fraise, MD   senna-docusate 8.6-50 MG tablet Commonly known as: Senokot-S Stopped by: Claretta Fraise, MD   tamsulosin 0.4 MG Caps capsule Commonly known as: FLOMAX Stopped by: Claretta Fraise, MD   traMADol 50 MG tablet Commonly known as: ULTRAM Stopped by: Claretta Fraise, MD     TAKE these medications   amLODipine 5 MG tablet Commonly known as: NORVASC TAKE 1 TABLET BY MOUTH EVERY DAY IN THE MORNING   atorvastatin 80 MG tablet Commonly known as: LIPITOR Take 1 tablet (80 mg total) by mouth daily. What changed: See the new instructions. Changed by: Claretta Fraise, MD   b complex vitamins tablet Take 1 tablet by mouth daily.   CITRACAL + D PO Take 1 tablet by mouth 2 (two) times daily.   gabapentin 600 MG tablet Commonly known as: NEURONTIN Take 0.5 tablets (300 mg total) by mouth daily.   multivitamin with minerals Tabs tablet Take 1 tablet by mouth daily.   pantoprazole 40 MG tablet Commonly known as: PROTONIX Take 1 tablet (40 mg total) by mouth daily. For stomach   Saxenda 18 MG/3ML Sopn Generic drug: Liraglutide -Weight Management Inject 0.6 mg into the skin daily for 7 days, THEN 1.2 mg daily. Start taking on: May 02, 2019 Started by: Claretta Fraise, MD   ticagrelor 60 MG Tabs  tablet Commonly known as: Brilinta TAKE 1 TABLET (60 MG TOTAL) BY MOUTH 2 (TWO) TIMES DAILY.   vitamin C 500 MG tablet Commonly known as: ASCORBIC ACID Take 500 mg by mouth 2 (two) times daily.   Vitamin D3 50 MCG (2000 UT) capsule Take 2,000 Units by mouth 2 (two) times daily.        Follow-up: Return in about 6 months (around 10/31/2019).  Claretta Fraise, M.D.

## 2019-05-10 DIAGNOSIS — R82992 Hyperoxaluria: Secondary | ICD-10-CM | POA: Diagnosis not present

## 2019-05-10 DIAGNOSIS — N200 Calculus of kidney: Secondary | ICD-10-CM | POA: Diagnosis not present

## 2019-05-10 DIAGNOSIS — N5201 Erectile dysfunction due to arterial insufficiency: Secondary | ICD-10-CM | POA: Diagnosis not present

## 2019-05-16 ENCOUNTER — Encounter: Payer: Self-pay | Admitting: Family Medicine

## 2019-05-31 ENCOUNTER — Encounter: Payer: Self-pay | Admitting: Family Medicine

## 2019-06-14 ENCOUNTER — Encounter: Payer: Self-pay | Admitting: Family Medicine

## 2019-06-14 ENCOUNTER — Other Ambulatory Visit: Payer: Self-pay | Admitting: Family Medicine

## 2019-06-14 MED ORDER — GABAPENTIN 600 MG PO TABS: 600.0000 mg | ORAL_TABLET | Freq: Every day | ORAL | 3 refills | Status: DC

## 2019-06-26 ENCOUNTER — Other Ambulatory Visit: Payer: Self-pay | Admitting: Family Medicine

## 2019-06-28 DIAGNOSIS — Z23 Encounter for immunization: Secondary | ICD-10-CM | POA: Diagnosis not present

## 2019-07-13 ENCOUNTER — Other Ambulatory Visit: Payer: Self-pay | Admitting: Family

## 2019-07-13 DIAGNOSIS — N200 Calculus of kidney: Secondary | ICD-10-CM

## 2019-07-13 DIAGNOSIS — Z87442 Personal history of urinary calculi: Secondary | ICD-10-CM

## 2019-10-23 ENCOUNTER — Encounter: Payer: Self-pay | Admitting: Family Medicine

## 2019-10-25 ENCOUNTER — Telehealth: Payer: Self-pay | Admitting: *Deleted

## 2019-10-25 ENCOUNTER — Ambulatory Visit (INDEPENDENT_AMBULATORY_CARE_PROVIDER_SITE_OTHER): Payer: Medicare HMO | Admitting: *Deleted

## 2019-10-25 DIAGNOSIS — Z0000 Encounter for general adult medical examination without abnormal findings: Secondary | ICD-10-CM | POA: Diagnosis not present

## 2019-10-25 NOTE — Telephone Encounter (Signed)
During AWV call patient requested a handicap placard and referral to dietician. He has two knees needing replacement but has to lose some weight before he can schedule surgery. Can he have a handicap placard as well as dietician referral?

## 2019-10-25 NOTE — Progress Notes (Signed)
MEDICARE ANNUAL WELLNESS VISIT  10/25/2019  Telephone Visit Disclaimer This Medicare AWV was conducted by telephone due to national recommendations for restrictions regarding the COVID-19 Pandemic (e.g. social distancing).  I verified, using two identifiers, that I am speaking with Dennis Spencer or their authorized healthcare agent. I discussed the limitations, risks, security, and privacy concerns of performing an evaluation and management service by telephone and the potential availability of an in-person appointment in the future. The patient expressed understanding and agreed to proceed.   Subjective:  Dennis Spencer is a 74 y.o. male patient of Stacks, Cletus Gash, MD who had a Medicare Annual Wellness Visit today via telephone. Dennis Spencer is retired and lives at home with his wife Dennis Spencer. His daughter and grandchildren are currently residing with them. They have a dog and a cat as pets. He has 3 children in total. He reports that he is socially active and does interact with friends/family regularly. He is not physically active and enjoys reading and photography.  Patient Care Team: Claretta Fraise, MD as PCP - General (Family Medicine) Ilean China, RN as Registered Nurse Alexis Frock, MD as Consulting Physician (Urology) Sydnee Cabal, MD as Consulting Physician (Orthopedic Surgery)  Advanced Directives 10/25/2019 01/26/2019 10/22/2018 10/19/2018 09/27/2018 08/05/2018 04/13/2018  Does Patient Have a Medical Advance Directive? No No No No No No No  Would patient like information on creating a medical advance directive? No - Patient declined - No - Patient declined - No - Patient declined - No - Patient declined  Pre-existing out of facility DNR order (yellow form or pink MOST form) - - - - - - -    Hospital Utilization Over the Past 12 Months: # of hospitalizations or ER visits: 0 # of surgeries: 2  Review of Systems    Patient reports that his overall health is worse compared to  last year.  History obtained from the patient and patient chart.   Patient Reported Readings (BP, Pulse, CBG, Weight, etc) none  Pain Assessment Pain : 0-10 Pain Score: 2  Pain Type: Chronic pain Pain Location: Knee Pain Orientation: Left, Right Pain Descriptors / Indicators: Aching Pain Onset: More than a month ago Pain Frequency: Constant Pain Relieving Factors: rest, advil Effect of Pain on Daily Activities: makes everything difficult  Pain Relieving Factors: rest, advil  Current Medications & Allergies (verified) Allergies as of 10/25/2019      Reactions   Lisinopril Other (See Comments)   DIZZINESS      Medication List       Accurate as of October 25, 2019 11:03 AM. If you have any questions, ask your nurse or doctor.        STOP taking these medications   Saxenda 18 MG/3ML Sopn Generic drug: Liraglutide -Weight Management     TAKE these medications   amLODipine 5 MG tablet Commonly known as: NORVASC TAKE 1 TABLET BY MOUTH EVERY DAY IN THE MORNING   atorvastatin 80 MG tablet Commonly known as: LIPITOR Take 1 tablet (80 mg total) by mouth daily.   b complex vitamins tablet Take 1 tablet by mouth daily.   Brilinta 60 MG Tabs tablet Generic drug: ticagrelor TAKE 1 TABLET (60 MG TOTAL) BY MOUTH 2 (TWO) TIMES DAILY.   CITRACAL + D PO Take 1 tablet by mouth 3 (three) times daily.   gabapentin 600 MG tablet Commonly known as: NEURONTIN Take 1 tablet (600 mg total) by mouth at bedtime.   multivitamin with minerals Tabs  tablet Take 1 tablet by mouth daily.   pantoprazole 40 MG tablet Commonly known as: PROTONIX Take 1 tablet (40 mg total) by mouth daily. For stomach   vitamin C 500 MG tablet Commonly known as: ASCORBIC ACID Take 500 mg by mouth 2 (two) times daily.   Vitamin D3 50 MCG (2000 UT) capsule Take 2,000 Units by mouth 2 (two) times daily.       History (reviewed): Past Medical History:  Diagnosis Date  . Anticoagulant long-term use     brilinta  . Arthritis   . Carotid stenosis, bilateral    per duplex 04-23-2018  bilateral ICA <50%  . Chronic cough    per pt this normal due to tracheostomy  . CKD (chronic kidney disease), stage III 11/10/2013  . GERD (gastroesophageal reflux disease)   . History of CVA (cerebrovascular accident) 04/13/2018   chronic lacunar infarct--- per pt no residuals  . History of kidney stones    multiple kidney stones  . History of osteomyelitis age 50   LLE  . Hyperlipidemia   . Hypertension   . OSA (obstructive sleep apnea) study in epic 09-25-2005  severe osa   1981 s/p tracheostomy, tongue reduction and septoplasty;  LEAVES TRACH OPEN AT NIGHT FOR SLEEP APNEA  . Pneumonia   . 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 Renal calculi    bilateral  . Severe obesity (BMI >= 40) (HCC)   . Urgency of urination   . Wears glasses    Past Surgical History:  Procedure Laterality Date  . CHOLECYSTECTOMY N/A 06/15/2015   Procedure: LAPAROSCOPIC CHOLECYSTECTOMY;  Surgeon: Ralene Ok, MD;  Location: [facility];  Service: General;  Laterality: N/A;  . COLONOSCOPY  last one 03-28-2016  . CYSTOSCOPY W/ URETERAL STENT REMOVAL Right 06/26/2017   Procedure: CYSTOSCOPY WITH STENT REMOVAL;  Surgeon: Alexis Frock, MD;  Location: [facility];  Service: Urology;  Laterality: Right;  . CYSTOSCOPY W/ URETERAL STENT REMOVAL Bilateral 10/22/2018   Procedure: CYSTOSCOPY WITH STENT REMOVAL;  Surgeon: Alexis Frock, MD;  Location: [facility];  Service: Urology;  Laterality: Bilateral;  . CYSTOSCOPY WITH RETROGRADE PYELOGRAM, URETEROSCOPY AND STENT PLACEMENT Bilateral 06/18/2013   Procedure: CYSTOSCOPY WITH RETROGRADE PYELOGRAM, Left URETEROSCOPY with laser , AND bilateral STENT PLACEMENT;  Surgeon: Alexis Frock, MD;  Location[facility];  Service: Urology;  Laterality: Bilateral;  . CYSTOSCOPY WITH RETROGRADE PYELOGRAM, URETEROSCOPY AND STENT PLACEMENT Right  08/08/2013   Procedure: CYSTOSCOPY WITH RIGHT RETROGRADE PYELOGRAM, URETEROSCOPY AND STENT PLACEMENT, stone extraction;  Surgeon: Alexis Frock, MD;  [facility];  Service: Urology;  Laterality: Right;  . CYSTOSCOPY WITH RETROGRADE PYELOGRAM, URETEROSCOPY AND STENT PLACEMENT Right 08/10/2013   Procedure: 2ND STAGE CYSTOSCOPY WITH RETROGRADE PYELOGRAM, URETEROSCOPY AND STENT EXCHANGE;  Surgeon: Alexis Frock, MD;  [facility];  Service: Urology;  Laterality: Right;  . CYSTOSCOPY WITH RETROGRADE PYELOGRAM, URETEROSCOPY AND STENT PLACEMENT Bilateral 05/22/2017   Procedure: CYSTOSCOPY WITH RETROGRADE PYELOGRAM, URETEROSCOPY AND STENT PLACEMENT;  Surgeon: Alexis Frock, MD;  [facility];  Service: Urology;  Laterality: Bilateral;  . CYSTOSCOPY WITH RETROGRADE PYELOGRAM, URETEROSCOPY AND STENT PLACEMENT Bilateral 06/05/2017   Procedure: CYSTOSCOPY WITH RETROGRADE PYELOGRAM, URETEROSCOPY AND STENT PLACEMENT;  Surgeon: Alexis Frock, MD;  [facility];  Service: Urology;  Laterality: Bilateral;  . CYSTOSCOPY WITH RETROGRADE PYELOGRAM, URETEROSCOPY AND STENT PLACEMENT Bilateral 08/13/2018   Procedure: CYSTOSCOPY WITH RETROGRADE PYELOGRAM, URETEROSCOPY AND STENT PLACEMENT;  Surgeon: Alexis Frock, MD;  [facility];  Service: Urology;  Laterality: Bilateral;  90 MINS  . CYSTOSCOPY WITH RETROGRADE PYELOGRAM, URETEROSCOPY AND STENT PLACEMENT Bilateral 10/22/2018   Procedure: CYSTOSCOPY WITH RETROGRADE PYELOGRAM, URETEROSCOPY  STONE BASKETRY AND STENT PLACEMENT;  Surgeon: Alexis Frock, MD;  [facility];  Service: Urology;  Laterality: Bilateral;  . CYSTOSCOPY/URETEROSCOPY/HOLMIUM LASER/STENT PLACEMENT Left 06/26/2017   Procedure: CYSTOSCOPY/URETEROSCOPY third stage/HOLMIUM LASER/STENT PLACEMENT retrograde pylegram;  Surgeon: Alexis Frock, MD;  [facility];  Service: Urology;  Laterality: Left;  Marland Kitchen GASTRIC ROUX-EN-Y  2004    @[facility]   . HOLMIUM LASER APPLICATION Bilateral 08/14/9199     Procedure: HOLMIUM LASER APPLICATION;  Surgeon: Alexis Frock, MD;  [facility];  Service: Urology;  Laterality: Bilateral;  . HOLMIUM LASER APPLICATION Left 0/0/7121   Procedure: HOLMIUM LASER APPLICATION;  Surgeon: Alexis Frock, MD;  [facility];  Service: Urology;  Laterality: Left;  . HOLMIUM LASER APPLICATION Bilateral 9/75/8832   Procedure: HOLMIUM LASER APPLICATION;  Surgeon: Alexis Frock, MD;  [facility];  Service: Urology;  Laterality: Bilateral;  . HOLMIUM LASER APPLICATION Bilateral 09/12/9824   Procedure: HOLMIUM LASER APPLICATION;  Surgeon: Alexis Frock, MD;  [facility];  Service: Urology;  Laterality: Bilateral;  . HOLMIUM LASER APPLICATION Bilateral 08/25/8307   Procedure: HOLMIUM LASER APPLICATION;  Surgeon: Alexis Frock, MD;  [facility];  Service: Urology;  Laterality: Bilateral;  . NEPHROLITHOTOMY Left 08/08/2013   Procedure: 1ST STAGE NEPHROLITHOTOMY PERCUTANEOUS WITH SURGEON ACCESS;  Surgeon: Alexis Frock, MD;  [facility];  Service: Urology;  Laterality: Left;  . NEPHROLITHOTOMY Left 08/10/2013   Procedure: NEPHROLITHOTOMY PERCUTANEOUS SECOND LOOK/LEFT DIGITAL URETEROSCOPY/BASKETING OF STONE/EXCHANGE OF LEFT URETERAL STENT;  Surgeon: Alexis Frock, MD;  Location[facility];  Service: Urology;  Laterality: Left;  . ORCHIOPEXY  child   unlateral undescended testis  . PERCUTANEOUS NEPHROSTOLITHOTOMY Left 09-19-2009   dr Diona Fanti  @[facility]   . TONSILLECTOMY  child  . TRACHEOSTOMY  1987   s/p tongue reduction and nasal septum deviation repair for  Sleep apnea   . URETEROSCOPY WITH HOLMIUM LASER LITHOTRIPSY Left 04-14-2002   dr Diona Fanti  @[facility]   . WISDOM TOOTH EXTRACTION     Family History  Problem Relation Age of Onset  . Cancer Mother        Ovarian, died age 68.  . Diabetes Father   . Hypertension Father   . Heart failure Father        Died of MI age 74.  . Breast cancer Maternal Grandmother   . Colon cancer Maternal Grandfather   .  Esophageal cancer Neg Hx   . Stomach cancer Neg Hx   . Rectal cancer Neg Hx    Social History   Socioeconomic History  . Marital status: Married    Spouse name: Dennis Spencer  . Number of children: 3  . Years of education: Not on file  . Highest education level: Master's degree (e.g., MA, MS, MEng, MEd, MSW, MBA)  Occupational History  . Occupation: Retired, English as a second language teacher.    Employer: [employer]  Tobacco Use  . Smoking status: Former Smoker    Packs/day: 1.00    Years: 8.00    Pack years: 8.00    Types: Cigarettes    Quit date: 05/12/1968    Years since quitting: 51.4  . Smokeless tobacco: Never Used  Vaping Use  . Vaping Use: Never used  Substance and Sexual Activity  . Alcohol use: Not Currently  . Drug use: Never  .  Sexual activity: Yes    Birth control/protection: None  Other Topics Concern  . Not on file  Social History Narrative   Married.     Social Determinants of Health   Financial Resource Strain:   . Difficulty of Paying Living Expenses:   Food Insecurity:   . Worried About Charity fundraiser in the Last Year:   . Arboriculturist in the Last Year:   Transportation Needs:   . Film/video editor (Medical):   Marland Kitchen Lack of Transportation (Non-Medical):   Physical Activity:   . Days of Exercise per Week:   . Minutes of Exercise per Session:   Stress:   . Feeling of Stress :   Social Connections:   . Frequency of Communication with Friends and Family:   . Frequency of Social Gatherings with Friends and Family:   . Attends Religious Services:   . Active Member of Clubs or Organizations:   . Attends Archivist Meetings:   Marland Kitchen Marital Status:     Activities of Daily Living In your present state of health, do you have any difficulty performing the following activities: 10/25/2019 12/09/2018  Hearing? N N  Vision? N N  Difficulty concentrating or making decisions? N N  Walking or climbing stairs? Y Y  Comment knee pain -  Dressing or bathing? N N    Doing errands, shopping? N N  Preparing Food and eating ? N -  Using the Toilet? N -  In the past six months, have you accidently leaked urine? N -  Do you have problems with loss of bowel control? N -  Managing your Medications? N -  Managing your Finances? N -  Housekeeping or managing your Housekeeping? N -  Some recent data might be hidden    Patient Education/ Literacy How often do you need to have someone help you when you read instructions, pamphlets, or other written materials from your doctor or pharmacy?: 1 - Never What is the last grade level you completed in school?: masters degree  Exercise Current Exercise Habits: The patient does not participate in regular exercise at present, Exercise limited by: orthopedic condition(s) (two bad knees, in need of knee replacement)  Diet Patient reports consuming 2 meals a day and 1 snack(s) a day Patient reports that his primary diet is: Regular Patient reports that she does have regular access to food.   Depression Screen PHQ 2/9 Scores 10/25/2019 05/02/2019 09/27/2018 05/28/2018 04/19/2018 04/09/2018 04/02/2018  PHQ - 2 Score 0 0 2 2 0 0 1  PHQ- 9 Score - - 3 4 - - 1     Fall Risk Fall Risk  10/25/2019 05/02/2019 12/22/2018 09/27/2018 04/19/2018  Falls in the past year? 0 0 - 0 0  Number falls in past yr: - - - - -  Injury with Fall? - - - - -  Risk for fall due to : - - Impaired mobility - -     Objective:  Dennis Spencer seemed alert and oriented and he participated appropriately during our telephone visit.  Blood Pressure Weight BMI  BP Readings from Last 3 Encounters:  05/02/19 140/74  10/22/18 (!) 141/71  10/19/18 138/61   Wt Readings from Last 3 Encounters:  05/02/19 (!) 329 lb (149.2 kg)  10/22/18 (!) 324 lb 14.4 oz (147.4 kg)  10/19/18 (!) 324 lb 14.4 oz (147.4 kg)   BMI Readings from Last 1 Encounters:  05/02/19 47.21 kg/m    *Unable  to obtain current vital signs, weight, and BMI due to telephone visit  type  Hearing/Vision  . Sailor did not seem to have difficulty with hearing/understanding during the telephone conversation . Reports that he has not had a formal eye exam by an eye care professional within the past year . Reports that he has not had a formal hearing evaluation within the past year *Unable to fully assess hearing and vision during telephone visit type  Cognitive Function: 6CIT Screen 10/25/2019 09/27/2018  What Year? 0 points 0 points  What month? 0 points 0 points  What time? 0 points 0 points  Count back from 20 0 points 0 points  Months in reverse 0 points 0 points  Repeat phrase 0 points 0 points  Total Score 0 0   (Normal:0-7, Significant for Dysfunction: >8)  Normal Cognitive Function Screening: Yes   Immunization & Health Maintenance Record Immunization History  Administered Date(s) Administered  . Fluad Quad(high Dose 65+) 05/02/2019  . Influenza,inj,Quad PF,6+ Mos 06/20/2013, 02/11/2017, 02/09/2018  . Influenza-Unspecified 07/26/2015  . Moderna SARS-COVID-2 Vaccination 06/28/2019, 07/26/2019  . Pneumococcal Conjugate-13 01/08/2016  . Pneumococcal Polysaccharide-23 08/12/2017    Health Maintenance  Topic Date Due  . Hepatitis C Screening  Never done  . TETANUS/TDAP  Never done  . INFLUENZA VACCINE  12/11/2019  . COLONOSCOPY  03/28/2021  . COVID-19 Vaccine  Completed  . PNA vac Low Risk Adult  Completed       Assessment  This is a routine wellness examination for Dennis Spencer.  Health Maintenance: Due or Overdue Health Maintenance Due  Topic Date Due  . Hepatitis C Screening  Never done  . TETANUS/TDAP  Never done    Dennis Spencer does not need a referral for Community Assistance: Care Management:   no Social Work:    no Prescription Assistance:  no Nutrition/Diabetes Education: yes   Plan:  Personalized Goals Goals Addressed            This Visit's Progress   . Patient Stated       10/25/2019 AWV Goal: Keep All  Scheduled Appointments  Over the next year, patient will attend all scheduled appointments with their PCP and any specialists that they see.       Personalized Health Maintenance & Screening Recommendations  Td vaccine  Lung Cancer Screening Recommended: no (Low Dose CT Chest recommended if Age 69-80 years, 30 pack-year currently smoking OR have quit w/in past 15 years) Hepatitis C Screening recommended: yes HIV Screening recommended: no  Advanced Directives: Written information was not prepared per patient's request.  Referrals & Orders No orders of the defined types were placed in this encounter.   Follow-up Plan . Follow-up with Claretta Fraise, MD as planned   I have personally reviewed and noted the following in the patient's chart:   . Medical and social history . Use of alcohol, tobacco or illicit drugs  . Current medications and supplements . Functional ability and status . Nutritional status . Physical activity . Advanced directives . List of other physicians . Hospitalizations, surgeries, and ER visits in previous 12 months . Vitals . Screenings to include cognitive, depression, and falls . Referrals and appointments  In addition, I have reviewed and discussed with Dennis Spencer certain preventive protocols, quality metrics, and best practice recommendations. A written personalized care plan for preventive services as well as general preventive health recommendations is available and can be mailed to the patient at his request.  Baldomero Lamy, LPN 1/79/8102

## 2019-10-27 ENCOUNTER — Other Ambulatory Visit: Payer: Self-pay | Admitting: Family Medicine

## 2019-10-27 DIAGNOSIS — G8929 Other chronic pain: Secondary | ICD-10-CM

## 2019-10-27 NOTE — Telephone Encounter (Signed)
I sent in referral. Please do a handicap form and I will sign.

## 2019-10-27 NOTE — Telephone Encounter (Signed)
Handicap form filled out

## 2019-11-03 ENCOUNTER — Telehealth: Payer: Self-pay | Admitting: Family Medicine

## 2019-11-03 NOTE — Telephone Encounter (Signed)
FYI: Called pt to let him know that his Handicap form has been completed and is in the drawer at front, ready to be picked up. Pt confirmed that he will come by office to pick up.

## 2019-11-07 DIAGNOSIS — N5201 Erectile dysfunction due to arterial insufficiency: Secondary | ICD-10-CM | POA: Diagnosis not present

## 2019-11-07 DIAGNOSIS — R82992 Hyperoxaluria: Secondary | ICD-10-CM | POA: Diagnosis not present

## 2019-11-07 DIAGNOSIS — N200 Calculus of kidney: Secondary | ICD-10-CM | POA: Diagnosis not present

## 2019-11-09 ENCOUNTER — Other Ambulatory Visit: Payer: Self-pay | Admitting: Urology

## 2019-12-01 ENCOUNTER — Other Ambulatory Visit: Payer: Self-pay | Admitting: Family Medicine

## 2019-12-01 DIAGNOSIS — I10 Essential (primary) hypertension: Secondary | ICD-10-CM

## 2019-12-01 NOTE — Telephone Encounter (Signed)
30 day supply given today  ntbs for further refills

## 2019-12-03 ENCOUNTER — Other Ambulatory Visit: Payer: Self-pay | Admitting: Family Medicine

## 2019-12-13 DIAGNOSIS — H43813 Vitreous degeneration, bilateral: Secondary | ICD-10-CM | POA: Diagnosis not present

## 2019-12-13 DIAGNOSIS — H2513 Age-related nuclear cataract, bilateral: Secondary | ICD-10-CM | POA: Diagnosis not present

## 2019-12-13 DIAGNOSIS — Z0100 Encounter for examination of eyes and vision without abnormal findings: Secondary | ICD-10-CM | POA: Diagnosis not present

## 2019-12-13 DIAGNOSIS — E7800 Pure hypercholesterolemia, unspecified: Secondary | ICD-10-CM | POA: Diagnosis not present

## 2019-12-13 DIAGNOSIS — I10 Essential (primary) hypertension: Secondary | ICD-10-CM | POA: Diagnosis not present

## 2019-12-13 DIAGNOSIS — H5210 Myopia, unspecified eye: Secondary | ICD-10-CM | POA: Diagnosis not present

## 2019-12-14 NOTE — Patient Instructions (Addendum)
DUE TO COVID-19 ONLY ONE VISITOR IS ALLOWED TO COME WITH YOU AND STAY IN THE WAITING ROOM ONLY DURING PRE OP AND PROCEDURE DAY OF SURGERY. THE 2 VISITORS MAY VISIT WITH YOU AFTER SURGERY IN YOUR PRIVATE ROOM DURING VISITING HOURS ONLY!  YOU NEED TO HAVE A COVID 19 TEST ON__8/10_____ @__11 :35_____, THIS TEST MUST BE DONE BEFORE SURGERY,  COVID TESTING SITE [address] [place] [address], IT IS ON THE RIGHT GOING OUT [address] APPROXITAMELTELY 2 MINUTES PAST [employer] ON THE RIGHT. ONCE YOUR COVID TEST IS COMPLETED,  PLEASE BEGIN THE QUARANTINE INSTRUCTIONS AS OUTLINED IN YOUR HANDOUT.                [name]    Your procedure is scheduled on: 12/23/19   Report to [facility] Main  Entrance   Report to admitting at   12:00 PM     Call this number if you have problems the morning of surgery [phone]    Remember: Do not eat food or drink liquids :After Midnight.   BRUSH YOUR TEETH MORNING OF SURGERY AND RINSE YOUR MOUTH OUT, NO CHEWING GUM CANDY OR MINTS.     Take these medicines the morning of surgery with A SIP OF WATER: Amlodipine, Protonix                                You may not have any metal on your body including              piercings  Do not wear jewelry,lotions, powders or  deodorant                          Men may shave face and neck.   Do not bring valuables to the hospital. [place].  Contacts, dentures or bridgework may not be worn into surgery.  Leave suitcase in the car. After surgery it may be brought to your room.     Patients discharged the day of surgery will not be allowed to drive home.   IF YOU ARE HAVING SURGERY AND GOING HOME THE SAME DAY, YOU MUST HAVE AN ADULT TO DRIVE YOU HOME AND BE WITH YOU FOR 24 HOURS.   YOU MAY GO HOME BY TAXI OR UBER OR ORTHERWISE, BUT AN ADULT MUST ACCOMPANY YOU HOME AND STAY WITH YOU FOR 24 HOURS.  Name and phone number of  your driver:  Special Instructions: N/A              Please read over the following fact sheets you were given: _____________________________________________________________________             [facility] - Preparing for Surgery Before surgery, you can play an important role.   Because skin is not sterile, your skin needs to be as free of germs as possible.   You can reduce the number of germs on your skin by washing with CHG (chlorahexidine gluconate) soap before surgery.  CHG is an antiseptic cleaner which kills germs and bonds with the skin to continue killing germs even after washing. Please DO NOT use if you have an allergy to CHG or antibacterial soaps.   If your skin becomes reddened/irritated stop using the CHG and inform your nurse when you arrive  at Short Stay.   You may shave your face/neck.  Please follow these instructions carefully:  1.  Shower with CHG Soap the night before surgery and the  morning of Surgery.  2.  If you choose to wash your hair, wash your hair first as usual with your  normal  shampoo.  3.  After you shampoo, rinse your hair and body thoroughly to remove the  shampoo.                                        4.  Use CHG as you would any other liquid soap.  You can apply chg directly  to the skin and wash                       Gently with a scrungie or clean washcloth.  5.  Apply the CHG Soap to your body ONLY FROM THE NECK DOWN.   Do not use on face/ open                           Wound or open sores. Avoid contact with eyes, ears mouth and genitals (private parts).                       Wash face,  Genitals (private parts) with your normal soap.             6.  Wash thoroughly, paying special attention to the area where your surgery  will be performed.  7.  Thoroughly rinse your body with warm water from the neck down.  8.  DO NOT shower/wash with your normal soap after using and rinsing off  the CHG Soap.             9.  Pat yourself dry with a clean  towel.            10.  Wear clean pajamas.            11.  Place clean sheets on your bed the night of your first shower and do not  sleep with pets. Day of Surgery : Do not apply any lotions/deodorants the morning of surgery.  Please wear clean clothes to the hospital/surgery center.  FAILURE TO FOLLOW THESE INSTRUCTIONS MAY RESULT IN THE CANCELLATION OF YOUR SURGERY PATIENT SIGNATURE_________________________________  NURSE SIGNATURE__________________________________  ________________________________________________________________________

## 2019-12-16 ENCOUNTER — Other Ambulatory Visit: Payer: Self-pay

## 2019-12-16 ENCOUNTER — Encounter (HOSPITAL_COMMUNITY): Payer: Self-pay

## 2019-12-16 ENCOUNTER — Encounter (HOSPITAL_COMMUNITY)
Admission: RE | Admit: 2019-12-16 | Discharge: 2019-12-16 | Disposition: A | Discharge status: Home / Self Care | Payer: Medicare HMO | Source: Ambulatory Visit | Attending: Urology | Admitting: Urology

## 2019-12-16 DIAGNOSIS — N202 Calculus of kidney with calculus of ureter: Secondary | ICD-10-CM | POA: Diagnosis not present

## 2019-12-16 DIAGNOSIS — Z01818 Encounter for other preprocedural examination: Secondary | ICD-10-CM | POA: Diagnosis not present

## 2019-12-16 DIAGNOSIS — I10 Essential (primary) hypertension: Secondary | ICD-10-CM | POA: Insufficient documentation

## 2019-12-16 HISTORY — DX: Dyspnea, unspecified: R06.00

## 2019-12-16 LAB — BASIC METABOLIC PANEL
Anion gap: 7 (ref 5–15)
BUN: 14 mg/dL (ref 8–23)
CO2: 27 mmol/L (ref 22–32)
Calcium: 8.7 mg/dL — ABNORMAL LOW (ref 8.9–10.3)
Chloride: 103 mmol/L (ref 98–111)
Creatinine, Ser: 1.36 mg/dL — ABNORMAL HIGH (ref 0.61–1.24)
GFR calc Af Amer: 59 mL/min — ABNORMAL LOW (ref >60)
GFR calc non Af Amer: 51 mL/min — ABNORMAL LOW (ref >60)
Glucose, Bld: 160 mg/dL — ABNORMAL HIGH (ref 70–99)
Potassium: 4.9 mmol/L (ref 3.5–5.1)
Sodium: 137 mmol/L (ref 135–145)

## 2019-12-16 LAB — CBC
HCT: 38.8 % — ABNORMAL LOW (ref 39.0–52.0)
Hemoglobin: 12.5 g/dL — ABNORMAL LOW (ref 13.0–17.0)
MCH: 28.6 pg (ref 26.0–34.0)
MCHC: 32.2 g/dL (ref 30.0–36.0)
MCV: 88.8 fL (ref 80.0–100.0)
Platelets: 259 10*3/uL (ref 150–400)
RBC: 4.37 MIL/uL (ref 4.22–5.81)
RDW: 15.6 % — ABNORMAL HIGH (ref 11.5–15.5)
WBC: 5.5 10*3/uL (ref 4.0–10.5)
nRBC: 0 % (ref 0.0–0.2)

## 2019-12-16 NOTE — Progress Notes (Signed)
COVID Vaccine Completed:Yes Date COVID Vaccine completed:07/26/19 COVID vaccine manufacturer: Moderna    PCP - Dr. Cherrie Distance Cardiologist - no  Chest x-ray - no EKG - 12/16/19 Stress Test - no ECHO - 2015 Cardiac Cath - no  Sleep Study - yes CPAP - no. Pt had a tracheostomy and tougue reduction surgery to treat his sleep apnea  Fasting Blood Sugar - NA Checks Blood Sugar _____ times a day  Blood Thinner Instructions:Ticagelor/ Stacks Aspirin Instructions:continue/ Manny Last Dose:  Anesthesia review:   Patient denies shortness of breath, fever, cough and chest pain at PAT appointment Yes   Patient verbalized understanding of instructions that were given to them at the PAT appointment. Patient was also instructed that they will need to review over the PAT instructions again at home before surgery. Yes  Pt has a BMI 48.35. He is SOB with most activities. He doesn't move around alot because of knee pain.

## 2019-12-20 ENCOUNTER — Other Ambulatory Visit (HOSPITAL_COMMUNITY)
Admission: RE | Admit: 2019-12-20 | Discharge: 2019-12-20 | Disposition: A | Discharge status: Home / Self Care | Payer: Medicare HMO | Source: Ambulatory Visit | Attending: Urology | Admitting: Urology

## 2019-12-20 DIAGNOSIS — Z01812 Encounter for preprocedural laboratory examination: Secondary | ICD-10-CM | POA: Insufficient documentation

## 2019-12-20 DIAGNOSIS — Z20822 Contact with and (suspected) exposure to covid-19: Secondary | ICD-10-CM | POA: Insufficient documentation

## 2019-12-20 LAB — SARS CORONAVIRUS 2 (TAT 6-24 HRS): SARS Coronavirus 2: NEGATIVE

## 2019-12-22 MED ORDER — GENTAMICIN SULFATE 40 MG/ML IJ SOLN
530.0000 mg | INTRAVENOUS | Status: AC
  Administered 2019-12-23: 530 mg via INTRAVENOUS
  Filled 2019-12-22: qty 13.25

## 2019-12-23 ENCOUNTER — Encounter (HOSPITAL_COMMUNITY): Payer: Self-pay | Admitting: Urology

## 2019-12-23 ENCOUNTER — Ambulatory Visit (HOSPITAL_COMMUNITY)
Admission: RE | Admit: 2019-12-23 | Discharge: 2019-12-23 | Disposition: A | Discharge status: Home / Self Care | Payer: Medicare HMO | Attending: Urology | Admitting: Urology

## 2019-12-23 ENCOUNTER — Ambulatory Visit (HOSPITAL_COMMUNITY): Payer: Medicare HMO

## 2019-12-23 ENCOUNTER — Ambulatory Visit (HOSPITAL_COMMUNITY): Payer: Medicare HMO | Admitting: Physician Assistant

## 2019-12-23 ENCOUNTER — Encounter (HOSPITAL_COMMUNITY)
Admission: RE | Disposition: A | Discharge status: Home / Self Care | Payer: Self-pay | Source: Home / Self Care | Attending: Urology

## 2019-12-23 ENCOUNTER — Ambulatory Visit (HOSPITAL_COMMUNITY): Payer: Medicare HMO | Admitting: Anesthesiology

## 2019-12-23 DIAGNOSIS — K219 Gastro-esophageal reflux disease without esophagitis: Secondary | ICD-10-CM | POA: Insufficient documentation

## 2019-12-23 DIAGNOSIS — N210 Calculus in bladder: Secondary | ICD-10-CM | POA: Insufficient documentation

## 2019-12-23 DIAGNOSIS — Z930 Tracheostomy status: Secondary | ICD-10-CM | POA: Insufficient documentation

## 2019-12-23 DIAGNOSIS — N183 Chronic kidney disease, stage 3 unspecified: Secondary | ICD-10-CM | POA: Insufficient documentation

## 2019-12-23 DIAGNOSIS — G4733 Obstructive sleep apnea (adult) (pediatric): Secondary | ICD-10-CM | POA: Diagnosis not present

## 2019-12-23 DIAGNOSIS — Z8673 Personal history of transient ischemic attack (TIA), and cerebral infarction without residual deficits: Secondary | ICD-10-CM | POA: Insufficient documentation

## 2019-12-23 DIAGNOSIS — M1990 Unspecified osteoarthritis, unspecified site: Secondary | ICD-10-CM | POA: Diagnosis not present

## 2019-12-23 DIAGNOSIS — I129 Hypertensive chronic kidney disease with stage 1 through stage 4 chronic kidney disease, or unspecified chronic kidney disease: Secondary | ICD-10-CM | POA: Diagnosis not present

## 2019-12-23 DIAGNOSIS — Z87442 Personal history of urinary calculi: Secondary | ICD-10-CM | POA: Insufficient documentation

## 2019-12-23 DIAGNOSIS — N200 Calculus of kidney: Secondary | ICD-10-CM | POA: Insufficient documentation

## 2019-12-23 DIAGNOSIS — Z87891 Personal history of nicotine dependence: Secondary | ICD-10-CM | POA: Diagnosis not present

## 2019-12-23 DIAGNOSIS — R82992 Hyperoxaluria: Secondary | ICD-10-CM | POA: Diagnosis not present

## 2019-12-23 DIAGNOSIS — R82991 Hypocitraturia: Secondary | ICD-10-CM | POA: Diagnosis not present

## 2019-12-23 DIAGNOSIS — Z7901 Long term (current) use of anticoagulants: Secondary | ICD-10-CM | POA: Insufficient documentation

## 2019-12-23 DIAGNOSIS — Z6841 Body Mass Index (BMI) 40.0 and over, adult: Secondary | ICD-10-CM | POA: Diagnosis not present

## 2019-12-23 DIAGNOSIS — Z9884 Bariatric surgery status: Secondary | ICD-10-CM | POA: Insufficient documentation

## 2019-12-23 DIAGNOSIS — E785 Hyperlipidemia, unspecified: Secondary | ICD-10-CM | POA: Insufficient documentation

## 2019-12-23 HISTORY — PX: CYSTOSCOPY WITH RETROGRADE PYELOGRAM, URETEROSCOPY AND STENT PLACEMENT: SHX5789

## 2019-12-23 HISTORY — PX: CYSTOSCOPY WITH LITHOLAPAXY: SHX1425

## 2019-12-23 HISTORY — PX: HOLMIUM LASER APPLICATION: SHX5852

## 2019-12-23 SURGERY — CYSTOURETEROSCOPY, WITH RETROGRADE PYELOGRAM AND STENT INSERTION
Anesthesia: General

## 2019-12-23 MED ORDER — IOHEXOL 300 MG/ML  SOLN
INTRAMUSCULAR | Status: DC | PRN
  Administered 2019-12-23: 36 mL

## 2019-12-23 MED ORDER — LIDOCAINE 2% (20 MG/ML) 5 ML SYRINGE
INTRAMUSCULAR | Status: DC | PRN
  Administered 2019-12-23: 100 mg via INTRAVENOUS

## 2019-12-23 MED ORDER — EPHEDRINE 5 MG/ML INJ
INTRAVENOUS | Status: AC
  Filled 2019-12-23: qty 10

## 2019-12-23 MED ORDER — ONDANSETRON HCL 4 MG/2ML IJ SOLN: 4.0000 mg | Freq: Once | INTRAMUSCULAR | Status: DC | PRN

## 2019-12-23 MED ORDER — 0.9 % SODIUM CHLORIDE (POUR BTL) OPTIME
TOPICAL | Status: DC | PRN
  Administered 2019-12-23: 2000 mL

## 2019-12-23 MED ORDER — ONDANSETRON HCL 4 MG/2ML IJ SOLN
INTRAMUSCULAR | Status: DC | PRN
  Administered 2019-12-23: 4 mg via INTRAVENOUS

## 2019-12-23 MED ORDER — SENNOSIDES-DOCUSATE SODIUM 8.6-50 MG PO TABS
1.0000 | ORAL_TABLET | Freq: Two times a day (BID) | ORAL | 0 refills | Status: DC
Start: 2019-12-23 — End: 2020-01-31

## 2019-12-23 MED ORDER — PROPOFOL 10 MG/ML IV BOLUS
INTRAVENOUS | Status: AC
  Filled 2019-12-23: qty 20

## 2019-12-23 MED ORDER — EPHEDRINE SULFATE-NACL 50-0.9 MG/10ML-% IV SOSY
PREFILLED_SYRINGE | INTRAVENOUS | Status: DC | PRN
  Administered 2019-12-23 (×2): 5 mg via INTRAVENOUS

## 2019-12-23 MED ORDER — FENTANYL CITRATE (PF) 100 MCG/2ML IJ SOLN
INTRAMUSCULAR | Status: AC
  Filled 2019-12-23: qty 2

## 2019-12-23 MED ORDER — FENTANYL CITRATE (PF) 100 MCG/2ML IJ SOLN: 25.0000 ug | INTRAMUSCULAR | Status: DC | PRN

## 2019-12-23 MED ORDER — OXYCODONE-ACETAMINOPHEN 5-325 MG PO TABS: 1.0000 | ORAL_TABLET | Freq: Four times a day (QID) | ORAL | 0 refills | Status: DC | PRN

## 2019-12-23 MED ORDER — ORAL CARE MOUTH RINSE: 15.0000 mL | Freq: Once | OROMUCOSAL | Status: AC

## 2019-12-23 MED ORDER — LACTATED RINGERS IV SOLN: INTRAVENOUS | Status: DC

## 2019-12-23 MED ORDER — SODIUM CHLORIDE 0.9 % IR SOLN
Status: DC | PRN
  Administered 2019-12-23: 6000 mL

## 2019-12-23 MED ORDER — LIDOCAINE 2% (20 MG/ML) 5 ML SYRINGE
INTRAMUSCULAR | Status: AC
  Filled 2019-12-23: qty 5

## 2019-12-23 MED ORDER — HYDROGEN PEROXIDE 3 % EX SOLN
CUTANEOUS | Status: AC
  Filled 2019-12-23: qty 473

## 2019-12-23 MED ORDER — FENTANYL CITRATE (PF) 100 MCG/2ML IJ SOLN
INTRAMUSCULAR | Status: DC | PRN
  Administered 2019-12-23 (×2): 25 ug via INTRAVENOUS

## 2019-12-23 MED ORDER — SODIUM CHLORIDE (PF) 0.9 % IJ SOLN
INTRAMUSCULAR | Status: AC
  Filled 2019-12-23: qty 20

## 2019-12-23 MED ORDER — KETOROLAC TROMETHAMINE 10 MG PO TABS: 10.0000 mg | ORAL_TABLET | Freq: Three times a day (TID) | ORAL | 0 refills | Status: DC | PRN

## 2019-12-23 MED ORDER — ONDANSETRON HCL 4 MG/2ML IJ SOLN
INTRAMUSCULAR | Status: AC
  Filled 2019-12-23: qty 2

## 2019-12-23 MED ORDER — CHLORHEXIDINE GLUCONATE 0.12 % MT SOLN
15.0000 mL | Freq: Once | OROMUCOSAL | Status: AC
  Administered 2019-12-23: 15 mL via OROMUCOSAL

## 2019-12-23 MED ORDER — PROPOFOL 10 MG/ML IV BOLUS
INTRAVENOUS | Status: DC | PRN
  Administered 2019-12-23: 200 mg via INTRAVENOUS

## 2019-12-23 SURGICAL SUPPLY — 33 items
BAG URO CATCHER STRL LF (MISCELLANEOUS) ×4 IMPLANT
BASKET LASER NITINOL 1.9FR (BASKET) ×2 IMPLANT
BSKT STON RTRVL 120 1.9FR (BASKET) ×2
CATH INTERMIT  6FR 70CM (CATHETERS) ×6 IMPLANT
CATH UROLOGY TORQUE 40 (MISCELLANEOUS) ×3 IMPLANT
CLOTH BEACON ORANGE TIMEOUT ST (SAFETY) ×4 IMPLANT
COVER WAND RF STERILE (DRAPES) IMPLANT
EXTRACTOR STONE 1.7FRX115CM (UROLOGICAL SUPPLIES) IMPLANT
GLOVE BIOGEL M STRL SZ7.5 (GLOVE) ×4 IMPLANT
GOWN STRL REUS W/TWL LRG LVL3 (GOWN DISPOSABLE) ×4 IMPLANT
GUIDEWIRE ANG ZIPWIRE 038X150 (WIRE) ×6 IMPLANT
GUIDEWIRE STR DUAL SENSOR (WIRE) ×7 IMPLANT
HYDROGEN PEROXIDE 16OZ (MISCELLANEOUS) ×2 IMPLANT
KIT TURNOVER KIT A (KITS) IMPLANT
LASER FIB FLEXIVA PULSE ID 365 (Laser) IMPLANT
LASER FIB FLEXIVA PULSE ID 550 (Laser) ×4 IMPLANT
LASER FIB FLEXIVA PULSE ID 910 (Laser) ×1 IMPLANT
MANIFOLD NEPTUNE II (INSTRUMENTS) ×4 IMPLANT
PACK CYSTO (CUSTOM PROCEDURE TRAY) ×4 IMPLANT
PENCIL SMOKE EVACUATOR (MISCELLANEOUS) IMPLANT
SHEATH URETERAL 12FRX28CM (UROLOGICAL SUPPLIES) IMPLANT
SHEATH URETERAL 12FRX35CM (MISCELLANEOUS) IMPLANT
STENT POLARIS 5FRX26 (STENTS) ×2 IMPLANT
SYR 10ML LL (SYRINGE) ×2 IMPLANT
SYR TOOMEY IRRIG 70ML (MISCELLANEOUS)
SYRINGE TOOMEY IRRIG 70ML (MISCELLANEOUS) IMPLANT
TAPE UMBILICAL COTTON 1/8X30 (MISCELLANEOUS) ×3 IMPLANT
TRACTIP FLEXIVA PULS ID 200XHI (Laser) IMPLANT
TRACTIP FLEXIVA PULSE ID 200 (Laser)
TUBE FEEDING 8FR 16IN STR KANG (MISCELLANEOUS) ×4 IMPLANT
TUBING CONNECTING 10 (TUBING) ×3 IMPLANT
TUBING CONNECTING 10' (TUBING) ×1
TUBING UROLOGY SET (TUBING) ×4 IMPLANT

## 2019-12-23 NOTE — Anesthesia Procedure Notes (Signed)
Procedure Name: Intubation Date/Time: 12/23/2019 12:00 PM Performed by: Sharlette Dense, CRNA Patient Re-evaluated:Patient Re-evaluated prior to induction Oxygen Delivery Method: Circle system utilized Preoxygenation: Pre-oxygenation with 100% oxygen Induction Type: IV induction Tube type: Reinforced Tube size: 6.0 mm Number of attempts: 1 Placement Confirmation: positive ETCO2 and breath sounds checked- equal and bilateral Tube secured with: Tape Comments: Trach tube removed and replaced with 6.0 Reinforced endotracheal tube per Dr. Royce Macadamia

## 2019-12-23 NOTE — Op Note (Signed)
NAME: Dennis Spencer, [place] MEDICAL RECORD [id number] ACCOUNT [id number] DATE OF BIRTH:[birth year] FACILITY: [facility] LOCATION: [facility] PHYSICIAN:Alexus Galka Tresa Moore, MD  OPERATIVE REPORT  DATE OF PROCEDURE:  12/23/2019  SURGEON:  Alexis Frock, MD  PREOPERATIVE DIAGNOSIS:  Recurrent bilateral renal stones and large bladder stone.  PROCEDURE: 1.  Cystolitholapaxy, stone greater than 2 cm. 2.  First stage right ureteroscopy with basketing of stones. 3.  First stage left ureteroscopy with basketing of stones. 4.  Bilateral retrograde pyelograms, interpretation.   5.  Insertion of ureteral stent 5 x 26 Polaris, no tether.  ESTIMATED BLOOD LOSS:  Nil.  COMPLICATIONS:  None.  SPECIMEN:  Bilateral renal and bladder stone fragments for discard.  FINDINGS: 1.  Large volume bladder stone, estimated total volume approximately 3.5 cm. 2.  Left greater than right renal stones. 3.  Successful removal of the vast majority of right renal stones. 4.  Partial removal of left renal stones, angulation somewhat poor without pre-stenting. 5.  Successful placement of bilateral ureteral stents, proximal end in renal pelvis, distal end in urinary bladder.  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  Also, status post gastric bypass.  He has very high risk urolithiasis with rapid recurrences,  undergoing procedures approximately yearly for recurrent renal and bladder stones despite beta blocker directed therapy.  He was found on surveillance imaging to have significant recurrent stones, mostly bladder, but some renal stones as well and given  the volume of bladder stone, options were discussed including observation versus definitive management with cystolitholapaxy and concomitant ureteroscopy with goal of stone free and he wished to proceed.  Given the volume of stone disease that he has had  in the  past and at present, it was felt that likely a staged approach would be warranted and he presents for first stage procedure today.  Informed consent was obtained and placed in medical record.  DESCRIPTION OF PROCEDURE:  The patient  being identified, the procedure being bilateral first-stage ureteroscopic manipulation, cystolitholapaxy was confirmed.  Procedure timeout was performed.  Intravenous antibiotics were administered.  General  anesthesia introduced via his tracheostomy site.  The patient was placed into a low lithotomy position.  Sterile field was created, prepping and draping his penis, perineum and proximal thighs using iodine.  His penis was somewhat buried as per baseline.   Cystourethroscopy was performed using 21-French rigid cystoscope with offset lens.  Inspection of the anterior and posterior urethra was normal.  Urinary bladder revealed large volume intravesical stones, a single stone approximately 1 cm in size and  another approximately 3 cm in size.  These were much too large for simple retrieval.  As such, initial lithotripsy technique was performed using the rigid stone crusher device under direct visualization . This allowed excellent fragmentation of the  smaller 1 cm stone.  However, the larger stone was too large for the jaws to even obtain a grip on.  As such, the stone crusher device was exchanged for a 26-French resectoscope sheath with visual obturator and preparations were made for laser  lithotripsy.  An open-ended catheter was used for laser fiber stabilization and a 550 nanometer fiber was used with settings of 0.5 joules and 20 Hz, and a dusting technique was used on the larger stone.  The stone was dusted for approximately 60% of its  volume, the remaining 40% fragmented and using resectoscope sheath, these fragments were irrigated and set aside  for discard.  The resectoscope was once again exchanged for the 21-French cystoscope.  Attention was directed at retrograde  pyelograms.  The  right ureteral orifice was cannulated with a 6-French renal catheter and right retrograde pyelogram was obtained.  Right retrograde pyelogram demonstrated a  single right ureter and single system right kidney.  No obvious filling defects or narrowing noted.  A 0.03 ZIPwire was advanced to lower pole and set aside as a safety wire.  Next, left retrograde pyelogram was  obtained.  Left retrograde pyelogram demonstrated a single left ureter and single system left kidney.  No filling defects or narrowing noted.  A separate 0.03 ZIPwire was advanced to lower pole and set aside as a safety wire.  An 8-French feeding tube was placed in  the urinary bladder for pressure release and semirigid ureteroscopy performed of the distal four-fifths of the left ureter alongside a separate sensor working wire.  No mucosal abnormalities were found.  Next, semirigid ureteroscopy was performed of the  distal four-fifths of the right ureter alongside a separate sensor working wire.  No mucosal abnormalities were found.  The semirigid scope was exchanged for a 12/14 medium length ureteral access sheath to the level of the proximal ureter using  continuous fluoroscopic guidance over the sensor working wire.  Then flexible digital ureteroscopy was performed of the proximal right ureter and systematic inspection of the right kidney, including all calices x3.  There was a dominant area  calcification in the upper midpole calyx.  This did appear amenable to simple basketing.  Using the Escape basket, these fragments were removed and set aside.  Access sheath was then removed under continuous vision.  No significant mucosal abnormalities  were found.  Next, the access sheath was placed over the left sensor working wire.  However, due to angulation and non-prestenting, the sheath could only be safely placed in the distal third of the ureter and flexible digital ureteroscopy was performed  of above this.  There were  are multifocal intrarenal calcifications on the left side upper pole and upper mid; total stone volume approximately 1.3 cm.  Due to the non-prestenting nature and the suboptimal access sheath angulation, laser lithotripsy was  not possible.  As such, it was clearly felt as expected that a staged approach would be warranted.  Decision was then made to stent both sides today and proceed as planned in several weeks for a second-stage procedure, focusing on his residual stone  volume, which is mostly left renal.  As such, the access sheath was removed under direct continuous vision.  No significant mucosal abnormalities were found.  New 5 x 26 Polaris-type stents were placed over remaining safety wires bilaterally using  fluoroscopic guidance.  Good proximal and distal plane were noted.  The procedure was terminated.  The patient tolerated the procedure well and there were no immediate perioperative complications.  The patient was taken to the postanesthesia care unit in  stable condition.  Plan for discharge home.  VN/NUANCE  D:12/23/2019 T:12/23/2019 JOB:[id number]

## 2019-12-23 NOTE — Anesthesia Postprocedure Evaluation (Signed)
Anesthesia Post Note  Patient: Dennis Spencer  Procedure(s) Performed: CYSTOSCOPY WITH RETROGRADE PYELOGRAM, URETEROSCOPY AND STENT PLACEMENT (Bilateral ) HOLMIUM LASER APPLICATION (Bilateral ) CYSTOSCOPY WITH LITHOLAPAXY (N/A )     Patient location during evaluation: PACU Anesthesia Type: General Level of consciousness: awake and alert and oriented Pain management: pain level controlled Vital Signs Assessment: post-procedure vital signs reviewed and stable Respiratory status: spontaneous breathing, nonlabored ventilation and respiratory function stable Cardiovascular status: blood pressure returned to baseline and stable Postop Assessment: no apparent nausea or vomiting Anesthetic complications: no   No complications documented.  Last Vitals:  Vitals:   12/23/19 1430 12/23/19 1446  BP: (!) 163/72 (!) 135/56  Pulse: 75 68  Resp: 15 12  Temp:  36.6 C  SpO2: 100% 99%    Last Pain:  Vitals:   12/23/19 1446  TempSrc: Oral  PainSc: 0-No pain                 Donyae Kilner A.

## 2019-12-23 NOTE — Anesthesia Preprocedure Evaluation (Addendum)
Anesthesia Evaluation  Patient identified by MRN, date of birth, ID band Patient awake    Airway Mallampati: Trach       Dental   Pulmonary shortness of breath, with exertion and at rest, sleep apnea and Continuous Positive Airway Pressure Ventilation , former smoker,  Tracheostomy since 1981   Pulmonary exam normal breath sounds clear to auscultation       Cardiovascular hypertension, Pt. 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   Anesthesia Other Findings   Reproductive/Obstetrics                            Anesthesia Physical Anesthesia Plan  ASA: III  Anesthesia Plan: General   Post-op Pain Management:    Induction: Intravenous  PONV Risk Score and Plan: 4 or greater and Treatment may vary due to age or medical condition and Ondansetron  Airway Management Planned: Tracheostomy  Additional Equipment:   Intra-op Plan:   Post-operative Plan: Extubation in OR  Informed Consent: I have reviewed the patients History and Physical, chart, labs and discussed the procedure including the risks, benefits and alternatives for the proposed anesthesia with the patient or authorized representative who has indicated his/her understanding and acceptance.     Dental advisory given  Plan Discussed with: CRNA, Anesthesiologist and Surgeon  Anesthesia Plan Comments: (6.0 reinforced ETT to be placed after induction of GA and removal of tracheostomy tube. Will replace tracheostomy tube after  procedure.)        Anesthesia Quick Evaluation

## 2019-12-23 NOTE — Discharge Instructions (Signed)
1 - You may have urinary urgency (bladder spasms) and bloody urine on / off with stent in place. This is normal.  2 - Call MD or go to ER for fever >102, severe pain / nausea / vomiting not relieved by medications, or acute change in medical status    General Anesthesia, Adult, Care After This sheet gives you information about how to care for yourself after your procedure. Your health care provider may also give you more specific instructions. If you have problems or questions, contact your health care provider. What can I expect after the procedure? After the procedure, the following side effects are common:  Pain or discomfort at the IV site.  Nausea.  Vomiting.  Sore throat.  Trouble concentrating.  Feeling cold or chills.  Weak or tired.  Sleepiness and fatigue.  Soreness and body aches. These side effects can affect parts of the body that were not involved in surgery. Follow these instructions at home:  For at least 24 hours after the procedure:  Have a responsible adult stay with you. It is important to have someone help care for you until you are awake and alert.  Rest as needed.  Do not: ? Participate in activities in which you could fall or become injured. ? Drive. ? Use heavy machinery. ? Drink alcohol. ? Take sleeping pills or medicines that cause drowsiness. ? Make important decisions or sign legal documents. ? Take care of children on your own. Eating and drinking  Follow any instructions from your health care provider about eating or drinking restrictions.  When you feel hungry, start by eating small amounts of foods that are soft and easy to digest (bland), such as toast. Gradually return to your regular diet.  Drink enough fluid to keep your urine pale yellow.  If you vomit, rehydrate by drinking water, juice, or clear broth. General instructions  If you have sleep apnea, surgery and certain medicines can increase your risk for breathing  problems. Follow instructions from your health care provider about wearing your sleep device: ? Anytime you are sleeping, including during daytime naps. ? While taking prescription pain medicines, sleeping medicines, or medicines that make you drowsy.  Return to your normal activities as told by your health care provider. Ask your health care provider what activities are safe for you.  Take over-the-counter and prescription medicines only as told by your health care provider.  If you smoke, do not smoke without supervision.  Keep all follow-up visits as told by your health care provider. This is important. Contact a health care provider if:  You have nausea or vomiting that does not get better with medicine.  You cannot eat or drink without vomiting.  You have pain that does not get better with medicine.  You are unable to pass urine.  You develop a skin rash.  You have a fever.  You have redness around your IV site that gets worse. Get help right away if:  You have difficulty breathing.  You have chest pain.  You have blood in your urine or stool, or you vomit blood. Summary  After the procedure, it is common to have a sore throat or nausea. It is also common to feel tired.  Have a responsible adult stay with you for the first 24 hours after general anesthesia. It is important to have someone help care for you until you are awake and alert.  When you feel hungry, start by eating small amounts of foods that are  soft and easy to digest (bland), such as toast. Gradually return to your regular diet.  Drink enough fluid to keep your urine pale yellow.  Return to your normal activities as told by your health care provider. Ask your health care provider what activities are safe for you. This information is not intended to replace advice given to you by your health care provider. Make sure you discuss any questions you have with your health care provider. Document Revised:  05/01/2017 Document Reviewed: 12/12/2016 Elsevier Patient Education  [employer].

## 2019-12-23 NOTE — H&P (Signed)
Dennis Spencer is an 74 y.o. male.    Chief Complaint: Pre-Op BILATERAL 1st stage ureteroscopic stone manipulation  HPI:   1 - Recurrent Surgical Nephrolithiasis -   2011 - PCNL for large tones  2015 - Rt URS / Lt PCNL for huge volume left renal and bilateral ureteral stones, Total stone volume >6cm2  2019 - Bilateral 3 stage URS for Lt>Rt stone 4cm2 total  2020 - Bilateral 2 stage URS for Lt>Rt stone, 3cm2 total    Recent Surveillance:  04/2019 - KUB- stone free  10/2019 - KUB/RUS - recurrent bilateral renal stones, abtou 1.4cm total each side and approx 3cm bladder stone.   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 Currently on CaCO4 with meals.   Eval 2011 - 24 hr urine - severe hyperoxaluria >170   Eval 2015 - BMP,PTH,Urate - normal, Composition - CaOx; 24 hr Urines - high oxalate (60), low citrate (160) --> CaCitrate 2080 MEQ TID meals    3 - Renal Insufficiency - Cr 1.75 after episode of severe bilateral obstruction 2015 with peak of 6. Now established with nephrology. Most recnt Cr <1.5.    PMH sig for morbid obesity, trach (since 1980s). Denies ischemic heart disease. His PCP is Dennis Fraise MD with [facility].    Today Dennis Spencer is seen to proceed with BILATERAL 1st stage ureteroscopy and cystolithalopext for large recurrent renal stones and bladder stone. No interval fevers. C19 screen negative. Most recent UCX negative.    Past Medical History:  Diagnosis Date  . Anticoagulant long-term use    brilinta  . Arthritis    Knees  . Carotid stenosis, bilateral    per duplex 04-23-2018  bilateral ICA <50%  . Chronic cough    per pt this normal due to tracheostomy  . CKD (chronic kidney disease), stage III 11/10/2013  . Dyspnea   . GERD (gastroesophageal reflux disease)   . History of CVA (cerebrovascular accident) 04/13/2018   chronic lacunar infarct--- per pt no residuals  . History of  kidney stones    multiple kidney stones  . History of osteomyelitis age 75   LLE  . Hyperlipidemia   . Hypertension   . OSA (obstructive sleep apnea) study in epic 09-25-2005  severe osa   1981 s/p tracheostomy, tongue reduction and septoplasty;  LEAVES TRACH OPEN AT NIGHT FOR SLEEP APNEA  . Presence of tracheostomy ([place]) since 1981   uncaps at night due to sleep apnea - caps during the day  . Renal calculi    bilateral  . Severe obesity (BMI >= 40) (HCC)   . Stroke [facility]) 2019   Possible TIA in the past  . Urgency of urination   . Wears glasses     Past Surgical History:  Procedure Laterality Date  . CHOLECYSTECTOMY N/A 06/15/2015   Procedure: LAPAROSCOPIC CHOLECYSTECTOMY;  Surgeon: Ralene Ok, MD;  Location: [facility];  Service: General;  Laterality: N/A;  . COLONOSCOPY  last one 03-28-2016  . CYSTOSCOPY W/ URETERAL STENT REMOVAL Right 06/26/2017   Procedure: CYSTOSCOPY WITH STENT REMOVAL;  Surgeon: Alexis Frock, MD;  Location: [facility];  Service: Urology;  Laterality: Right;  . CYSTOSCOPY W/ URETERAL STENT REMOVAL Bilateral 10/22/2018   Procedure: CYSTOSCOPY WITH STENT REMOVAL;  Surgeon: Alexis Frock, MD;  Location: [facility];  Service: Urology;  Laterality: Bilateral;  . CYSTOSCOPY WITH RETROGRADE PYELOGRAM, URETEROSCOPY AND STENT PLACEMENT Bilateral 06/18/2013   Procedure: CYSTOSCOPY WITH  RETROGRADE PYELOGRAM, Left URETEROSCOPY with laser , AND bilateral STENT PLACEMENT;  Surgeon: Alexis Frock, MD;  Location: [facility];  Service: Urology;  Laterality: Bilateral;  . CYSTOSCOPY WITH RETROGRADE PYELOGRAM, URETEROSCOPY AND STENT PLACEMENT Right 08/08/2013   Procedure: CYSTOSCOPY WITH RIGHT RETROGRADE PYELOGRAM, URETEROSCOPY AND STENT PLACEMENT, stone extraction;  Surgeon: Alexis Frock, MD;  Location: [facility];  Service: Urology;  Laterality: Right;  . CYSTOSCOPY WITH RETROGRADE PYELOGRAM, URETEROSCOPY AND STENT PLACEMENT Right 08/10/2013   Procedure: 2ND STAGE CYSTOSCOPY WITH RETROGRADE  PYELOGRAM, URETEROSCOPY AND STENT EXCHANGE;  Surgeon: Alexis Frock, MD;  Location: [facility];  Service: Urology;  Laterality: Right;  . CYSTOSCOPY WITH RETROGRADE PYELOGRAM, URETEROSCOPY AND STENT PLACEMENT Bilateral 05/22/2017   Procedure: CYSTOSCOPY WITH RETROGRADE PYELOGRAM, URETEROSCOPY AND STENT PLACEMENT;  Surgeon: Alexis Frock, MD;  [facility];  Service: Urology;  Laterality: Bilateral;  . CYSTOSCOPY WITH RETROGRADE PYELOGRAM, URETEROSCOPY AND STENT PLACEMENT Bilateral 06/05/2017   Procedure: CYSTOSCOPY WITH RETROGRADE PYELOGRAM, URETEROSCOPY AND STENT PLACEMENT;  Surgeon: Alexis Frock, MD;  [facility]: [facility];  Service: Urology;  Laterality: Bilateral;  . CYSTOSCOPY WITH RETROGRADE PYELOGRAM, URETEROSCOPY AND STENT PLACEMENT Bilateral 08/13/2018   Procedure: CYSTOSCOPY WITH RETROGRADE PYELOGRAM, URETEROSCOPY AND STENT PLACEMENT;  Surgeon: Alexis Frock, MD;  Location: [facility];  Service: Urology;  Laterality: Bilateral;  90 MINS  . CYSTOSCOPY WITH RETROGRADE PYELOGRAM, URETEROSCOPY AND STENT PLACEMENT Bilateral 10/22/2018   Procedure: CYSTOSCOPY WITH RETROGRADE PYELOGRAM, URETEROSCOPY  STONE BASKETRY AND STENT PLACEMENT;  Surgeon: Alexis Frock, MD;  Location: [facility];  Service: Urology;  Laterality: Bilateral;  . CYSTOSCOPY/URETEROSCOPY/HOLMIUM LASER/STENT PLACEMENT Left 06/26/2017   Procedure: CYSTOSCOPY/URETEROSCOPY third stage/HOLMIUM LASER/STENT PLACEMENT retrograde pylegram;  Surgeon: Alexis Frock, MD;  Location: [facility];  Service: Urology;  Laterality: Left;  Marland Kitchen GASTRIC ROUX-EN-Y  2004    @[facility]   . HOLMIUM LASER APPLICATION Bilateral 08/16/8293   Procedure: HOLMIUM LASER APPLICATION;  Surgeon: Alexis Frock, MD;  Location: [facility];  Service: Urology;  Laterality: Bilateral;  . HOLMIUM LASER APPLICATION Left 10/12/1306   Procedure: HOLMIUM LASER APPLICATION;  Surgeon: Alexis Frock, MD;  [facility];  Service: Urology;  Laterality: Left;  . HOLMIUM LASER APPLICATION Bilateral  6/57/8469   Procedure: HOLMIUM LASER APPLICATION;  Surgeon: Alexis Frock, MD;  [facility];  Service: Urology;  Laterality: Bilateral;  . HOLMIUM LASER APPLICATION Bilateral 10/12/9526   Procedure: HOLMIUM LASER APPLICATION;  Surgeon: Alexis Frock, MD;  [facility]: [facility];  Service: Urology;  Laterality: Bilateral;  . HOLMIUM LASER APPLICATION Bilateral 08/23/2438   Procedure: HOLMIUM LASER APPLICATION;  Surgeon: Alexis Frock, MD;  Location: [facility];  Service: Urology;  Laterality: Bilateral;  . NEPHROLITHOTOMY Left 08/08/2013   Procedure: 1ST STAGE NEPHROLITHOTOMY PERCUTANEOUS WITH SURGEON ACCESS;  Surgeon: Alexis Frock, MD;  Location: [facility];  Service: Urology;  Laterality: Left;  . NEPHROLITHOTOMY Left 08/10/2013   Procedure: NEPHROLITHOTOMY PERCUTANEOUS SECOND LOOK/LEFT DIGITAL URETEROSCOPY/BASKETING OF STONE/EXCHANGE OF LEFT URETERAL STENT;  Surgeon: Alexis Frock, MD;  Location: [facility];  Service: Urology;  Laterality: Left;  . ORCHIOPEXY  child   unlateral undescended testis  . PERCUTANEOUS NEPHROSTOLITHOTOMY Left 09-19-2009   dr [email or web address]  @[facility]   . TONSILLECTOMY  child  . TRACHEOSTOMY  1987   s/p tongue reduction and nasal septum deviation repair for  Sleep apnea   . URETEROSCOPY WITH HOLMIUM LASER LITHOTRIPSY Left 04-14-2002   dr Diona Fanti  @[facility]   . WISDOM TOOTH EXTRACTION      Family History  Problem Relation Age of Onset  . Cancer Mother  Ovarian, died age 57.  . Diabetes Father   . Hypertension Father   . Heart failure Father        Died of MI age 25.  . Breast cancer Maternal Grandmother   . Colon cancer Maternal Grandfather   . Esophageal cancer Neg Hx   . Stomach cancer Neg Hx   . Rectal cancer Neg Hx    Social History:  reports that he quit smoking about 51 years ago. His smoking use included cigarettes. He has a 8.00 pack-year smoking history. He has never used smokeless tobacco. He reports previous alcohol use. He reports that he does not use  drugs.  Allergies:  Allergies  Allergen Reactions  . Lisinopril Other (See Comments)    DIZZINESS    No medications prior to admission.    No results found for this or any previous visit (from the past 48 hour(s)). No results found.  Review of Systems  Constitutional: Negative.   HENT: Negative.   Eyes: Negative.   Respiratory: Negative.   Cardiovascular: Negative.   Endocrine: Negative.   Genitourinary: Positive for dysuria and hematuria.  Musculoskeletal: Negative.   Allergic/Immunologic: Negative.   Neurological: Negative.   Hematological: Negative.   Psychiatric/Behavioral: Negative.   All other systems reviewed and are negative.   There were no vitals taken for this visit. Physical Exam Vitals reviewed.  Constitutional:      Comments: Very pleasant. Stable morbid truncal obesity. Trach with PMV.   HENT:     Head: Normocephalic.     Nose: Nose normal.  Eyes:     Pupils: Pupils are equal, round, and reactive to light.  Cardiovascular:     Rate and Rhythm: Normal rate.  Pulmonary:     Effort: Pulmonary effort is normal.  Abdominal:     Comments: Stable large obesity limits sensitivity of exam.   Genitourinary:    Penis: Normal.   Musculoskeletal:     Cervical back: Normal range of motion.  Skin:    General: Skin is warm.  Neurological:     General: No focal deficit present.     Mental Status: He is alert.  Psychiatric:        Mood and Affect: Mood normal.      Assessment/Plan  Proceed as planned with BILATERAL ureteroscopic stone manipulation and cystolithalopexy. Risks, benefits, alternatives, expected peri-op course including need for probable staged approach at his stone volume discussed previously and reiterated today.   Alexis Frock, MD 12/23/2019, 6:12 AM

## 2019-12-23 NOTE — Transfer of Care (Signed)
Immediate Anesthesia Transfer of Care Note  Patient: Dennis Spencer  Procedure(s) Performed: CYSTOSCOPY WITH RETROGRADE PYELOGRAM, URETEROSCOPY AND STENT PLACEMENT (Bilateral ) HOLMIUM LASER APPLICATION (Bilateral ) CYSTOSCOPY WITH LITHOLAPAXY (N/A )  Patient Location: PACU  Anesthesia Type:General  Level of Consciousness: awake and alert   Airway & Oxygen Therapy: Patient Spontanous Breathing and Patient connected to face mask oxygen  Post-op Assessment: Report given to RN and Post -op Vital signs reviewed and stable  Post vital signs: Reviewed and stable  Last Vitals:  Vitals Value Taken Time  BP 158/69 12/23/19 1400  Temp    Pulse 70 12/23/19 1402  Resp 14 12/23/19 1402  SpO2 100 % 12/23/19 1402  Vitals shown include unvalidated device data.  Last Pain:  Vitals:   12/23/19 1010  TempSrc:   PainSc: 0-No pain         Complications: No complications documented.

## 2019-12-23 NOTE — Brief Op Note (Signed)
12/23/2019  1:46 PM  PATIENT:  Dennis Spencer  74 y.o. male  PRE-OPERATIVE DIAGNOSIS:  BILATERAL RENAL STONES, BLADDER STONES  POST-OPERATIVE DIAGNOSIS:  BILATERAL RENAL STONES, BLADDER STONES  PROCEDURE:  Procedure(s) with comments: CYSTOSCOPY WITH RETROGRADE PYELOGRAM, URETEROSCOPY AND STENT PLACEMENT (Bilateral) - 90 MINS HOLMIUM LASER APPLICATION (Bilateral) CYSTOSCOPY WITH LITHOLAPAXY (N/A)  SURGEON:  Surgeon(s) and Role:    Alexis Frock, MD - Primary  PHYSICIAN ASSISTANT:   ASSISTANTS: none   ANESTHESIA:   general  EBL:  minimal   BLOOD ADMINISTERED:none  DRAINS: none   LOCAL MEDICATIONS USED:  NONE  SPECIMEN:  Source of Specimen:  bladder and renal stone fragments  DISPOSITION OF SPECIMEN:  discard  COUNTS:  YES  TOURNIQUET:  * No tourniquets in log *  DICTATION: .Other Dictation: Dictation Number  [id number]  PLAN OF CARE: Discharge to home after PACU  PATIENT DISPOSITION:  PACU - hemodynamically stable.   Delay start of Pharmacological VTE agent (>24hrs) due to surgical blood loss or risk of bleeding: yes

## 2019-12-24 ENCOUNTER — Encounter (HOSPITAL_COMMUNITY): Payer: Self-pay | Admitting: Urology

## 2019-12-24 ENCOUNTER — Other Ambulatory Visit: Payer: Self-pay | Admitting: Family Medicine

## 2019-12-24 DIAGNOSIS — I10 Essential (primary) hypertension: Secondary | ICD-10-CM

## 2019-12-26 NOTE — Telephone Encounter (Signed)
Patient aware and appt made 

## 2019-12-26 NOTE — Telephone Encounter (Signed)
Stacks. NTBS 30 days given 12/01/19

## 2020-01-03 NOTE — Patient Instructions (Addendum)
DUE TO COVID-19 ONLY ONE VISITOR IS ALLOWED TO COME WITH YOU AND STAY IN THE WAITING ROOM ONLY DURING PRE OP AND PROCEDURE DAY OF SURGERY. THE 1 VISITOR  MAY VISIT WITH YOU AFTER SURGERY IN YOUR PRIVATE ROOM DURING VISITING HOURS ONLY!  YOU NEED TO HAVE A COVID 19 TEST ON: 01/04/20, THIS TEST MUST BE DONE BEFORE SURGERY,  COVID TESTING SITE [address] [place] [id number], IT IS ON THE RIGHT GOING OUT [address] APPROXIMATELY  2 MINUTES PAST [employer] ON THE RIGHT. ONCE YOUR COVID TEST IS COMPLETED,  PLEASE BEGIN THE QUARANTINE INSTRUCTIONS AS OUTLINED IN YOUR HANDOUT.                [name]    Your procedure is scheduled on: 01/06/20   Report to [facility] Main  Entrance   Report to admitting at: 1:15 PM     Call this number if you have problems the morning of surgery [phone]    Remember: Do not eat solid food :After Midnight. Clear liquids from midnight until: 12:15 pm  CLEAR LIQUID DIET   Foods Allowed                                                                     Foods Excluded  Coffee and tea, regular and decaf                             liquids that you cannot  Plain Jell-O any favor except red or purple                                           see through such as: Fruit ices (not with fruit pulp)                                     milk, soups, orange juice  Iced Popsicles                                    All solid food Carbonated beverages, regular and diet                                    Cranberry, grape and apple juices Sports drinks like Gatorade Lightly seasoned clear broth or consume(fat free) Sugar, honey syrup  Sample Menu Breakfast                                Lunch                                     Supper Cranberry juice  Beef broth                            Chicken broth Jell-O                                     Grape juice                           Apple juice Coffee or tea                         Jell-O                                      Popsicle                                                Coffee or tea                        Coffee or tea  _____________________________________________________________________  BRUSH YOUR TEETH MORNING OF SURGERY AND RINSE YOUR MOUTH OUT, NO CHEWING GUM CANDY OR MINTS.     Take these medicines the morning of surgery with A SIP OF WATER: amlodipine,pantoprazole.                               You may not have any metal on your body including hair pins and              piercings  Do not wear jewelry, lotions, powders or perfumes, deodorant             Men may shave face and neck.   Do not bring valuables to the hospital. Dennis Spencer.  Contacts, dentures or bridgework may not be worn into surgery.  Leave suitcase in the car. After surgery it may be brought to your room.     Patients discharged the day of surgery will not be allowed to drive home. IF YOU ARE HAVING SURGERY AND GOING HOME THE SAME DAY, YOU MUST HAVE AN ADULT TO DRIVE YOU HOME AND BE WITH YOU FOR 24 HOURS. YOU MAY GO HOME BY TAXI OR UBER OR ORTHERWISE, BUT AN ADULT MUST ACCOMPANY YOU HOME AND STAY WITH YOU FOR 24 HOURS.  Name and phone number of your driver:  Special Instructions: N/A              Please read over the following fact sheets you were given: _____________________________________________________________________        [facility] - Preparing for Surgery Before surgery, you can play an important role.  Because skin is not sterile, your skin needs to be as free of germs as possible.  You can reduce the number of germs on your skin by washing with CHG (chlorahexidine gluconate) soap before surgery.  CHG is an antiseptic cleaner which kills germs and bonds with the skin to continue killing germs even after  washing. Please DO NOT use if you have an allergy to CHG or antibacterial soaps.  If your skin becomes  reddened/irritated stop using the CHG and inform your nurse when you arrive at Short Stay. Do not shave (including legs and underarms) for at least 48 hours prior to the first CHG shower.  You may shave your face/neck. Please follow these instructions carefully:  1.  Shower with CHG Soap the night before surgery and the  morning of Surgery.  2.  If you choose to wash your hair, wash your hair first as usual with your  normal  shampoo.  3.  After you shampoo, rinse your hair and body thoroughly to remove the  shampoo.                           4.  Use CHG as you would any other liquid soap.  You can apply chg directly  to the skin and wash                       Gently with a scrungie or clean washcloth.  5.  Apply the CHG Soap to your body ONLY FROM THE NECK DOWN.   Do not use on face/ open                           Wound or open sores. Avoid contact with eyes, ears mouth and genitals (private parts).                       Wash face,  Genitals (private parts) with your normal soap.             6.  Wash thoroughly, paying special attention to the area where your surgery  will be performed.  7.  Thoroughly rinse your body with warm water from the neck down.  8.  DO NOT shower/wash with your normal soap after using and rinsing off  the CHG Soap.                9.  Pat yourself dry with a clean towel.            10.  Wear clean pajamas.            11.  Place clean sheets on your bed the night of your first shower and do not  sleep with pets. Day of Surgery : Do not apply any lotions/deodorants the morning of surgery.  Please wear clean clothes to the hospital/surgery center.  FAILURE TO FOLLOW THESE INSTRUCTIONS MAY RESULT IN THE CANCELLATION OF YOUR SURGERY PATIENT SIGNATURE_________________________________  NURSE SIGNATURE__________________________________  ________________________________________________________________________

## 2020-01-04 ENCOUNTER — Other Ambulatory Visit (HOSPITAL_COMMUNITY)
Admission: RE | Admit: 2020-01-04 | Discharge: 2020-01-04 | Disposition: A | Discharge status: Home / Self Care | Payer: Medicare HMO | Source: Ambulatory Visit | Attending: Urology | Admitting: Urology

## 2020-01-04 ENCOUNTER — Other Ambulatory Visit: Payer: Self-pay

## 2020-01-04 ENCOUNTER — Encounter (HOSPITAL_COMMUNITY): Payer: Self-pay

## 2020-01-04 ENCOUNTER — Ambulatory Visit: Payer: Medicare HMO | Admitting: Family Medicine

## 2020-01-04 ENCOUNTER — Encounter (HOSPITAL_COMMUNITY)
Admission: RE | Admit: 2020-01-04 | Discharge: 2020-01-04 | Disposition: A | Discharge status: Home / Self Care | Payer: Medicare HMO | Source: Ambulatory Visit | Attending: Urology | Admitting: Urology

## 2020-01-04 DIAGNOSIS — Z20822 Contact with and (suspected) exposure to covid-19: Secondary | ICD-10-CM | POA: Diagnosis not present

## 2020-01-04 DIAGNOSIS — Z01812 Encounter for preprocedural laboratory examination: Secondary | ICD-10-CM | POA: Diagnosis not present

## 2020-01-04 LAB — CBC
HCT: 38.9 % — ABNORMAL LOW (ref 39.0–52.0)
Hemoglobin: 12.2 g/dL — ABNORMAL LOW (ref 13.0–17.0)
MCH: 28.1 pg (ref 26.0–34.0)
MCHC: 31.4 g/dL (ref 30.0–36.0)
MCV: 89.6 fL (ref 80.0–100.0)
Platelets: 272 10*3/uL (ref 150–400)
RBC: 4.34 MIL/uL (ref 4.22–5.81)
RDW: 15.7 % — ABNORMAL HIGH (ref 11.5–15.5)
WBC: 7.3 10*3/uL (ref 4.0–10.5)
nRBC: 0 % (ref 0.0–0.2)

## 2020-01-04 LAB — BASIC METABOLIC PANEL
Anion gap: 11 (ref 5–15)
BUN: 23 mg/dL (ref 8–23)
CO2: 24 mmol/L (ref 22–32)
Calcium: 9.2 mg/dL (ref 8.9–10.3)
Chloride: 109 mmol/L (ref 98–111)
Creatinine, Ser: 1.67 mg/dL — ABNORMAL HIGH (ref 0.61–1.24)
GFR calc Af Amer: 46 mL/min — ABNORMAL LOW (ref >60)
GFR calc non Af Amer: 40 mL/min — ABNORMAL LOW (ref >60)
Glucose, Bld: 102 mg/dL — ABNORMAL HIGH (ref 70–99)
Potassium: 5.2 mmol/L — ABNORMAL HIGH (ref 3.5–5.1)
Sodium: 144 mmol/L (ref 135–145)

## 2020-01-04 LAB — SARS CORONAVIRUS 2 (TAT 6-24 HRS): SARS Coronavirus 2: NEGATIVE

## 2020-01-04 NOTE — Progress Notes (Signed)
Lab results: Creatine: 1.67

## 2020-01-04 NOTE — Progress Notes (Signed)
COVID Vaccine Completed:  Date COVID Vaccine completed: COVID vaccine manufacturer: Pf[employer]    [employer]   PCP - Dr. Claretta Fraise. LOV: 05/02/19 Cardiologist -   Chest x-ray -  EKG -  12/16/19. EPIC Stress Test -  ECHO -  Cardiac Cath -   Sleep Study -  CPAP -   Fasting Blood Sugar -  Checks Blood Sugar _____ times a day  Blood Thinner Instructions: Continue Brilinta as per Dr. Zettie Pho orders. Aspirin Instructions: Last Dose:  Anesthesia review: Hx: HTN,CVA,CKD(III),Tracheostomy.  Patient denies shortness of breath, fever, cough and chest pain at PAT appointment   Patient verbalized understanding of instructions that were given to them at the PAT appointment. Patient was also instructed that they will need to review over the PAT instructions again at home before surgery.

## 2020-01-05 MED ORDER — GENTAMICIN SULFATE 40 MG/ML IJ SOLN
5.0000 mg/kg | INTRAVENOUS | Status: AC
  Administered 2020-01-06: 520 mg via INTRAVENOUS
  Filled 2020-01-05: qty 13

## 2020-01-06 ENCOUNTER — Encounter (HOSPITAL_COMMUNITY)
Admission: RE | Disposition: A | Discharge status: Home / Self Care | Payer: Self-pay | Source: Home / Self Care | Attending: Urology

## 2020-01-06 ENCOUNTER — Ambulatory Visit (HOSPITAL_COMMUNITY): Payer: Medicare HMO | Admitting: Anesthesiology

## 2020-01-06 ENCOUNTER — Ambulatory Visit (HOSPITAL_COMMUNITY)
Admission: RE | Admit: 2020-01-06 | Discharge: 2020-01-06 | Disposition: A | Discharge status: Home / Self Care | Payer: Medicare HMO | Attending: Urology | Admitting: Urology

## 2020-01-06 ENCOUNTER — Encounter (HOSPITAL_COMMUNITY): Payer: Self-pay | Admitting: Urology

## 2020-01-06 ENCOUNTER — Ambulatory Visit (HOSPITAL_COMMUNITY): Payer: Medicare HMO

## 2020-01-06 DIAGNOSIS — N200 Calculus of kidney: Secondary | ICD-10-CM | POA: Diagnosis not present

## 2020-01-06 DIAGNOSIS — N183 Chronic kidney disease, stage 3 unspecified: Secondary | ICD-10-CM | POA: Insufficient documentation

## 2020-01-06 DIAGNOSIS — Z9884 Bariatric surgery status: Secondary | ICD-10-CM | POA: Diagnosis not present

## 2020-01-06 DIAGNOSIS — Z930 Tracheostomy status: Secondary | ICD-10-CM | POA: Diagnosis not present

## 2020-01-06 DIAGNOSIS — Z8673 Personal history of transient ischemic attack (TIA), and cerebral infarction without residual deficits: Secondary | ICD-10-CM | POA: Diagnosis not present

## 2020-01-06 DIAGNOSIS — I129 Hypertensive chronic kidney disease with stage 1 through stage 4 chronic kidney disease, or unspecified chronic kidney disease: Secondary | ICD-10-CM | POA: Insufficient documentation

## 2020-01-06 DIAGNOSIS — Z9049 Acquired absence of other specified parts of digestive tract: Secondary | ICD-10-CM | POA: Diagnosis not present

## 2020-01-06 DIAGNOSIS — M170 Bilateral primary osteoarthritis of knee: Secondary | ICD-10-CM | POA: Insufficient documentation

## 2020-01-06 DIAGNOSIS — Z888 Allergy status to other drugs, medicaments and biological substances status: Secondary | ICD-10-CM | POA: Diagnosis not present

## 2020-01-06 DIAGNOSIS — Z8249 Family history of ischemic heart disease and other diseases of the circulatory system: Secondary | ICD-10-CM | POA: Diagnosis not present

## 2020-01-06 DIAGNOSIS — Z87891 Personal history of nicotine dependence: Secondary | ICD-10-CM | POA: Insufficient documentation

## 2020-01-06 DIAGNOSIS — Z87442 Personal history of urinary calculi: Secondary | ICD-10-CM | POA: Insufficient documentation

## 2020-01-06 DIAGNOSIS — N132 Hydronephrosis with renal and ureteral calculous obstruction: Secondary | ICD-10-CM | POA: Diagnosis not present

## 2020-01-06 DIAGNOSIS — G4733 Obstructive sleep apnea (adult) (pediatric): Secondary | ICD-10-CM | POA: Insufficient documentation

## 2020-01-06 DIAGNOSIS — Z7901 Long term (current) use of anticoagulants: Secondary | ICD-10-CM | POA: Insufficient documentation

## 2020-01-06 HISTORY — PX: CYSTOSCOPY WITH RETROGRADE PYELOGRAM, URETEROSCOPY AND STENT PLACEMENT: SHX5789

## 2020-01-06 SURGERY — CYSTOURETEROSCOPY, WITH RETROGRADE PYELOGRAM AND STENT INSERTION
Anesthesia: General | Laterality: Bilateral

## 2020-01-06 MED ORDER — MIDAZOLAM HCL 5 MG/5ML IJ SOLN
INTRAMUSCULAR | Status: DC | PRN
  Administered 2020-01-06: .5 mg via INTRAVENOUS

## 2020-01-06 MED ORDER — FENTANYL CITRATE (PF) 100 MCG/2ML IJ SOLN
INTRAMUSCULAR | Status: DC | PRN
Start: 2020-01-06 — End: 2020-01-06
  Administered 2020-01-06 (×2): 25 ug via INTRAVENOUS

## 2020-01-06 MED ORDER — SODIUM CHLORIDE 0.9 % IV SOLN: INTRAVENOUS | Status: DC | PRN

## 2020-01-06 MED ORDER — FENTANYL CITRATE (PF) 100 MCG/2ML IJ SOLN: 25.0000 ug | INTRAMUSCULAR | Status: DC | PRN

## 2020-01-06 MED ORDER — LIDOCAINE 2% (20 MG/ML) 5 ML SYRINGE
INTRAMUSCULAR | Status: DC | PRN
  Administered 2020-01-06: 80 mg via INTRAVENOUS

## 2020-01-06 MED ORDER — ACETAMINOPHEN 10 MG/ML IV SOLN: 1000.0000 mg | Freq: Once | INTRAVENOUS | Status: DC | PRN

## 2020-01-06 MED ORDER — CEPHALEXIN 500 MG PO CAPS: 500.0000 mg | ORAL_CAPSULE | Freq: Two times a day (BID) | ORAL | 0 refills | Status: DC

## 2020-01-06 MED ORDER — PROPOFOL 10 MG/ML IV BOLUS
INTRAVENOUS | Status: DC | PRN
  Administered 2020-01-06 (×2): 100 mg via INTRAVENOUS

## 2020-01-06 MED ORDER — ORAL CARE MOUTH RINSE
15.0000 mL | Freq: Once | OROMUCOSAL | Status: AC
  Administered 2020-01-06: 15 mL via OROMUCOSAL

## 2020-01-06 MED ORDER — PROPOFOL 10 MG/ML IV BOLUS
INTRAVENOUS | Status: AC
  Filled 2020-01-06: qty 20

## 2020-01-06 MED ORDER — FENTANYL CITRATE (PF) 100 MCG/2ML IJ SOLN
INTRAMUSCULAR | Status: AC
  Filled 2020-01-06: qty 2

## 2020-01-06 MED ORDER — KETOROLAC TROMETHAMINE 10 MG PO TABS
10.0000 mg | ORAL_TABLET | Freq: Three times a day (TID) | ORAL | 0 refills | Status: DC | PRN
Start: 2020-01-06 — End: 2020-01-31

## 2020-01-06 MED ORDER — OXYCODONE-ACETAMINOPHEN 5-325 MG PO TABS: 1.0000 | ORAL_TABLET | Freq: Four times a day (QID) | ORAL | 0 refills | Status: DC | PRN

## 2020-01-06 MED ORDER — ONDANSETRON HCL 4 MG/2ML IJ SOLN: 4.0000 mg | Freq: Once | INTRAMUSCULAR | Status: DC | PRN

## 2020-01-06 MED ORDER — IOHEXOL 300 MG/ML  SOLN
INTRAMUSCULAR | Status: DC | PRN
  Administered 2020-01-06: 45 mL via URETHRAL

## 2020-01-06 MED ORDER — CHLORHEXIDINE GLUCONATE 0.12 % MT SOLN: 15.0000 mL | Freq: Once | OROMUCOSAL | Status: AC

## 2020-01-06 MED ORDER — SODIUM CHLORIDE 0.9 % IR SOLN
Status: DC | PRN
  Administered 2020-01-06: 6000 mL via INTRAVESICAL

## 2020-01-06 MED ORDER — ONDANSETRON HCL 4 MG/2ML IJ SOLN
INTRAMUSCULAR | Status: DC | PRN
  Administered 2020-01-06: 4 mg via INTRAVENOUS

## 2020-01-06 MED ORDER — MIDAZOLAM HCL 2 MG/2ML IJ SOLN
INTRAMUSCULAR | Status: AC
  Filled 2020-01-06: qty 2

## 2020-01-06 MED ORDER — LACTATED RINGERS IV SOLN: INTRAVENOUS | Status: DC

## 2020-01-06 SURGICAL SUPPLY — 26 items
BAG URO CATCHER STRL LF (MISCELLANEOUS) ×3 IMPLANT
BASKET LASER NITINOL 1.9FR (BASKET) ×2 IMPLANT
BASKET STONE NCOMPASS (UROLOGICAL SUPPLIES) ×2 IMPLANT
BSKT STON RTRVL 120 1.9FR (BASKET) ×1
CATH INTERMIT  6FR 70CM (CATHETERS) ×3 IMPLANT
CLOTH BEACON ORANGE TIMEOUT ST (SAFETY) ×3 IMPLANT
EXTRACTOR STONE 1.7FRX115CM (UROLOGICAL SUPPLIES) IMPLANT
GLOVE BIOGEL M STRL SZ7.5 (GLOVE) ×9 IMPLANT
GOWN STRL REUS W/TWL LRG LVL3 (GOWN DISPOSABLE) ×5 IMPLANT
GUIDEWIRE ANG ZIPWIRE 038X150 (WIRE) ×6 IMPLANT
GUIDEWIRE STR DUAL SENSOR (WIRE) ×6 IMPLANT
KIT TURNOVER KIT A (KITS) ×3 IMPLANT
LASER FIB FLEXIVA PULSE ID 365 (Laser) IMPLANT
LASER FIB FLEXIVA PULSE ID 550 (Laser) IMPLANT
LASER FIB FLEXIVA PULSE ID 910 (Laser) IMPLANT
MANIFOLD NEPTUNE II (INSTRUMENTS) ×3 IMPLANT
PACK CYSTO (CUSTOM PROCEDURE TRAY) ×3 IMPLANT
SHEATH URETERAL 12FRX28CM (UROLOGICAL SUPPLIES) IMPLANT
SHEATH URETERAL 12FRX35CM (MISCELLANEOUS) ×2 IMPLANT
STENT URET 6FRX26 CONTOUR (STENTS) ×4 IMPLANT
TRACTIP FLEXIVA PULS ID 200XHI (Laser) IMPLANT
TRACTIP FLEXIVA PULSE ID 200 (Laser)
TUBE FEEDING 8FR 16IN STR KANG (MISCELLANEOUS) ×3 IMPLANT
TUBING CONNECTING 10 (TUBING) ×2 IMPLANT
TUBING CONNECTING 10' (TUBING) ×1
TUBING UROLOGY SET (TUBING) ×3 IMPLANT

## 2020-01-06 NOTE — Brief Op Note (Signed)
01/06/2020  6:17 PM  PATIENT:  Dennis Spencer  74 y.o. male  PRE-OPERATIVE DIAGNOSIS:  BILATERAL RENAL STONES, BLADDER STONE  POST-OPERATIVE DIAGNOSIS:  bilateral renal stones  PROCEDURE:  Procedure(s) with comments: CYSTOSCOPY WITH RETROGRADE PYELOGRAM, URETEROSCOPY AND STENT EXCHANGE, RETROGRADE, BASKETING OF STONES (Bilateral) - 90 MINS  SURGEON:  Surgeon(s) and Role:    * Alexis Frock, MD - Primary  PHYSICIAN ASSISTANT:   ASSISTANTS: none   ANESTHESIA:   general  EBL:  0 mL   BLOOD ADMINISTERED:none  DRAINS: NONE   LOCAL MEDICATIONS USED:  NONE  SPECIMEN:  Source of Specimen:  bilateral renal stone fragments  DISPOSITION OF SPECIMEN:  Given to family  COUNTS:  YES  TOURNIQUET:  * No tourniquets in log *  DICTATION: .Other Dictation: Dictation Number  [id number]  PLAN OF CARE: Discharge to home after PACU  PATIENT DISPOSITION:  PACU - hemodynamically stable.   Delay start of Pharmacological VTE agent (>24hrs) due to surgical blood loss or risk of bleeding: yes

## 2020-01-06 NOTE — Anesthesia Preprocedure Evaluation (Addendum)
Anesthesia Evaluation  Patient identified by MRN, date of birth, ID band Patient awake    Reviewed: Allergy & Precautions, NPO status , Patient's Chart, lab work & pertinent test results  Airway Mallampati: III  TM Distance: >3 FB Neck ROM: Full    Dental no notable dental hx.    Pulmonary sleep apnea (tracheostomy) , former smoker,   Tracheostomy   breath sounds clear to auscultation       Cardiovascular hypertension, Pt. on medications Normal cardiovascular exam Rhythm:Regular Rate:Normal  ECG: NSR, rate 80   Neuro/Psych TIACVA, No Residual Symptoms negative psych ROS   GI/Hepatic Neg liver ROS, GERD  Medicated and Controlled,  Endo/Other  Morbid obesity  Renal/GU Renal disease     Musculoskeletal  (+) Arthritis ,   Abdominal (+) + obese,   Peds  Hematology  (+) anemia , HLD   Anesthesia Other Findings BILATERAL RENAL STONES BLADDER STONE  Reproductive/Obstetrics                            Anesthesia Physical Anesthesia Plan  ASA: IV  Anesthesia Plan: General   Post-op Pain Management:    Induction: Intravenous  PONV Risk Score and Plan: 3 and Ondansetron, Dexamethasone and Treatment may vary due to age or medical condition  Airway Management Planned: Tracheostomy  Additional Equipment:   Intra-op Plan:   Post-operative Plan: Extubation in OR  Informed Consent: I have reviewed the patients History and Physical, chart, labs and discussed the procedure including the risks, benefits and alternatives for the proposed anesthesia with the patient or authorized representative who has indicated his/her understanding and acceptance.       Plan Discussed with: CRNA  Anesthesia Plan Comments: (6.0 ett through trach)      Anesthesia Quick Evaluation

## 2020-01-06 NOTE — Discharge Instructions (Signed)
1 - You may have urinary urgency (bladder spasms) and bloody urine on / off with stent in place. This is normal.  2 - Remove tethered stents on Monday morning at home by pulling on strings, then blue plastic tubing, and discarding. There are TWO stents. Office is open Monday if any problems arise.   3 -  Call MD or go to ER for fever >102, severe pain / nausea / vomiting not relieved by medications, or acute change in medical status   General Anesthesia, Adult, Care After This sheet gives you information about how to care for yourself after your procedure. Your health care provider may also give you more specific instructions. If you have problems or questions, contact your health care provider. What can I expect after the procedure? After the procedure, the following side effects are common:  Pain or discomfort at the IV site.  Nausea.  Vomiting.  Sore throat.  Trouble concentrating.  Feeling cold or chills.  Weak or tired.  Sleepiness and fatigue.  Soreness and body aches. These side effects can affect parts of the body that were not involved in surgery. Follow these instructions at home:  For at least 24 hours after the procedure:  Have a responsible adult stay with you. It is important to have someone help care for you until you are awake and alert.  Rest as needed.  Do not: ? Participate in activities in which you could fall or become injured. ? Drive. ? Use heavy machinery. ? Drink alcohol. ? Take sleeping pills or medicines that cause drowsiness. ? Make important decisions or sign legal documents. ? Take care of children on your own. Eating and drinking  Follow any instructions from your health care provider about eating or drinking restrictions.  When you feel hungry, start by eating small amounts of foods that are soft and easy to digest (bland), such as toast. Gradually return to your regular diet.  Drink enough fluid to keep your urine pale yellow.  If  you vomit, rehydrate by drinking water, juice, or clear broth. General instructions  If you have sleep apnea, surgery and certain medicines can increase your risk for breathing problems. Follow instructions from your health care provider about wearing your sleep device: ? Anytime you are sleeping, including during daytime naps. ? While taking prescription pain medicines, sleeping medicines, or medicines that make you drowsy.  Return to your normal activities as told by your health care provider. Ask your health care provider what activities are safe for you.  Take over-the-counter and prescription medicines only as told by your health care provider.  If you smoke, do not smoke without supervision.  Keep all follow-up visits as told by your health care provider. This is important. Contact a health care provider if:  You have nausea or vomiting that does not get better with medicine.  You cannot eat or drink without vomiting.  You have pain that does not get better with medicine.  You are unable to pass urine.  You develop a skin rash.  You have a fever.  You have redness around your IV site that gets worse. Get help right away if:  You have difficulty breathing.  You have chest pain.  You have blood in your urine or stool, or you vomit blood. Summary  After the procedure, it is common to have a sore throat or nausea. It is also common to feel tired.  Have a responsible adult stay with you for the first 24  hours after general anesthesia. It is important to have someone help care for you until you are awake and alert.  When you feel hungry, start by eating small amounts of foods that are soft and easy to digest (bland), such as toast. Gradually return to your regular diet.  Drink enough fluid to keep your urine pale yellow.  Return to your normal activities as told by your health care provider. Ask your health care provider what activities are safe for you. This  information is not intended to replace advice given to you by your health care provider. Make sure you discuss any questions you have with your health care provider. Document Revised: 05/01/2017 Document Reviewed: 12/12/2016 Elsevier Patient Education  [place].

## 2020-01-06 NOTE — H&P (Signed)
Dennis Spencer is an 74 y.o. male.    Chief Complaint: Pre-OP 2nd Stage BILATERAL Ureteroscopic Stone Manipulation  HPI:    1 - Recurrent Surgical Nephrolithiasis -   2011 - PCNL for large tones  2015 - Rt URS / Lt PCNL for huge volume left renal and bilateral ureteral stones, Total stone volume >6cm2  2019 - Bilateral 3 stage URS for Lt>Rt stone 4cm2 total  2020 - Bilateral 2 stage URS for Lt>Rt stone, 3cm2 total    Recent Surveillance:  04/2019 - KUB- stone free  10/2019 - KUB/RUS - recurrent bilateral renal stones, abtou 1.4cm total each side and approx 3cm bladder stone.   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 Currently on CaCO4 with meals.   Eval 2011 - 24 hr urine - severe hyperoxaluria >170   Eval 2015 - BMP,PTH,Urate - normal, Composition - CaOx; 24 hr Urines - high oxalate (60), low citrate (160) --> CaCitrate 2080 MEQ TID meals    3 - Renal Insufficiency - Cr 1.75 after episode of severe bilateral obstruction 2015 with peak of 6. Now established with nephrology. Most recnt Cr <1.5.    PMH sig for morbid obesity, trach (since 1980s). Denies ischemic heart disease. His PCP is Dennis Fraise MD with [facility].    Today Dennis Spencer is seen to proceed with BILATERAL 2nd stage ureteroscopy for large recurrent renal stones. No interval fevers. C19 screen negative. Most recent UCX negative. He is s/p 1st stage surgery 8/13 which he tollerated well and bladder stone and majority of Rt sided renl stone adressed.    Past Medical History:  Diagnosis Date  . Anticoagulant long-term use    brilinta  . Arthritis    Knees  . Carotid stenosis, bilateral    per duplex 04-23-2018  bilateral ICA <50%  . Chronic cough    per pt this normal due to tracheostomy  . CKD (chronic kidney disease), stage III 11/10/2013  . Dyspnea   . GERD (gastroesophageal reflux disease)   . History of CVA (cerebrovascular  accident) 04/13/2018   chronic lacunar infarct--- per pt no residuals  . History of kidney stones    multiple kidney stones  . History of osteomyelitis age 84   LLE  . Hyperlipidemia   . Hypertension   . OSA (obstructive sleep apnea) study in epic 09-25-2005  severe osa   1981 s/p tracheostomy, tongue reduction and septoplasty;  LEAVES TRACH OPEN AT NIGHT FOR SLEEP APNEA  . 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 Renal calculi    bilateral  . Severe obesity (BMI >= 40) (HCC)   . Stroke [facility]) 2019   Possible TIA in the past  . Urgency of urination   . Wears glasses     Past Surgical History:  Procedure Laterality Date  . CHOLECYSTECTOMY N/A 06/15/2015   Procedure: LAPAROSCOPIC CHOLECYSTECTOMY;  Surgeon: Ralene Ok, MD;  Location: [facility];  Service: General;  Laterality: N/A;  . COLONOSCOPY  last one 03-28-2016  . CYSTOSCOPY W/ URETERAL STENT REMOVAL Right 06/26/2017   Procedure: CYSTOSCOPY WITH STENT REMOVAL;  Surgeon: Alexis Frock, MD;  Location: [facility];  Service: Urology;  Laterality: Right;  . CYSTOSCOPY W/ URETERAL STENT REMOVAL Bilateral 10/22/2018   Procedure: CYSTOSCOPY WITH STENT REMOVAL;  Surgeon: Alexis Frock, MD;  Location: [facility];  Service: Urology;  Laterality:  Bilateral;  . CYSTOSCOPY WITH LITHOLAPAXY N/A 12/23/2019   Procedure: CYSTOSCOPY WITH LITHOLAPAXY;  Surgeon: Alexis Frock, MD;  Location: [facility];  Service: Urology;  Laterality: N/A;  . CYSTOSCOPY WITH RETROGRADE PYELOGRAM, URETEROSCOPY AND STENT PLACEMENT Bilateral 06/18/2013   Procedure: CYSTOSCOPY WITH RETROGRADE PYELOGRAM, Left URETEROSCOPY with laser , AND bilateral STENT PLACEMENT;  Surgeon: Alexis Frock, MD;  Location: [facility];  Service: Urology;  Laterality: Bilateral;  . CYSTOSCOPY WITH RETROGRADE PYELOGRAM, URETEROSCOPY AND STENT PLACEMENT Right 08/08/2013   Procedure: CYSTOSCOPY WITH RIGHT RETROGRADE PYELOGRAM, URETEROSCOPY AND STENT  PLACEMENT, stone extraction;  Surgeon: Alexis Frock, MD;  [facility]: [facility];  Service: Urology;  Laterality: Right;  . CYSTOSCOPY WITH RETROGRADE PYELOGRAM, URETEROSCOPY AND STENT PLACEMENT Right 08/10/2013   Procedure: 2ND STAGE CYSTOSCOPY WITH RETROGRADE PYELOGRAM, URETEROSCOPY AND STENT EXCHANGE;  Surgeon: Alexis Frock, MD;  [facility];  Service: Urology;  Laterality: Right;  . CYSTOSCOPY WITH RETROGRADE PYELOGRAM, URETEROSCOPY AND STENT PLACEMENT Bilateral 05/22/2017   Procedure: CYSTOSCOPY WITH RETROGRADE PYELOGRAM, URETEROSCOPY AND STENT PLACEMENT;  Surgeon: Alexis Frock, MD;  [facility];  Service: Urology;  Laterality: Bilateral;  . CYSTOSCOPY WITH RETROGRADE PYELOGRAM, URETEROSCOPY AND STENT PLACEMENT Bilateral 06/05/2017   Procedure: CYSTOSCOPY WITH RETROGRADE PYELOGRAM, URETEROSCOPY AND STENT PLACEMENT;  Surgeon: Alexis Frock, MD;  [facility];  Service: Urology;  Laterality: Bilateral;  . CYSTOSCOPY WITH RETROGRADE PYELOGRAM, URETEROSCOPY AND STENT PLACEMENT Bilateral 08/13/2018   Procedure: CYSTOSCOPY WITH RETROGRADE PYELOGRAM, URETEROSCOPY AND STENT PLACEMENT;  Surgeon: Alexis Frock, MD;  [facility];  Service: Urology;  Laterality: Bilateral;  90 MINS  . CYSTOSCOPY WITH RETROGRADE PYELOGRAM, URETEROSCOPY AND STENT PLACEMENT Bilateral 10/22/2018   Procedure: CYSTOSCOPY WITH RETROGRADE PYELOGRAM, URETEROSCOPY  STONE BASKETRY AND STENT PLACEMENT;  Surgeon: Alexis Frock, MD;  [facility];  Service: Urology;  Laterality: Bilateral;  . CYSTOSCOPY WITH RETROGRADE PYELOGRAM, URETEROSCOPY AND STENT PLACEMENT Bilateral 12/23/2019   Procedure: CYSTOSCOPY WITH RETROGRADE PYELOGRAM, URETEROSCOPY AND STENT PLACEMENT;  Surgeon: Alexis Frock, MD;  [facility];  Service: Urology;  Laterality: Bilateral;  90 MINS  . CYSTOSCOPY/URETEROSCOPY/HOLMIUM LASER/STENT PLACEMENT Left 06/26/2017   Procedure: CYSTOSCOPY/URETEROSCOPY third stage/HOLMIUM LASER/STENT PLACEMENT  retrograde pylegram;  Surgeon: Alexis Frock, MD;  [facility];  Service: Urology;  Laterality: Left;  Marland Kitchen GASTRIC ROUX-EN-Y  2004    @[facility]   . HOLMIUM LASER APPLICATION Bilateral 12/10/8297   Procedure: HOLMIUM LASER APPLICATION;  Surgeon: Alexis Frock, MD;  [facility];  Service: Urology;  Laterality: Bilateral;  . HOLMIUM LASER APPLICATION Left 07/17/1694   Procedure: HOLMIUM LASER APPLICATION;  Surgeon: Alexis Frock, MD;  [facility];  Service: Urology;  Laterality: Left;  . HOLMIUM LASER APPLICATION Bilateral 7/89/3810   Procedure: HOLMIUM LASER APPLICATION;  Surgeon: Alexis Frock, MD;  [facility];  Service: Urology;  Laterality: Bilateral;  . HOLMIUM LASER APPLICATION Bilateral 05/18/5100   Procedure: HOLMIUM LASER APPLICATION;  Surgeon: Alexis Frock, MD;  [facility];  Service: Urology;  Laterality: Bilateral;  . HOLMIUM LASER APPLICATION Bilateral 5/85/2778   Procedure: HOLMIUM LASER APPLICATION;  Surgeon: Alexis Frock, MD;  [facility];  Service: Urology;  Laterality: Bilateral;  . HOLMIUM LASER APPLICATION Bilateral 2/42/3536   Procedure: HOLMIUM LASER APPLICATION;  Surgeon: Alexis Frock, MD;  [facility];  Service: Urology;  Laterality: Bilateral;  . NEPHROLITHOTOMY Left 08/08/2013   Procedure: 1ST STAGE NEPHROLITHOTOMY PERCUTANEOUS WITH SURGEON ACCESS;  Surgeon: Alexis Frock, MD;  Location: [facility];  Service: Urology;  Laterality: Left;  . NEPHROLITHOTOMY Left 08/10/2013  Procedure: NEPHROLITHOTOMY PERCUTANEOUS SECOND LOOK/LEFT DIGITAL URETEROSCOPY/BASKETING OF STONE/EXCHANGE OF LEFT URETERAL STENT;  Surgeon: Alexis Frock, MD;  Location: [facility];  Service: Urology;  Laterality: Left;  . ORCHIOPEXY  child   unlateral undescended testis  . PERCUTANEOUS NEPHROSTOLITHOTOMY Left 09-19-2009   dr dahlstedt  @[facility]   . TONSILLECTOMY  child  . TRACHEOSTOMY  1987   s/p tongue reduction and nasal septum deviation repair for  Sleep apnea   .  URETEROSCOPY WITH HOLMIUM LASER LITHOTRIPSY Left 04-14-2002   dr Diona Fanti  @[facility]   . WISDOM TOOTH EXTRACTION      Family History  Problem Relation Age of Onset  . Cancer Mother        Ovarian, died age 66.  . Diabetes Father   . Hypertension Father   . Heart failure Father        Died of MI age 22.  . Breast cancer Maternal Grandmother   . Colon cancer Maternal Grandfather   . Esophageal cancer Neg Hx   . Stomach cancer Neg Hx   . Rectal cancer Neg Hx    Social History:  reports that he quit smoking about 51 years ago. His smoking use included cigarettes. He has a 8.00 pack-year smoking history. He has never used smokeless tobacco. He reports previous alcohol use. He reports that he does not use drugs.  Allergies:  Allergies  Allergen Reactions  . Lisinopril Other (See Comments)    DIZZINESS    No medications prior to admission.    Results for orders placed or performed during the hospital encounter of 01/04/20 (from the past 48 hour(s))  SARS CORONAVIRUS 2 (TAT 6-24 HRS) Nasopharyngeal Nasopharyngeal Swab     Status: None   Collection Time: 01/04/20  1:56 PM   Specimen: Nasopharyngeal Swab  Result Value Ref Range   SARS Coronavirus 2 NEGATIVE NEGATIVE    Comment: (NOTE) SARS-CoV-2 target nucleic acids are NOT DETECTED.  The SARS-CoV-2 RNA is generally detectable in upper and lower respiratory specimens during the acute phase of infection. Negative results do not preclude SARS-CoV-2 infection, do not rule out co-infections with other pathogens, and should not be used as the sole basis for treatment or other patient management decisions. Negative results must be combined with clinical observations, patient history, and epidemiological information. The expected result is Negative.  Fact Sheet for Patients: SugarRoll.be  Fact Sheet for Healthcare Providers: https://www.woods-mathews.com/  This test is not yet approved or cleared  by the [place] FDA and  has been authorized for detection and/or diagnosis of SARS-CoV-2 by FDA under an Emergency Use Authorization (EUA). This EUA will remain  in effect (meaning this test can be used) for the duration of the COVID-19 declaration under Se ction 564(b)(1) of the Act, 21 U.S.C. section 360bbb-3(b)(1), unless the authorization is terminated or revoked sooner.  Performed at [facility], [place] [address] [place], [place] [address]    No results found.  Review of Systems  Constitutional: Negative for chills and fever.  All other systems reviewed and are negative.   There were no vitals taken for this visit. Physical Exam Vitals reviewed.  HENT:     Head: Normocephalic.     Comments: Stable clean trach with PMV    Mouth/Throat:     Mouth: Mucous membranes are moist.  Eyes:     Pupils: Pupils are equal, round, and reactive to light.  Cardiovascular:     Rate and Rhythm: Normal rate.  Pulmonary:     Effort:  Pulmonary effort is normal.  Abdominal:     Comments: Stable morbid truncal obesity.   Genitourinary:    Comments: NO CVAT at present.  Skin:    General: Skin is warm.  Neurological:     General: No focal deficit present.     Mental Status: He is alert.  Psychiatric:        Mood and Affect: Mood normal.      Assessment/Plan  Proceed as planned with BILATERAL 2nd stage ureteroscopic stone manipulation with goal of stone free. Risks, benefits, alternatives, expected peri-op course discussed previously and reiterated today.   Alexis Frock, MD 01/06/2020, 7:06 AM

## 2020-01-06 NOTE — Transfer of Care (Signed)
Immediate Anesthesia Transfer of Care Note  Patient: Dennis Spencer  Procedure(s) Performed: CYSTOSCOPY WITH RETROGRADE PYELOGRAM, URETEROSCOPY AND STENT EXCHANGE, RETROGRADE, BASKETING OF STONES (Bilateral )  Patient Location: PACU  Anesthesia Type:General  Level of Consciousness: awake and alert   Airway & Oxygen Therapy: Patient Spontanous Breathing and to trach collar  Post-op Assessment: Report given to RN and Post -op Vital signs reviewed and stable  Post vital signs: Reviewed and stable  Last Vitals:  Vitals Value Taken Time  BP 141/66 01/06/20 1818  Temp 36.4 C 01/06/20 1818  Pulse 65 01/06/20 1824  Resp 8 01/06/20 1824  SpO2 100 % 01/06/20 1824  Vitals shown include unvalidated device data.  Last Pain:  Vitals:   01/06/20 1818  TempSrc:   PainSc: 0-No pain      Patients Stated Pain Goal: 3 (53/20/23 3435)  Complications: No complications documented.

## 2020-01-06 NOTE — Anesthesia Procedure Notes (Addendum)
Procedure Name: Intubation Date/Time: 01/06/2020 5:00 PM Performed by: Lissa Morales, CRNA Pre-anesthesia Checklist: Patient identified, Emergency Drugs available, Suction available, Patient being monitored and Timeout performed Patient Re-evaluated:Patient Re-evaluated prior to induction Oxygen Delivery Method: Circle system utilized Preoxygenation: Pre-oxygenation with 100% oxygen Placement Confirmation: positive ETCO2 Comments: Placed #6 anode ETT smoothly thru stoma of trach. Passed easily without trauma. Taped  14cm at skin on stoma

## 2020-01-06 NOTE — Anesthesia Postprocedure Evaluation (Signed)
Anesthesia Post Note  Patient: Dennis Spencer  Procedure(s) Performed: CYSTOSCOPY WITH RETROGRADE PYELOGRAM, URETEROSCOPY AND STENT EXCHANGE, RETROGRADE, BASKETING OF STONES (Bilateral )     Patient location during evaluation: PACU Anesthesia Type: General Level of consciousness: awake Pain management: pain level controlled Vital Signs Assessment: post-procedure vital signs reviewed and stable Respiratory status: spontaneous breathing, nonlabored ventilation, respiratory function stable and patient connected to T-piece oxygen Cardiovascular status: blood pressure returned to baseline and stable Postop Assessment: no apparent nausea or vomiting Anesthetic complications: no   No complications documented.  Last Vitals:  Vitals:   01/06/20 1845 01/06/20 1849  BP: (!) 161/74 (!) 153/74  Pulse: 80 78  Resp: 16 14  Temp:    SpO2: 100% 100%    Last Pain:  Vitals:   01/06/20 1845  TempSrc:   PainSc: 0-No pain                 Willmer Fellers P Inocente Krach

## 2020-01-07 ENCOUNTER — Encounter (HOSPITAL_COMMUNITY): Payer: Self-pay | Admitting: Urology

## 2020-01-07 NOTE — Op Note (Signed)
NAME: Dennis Spencer, MANGEL MEDICAL RECORD [id number] ACCOUNT [id number] DATE OF BIRTH:09-21-1945 FACILITY: [facility] LOCATION: [facility] PHYSICIAN:Othal Kubitz Tresa Moore, MD  OPERATIVE REPORT  DATE OF PROCEDURE:  01/06/2020  SURGEON:  Alexis Frock, MD  PREOPERATIVE DIAGNOSIS:  Recurrent bilateral kidney stones.  PROCEDURE: 1.  Cystoscopy, bilateral retrograde pyelograms, interpretation. 2.  Second stage bilateral ureteroscopy with basketing of stones. 3.  Exchange of bilateral stents, 6 x 26 Contour, no tether.  ESTIMATED BLOOD LOSS:  Nil.  MEDICATIONS:  None.  SPECIMEN:  Bilateral renal stone fragments for composition analysis.  FINDINGS: 1.  Left greater than right intrarenal stones, left side approximately 8 mm2, right side approximately 2 mm2. 2.  Complete resolution of all accessible stone fragments larger than one-third mm following basket extraction. 3.  Successful placement of bilateral ureteral stents, proximal end in renal pelvis, distal end in urinary bladder.  INDICATIONS:  The patient is a very pleasant but unfortunate 74 year old man with history of mild absorptive hyperoxaluria, status-post gastric bypass with rapidly recurrent kidney stones despite maximal medical therapy.  He was found on surveillance imaging to  have fairly significant volume recurrence of bilateral stones over a centimeter each side.  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  He wished to proceed with elective staged ureteroscopy.   He underwent first stage  procedure several weeks ago, at which time the right side was the main focus, as well as a large bladder stone.  He tolerated this well.  He  has had interval stents to allow clearance of fragments.  He presents today for a second-stage procedure.  Informed consent was obtained and placed in  the medical record.  DESCRIPTION OF PROCEDURE:  The patient being identified, the procedure being bilateral second-stage ureteroscopic stone manipulation was confirmed.  Procedure timeout was performed.  Intravenous antibiotics were administered.  General endotracheal  anesthesia was introduced via his tracheostomy tract.  A sterile field was created by carefully prepping and draping base of the penis, perineum and proximal thighs using iodine.  Notably, his penis was quite buried.  He is uncircumcised.   Cystourethroscopy was performed using 21-French rigid cystoscope with offset lens.  Inspection of the anterior and posterior urethra revealed a relatively high-riding bladder neck as previously noted.  Distal and bilateral ureteral stents were seen in  situ.  They were sequentially grasped in their entirety, set aside for discard, inspected and intact.  The right ureteral orifice was then cannulated with a 6-French renal catheter and right retrograde pyelogram was obtained.  Right retrograde pyelogram demonstrated a single right ureter, single system right kidney.  There was some mild to moderate hydronephrosis as per baseline.  A 0.03 ZIPwire was advanced to the lower pole and set aside as a safety wire.  Similarly, left  retrograde pyelogram was obtained.  Left retrograde pyelogram demonstrated a single left ureter, single system left kidney.  No filling defects or narrowing noted.   A separate ZIPwire was advanced to lower pole and set aside as a safety wire.  An 8-French feeding tube was placed in the  urinary bladder for pressure release and semirigid ureteroscopy performed in the distal four-fifths of the right ureter alongside a separate sensor working wire.  No mucosal abnormalities were found.  Next, semirigid ureteroscopy was performed of the  distal four-fifths of the left ureter alongside a separate sensor working wire.  No mucosal abnormalities or  stone fragments were found.  The semirigid  scope was exchanged for a 12/14, 35 cm ureteral access sheath at the level of the proximal ureter on  the left side.  Using continuous fluoroscopic guidance, flexible digital ureteroscopy was performed of the proximal left ureter, systematic inspection of the left kidney, including all calices x3.  There was a moderate volume intrarenal stone, mostly in  the mid and lower pole calices.  Total stone volume approximately 8 mm2.  Fortunately, all of these actually did appear amenable to simple basketing.  Using an Escape basket, the dominant fragments were removed, set aside to be given to patient.  There  were several smaller fragments that were approximately 0.5 mm or so in size in diameter and using an NCompass basket with a net technique, these fragments were grasped sequentially and removed and set aside.  Following this, complete resolution of all  accessible stone fragments larger than one-third mm there was with excellent hemostasis.  No evidence of renal perforation.  The access sheath removed under continuous vision, no mucosal abnormalities were found.  Next, the access sheath was placed over  the right sensor working wire to the level of the right proximal ureter using continuous fluoroscopic guidance and flexible digital ureteroscopy was performed of the proximal ureter and systematic inspection of the right kidney, including all calices x3.   There had been significant interval clearance of stone material with only several foci of small volume stones noted, that again using the NCompass basket were amenable to simple basketing.  Total volume approximately 3 mL2.  The access sheath was then  removed on the right side and no significant mucosal abnormalities were found.  Given the bilateral nature of the procedure today, it was felt that brief interval stenting with tethered stents would be warranted.  As such new 6 x 26 Contour-type stent  was placed with remaining safety wires using fluoroscopic  guidance.  Good proximal and distal planes were noted.  Tethers were left in place and fashioned to the inner thigh and the procedure was terminated.  The patient tolerated the procedure well.  No  immediate perioperative complications.  The patient was taken to postanesthesia care unit in stable condition with plan for discharge home.  VN/NUANCE  D:01/06/2020 T:01/06/2020 JOB:[id number]

## 2020-01-09 ENCOUNTER — Other Ambulatory Visit: Payer: Self-pay | Admitting: Family Medicine

## 2020-01-09 NOTE — Telephone Encounter (Signed)
Stacks. NTBS 30 days given 12/05/19

## 2020-01-09 NOTE — Telephone Encounter (Signed)
Pt is aware he ntbs and has enough to last him a few weeks but he will schedule on his MyChart.

## 2020-01-23 ENCOUNTER — Other Ambulatory Visit: Payer: Self-pay

## 2020-01-23 ENCOUNTER — Encounter: Payer: Self-pay | Admitting: Dietician

## 2020-01-23 ENCOUNTER — Encounter: Payer: Medicare HMO | Attending: Family Medicine | Admitting: Dietician

## 2020-01-23 DIAGNOSIS — G8929 Other chronic pain: Secondary | ICD-10-CM | POA: Insufficient documentation

## 2020-01-23 DIAGNOSIS — M25561 Pain in right knee: Secondary | ICD-10-CM | POA: Diagnosis not present

## 2020-01-23 DIAGNOSIS — M25562 Pain in left knee: Secondary | ICD-10-CM | POA: Insufficient documentation

## 2020-01-23 DIAGNOSIS — Z713 Dietary counseling and surveillance: Secondary | ICD-10-CM | POA: Diagnosis not present

## 2020-01-23 NOTE — Patient Instructions (Addendum)
Try to find a pool that you can wade in waist high water.  Begin resistance band exercises again, a couple of days a week.  Try the [employer] 100 Calorie Multigrain English Muffin  Eat steam-in-bag vegetables.  Work in some more fruit. Try it with some greek yogurt. Danon Light n' Fit.  Get yourself some divided plates that match the Balanced Plate model as a reference.

## 2020-01-23 NOTE — Progress Notes (Signed)
  Medical Nutrition Therapy:  Appt start time: 1610 end time:  9604.   Assessment:  Primary concerns today: Weight loss.   Pt has a Oceanographer in music. Pt has had 2 kidney stones in the last year. Pt has had RYGB and takes his MV and calcium as he should. Pt was 420 before gastric bypass, and got down to 265 after about 2 years. He has put about 70 pounds back on in the last 3 years. Pt wants to lose weight. Pt has no cartalidge in both knees and has had no success with any treatments. Physical therapy did not help. Pt has a problem with salty snack foods. Pt used to do seated resistance band exercises, but lost interest. States he can walk for a while, but is in pain when he stands still.   Preferred Learning Style:    No preference indicated   Learning Readiness:    Ready    MEDICATIONS: Lipitor, Norvasc, Protonix, Bariatric MV, Vit C, Vit D   DIETARY INTAKE:  Usual eating pattern includes 2 meals and 3-4 snacks per day.  Everyday foods include Salty snacks, chips, cheetos.  Avoided foods include raw onion, milk upsets his stomach  24-hr recall:  B (5 AM): coffee, cake donut  Snk ( AM): english muffin with egg salad and cheese  Snk ( PM): chips, cheetos D ( PM): none Snk ( PM): none Beverages: water, coffee  Usual physical activity: ADLs  Estimated energy needs: 1800 calories 200 g carbohydrates 135 g protein 50 g fat  Progress Towards Goal(s):  In progress.   Nutritional Diagnosis:  NB-1.1 Food and nutrition-related knowledge deficit As related to obesity.  As evidenced by BMI of 48.58, 24 hr dietary recall..    Intervention:  Nutrition Education.  Educated patient on the balanced plate eating model. Recommended lunch and dinner be 1/2 non-starchy vegetables, 1/4 starches, and 1/4 protein. Recommended breakfast be a balance of starch and protein with a piece of fruit. Discussed with patient the importance of working towards hitting the proportions of the  balanced plate consistently. Counseled patient on ways to begin recognizing each of the food groups from the balanced plate in their own meals, and how close they are to fitting the recommended proportions of the balanced plate. Educated patient on the nutritional value of each food group on the balanced plate model. Counseled patient on beginning to rebuild their trust in themselves to make the right food choices for their health. Educated patient on mindful eating, including listening to their body's hunger and satiety cues, as well as eating slowly and allowing meals to be more of a sensory experience. Counseled patient on allowing themselves to be present in their emotions when they consider emotional eating. Advised patient to evaluate whether the impulse to eat is hunger based, or emotionally driven.   Teaching Method Utilized:  Visual Auditory Hands on  Handouts given during visit include:  Weight Loss Instructions   MyPlate  Barriers to learning/adherence to lifestyle change: None  Demonstrated degree of understanding via:  Teach Back   Monitoring/Evaluation:  Dietary intake, exercise, and body weight in 1 month(s).

## 2020-01-24 DIAGNOSIS — R82992 Hyperoxaluria: Secondary | ICD-10-CM | POA: Diagnosis not present

## 2020-01-24 DIAGNOSIS — N200 Calculus of kidney: Secondary | ICD-10-CM | POA: Diagnosis not present

## 2020-01-31 ENCOUNTER — Other Ambulatory Visit: Payer: Self-pay

## 2020-01-31 ENCOUNTER — Encounter: Payer: Self-pay | Admitting: Family Medicine

## 2020-01-31 ENCOUNTER — Ambulatory Visit (INDEPENDENT_AMBULATORY_CARE_PROVIDER_SITE_OTHER): Payer: Medicare HMO | Admitting: Family Medicine

## 2020-01-31 VITALS — BP 118/60 | HR 76 | Temp 97.7°F | Ht 70.0 in | Wt 335.2 lb

## 2020-01-31 DIAGNOSIS — I10 Essential (primary) hypertension: Secondary | ICD-10-CM

## 2020-01-31 DIAGNOSIS — Z1159 Encounter for screening for other viral diseases: Secondary | ICD-10-CM

## 2020-01-31 DIAGNOSIS — M25562 Pain in left knee: Secondary | ICD-10-CM

## 2020-01-31 DIAGNOSIS — G8929 Other chronic pain: Secondary | ICD-10-CM

## 2020-01-31 DIAGNOSIS — R739 Hyperglycemia, unspecified: Secondary | ICD-10-CM

## 2020-01-31 DIAGNOSIS — Z23 Encounter for immunization: Secondary | ICD-10-CM

## 2020-01-31 DIAGNOSIS — K219 Gastro-esophageal reflux disease without esophagitis: Secondary | ICD-10-CM

## 2020-01-31 DIAGNOSIS — M25561 Pain in right knee: Secondary | ICD-10-CM

## 2020-01-31 DIAGNOSIS — E7800 Pure hypercholesterolemia, unspecified: Secondary | ICD-10-CM | POA: Diagnosis not present

## 2020-01-31 LAB — BAYER DCA HB A1C WAIVED: HB A1C (BAYER DCA - WAIVED): 5.8 % (ref <7.0)

## 2020-01-31 MED ORDER — TICAGRELOR 60 MG PO TABS
60.0000 mg | ORAL_TABLET | Freq: Two times a day (BID) | ORAL | 1 refills | Status: DC
Start: 2020-01-31 — End: 2020-08-03

## 2020-01-31 MED ORDER — PANTOPRAZOLE SODIUM 40 MG PO TBEC
40.0000 mg | DELAYED_RELEASE_TABLET | Freq: Every day | ORAL | 3 refills | Status: DC
Start: 2020-01-31 — End: 2020-09-12

## 2020-01-31 MED ORDER — GABAPENTIN 600 MG PO TABS
1200.0000 mg | ORAL_TABLET | Freq: Every day | ORAL | 3 refills | Status: DC
Start: 2020-01-31 — End: 2020-09-12

## 2020-01-31 MED ORDER — AMLODIPINE BESYLATE 5 MG PO TABS: 5.0000 mg | ORAL_TABLET | Freq: Every morning | ORAL | 1 refills | Status: DC

## 2020-01-31 MED ORDER — ATORVASTATIN CALCIUM 80 MG PO TABS: 80.0000 mg | ORAL_TABLET | Freq: Every day | ORAL | 3 refills | Status: DC

## 2020-01-31 NOTE — Progress Notes (Signed)
Subjective:  Patient ID: Dennis Spencer, male    DOB: April 03, 1946  Age: 74 y.o. MRN: [id number]  CC: Follow-up   HPI Dennis Spencer presents for  follow-up of hypertension. Patient has no history of headache chest pain or shortness of breath or recent cough. Patient also denies symptoms of TIA such as focal numbness or weakness. Patient denies side effects from medication. States taking it regularly.   in for follow-up of elevated cholesterol. Doing well without complaints on current medication. Denies side effects of statin including myalgia and arthralgia and nausea. Currently no chest pain, shortness of breath or other cardiovascular related symptoms noted.  Patient in for follow-up of GERD. Currently asymptomatic taking  PPI daily. There is no chest pain or heartburn. No hematemesis and no melena. No dysphagia or choking. Onset is remote. Progression is stable. Complicating factors, none.  Patient says that his knee pain reaches 10/10 but the gabapentin does help reduce it.He is trying to work on weight reduction so that he can have knee replacement surgery.  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   He has a past surgical history that includes Cystoscopy with retrograde pyelogram, ureteroscopy and stent placement (Bilateral, 06/18/2013); Holmium laser application (Bilateral, 06/18/2013); Tracheostomy (1987); Nephrolithotomy (Left,  08/08/2013); Cystoscopy with retrograde pyelogram, ureteroscopy and stent placement (Right, 08/08/2013); Nephrolithotomy (Left, 08/10/2013); Cystoscopy with retrograde pyelogram, ureteroscopy and stent placement (Right, 08/10/2013); Holmium laser application (Left, 11/17/3901); Cholecystectomy (N/A, 06/15/2015); Colonoscopy (last one 03-28-2016); Cystoscopy with retrograde pyelogram, ureteroscopy and stent placement (Bilateral, 05/22/2017); Holmium laser application (Bilateral, 05/22/2017); Cystoscopy with retrograde pyelogram, ureteroscopy and stent placement (Bilateral, 06/05/2017); Cystoscopy/ureteroscopy/holmium laser/stent placement (Left, 06/26/2017); Cystoscopy w/ ureteral stent removal (Right, 06/26/2017); Tonsillectomy (child); Gastric Roux-En-Y (2004    @[facility] ); Percutaneous nephrostolithotomy (Left, 09-19-2009   dr Diona Fanti  @[facility] ); Ureteroscopy with holmium laser lithotripsy (Left, 04-14-2002   dr Diona Fanti  @[facility] ); Orchiopexy (child); Cystoscopy with retrograde pyelogram, ureteroscopy and stent placement (Bilateral, 08/13/2018); Holmium laser application (Bilateral, 08/13/2018); Wisdom tooth extraction; Cystoscopy with retrograde pyelogram, ureteroscopy and stent placement (Bilateral, 10/22/2018); Holmium laser application (Bilateral, 10/22/2018); Cystoscopy w/ ureteral stent removal (Bilateral, 10/22/2018); Cystoscopy with retrograde pyelogram, ureteroscopy and stent placement (Bilateral, 12/23/2019); Holmium laser application (Bilateral, 12/23/2019); Cystoscopy with litholapaxy (N/A, 12/23/2019); and Cystoscopy with retrograde pyelogram, ureteroscopy and stent placement (Bilateral, 01/06/2020).   His family history includes Breast cancer in his maternal grandmother; Cancer in his mother; Colon cancer in his maternal grandfather; Diabetes in his father; Heart failure in his father; Hypertension in his father.He reports that he quit smoking about 51 years ago. His smoking use included cigarettes. He has a 8.00 pack-year smoking  history. He has never used smokeless tobacco. He reports previous alcohol use. He reports that he does not use drugs.  Current Outpatient Medications on File Prior to Visit  Medication Sig Dispense Refill  . b complex vitamins tablet Take 1 tablet by mouth daily.    . Calcium Citrate-Vitamin D (CITRACAL + D PO) Take 1 tablet by mouth 3 (three) times daily.     . Cholecalciferol (VITAMIN D3) 2000  UNITS capsule Take 2,000 Units by mouth 2 (two) times daily.     . Multiple Vitamin (MULTIVITAMIN WITH MINERALS) TABS tablet Take 1 tablet by mouth daily.    . vitamin C (ASCORBIC ACID) 500 MG tablet Take 500 mg by mouth 2 (two) times daily.     No current facility-administered medications on file prior to visit.    ROS Review of Systems  Constitutional: Negative for fever.  Respiratory: Negative for shortness of breath.   Cardiovascular: Negative for chest pain.  Musculoskeletal: Positive for arthralgias (knees).  Skin: Negative for rash.    Objective:  BP 118/60   Pulse 76   Temp 97.7 F (36.5 C) (Temporal)   Ht 5' 10"  (1.778 m)   Wt (!) 335 lb 3.2 oz (152 kg)   BMI 48.10 kg/m   BP Readings from Last 3 Encounters:  01/31/20 118/60  01/06/20 (!) 153/74  01/04/20 (!) 163/69    Wt Readings from Last 3 Encounters:  01/31/20 (!) 335 lb 3.2 oz (152 kg)  01/23/20 (!) 338 lb 9.6 oz (153.6 kg)  01/04/20 (!) 334 lb (151.5 kg)     Physical Exam Vitals reviewed.  Constitutional:      Appearance: He is well-developed. He is obese.  HENT:     Head: Normocephalic and atraumatic.     Right Ear: Tympanic membrane and external ear normal. No decreased hearing noted.     Left Ear: Tympanic membrane and external ear normal. No decreased hearing noted.     Mouth/Throat:     Pharynx: No oropharyngeal exudate or posterior oropharyngeal erythema.  Eyes:     Pupils: Pupils are equal, round, and reactive to light.  Cardiovascular:     Rate and Rhythm: Normal rate and regular rhythm.      Heart sounds: No murmur heard.   Pulmonary:     Effort: No respiratory distress.     Breath sounds: Normal breath sounds.  Abdominal:     General: Bowel sounds are normal.     Palpations: Abdomen is soft. There is no mass.     Tenderness: There is no abdominal tenderness.  Musculoskeletal:     Cervical back: Normal range of motion and neck supple.       Assessment & Plan:   Graden was seen today for follow-up.  Diagnoses and all orders for this visit:  Gastroesophageal reflux disease without esophagitis -     CBC with Differential/Platelet -     CMP14+EGFR  Essential hypertension -     amLODipine (NORVASC) 5 MG tablet; Take 1 tablet (5 mg total) by mouth in the morning. -     CBC with Differential/Platelet -     CMP14+EGFR  Hypercholesteremia -     atorvastatin (LIPITOR) 80 MG tablet; Take 1 tablet (80 mg total) by mouth daily. -     CBC with Differential/Platelet -     CMP14+EGFR -     Lipid panel  Hyperglycemia -     Bayer DCA Hb A1c Waived -     CBC with Differential/Platelet -     CMP14+EGFR  Severe obesity (BMI >= 40) (HCC) -     CBC with Differential/Platelet -     CMP14+EGFR -     TSH  Bilateral chronic knee pain -     CBC with Differential/Platelet -     CMP14+EGFR  Need for hepatitis C screening test -     CBC with Differential/Platelet -     CMP14+EGFR -  Hepatitis C antibody  Need for immunization against influenza -     Flu Vaccine QUAD High Dose(Fluad)  Other orders -     gabapentin (NEURONTIN) 600 MG tablet; Take 2 tablets (1,200 mg total) by mouth daily. -     pantoprazole (PROTONIX) 40 MG tablet; Take 1 tablet (40 mg total) by mouth daily. For stomach -     ticagrelor (BRILINTA) 60 MG TABS tablet; Take 1 tablet (60 mg total) by mouth 2 (two) times daily. -     Tdap vaccine greater than or equal to 7yo IM   Allergies as of 01/31/2020      Reactions   Lisinopril Other (See Comments)   DIZZINESS      Medication List        Accurate as of January 31, 2020 10:21 PM. If you have any questions, ask your nurse or doctor.        STOP taking these medications   cephALEXin 500 MG capsule Commonly known as: Keflex Stopped by: Claretta Fraise, MD   ketorolac 10 MG tablet Commonly known as: TORADOL Stopped by: Claretta Fraise, MD   oxyCODONE-acetaminophen 5-325 MG tablet Commonly known as: Percocet Stopped by: Claretta Fraise, MD   senna-docusate 8.6-50 MG tablet Commonly known as: Senokot-S Stopped by: Claretta Fraise, MD     TAKE these medications   amLODipine 5 MG tablet Commonly known as: NORVASC Take 1 tablet (5 mg total) by mouth in the morning.   atorvastatin 80 MG tablet Commonly known as: LIPITOR Take 1 tablet (80 mg total) by mouth daily.   b complex vitamins tablet Take 1 tablet by mouth daily.   CITRACAL + D PO Take 1 tablet by mouth 3 (three) times daily.   gabapentin 600 MG tablet Commonly known as: NEURONTIN Take 2 tablets (1,200 mg total) by mouth daily. What changed:   how much to take  when to take this Changed by: Claretta Fraise, MD   multivitamin with minerals Tabs tablet Take 1 tablet by mouth daily.   pantoprazole 40 MG tablet Commonly known as: PROTONIX Take 1 tablet (40 mg total) by mouth daily. For stomach   ticagrelor 60 MG Tabs tablet Commonly known as: Brilinta Take 1 tablet (60 mg total) by mouth 2 (two) times daily. What changed: additional instructions Changed by: Claretta Fraise, MD   vitamin C 500 MG tablet Commonly known as: ASCORBIC ACID Take 500 mg by mouth 2 (two) times daily.   Vitamin D3 50 MCG (2000 UT) capsule Take 2,000 Units by mouth 2 (two) times daily.       Meds ordered this encounter  Medications  . amLODipine (NORVASC) 5 MG tablet    Sig: Take 1 tablet (5 mg total) by mouth in the morning.    Dispense:  90 tablet    Refill:  1  . atorvastatin (LIPITOR) 80 MG tablet    Sig: Take 1 tablet (80 mg total) by mouth daily.    Dispense:   90 tablet    Refill:  3  . gabapentin (NEURONTIN) 600 MG tablet    Sig: Take 2 tablets (1,200 mg total) by mouth daily.    Dispense:  180 tablet    Refill:  3  . pantoprazole (PROTONIX) 40 MG tablet    Sig: Take 1 tablet (40 mg total) by mouth daily. For stomach    Dispense:  90 tablet    Refill:  3  . ticagrelor (BRILINTA) 60 MG TABS tablet    Sig:  Take 1 tablet (60 mg total) by mouth 2 (two) times daily.    Dispense:  180 tablet    Refill:  1      Follow-up: Return in about 6 months (around 07/30/2020).  Claretta Fraise, M.D.

## 2020-02-01 LAB — CBC WITH DIFFERENTIAL/PLATELET
Basophils Absolute: 0 10*3/uL (ref 0.0–0.2)
Basos: 1 %
EOS (ABSOLUTE): 0.2 10*3/uL (ref 0.0–0.4)
Eos: 5 %
Hematocrit: 39.5 % (ref 37.5–51.0)
Hemoglobin: 12.5 g/dL — ABNORMAL LOW (ref 13.0–17.7)
Immature Grans (Abs): 0 10*3/uL (ref 0.0–0.1)
Immature Granulocytes: 0 %
Lymphocytes Absolute: 1.4 10*3/uL (ref 0.7–3.1)
Lymphs: 26 %
MCH: 27.1 pg (ref 26.6–33.0)
MCHC: 31.6 g/dL (ref 31.5–35.7)
MCV: 86 fL (ref 79–97)
Monocytes Absolute: 0.6 10*3/uL (ref 0.1–0.9)
Monocytes: 12 %
Neutrophils Absolute: 3 10*3/uL (ref 1.4–7.0)
Neutrophils: 56 %
Platelets: 278 10*3/uL (ref 150–450)
RBC: 4.61 x10E6/uL (ref 4.14–5.80)
RDW: 14.5 % (ref 11.6–15.4)
WBC: 5.3 10*3/uL (ref 3.4–10.8)

## 2020-02-01 LAB — CMP14+EGFR
ALT: 16 IU/L (ref 0–44)
AST: 23 IU/L (ref 0–40)
Albumin/Globulin Ratio: 1.4 (ref 1.2–2.2)
Albumin: 4 g/dL (ref 3.7–4.7)
Alkaline Phosphatase: 105 IU/L (ref 44–121)
BUN/Creatinine Ratio: 11 (ref 10–24)
BUN: 17 mg/dL (ref 8–27)
Bilirubin Total: 0.6 mg/dL (ref 0.0–1.2)
CO2: 23 mmol/L (ref 20–29)
Calcium: 9.3 mg/dL (ref 8.6–10.2)
Chloride: 104 mmol/L (ref 96–106)
Creatinine, Ser: 1.59 mg/dL — ABNORMAL HIGH (ref 0.76–1.27)
GFR calc Af Amer: 49 mL/min/{1.73_m2} — ABNORMAL LOW (ref >59)
GFR calc non Af Amer: 42 mL/min/{1.73_m2} — ABNORMAL LOW (ref >59)
Globulin, Total: 2.8 g/dL (ref 1.5–4.5)
Glucose: 113 mg/dL — ABNORMAL HIGH (ref 65–99)
Potassium: 5.3 mmol/L — ABNORMAL HIGH (ref 3.5–5.2)
Sodium: 141 mmol/L (ref 134–144)
Total Protein: 6.8 g/dL (ref 6.0–8.5)

## 2020-02-01 LAB — HEPATITIS C ANTIBODY: Hep C Virus Ab: <0.1 s/co ratio (ref 0.0–0.9)

## 2020-02-01 LAB — LIPID PANEL
Chol/HDL Ratio: 3.3 ratio (ref 0.0–5.0)
Cholesterol, Total: 147 mg/dL (ref 100–199)
HDL: 44 mg/dL (ref >39)
LDL Chol Calc (NIH): 77 mg/dL (ref 0–99)
Triglycerides: 151 mg/dL — ABNORMAL HIGH (ref 0–149)
VLDL Cholesterol Cal: 26 mg/dL (ref 5–40)

## 2020-02-01 LAB — TSH: TSH: 2.15 u[IU]/mL (ref 0.450–4.500)

## 2020-02-01 NOTE — Progress Notes (Signed)
Hello Jermari,  Your lab result is normal and/or stable.Some minor variations that are not significant are commonly marked abnormal, but do not represent any medical problem for you.  Best regards, Timon Geissinger, M.D.

## 2020-02-06 NOTE — Chronic Care Management (AMB) (Signed)
Chronic Care Management   Initial Visit Note  12/09/2018  Name: Dennis Spencer MRN: [id number] DOB: 25-Jul-1945  Referred by: Claretta Fraise, MD Reason for referral : Chronic Care Management (RNCM Initial Visit)   Dennis Spencer is a 74 y.o. year old male who is a primary care patient of Stacks, Cletus Gash, MD. The CCM team was consulted for assistance with chronic disease management and care coordination needs related to Obesity, HTN, CKD, Arthritis, bilateral chronic knee pain.  Review of patient status, including review of consultants reports, relevant laboratory and other test results, and collaboration with appropriate care team members and the patient's provider was performed as part of comprehensive patient evaluation and provision of chronic care management services.    SDOH (Social Determinants of Health) assessments performed: Yes See Care Plan activities for detailed interventions related to SDOH     Medications: Outpatient Encounter Medications as of 12/09/2018  Medication Sig  . b complex vitamins tablet Take 1 tablet by mouth daily.  . Calcium Citrate-Vitamin D (CITRACAL + D PO) Take 1 tablet by mouth 3 (three) times daily.   . Cholecalciferol (VITAMIN D3) 2000 UNITS capsule Take 2,000 Units by mouth 2 (two) times daily.   . Multiple Vitamin (MULTIVITAMIN WITH MINERALS) TABS tablet Take 1 tablet by mouth daily.  . vitamin C (ASCORBIC ACID) 500 MG tablet Take 500 mg by mouth 2 (two) times daily.  . [DISCONTINUED] amLODipine (NORVASC) 5 MG tablet TAKE 1 TABLET BY MOUTH EVERY DAY IN THE MORNING (Patient taking differently: Take 5 mg by mouth daily. )  . [DISCONTINUED] atorvastatin (LIPITOR) 80 MG tablet TAKE 1 TABLET (80 MG TOTAL) BY MOUTH DAILY AT 6 PM.  . [DISCONTINUED] BRILINTA 60 MG TABS tablet TAKE 1 TABLET (60 MG TOTAL) BY MOUTH 2 (TWO) TIMES DAILY.  . [DISCONTINUED] cephALEXin (KEFLEX) 500 MG capsule Take 1 capsule (500 mg total) by mouth 2 (two) times daily. X 3 days to  prevent infection with stents in place (Patient not taking: Reported on 01/26/2019)  . [DISCONTINUED] gabapentin (NEURONTIN) 600 MG tablet TAKE 1/2 TABLET (300 MG TOTAL) BY MOUTH DAILY. (Patient taking differently: Take 300 mg by mouth daily. )  . [DISCONTINUED] oxyCODONE-acetaminophen (PERCOCET) 5-325 MG tablet Take 1 tablet by mouth every 6 (six) hours as needed for moderate pain or severe pain. Post-operatively (Patient not taking: Reported on 01/26/2019)  . [DISCONTINUED] pantoprazole (PROTONIX) 40 MG tablet Take 1 tablet (40 mg total) by mouth daily. For stomach  . [DISCONTINUED] senna-docusate (SENOKOT-S) 8.6-50 MG tablet Take 1 tablet by mouth 2 (two) times daily. While taking strong pain meds to prevent constipation (Patient not taking: Reported on 01/26/2019)   No facility-administered encounter medications on file as of 12/09/2018.    BP Readings from Last 3 Encounters:  01/31/20 118/60  01/06/20 (!) 153/74  01/04/20 (!) 163/69     RN Care Plan: Goals Addressed              This Visit's Progress     Patient Stated   .  "I would like to improve my knee pain so that I can get around better and not be in as much pain" (pt-stated)        Current Barriers:  . Chronic Disease Management support and education needs related to arthritis  Nurse Case Manager Clinical Goal(s):  Marland Kitchen Over the next 7 days, patient will attend all scheduled medical appointments: 01/19/2019  Interventions:  Marland Kitchen Verified upcoming appointment with Dr Theda Sers at [place] (  [phone]) on 01/19/19 at 2:30 . Verified with patient that he is aware of the appointment information o Patient is looking forward to the visit and is optimistic about the outcome . RNCM will f/u with patient by telephone after his visit to discuss the plan of care  Patient Self Care Activities:  . Performs ADL's independently . Performs IADL's independently  Please see past updates related to this goal by clicking  on the "Past Updates" button in the selected goal        Other   .  Chronic Disease Management Needs        CARE PLAN ENTRY (see longtitudinal plan of care for additional care plan information)  Current Barriers:  . Chronic Disease Management support, education, and care coordination needs related to Obesity, HTN, CKD, Arthritis, bilateral chronic knee pain  Clinical Goal(s) related to Obesity, HTN, CKD, Arthritis, bilateral chronic knee pain:  Over the next 60 days, patient will:  . Work with the care management team to address educational, disease management, and care coordination needs  . Begin or continue self health monitoring activities as directed today Measure and record blood pressure 4 times per week . Call provider office for new or worsened signs and symptoms Blood pressure findings outside established parameters . Call care management team with questions or concerns . Verbalize basic understanding of patient centered plan of care established today  Interventions related to Obesity, HTN, CKD, Arthritis, bilateral chronic knee pain:  . Evaluation of current treatment plans and patient's adherence to plan as established by provider . Assessed patient understanding of disease states . Assessed patient's education and care coordination needs . Provided disease specific education to patient  . Collaborated with appropriate clinical care team members regarding patient needs  Patient Self Care Activities related to Obesity, HTN, CKD, Arthritis, bilateral chronic knee pain:  . Patient is able to perform ADLs and IADLs independently   Initial goal documentation         Plan:   The care management team will reach out to the patient again over the next 60 days.    Chong Sicilian, BSN, RN-BC Embedded Chronic Care Manager [facility] Direct Dial: [phone]

## 2020-02-06 NOTE — Progress Notes (Signed)
This encounter was created in error - please disregard.

## 2020-02-06 NOTE — Chronic Care Management (AMB) (Signed)
Erroneous encounter

## 2020-02-08 NOTE — Telephone Encounter (Signed)
Erroneous encounter

## 2020-03-22 IMAGING — CT CT ANGIO HEAD
1 of 11 series · 6 of 33 positions shown · IV contrast (Isovue)
Comparison: None.

CLINICAL DATA: 72 y/o M; visual changes. Rule out arterial
stricture/occlusion, head and neck.

EXAM:
CT ANGIOGRAPHY HEAD AND NECK
TECHNIQUE: Multidetector CT imaging of the head and neck was performed using
the standard protocol during bolus administration of intravenous
contrast. Multiplanar CT image reconstructions and MIPs were
obtained to evaluate the vascular anatomy. Carotid stenosis
measurements (when applicable) are obtained utilizing NASCET
criteria, using the distal internal carotid diameter as the
denominator.
CONTRAST:  75mL [id number] IOPAMIDOL (LX785I-IR6) INJECTION 76%

[Series 13: ax thin · axial · 0.45mm/px · z∈[-3,+255]mm · 6 of 372 slices shown]
[im 54/372  soft-tissue]
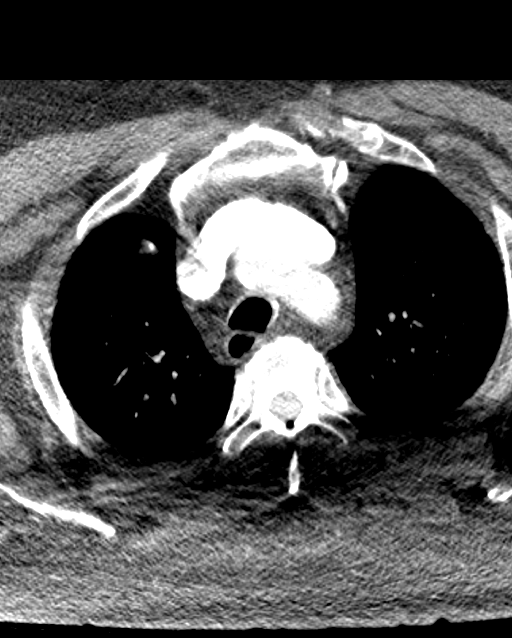
[im 107/372  bone]
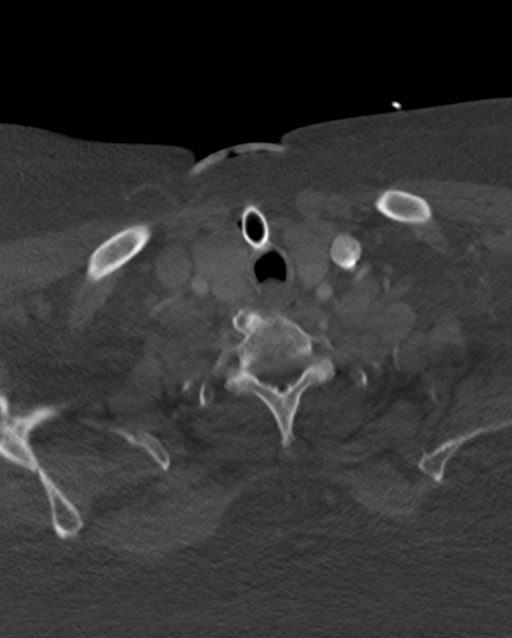
[im 160/372  soft-tissue]
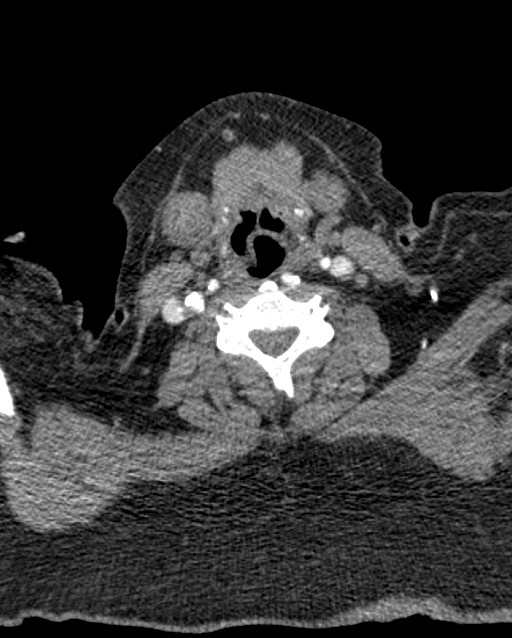
[im 213/372  bone]
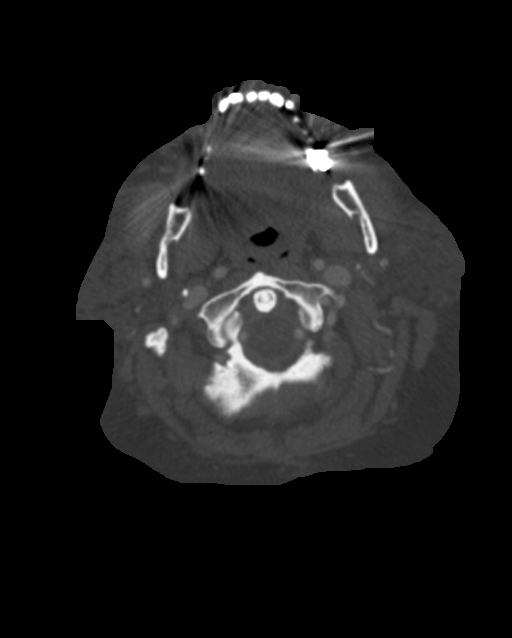
[im 266/372  soft-tissue]
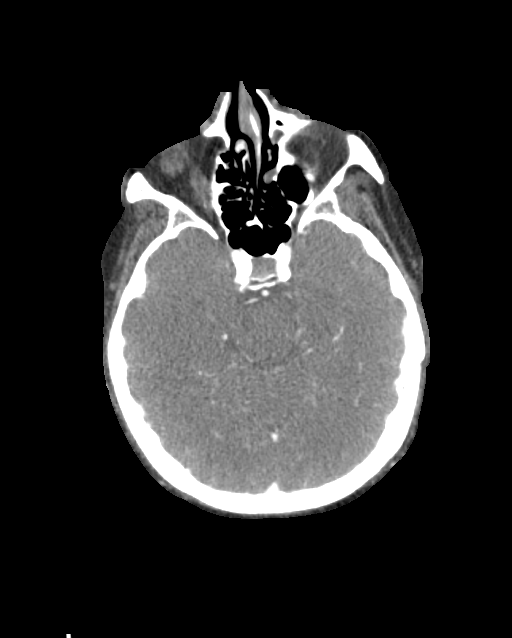
[im 319/372  bone]
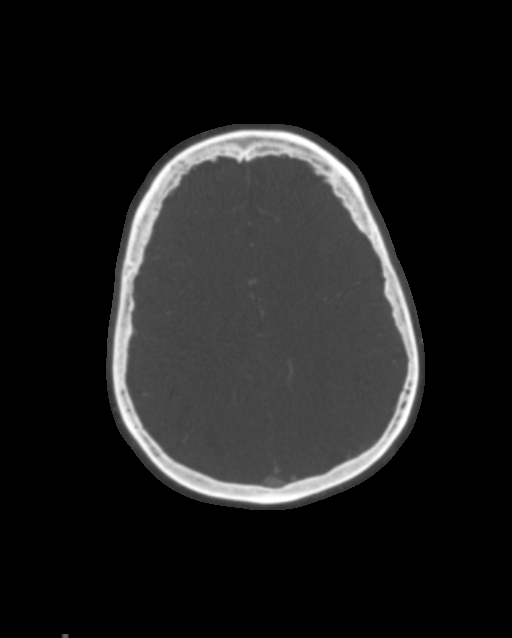

[6 of 33 positions shown; findings below may reference images not displayed]

FINDINGS: CT HEAD FINDINGS

Brain: No evidence of acute infarction, hemorrhage, hydrocephalus,
extra-axial collection or mass lesion/mass effect. Chronic lacunar
infarct within the right caudate head. Nonspecific white matter
hypodensities are compatible with mild chronic microvascular
ischemic changes and there is mild volume loss of the brain for age.

Vascular: As below.

Skull: Normal. Negative for fracture or focal lesion.

Sinuses: Imaged portions are clear.

Orbits: No acute finding.

Review of the MIP images confirms the above findings

CTA NECK FINDINGS

Aortic arch: Standard branching. Imaged portion shows no evidence of
aneurysm or dissection. No significant stenosis of the major arch
vessel origins. Mild calcific atherosclerosis.

Right carotid system: No evidence of dissection, occlusion, or
aneurysm. Calcified plaque of the carotid bifurcation with mild to
moderate 50% proximal ICA stenosis.

Left carotid system: No evidence of dissection, stenosis (50% or
greater) or occlusion. Calcified plaque of the carotid bifurcation
with mild less than 50% proximal ICA stenosis. Retropharyngeal
course of the proximal cervical ICA.

Vertebral arteries: Left dominant. No evidence of dissection,
stenosis (50% or greater) or occlusion.

Skeleton: Mild-to-moderate cervical spondylosis. No high-grade bony
canal stenosis.

Other neck: Negative.

Upper chest: Tracheostomy tube noted extending into the upper
thoracic esophagus.

Review of the MIP images confirms the above findings

CTA HEAD FINDINGS

Anterior circulation: No significant stenosis, proximal occlusion,
aneurysm, or vascular malformation. Extensive calcific
atherosclerosis of carotid siphons with mild bilateral paraclinoid
ICA stenosis.

Posterior circulation: No significant stenosis, proximal occlusion,
aneurysm, or vascular malformation.

Venous sinuses: As permitted by contrast timing, patent.

Anatomic variants: Fetal right PCA.

Delayed phase: No abnormal intracranial enhancement.

Review of the MIP images confirms the above findings
IMPRESSION: CT head:

1. No acute intracranial process or abnormal enhancement.
2. Mild chronic microvascular ischemic changes and volume loss of
the brain. Small chronic infarct in right caudate head.

CTA neck:

1. Mild to moderate 50% proximal right ICA stenosis with calcified
plaque.
2. Mild less 50% proximal left ICA stenosis with calcified plaque.
3. Patent bilateral vertebral arteries. No significant stenosis by
NASCET criteria.
4.  Aortic Atherosclerosis ([id number]).
5. Mild-to-moderate cervical spondylosis.

CTA head:

1. Patent anterior and posterior circulation. No large vessel
occlusion, aneurysm, or significant stenosis is identified.
2. Extensive calcific atherosclerosis of carotid siphons with mild
bilateral paraclinoid ICA stenosis.

## 2020-08-03 ENCOUNTER — Other Ambulatory Visit: Payer: Self-pay | Admitting: Family Medicine

## 2020-08-10 DIAGNOSIS — N200 Calculus of kidney: Secondary | ICD-10-CM | POA: Diagnosis not present

## 2020-08-10 DIAGNOSIS — R82992 Hyperoxaluria: Secondary | ICD-10-CM | POA: Diagnosis not present

## 2020-09-03 ENCOUNTER — Other Ambulatory Visit: Payer: Self-pay | Admitting: Family Medicine

## 2020-09-03 ENCOUNTER — Other Ambulatory Visit: Payer: Self-pay | Admitting: *Deleted

## 2020-09-03 MED ORDER — TICAGRELOR 60 MG PO TABS
60.0000 mg | ORAL_TABLET | Freq: Two times a day (BID) | ORAL | 0 refills | Status: DC
Start: 2020-09-03 — End: 2020-09-12

## 2020-09-03 NOTE — Telephone Encounter (Signed)
Stacks NTBS 30 days given 08/03/20

## 2020-09-12 ENCOUNTER — Ambulatory Visit (INDEPENDENT_AMBULATORY_CARE_PROVIDER_SITE_OTHER): Payer: Medicare HMO | Admitting: Family Medicine

## 2020-09-12 ENCOUNTER — Other Ambulatory Visit: Payer: Self-pay

## 2020-09-12 ENCOUNTER — Encounter: Payer: Self-pay | Admitting: Family Medicine

## 2020-09-12 DIAGNOSIS — M25562 Pain in left knee: Secondary | ICD-10-CM

## 2020-09-12 DIAGNOSIS — I10 Essential (primary) hypertension: Secondary | ICD-10-CM | POA: Diagnosis not present

## 2020-09-12 DIAGNOSIS — G8929 Other chronic pain: Secondary | ICD-10-CM

## 2020-09-12 DIAGNOSIS — E7800 Pure hypercholesterolemia, unspecified: Secondary | ICD-10-CM

## 2020-09-12 DIAGNOSIS — M25561 Pain in right knee: Secondary | ICD-10-CM

## 2020-09-12 LAB — CMP14+EGFR
ALT: 14 IU/L (ref 0–44)
AST: 22 IU/L (ref 0–40)
Albumin/Globulin Ratio: 2 (ref 1.2–2.2)
Albumin: 4.3 g/dL (ref 3.7–4.7)
Alkaline Phosphatase: 85 IU/L (ref 44–121)
BUN/Creatinine Ratio: 13 (ref 10–24)
BUN: 19 mg/dL (ref 8–27)
Bilirubin Total: 0.5 mg/dL (ref 0.0–1.2)
CO2: 22 mmol/L (ref 20–29)
Calcium: 9.1 mg/dL (ref 8.6–10.2)
Chloride: 106 mmol/L (ref 96–106)
Creatinine, Ser: 1.49 mg/dL — ABNORMAL HIGH (ref 0.76–1.27)
Globulin, Total: 2.1 g/dL (ref 1.5–4.5)
Glucose: 109 mg/dL — ABNORMAL HIGH (ref 65–99)
Potassium: 5.6 mmol/L — ABNORMAL HIGH (ref 3.5–5.2)
Sodium: 142 mmol/L (ref 134–144)
Total Protein: 6.4 g/dL (ref 6.0–8.5)
eGFR: 49 mL/min/{1.73_m2} — ABNORMAL LOW (ref >59)

## 2020-09-12 LAB — CBC WITH DIFFERENTIAL/PLATELET
Basophils Absolute: 0.1 10*3/uL (ref 0.0–0.2)
Basos: 1 %
EOS (ABSOLUTE): 0.2 10*3/uL (ref 0.0–0.4)
Eos: 4 %
Hematocrit: 38.1 % (ref 37.5–51.0)
Hemoglobin: 12.5 g/dL — ABNORMAL LOW (ref 13.0–17.7)
Immature Grans (Abs): 0 10*3/uL (ref 0.0–0.1)
Immature Granulocytes: 0 %
Lymphocytes Absolute: 1.6 10*3/uL (ref 0.7–3.1)
Lymphs: 29 %
MCH: 26.9 pg (ref 26.6–33.0)
MCHC: 32.8 g/dL (ref 31.5–35.7)
MCV: 82 fL (ref 79–97)
Monocytes Absolute: 0.8 10*3/uL (ref 0.1–0.9)
Monocytes: 14 %
Neutrophils Absolute: 2.9 10*3/uL (ref 1.4–7.0)
Neutrophils: 52 %
Platelets: 289 10*3/uL (ref 150–450)
RBC: 4.64 x10E6/uL (ref 4.14–5.80)
RDW: 16.5 % — ABNORMAL HIGH (ref 11.6–15.4)
WBC: 5.6 10*3/uL (ref 3.4–10.8)

## 2020-09-12 LAB — LIPID PANEL
Chol/HDL Ratio: 3.3 ratio (ref 0.0–5.0)
Cholesterol, Total: 135 mg/dL (ref 100–199)
HDL: 41 mg/dL (ref >39)
LDL Chol Calc (NIH): 74 mg/dL (ref 0–99)
Triglycerides: 107 mg/dL (ref 0–149)
VLDL Cholesterol Cal: 20 mg/dL (ref 5–40)

## 2020-09-12 MED ORDER — ATORVASTATIN CALCIUM 80 MG PO TABS: 80.0000 mg | ORAL_TABLET | Freq: Every day | ORAL | 3 refills | Status: DC

## 2020-09-12 MED ORDER — GABAPENTIN 600 MG PO TABS: 1200.0000 mg | ORAL_TABLET | Freq: Every day | ORAL | 3 refills | Status: DC

## 2020-09-12 MED ORDER — OZEMPIC (0.25 OR 0.5 MG/DOSE) 2 MG/1.5ML ~~LOC~~ SOPN: 0.5000 mg | PEN_INJECTOR | SUBCUTANEOUS | 2 refills | Status: DC

## 2020-09-12 MED ORDER — AMLODIPINE BESYLATE 5 MG PO TABS: 5.0000 mg | ORAL_TABLET | Freq: Every morning | ORAL | 1 refills | Status: DC

## 2020-09-12 MED ORDER — TICAGRELOR 60 MG PO TABS
60.0000 mg | ORAL_TABLET | Freq: Two times a day (BID) | ORAL | 5 refills | Status: DC
Start: 2020-09-12 — End: 2021-04-19

## 2020-09-12 MED ORDER — PANTOPRAZOLE SODIUM 40 MG PO TBEC
40.0000 mg | DELAYED_RELEASE_TABLET | Freq: Every day | ORAL | 3 refills | Status: DC
Start: 2020-09-12 — End: 2021-09-03

## 2020-09-12 NOTE — Progress Notes (Signed)
Subjective:  Patient ID: Dennis Spencer, male    DOB: Dec 01, 1945  Age: 75 y.o. MRN: [id number]  CC: Medication Refill   HPI Dennis Spencer presents for  in for follow-up of elevated cholesterol. Doing well without complaints on current medication. Denies side effects of statin including myalgia and arthralgia and nausea. Currently no chest pain, shortness of breath or other cardiovascular related symptoms noted.   presents for  follow-up of hypertension. Patient has no history of headache chest pain or shortness of breath or recent cough. Patient also denies symptoms of TIA such as focal numbness or weakness. Patient denies side effects from medication. States taking it regularly.  Considering knee replacement due to chronic pain.  Surgeon cannot do it until he loses a significant amount of weight.  Patient relates that he has been at times as high as 400 and close to 50 during adulthood.  Has had weight reduction surgery.  Depression screen [facility] 2/9 09/12/2020 01/31/2020 01/23/2020  Decreased Interest 0 0 0  Down, Depressed, Hopeless 0 0 0  PHQ - 2 Score 0 0 0  Altered sleeping - - -  Tired, decreased energy - - -  Change in appetite - - -  Feeling bad or failure about yourself  - - -  Trouble concentrating - - -  Moving slowly or fidgety/restless - - -  Suicidal thoughts - - -  PHQ-9 Score - - -  Difficult doing work/chores - - -    History Dennis Spencer has a past medical history of Anticoagulant long-term use, Arthritis, Carotid stenosis, bilateral, Chronic cough, CKD (chronic kidney disease), stage III (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   He has a past surgical history that includes Cystoscopy with retrograde pyelogram, ureteroscopy and stent placement (Bilateral, 06/18/2013); Holmium laser application (Bilateral, 06/18/2013); Tracheostomy (1987); Nephrolithotomy (Left, 08/08/2013); Cystoscopy with retrograde pyelogram, ureteroscopy and stent placement (Right, 08/08/2013); Nephrolithotomy (Left, 08/10/2013); Cystoscopy with retrograde pyelogram, ureteroscopy and stent placement (Right, 08/10/2013); Holmium laser application (Left, 06/15/5571); Cholecystectomy (N/A, 06/15/2015); Colonoscopy (last one 03-28-2016); Cystoscopy with retrograde pyelogram, ureteroscopy and stent placement (Bilateral, 05/22/2017); Holmium laser application (Bilateral, 05/22/2017); Cystoscopy with retrograde pyelogram, ureteroscopy and stent placement (Bilateral, 06/05/2017); Cystoscopy/ureteroscopy/holmium laser/stent placement (Left, 06/26/2017); Cystoscopy w/ ureteral stent removal (Right, 06/26/2017); Tonsillectomy (child); 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 ); Percutaneous nephrostolithotomy (Left, 09-19-2009   dr Diona Fanti  _1 ); Ureteroscopy with holmium laser lithotripsy (Left, 04-14-2002   dr Diona Fanti  _2 ); Orchiopexy (child); Cystoscopy with retrograde pyelogram, ureteroscopy and stent placement (Bilateral, 08/13/2018); Holmium laser application (Bilateral, 08/13/2018); Wisdom tooth extraction; Cystoscopy with retrograde pyelogram, ureteroscopy and stent placement (Bilateral, 10/22/2018); Holmium laser application (Bilateral, 10/22/2018); Cystoscopy w/ ureteral stent removal (Bilateral, 10/22/2018); Cystoscopy with retrograde pyelogram, ureteroscopy and stent placement (Bilateral, 12/23/2019); Holmium laser application (Bilateral, 12/23/2019); Cystoscopy with litholapaxy (N/A, 12/23/2019); and Cystoscopy with retrograde pyelogram, ureteroscopy and stent placement (Bilateral, 01/06/2020).   His family history includes Breast cancer in his  maternal grandmother; Cancer in his mother; Colon cancer in his maternal grandfather; Diabetes in his father; Heart failure in his father; Hypertension in his father.He reports that he quit smoking about 52 years ago. His smoking use included cigarettes. He has a 8.00  pack-year smoking history. He has never used smokeless tobacco. He reports previous alcohol use. He reports that he does not use drugs.    ROS Review of Systems  Constitutional: Negative for fever.  Respiratory: Negative for shortness of breath.   Cardiovascular: Negative for chest pain.  Musculoskeletal: Positive for arthralgias.  Skin: Negative for rash.  Neurological: Negative for headaches.    Objective:  BP 129/60   Pulse 81   Temp 98.1 F (36.7 C)   Ht 5' 10" (1.778 m)   Wt (!) 343 lb (155.6 kg)   SpO2 98%   BMI 49.22 kg/m   BP Readings from Last 3 Encounters:  09/12/20 129/60  01/31/20 118/60  01/06/20 (!) 153/74    Wt Readings from Last 3 Encounters:  09/12/20 (!) 343 lb (155.6 kg)  01/31/20 (!) 335 lb 3.2 oz (152 kg)  01/23/20 (!) 338 lb 9.6 oz (153.6 kg)     Physical Exam Vitals reviewed.  Constitutional:      Appearance: He is well-developed.  HENT:     Head: Normocephalic and atraumatic.     Right Ear: Tympanic membrane and external ear normal. No decreased hearing noted.     Left Ear: Tympanic membrane and external ear normal. No decreased hearing noted.     Mouth/Throat:     Pharynx: No oropharyngeal exudate or posterior oropharyngeal erythema.  Eyes:     Pupils: Pupils are equal, round, and reactive to light.  Cardiovascular:     Rate and Rhythm: Normal rate and regular rhythm.     Heart sounds: No murmur heard.   Pulmonary:     Effort: No respiratory distress.     Breath sounds: Normal breath sounds.  Abdominal:     General: Bowel sounds are normal.     Palpations: Abdomen is soft. There is no mass.     Tenderness: There is no abdominal tenderness.  Musculoskeletal:      Cervical back: Normal range of motion and neck supple.       Assessment & Plan:   Dennis Spencer was seen today for medication refill.  Diagnoses and all orders for this visit:  Severe obesity (BMI >= 40) (HCC) -     Amb Ref to Medical Weight Management -     Semaglutide,0.25 or 0.5MG/DOS, (OZEMPIC, 0.25 OR 0.5 MG/DOSE,) 2 MG/1.5ML SOPN; Inject 0.5 mg into the skin once a week.  Essential hypertension -     amLODipine (NORVASC) 5 MG tablet; Take 1 tablet (5 mg total) by mouth in the morning. -     CMP14+EGFR -     CBC with Differential/Platelet -     Lipid panel  Hypercholesteremia -     atorvastatin (LIPITOR) 80 MG tablet; Take 1 tablet (80 mg total) by mouth daily. -     CMP14+EGFR -     CBC with Differential/Platelet -     Lipid panel  Primary hypertension  Bilateral chronic knee pain -     gabapentin (NEURONTIN) 600 MG tablet; Take 2 tablets (1,200 mg total) by mouth daily. -     Amb Ref to Medical Weight Management -     CMP14+EGFR -     CBC with Differential/Platelet  Other orders -     pantoprazole (PROTONIX) 40 MG tablet; Take 1 tablet (40 mg total) by mouth daily. For stomach -     ticagrelor (BRILINTA) 60 MG TABS tablet; Take 1 tablet (60 mg total) by mouth 2 (two) times daily. (NEEDS TO BE  SEEN BEFORE NEXT REFILL)       I am having Dennis Spencer "Richardson Landry" start on Ozempic (0.25 or 0.5 MG/DOSE). I am also having him maintain his vitamin C, Vitamin D3, multivitamin with minerals, b complex vitamins, Calcium Citrate-Vitamin D (CITRACAL + D PO), amLODipine, atorvastatin, gabapentin, pantoprazole, and ticagrelor.  Allergies as of 09/12/2020      Reactions   Lisinopril Other (See Comments)   DIZZINESS      Medication List       Accurate as of Sep 12, 2020 11:59 PM. If you have any questions, ask your nurse or doctor.        amLODipine 5 MG tablet Commonly known as: NORVASC Take 1 tablet (5 mg total) by mouth in the morning.   atorvastatin 80 MG  tablet Commonly known as: LIPITOR Take 1 tablet (80 mg total) by mouth daily.   b complex vitamins tablet Take 1 tablet by mouth daily.   CITRACAL + D PO Take 1 tablet by mouth 3 (three) times daily.   gabapentin 600 MG tablet Commonly known as: NEURONTIN Take 2 tablets (1,200 mg total) by mouth daily.   multivitamin with minerals Tabs tablet Take 1 tablet by mouth daily.   Ozempic (0.25 or 0.5 MG/DOSE) 2 MG/1.5ML Sopn Generic drug: Semaglutide(0.25 or 0.5MG/DOS) Inject 0.5 mg into the skin once a week. Started by: Claretta Fraise, MD   pantoprazole 40 MG tablet Commonly known as: PROTONIX Take 1 tablet (40 mg total) by mouth daily. For stomach   ticagrelor 60 MG Tabs tablet Commonly known as: Brilinta Take 1 tablet (60 mg total) by mouth 2 (two) times daily. (NEEDS TO BE SEEN BEFORE NEXT REFILL)   vitamin C 500 MG tablet Commonly known as: ASCORBIC ACID Take 500 mg by mouth 2 (two) times daily.   Vitamin D3 50 MCG (2000 UT) capsule Take 2,000 Units by mouth 2 (two) times daily.        Follow-up: Return in about 6 weeks (around 10/24/2020) for weight management.  Claretta Fraise, M.D.

## 2020-09-17 ENCOUNTER — Other Ambulatory Visit: Payer: Self-pay | Admitting: *Deleted

## 2020-09-17 DIAGNOSIS — E875 Hyperkalemia: Secondary | ICD-10-CM

## 2020-09-21 ENCOUNTER — Ambulatory Visit: Payer: Medicare HMO | Admitting: Pharmacist

## 2020-10-05 ENCOUNTER — Ambulatory Visit: Payer: Medicare HMO | Admitting: Pharmacist

## 2020-10-25 ENCOUNTER — Encounter: Payer: Medicare HMO | Attending: Family Medicine | Admitting: Nutrition

## 2020-10-25 ENCOUNTER — Ambulatory Visit (INDEPENDENT_AMBULATORY_CARE_PROVIDER_SITE_OTHER): Payer: Medicare HMO

## 2020-10-25 ENCOUNTER — Other Ambulatory Visit: Payer: Self-pay

## 2020-10-25 ENCOUNTER — Encounter: Payer: Self-pay | Admitting: Nutrition

## 2020-10-25 ENCOUNTER — Ambulatory Visit: Payer: Medicare HMO | Admitting: Dietician

## 2020-10-25 VITALS — Ht 70.0 in | Wt 343.0 lb

## 2020-10-25 DIAGNOSIS — Z0000 Encounter for general adult medical examination without abnormal findings: Secondary | ICD-10-CM | POA: Diagnosis not present

## 2020-10-25 DIAGNOSIS — Z1211 Encounter for screening for malignant neoplasm of colon: Secondary | ICD-10-CM

## 2020-10-25 DIAGNOSIS — N183 Chronic kidney disease, stage 3 unspecified: Secondary | ICD-10-CM | POA: Insufficient documentation

## 2020-10-25 NOTE — Patient Instructions (Signed)
Dennis Spencer , Thank you for taking time to come for your Medicare Wellness Visit. I appreciate your ongoing commitment to your health goals. Please review the following plan we discussed and let me know if I can assist you in the future.   Screening recommendations/referrals: Colonoscopy: Done 03/28/2016 - Repeat 03/2021 (ordered today) Recommended yearly ophthalmology/optometry visit for glaucoma screening and checkup Recommended yearly dental visit for hygiene and checkup  Vaccinations: Influenza vaccine: Done 01/31/2020 - Repeat annually Pneumococcal vaccine: Done 01/08/2016 & 08/12/2017 Tdap vaccine: Done 01/31/2020 - Repeat in 10 years Shingles vaccine: Due (2 doses 2-6 months apart) Shingrix discussed. Please contact your pharmacy for coverage information.    Covid-19: Done 06/28/19, 07/26/19, & 03/14/20  Advanced directives: Advance directive discussed with you today. I have provided a copy for you to complete at home and have notarized. Once this is complete please bring a copy in to our office so we can scan it into your chart.  Conditions/risks identified: Continue trying to increase physical activity to at least 30 minutes per day - seated exercises can also be beneficial; Increase fresh fruits and vegetables, less snacking on junk food.  Next appointment: Follow up in one year for your annual wellness visit.   Preventive Care 5 Years and Older, Male  Preventive care refers to lifestyle choices and visits with your health care provider that can promote health and wellness. What does preventive care include? A yearly physical exam. This is also called an annual well check. Dental exams once or twice a year. Routine eye exams. Ask your health care provider how often you should have your eyes checked. Personal lifestyle choices, including: Daily care of your teeth and gums. Regular physical activity. Eating a healthy diet. Avoiding tobacco and drug use. Limiting alcohol  use. Practicing safe sex. Taking low doses of aspirin every day. Taking vitamin and mineral supplements as recommended by your health care provider. What happens during an annual well check? The services and screenings done by your health care provider during your annual well check will depend on your age, overall health, lifestyle risk factors, and family history of disease. Counseling  Your health care provider may ask you questions about your: Alcohol use. Tobacco use. Drug use. Emotional well-being. Home and relationship well-being. Sexual activity. Eating habits. History of falls. Memory and ability to understand (cognition). Work and work Statistician. Screening  You may have the following tests or measurements: Height, weight, and BMI. Blood pressure. Lipid and cholesterol levels. These may be checked every 5 years, or more frequently if you are over 16 years old. Skin check. Lung cancer screening. You may have this screening every year starting at age 70 if you have a 30-pack-year history of smoking and currently smoke or have quit within the past 15 years. Fecal occult blood test (FOBT) of the stool. You may have this test every year starting at age 49. Flexible sigmoidoscopy or colonoscopy. You may have a sigmoidoscopy every 5 years or a colonoscopy every 10 years starting at age 71. Prostate cancer screening. Recommendations will vary depending on your family history and other risks. Hepatitis C blood test. Hepatitis B blood test. Sexually transmitted disease (STD) testing. Diabetes screening. This is done by checking your blood sugar (glucose) after you have not eaten for a while (fasting). You may have this done every 1-3 years. Abdominal aortic aneurysm (AAA) screening. You may need this if you are a current or former smoker. Osteoporosis. You may be screened starting at age  70 if you are at high risk. Talk with your health care provider about your test results,  treatment options, and if necessary, the need for more tests. Vaccines  Your health care provider may recommend certain vaccines, such as: Influenza vaccine. This is recommended every year. Tetanus, diphtheria, and acellular pertussis (Tdap, Td) vaccine. You may need a Td booster every 10 years. Zoster vaccine. You may need this after age 67. Pneumococcal 13-valent conjugate (PCV13) vaccine. One dose is recommended after age 31. Pneumococcal polysaccharide (PPSV23) vaccine. One dose is recommended after age 12. Talk to your health care provider about which screenings and vaccines you need and how often you need them. This information is not intended to replace advice given to you by your health care provider. Make sure you discuss any questions you have with your health care provider. Document Released: 05/25/2015 Document Revised: 01/16/2016 Document Reviewed: 02/27/2015 Elsevier Interactive Patient Education  2017 [place] Prevention in the Home Falls can cause injuries. They can happen to people of all ages. There are many things you can do to make your home safe and to help prevent falls. What can I do on the outside of my home? Regularly fix the edges of walkways and driveways and fix any cracks. Remove anything that might make you trip as you walk through a door, such as a raised step or threshold. Trim any bushes or trees on the path to your home. Use bright outdoor lighting. Clear any walking paths of anything that might make someone trip, such as rocks or tools. Regularly check to see if handrails are loose or broken. Make sure that both sides of any steps have handrails. Any raised decks and porches should have guardrails on the edges. Have any leaves, snow, or ice cleared regularly. Use sand or salt on walking paths during winter. Clean up any spills in your garage right away. This includes oil or grease spills. What can I do in the bathroom? Use night  lights. Install grab bars by the toilet and in the tub and shower. Do not use towel bars as grab bars. Use non-skid mats or decals in the tub or shower. If you need to sit down in the shower, use a plastic, non-slip stool. Keep the floor dry. Clean up any water that spills on the floor as soon as it happens. Remove soap buildup in the tub or shower regularly. Attach bath mats securely with double-sided non-slip rug tape. Do not have throw rugs and other things on the floor that can make you trip. What can I do in the bedroom? Use night lights. Make sure that you have a light by your bed that is easy to reach. Do not use any sheets or blankets that are too big for your bed. They should not hang down onto the floor. Have a firm chair that has side arms. You can use this for support while you get dressed. Do not have throw rugs and other things on the floor that can make you trip. What can I do in the kitchen? Clean up any spills right away. Avoid walking on wet floors. Keep items that you use a lot in easy-to-reach places. If you need to reach something above you, use a strong step stool that has a grab bar. Keep electrical cords out of the way. Do not use floor polish or wax that makes floors slippery. If you must use wax, use non-skid floor wax. Do not have throw rugs and other  things on the floor that can make you trip. What can I do with my stairs? Do not leave any items on the stairs. Make sure that there are handrails on both sides of the stairs and use them. Fix handrails that are broken or loose. Make sure that handrails are as long as the stairways. Check any carpeting to make sure that it is firmly attached to the stairs. Fix any carpet that is loose or worn. Avoid having throw rugs at the top or bottom of the stairs. If you do have throw rugs, attach them to the floor with carpet tape. Make sure that you have a light switch at the top of the stairs and the bottom of the stairs. If  you do not have them, ask someone to add them for you. What else can I do to help prevent falls? Wear shoes that: Do not have high heels. Have rubber bottoms. Are comfortable and fit you well. Are closed at the toe. Do not wear sandals. If you use a stepladder: Make sure that it is fully opened. Do not climb a closed stepladder. Make sure that both sides of the stepladder are locked into place. Ask someone to hold it for you, if possible. Clearly mark and make sure that you can see: Any grab bars or handrails. First and last steps. Where the edge of each step is. Use tools that help you move around (mobility aids) if they are needed. These include: Canes. Walkers. Scooters. Crutches. Turn on the lights when you go into a dark area. Replace any light bulbs as soon as they burn out. Set up your furniture so you have a clear path. Avoid moving your furniture around. If any of your floors are uneven, fix them. If there are any pets around you, be aware of where they are. Review your medicines with your doctor. Some medicines can make you feel dizzy. This can increase your chance of falling. Ask your doctor what other things that you can do to help prevent falls. This information is not intended to replace advice given to you by your health care provider. Make sure you discuss any questions you have with your health care provider. Document Released: 02/22/2009 Document Revised: 10/04/2015 Document Reviewed: 06/02/2014 Elsevier Interactive Patient Education  2017 [employer].

## 2020-10-25 NOTE — Progress Notes (Signed)
Medical Nutrition Therapy  Appointment Start time:  3710  Appointment End time:  1700  Primary concerns today: Morbid Obesity and CKD Referral diagnosis: E66.01, N18.3 Preferred learning style: all Changes : motivated  [place] is biggest issues is stress and emotional eating. Caring for grand kids and great grand kids. Limited mobility. Admits he needs to work on better meal planning and cooking and preparing healthier foods at home.  Wants to work on improving kidney function.  Anthropometrics  Wt Readings from Last 3 Encounters:  10/25/20 (!) 342 lb 9.6 oz (155.4 kg)  10/25/20 (!) 343 lb (155.6 kg)  09/12/20 (!) 343 lb (155.6 kg)   Ht Readings from Last 3 Encounters:  10/25/20 5\' 10"  (1.778 m)  10/25/20 5\' 10"  (1.778 m)  09/12/20 5\' 10"  (1.778 m)   Body mass index is 49.16 kg/m. @BMIFA @ Facility age limit for growth percentiles is 20 years. Facility age limit for growth percentiles is 20 years.    Clinical Medical Hx: Gastric bypass 2004. Highest weight 420 lbs Lowest 270 lbs, Kidney stones, HTN, Hyperlipidemia,  CKD,  Medications: see chart Labs:  CMP Latest Ref Rng & Units 09/12/2020 01/31/2020 01/04/2020  Glucose 65 - 99 mg/dL 109(H) 113(H) 102(H)  BUN 8 - 27 mg/dL 19 17 23   Creatinine 0.76 - 1.27 mg/dL 1.49(H) 1.59(H) 1.67(H)  Sodium 134 - 144 mmol/L 142 141 144  Potassium 3.5 - 5.2 mmol/L 5.6(H) 5.3(H) 5.2(H)  Chloride 96 - 106 mmol/L 106 104 109  CO2 20 - 29 mmol/L 22 23 24   Calcium 8.6 - 10.2 mg/dL 9.1 9.3 9.2  Total Protein 6.0 - 8.5 g/dL 6.4 6.8 -  Total Bilirubin 0.0 - 1.2 mg/dL 0.5 0.6 -  Alkaline Phos 44 - 121 IU/L 85 105 -  AST 0 - 40 IU/L 22 23 -  ALT 0 - 44 IU/L 14 16 -   Lipid Panel     Component Value Date/Time   CHOL 135 09/12/2020 1059   TRIG 107 09/12/2020 1059   TRIG 133 06/15/2014 1218   HDL 41 09/12/2020 1059   HDL 48 06/15/2014 1218   CHOLHDL 3.3 09/12/2020 1059   CHOLHDL 3.0 11/11/2013 0345   VLDL 15 11/11/2013  0345   LDLCALC 74 09/12/2020 1059   LDLCALC 92 09/12/2013 0953   LDLDIRECT 82 11/09/2012 1718   LABVLDL 20 09/12/2020 1059    Notable Signs/Symptoms: fatigue  Lifestyle & Dietary Hx Married and has granddaughter, grandson and great granddaughter,  He does most of the cooking.  His wife does most of the shopping. Eats mostly at home. Eats out 1-2 times per week.  Estimated daily fluid intake: 80  oz Supplements: MVI, B complex Vit C, Vit D 3, Calcium Sleep: 6 hours  Stress / self-care: family, finances Current average weekly physical activity: ADL  24-Hr Dietary Recall First Meal:  8 am Coffee -cream, cake donut Snack: cheese toast,  coffee Second Meal: skipped: pack of nabs, water Snack: chips Third Meal: Ham, green beans, baked potato, water Snack: popcorn Beverages: water  Estimated Energy Needs Calories: 1500 Carbohydrate: 170g Protein: 112g Fat: 42g   NUTRITION DIAGNOSIS  NI-1.5 Excessive energy intake As related to excessive calories.  As evidenced by BMI 49.   NUTRITION INTERVENTION  Nutrition education (E-1) on the following topics:  Nutrition and  Pre: Diabetes education provided on My Plate, CHO counting, meal planning, portion sizes, timing of meals, avoiding snacks between meals  taking medications as prescribed, benefits of exercising  30 minutes per day and prevention of and complications of DM. Low Sodium Diet, Low Potassium foods.  Handouts Provided Include  The Plate Method  Weight loss tips Potassium handout  Learning Style & Readiness for Change Teaching method utilized: Visual & Auditory  Demonstrated degree of understanding via: Teach Back  Barriers to learning/adherence to lifestyle change: Limited mobility uses a cane  Goals Established by Pt Goals  Eat meals on time Follow Plate Method Cut out junk food Increase fresh fruits, vegetables Drink 150 ounces of water Lose 1-2 lbs per week Keep food journal   MONITORING &  EVALUATION Dietary intake, weekly physical activity, and weight in 1 month.  Next Steps  Patient is to work on meal plannig.

## 2020-10-25 NOTE — Patient Instructions (Addendum)
Goals  Eat meals on time Follow Plate Method Cut out junk food Increase fresh fruits, vegetables Drink 150 ounces of water Lose 1-2 lbs per week Keep food journal Work on meal planning

## 2020-10-25 NOTE — Progress Notes (Signed)
Subjective:   Dennis Spencer is a 75 y.o. male who presents for Medicare Annual/Subsequent preventive examination.  Virtual Visit via Telephone Note  I connected with  Dennis Spencer on 10/25/20 at 10:30 AM EDT by telephone and verified that I am speaking with the correct person using two identifiers.  Location: Patient: Home Provider: [facility] Persons participating in the virtual visit: patient/Nurse Health Advisor   I discussed the limitations, risks, security and privacy concerns of performing an evaluation and management service by telephone and the availability of in person appointments. The patient expressed understanding and agreed to proceed.  Interactive audio and video telecommunications were attempted between this nurse and patient, however failed, due to patient having technical difficulties OR patient did not have access to video capability.  We continued and completed visit with audio only.  Some vital signs may be absent or patient reported.   Dennis Swalley E Marasia Newhall, LPN   Review of Systems     Cardiac Risk Factors include: advanced age (>87men, >21 women);male gender;obesity (BMI >30kg/m2);sedentary lifestyle;dyslipidemia;hypertension     Objective:    Today's Vitals   10/25/20 1039  Weight: (!) 343 lb (155.6 kg)  Height: 5\' 10"  (1.778 m)  PainSc: 7    Body mass index is 49.22 kg/m.  Advanced Directives 10/25/2020 01/23/2020 01/04/2020 12/16/2019 10/25/2019 01/26/2019 10/22/2018  Does Patient Have a Medical Advance Directive? No No No No No No No  Would patient like information on creating a medical advance directive? Yes (MAU/Ambulatory/Procedural Areas - Information given) No - Patient declined - No - Patient declined No - Patient declined - No - Patient declined  Pre-existing out of facility DNR order (yellow form or pink MOST form) - - - - - - -    Current Medications (verified) Outpatient Encounter Medications as of 10/25/2020  Medication Sig   amLODipine  (NORVASC) 5 MG tablet Take 1 tablet (5 mg total) by mouth in the morning.   atorvastatin (LIPITOR) 80 MG tablet Take 1 tablet (80 mg total) by mouth daily.   b complex vitamins tablet Take 1 tablet by mouth daily.   Calcium Citrate-Vitamin D (CITRACAL + D PO) Take 1 tablet by mouth 3 (three) times daily.    Cholecalciferol (VITAMIN D3) 2000 UNITS capsule Take 2,000 Units by mouth 2 (two) times daily.    gabapentin (NEURONTIN) 600 MG tablet Take 2 tablets (1,200 mg total) by mouth daily.   Multiple Vitamin (MULTIVITAMIN WITH MINERALS) TABS tablet Take 1 tablet by mouth daily.   pantoprazole (PROTONIX) 40 MG tablet Take 1 tablet (40 mg total) by mouth daily. For stomach   Semaglutide,0.25 or 0.5MG /DOS, (OZEMPIC, 0.25 OR 0.5 MG/DOSE,) 2 MG/1.5ML SOPN Inject 0.5 mg into the skin once a week.   ticagrelor (BRILINTA) 60 MG TABS tablet Take 1 tablet (60 mg total) by mouth 2 (two) times daily. (NEEDS TO BE SEEN BEFORE NEXT REFILL)   vitamin C (ASCORBIC ACID) 500 MG tablet Take 500 mg by mouth 2 (two) times daily.   No facility-administered encounter medications on file as of 10/25/2020.    Allergies (verified) Lisinopril   History: Past Medical History:  Diagnosis Date   Anticoagulant long-term use    brilinta   Arthritis    Knees   Carotid stenosis, bilateral    per duplex 04-23-2018  bilateral ICA <50%   Chronic cough    per pt this normal due to tracheostomy   CKD (chronic kidney disease), stage III ([place]) 11/10/2013   Dyspnea  GERD (gastroesophageal reflux disease)    History of CVA (cerebrovascular accident) 04/13/2018   chronic lacunar infarct--- per pt no residuals   History of kidney stones    multiple kidney stones   History of osteomyelitis age 47   LLE   Hyperlipidemia    Hypertension    OSA (obstructive sleep apnea) study in epic 09-25-2005  severe osa   1981 s/p tracheostomy, tongue reduction and septoplasty;  LEAVES TRACH OPEN AT NIGHT FOR SLEEP APNEA   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   uncaps at night due to sleep apnea - caps during the day   Renal calculi    bilateral   Severe obesity (BMI >= 40) ([place])    Stroke ([place]) 2019   Possible TIA in the past   Urgency of urination    Wears glasses    Past Surgical History:  Procedure Laterality Date   CHOLECYSTECTOMY N/A 06/15/2015   Procedure: LAPAROSCOPIC CHOLECYSTECTOMY;  Surgeon: Ralene Ok, MD;  Location: [facility];  Service: General;  Laterality: N/A;   COLONOSCOPY  last one 03-28-2016   CYSTOSCOPY W/ URETERAL STENT REMOVAL Right 06/26/2017   Procedure: CYSTOSCOPY WITH STENT REMOVAL;  Surgeon: Alexis Frock, MD;  Location: [facility];  Service: Urology;  Laterality: Right;   CYSTOSCOPY W/ URETERAL STENT REMOVAL Bilateral 10/22/2018   Procedure: CYSTOSCOPY WITH STENT REMOVAL;  Surgeon: Alexis Frock, MD;  [facility]: [facility];  Service: Urology;  Laterality: Bilateral;   CYSTOSCOPY WITH LITHOLAPAXY N/A 12/23/2019   Procedure: CYSTOSCOPY WITH LITHOLAPAXY;  Surgeon: Alexis Frock, MD;  [facility];  Service: Urology;  Laterality: N/A;   CYSTOSCOPY WITH RETROGRADE PYELOGRAM, URETEROSCOPY AND STENT PLACEMENT Bilateral 06/18/2013   Procedure: CYSTOSCOPY WITH RETROGRADE PYELOGRAM, Left URETEROSCOPY with laser , AND bilateral STENT PLACEMENT;  Surgeon: Alexis Frock, MD;  [facility];  Service: Urology;  Laterality: Bilateral;   CYSTOSCOPY WITH RETROGRADE PYELOGRAM, URETEROSCOPY AND STENT PLACEMENT Right 08/08/2013   Procedure: CYSTOSCOPY WITH RIGHT RETROGRADE PYELOGRAM, URETEROSCOPY AND STENT PLACEMENT, stone extraction;  Surgeon: Alexis Frock, MD;  [facility];  Service: Urology;  Laterality: Right;   CYSTOSCOPY WITH RETROGRADE PYELOGRAM, URETEROSCOPY AND STENT PLACEMENT Right 08/10/2013   Procedure: 2ND STAGE CYSTOSCOPY WITH RETROGRADE PYELOGRAM, URETEROSCOPY AND STENT EXCHANGE;  Surgeon: Alexis Frock, MD;  [facility];  Service: Urology;  Laterality: Right;   CYSTOSCOPY WITH  RETROGRADE PYELOGRAM, URETEROSCOPY AND STENT PLACEMENT Bilateral 05/22/2017   Procedure: CYSTOSCOPY WITH RETROGRADE PYELOGRAM, URETEROSCOPY AND STENT PLACEMENT;  Surgeon: Alexis Frock, MD;  [facility];  Service: Urology;  Laterality: Bilateral;   CYSTOSCOPY WITH RETROGRADE PYELOGRAM, URETEROSCOPY AND STENT PLACEMENT Bilateral 06/05/2017   Procedure: CYSTOSCOPY WITH RETROGRADE PYELOGRAM, URETEROSCOPY AND STENT PLACEMENT;  Surgeon: Alexis Frock, MD;  [facility];  Service: Urology;  Laterality: Bilateral;   CYSTOSCOPY WITH RETROGRADE PYELOGRAM, URETEROSCOPY AND STENT PLACEMENT Bilateral 08/13/2018   Procedure: CYSTOSCOPY WITH RETROGRADE PYELOGRAM, URETEROSCOPY AND STENT PLACEMENT;  Surgeon: Alexis Frock, MD;  [facility];  Service: Urology;  Laterality: Bilateral;  10 MINS   CYSTOSCOPY WITH RETROGRADE PYELOGRAM, URETEROSCOPY AND STENT PLACEMENT Bilateral 10/22/2018   Procedure: CYSTOSCOPY WITH RETROGRADE PYELOGRAM, URETEROSCOPY  STONE BASKETRY AND STENT PLACEMENT;  Surgeon: Alexis Frock, MD;  [facility];  Service: Urology;  Laterality: Bilateral;   CYSTOSCOPY WITH RETROGRADE PYELOGRAM, URETEROSCOPY AND STENT PLACEMENT Bilateral 12/23/2019   Procedure: CYSTOSCOPY WITH RETROGRADE PYELOGRAM, URETEROSCOPY AND STENT PLACEMENT;  Surgeon: Alexis Frock, MD;  [facility];  Service: Urology;  Laterality: Bilateral;  90  MINS   CYSTOSCOPY WITH RETROGRADE PYELOGRAM, URETEROSCOPY AND STENT PLACEMENT Bilateral 01/06/2020   Procedure: CYSTOSCOPY WITH RETROGRADE PYELOGRAM, URETEROSCOPY AND STENT EXCHANGE, RETROGRADE, BASKETING OF STONES;  Surgeon: Alexis Frock, MD;  [facility];  Service: Urology;  Laterality: Bilateral;  90 MINS   CYSTOSCOPY/URETEROSCOPY/HOLMIUM LASER/STENT PLACEMENT Left 06/26/2017   Procedure: CYSTOSCOPY/URETEROSCOPY third stage/HOLMIUM LASER/STENT PLACEMENT retrograde pylegram;  Surgeon: Alexis Frock, MD;  [facility];  Service: Urology;  Laterality:  Left;   GASTRIC ROUX-EN-Y  2004    @[facility]    HOLMIUM LASER APPLICATION Bilateral 09/14/2561   Procedure: HOLMIUM LASER APPLICATION;  Surgeon: Alexis Frock, MD;  [facility];  Service: Urology;  Laterality: Bilateral;   HOLMIUM LASER APPLICATION Left 12/18/3732   Procedure: HOLMIUM LASER APPLICATION;  Surgeon: Alexis Frock, MD;  [facility];  Service: Urology;  Laterality: Left;   HOLMIUM LASER APPLICATION Bilateral 2/87/6811   Procedure: HOLMIUM LASER APPLICATION;  Surgeon: Alexis Frock, MD;  [facility];  Service: Urology;  Laterality: Bilateral;   HOLMIUM LASER APPLICATION Bilateral 09/16/2618   Procedure: HOLMIUM LASER APPLICATION;  Surgeon: Alexis Frock, MD;  [facility];  Service: Urology;  Laterality: Bilateral;   HOLMIUM LASER APPLICATION Bilateral 3/55/9741   Procedure: HOLMIUM LASER APPLICATION;  Surgeon: Alexis Frock, MD;  [facility];  Service: Urology;  Laterality: Bilateral;   HOLMIUM LASER APPLICATION Bilateral 6/38/4536   Procedure: HOLMIUM LASER APPLICATION;  Surgeon: Alexis Frock, MD;  [facility];  Service: Urology;  Laterality: Bilateral;   NEPHROLITHOTOMY Left 08/08/2013   Procedure: 1ST STAGE NEPHROLITHOTOMY PERCUTANEOUS WITH SURGEON ACCESS;  Surgeon: Alexis Frock, MD;  [facility];  Service: Urology;  Laterality: Left;   NEPHROLITHOTOMY Left 08/10/2013   Procedure: NEPHROLITHOTOMY PERCUTANEOUS SECOND LOOK/LEFT DIGITAL URETEROSCOPY/BASKETING OF STONE/EXCHANGE OF LEFT URETERAL STENT;  Surgeon: Alexis Frock, MD;  [facility];  Service: Urology;  Laterality: Left;   ORCHIOPEXY  child   unlateral undescended testis   PERCUTANEOUS NEPHROSTOLITHOTOMY Left 09-19-2009   dr Diona Fanti  @[facility]    TONSILLECTOMY  child   TRACHEOSTOMY  1987   s/p tongue reduction and nasal septum deviation repair for  Sleep apnea    URETEROSCOPY WITH HOLMIUM LASER LITHOTRIPSY Left 04-14-2002   dr Diona Fanti  @[facility]    WISDOM TOOTH EXTRACTION     Family History   Problem Relation Age of Onset   Cancer Mother        Ovarian, died age 12.   Diabetes Father    Hypertension Father    Heart failure Father        Died of MI age 34.   Breast cancer Maternal Grandmother    Colon cancer Maternal Grandfather    Esophageal cancer Neg Hx    Stomach cancer Neg Hx    Rectal cancer Neg Hx    Social History   Socioeconomic History   Marital status: Married    Spouse name: Helene Kelp   Number of children: 3   Years of education: Not on file   Highest education level: Master's degree (e.g., MA, MS, MEng, MEd, MSW, MBA)  Occupational History   Occupation: Retired, English as a second language teacher.    Employer: [employer]  Tobacco Use   Smoking status: Former    Packs/day: 1.00    Years: 8.00    Pack years: 8.00    Types: Cigarettes    Quit date: 05/12/1968    Years since quitting: 52.4   Smokeless tobacco: Never  Vaping Use   Vaping Use: Never used  Substance and Sexual  Activity   Alcohol use: Not Currently   Drug use: Never   Sexual activity: Yes    Birth control/protection: None  Other Topics Concern   Not on file  Social History Narrative   Married, wife Helene Kelp.  1 daughter, 2 sons. Daughter and grandchildren currently reside with them. 1 dog and 1 cat as pets. Enjoys reading and photography.    Social Determinants of Health   Financial Resource Strain: Low Risk    Difficulty of Paying Living Expenses: Not hard at all  Food Insecurity: No Food Insecurity   Worried About Charity fundraiser in the Last Year: Never true   [place] in the Last Year: Never true  Transportation Needs: No Transportation Needs   Lack of Transportation (Medical): No   Lack of Transportation (Non-Medical): No  Physical Activity: Insufficiently Active   Days of Exercise per Week: 7 days   Minutes of Exercise per Session: 10 min  Stress: No Stress Concern Present   Feeling of Stress : Only a little  Social Connections: Engineer, building services of Communication with  Friends and Family: More than three times a week   Frequency of Social Gatherings with Friends and Family: Once a week   Attends Religious Services: More than 4 times per year   Active Member of [employer] or Organizations: Yes   Attends Music therapist: More than 4 times per year   Marital Status: Married    Tobacco Counseling Counseling given: Not Answered   Clinical Intake:  Pre-visit preparation completed: Yes  Pain : 0-10 Pain Score: 7  Pain Type: Chronic pain Pain Location: Knee Pain Orientation: Right, Left Pain Descriptors / Indicators: Aching, Sharp, Sore, Discomfort, Grimacing Pain Onset: More than a month ago Pain Frequency: Intermittent     BMI - recorded: 49.22 Nutritional Status: BMI > 30  Obese Nutritional Risks: None Diabetes: No  How often do you need to have someone help you when you read instructions, pamphlets, or other written materials from your doctor or pharmacy?: 1 - Never  Diabetic? no  Interpreter Needed?: No  Information entered by :: Laurren Lepkowski, LPN   Activities of Daily Living In your present state of health, do you have any difficulty performing the following activities: 10/25/2020 01/04/2020  Hearing? N N  Vision? N N  Difficulty concentrating or making decisions? N N  Walking or climbing stairs? N Y  Comment - -  Dressing or bathing? N N  Doing errands, shopping? N N  Preparing Food and eating ? N -  Using the Toilet? N -  In the past six months, have you accidently leaked urine? N -  Do you have problems with loss of bowel control? N -  Managing your Medications? N -  Managing your Finances? N -  Housekeeping or managing your Housekeeping? N -  Some recent data might be hidden    Patient Care Team: Claretta Fraise, MD as PCP - General (Family Medicine) Alexis Frock, MD as Consulting Physician (Urology) Sydnee Cabal, MD as Consulting Physician (Orthopedic Surgery) Celestia Khat, [place] (Optometry)  Indicate  any recent Medical Services you may have received from other than [facility] providers in the past year (date may be approximate).     Assessment:   This is a routine wellness examination for Jaelin.  Hearing/Vision screen Hearing Screening - Comments:: Denies hearing difficulties  Vision Screening - Comments:: Wears eyeglasses - up to date with annual eye exams with [facility] in  [place]  Dietary issues and exercise activities discussed: Current Exercise Habits: Home exercise routine, Type of exercise: walking, Time (Minutes): 10, Frequency (Times/Week): 7, Weekly Exercise (Minutes/Week): 70, Intensity: Mild, Exercise limited by: orthopedic condition(s);neurologic condition(s)   Goals Addressed               This Visit's Progress     "I would like to increase my physical activity level" (pt-stated)   On track     Current Barriers:  Bilateral knee pain Obesity Covid 19 social distancing restrictions  Nurse Case Manager Clinical Goal(s):  Over the next 30 days, patient will work with [facility] to identify ways to increase physical activity level with consideration for chronic knee pain  Interventions:  RNCM will review ortho surgeons notes and recommendations after patient's visit on 01/19/2019 RNCM will collaborate with patient's care team regarding plan of care for knee pain and how to increase activity level appropriately   Patient Self Care Activities:  Performs ADL's independently Performs IADL's independently  Initial goal documentation        DIET - EAT MORE FRUITS AND VEGETABLES   On track     Patient Stated   On track     10/25/2019 AWV Goal: Keep All Scheduled Appointments  Over the next year, patient will attend all scheduled appointments with their PCP and any specialists that they see.         Depression Screen PHQ 2/9 Scores 10/25/2020 09/12/2020 01/31/2020 01/23/2020 10/25/2019 05/02/2019 09/27/2018  PHQ - 2 Score 2 0 0 0 0 0 2  PHQ- 9 Score 3 - - - - - 3    Fall  Risk Fall Risk  10/25/2020 09/12/2020 01/31/2020 01/23/2020 10/25/2019  Falls in the past year? 0 0 0 0 0  Number falls in past yr: 0 - 0 - -  Injury with Fall? 0 - 0 - -  Risk for fall due to : Impaired balance/gait;Orthopedic patient;Impaired vision;Medication side effect - No Fall Risks - -  Follow up Education provided;Falls prevention discussed - Falls evaluation completed - -    FALL RISK PREVENTION PERTAINING TO THE HOME:  Any stairs in or around the home? Yes  If so, are there any without handrails? No  Home free of loose throw rugs in walkways, pet beds, electrical cords, etc? Yes  Adequate lighting in your home to reduce risk of falls? Yes   ASSISTIVE DEVICES UTILIZED TO PREVENT FALLS:  Life alert? No  Use of a cane, walker or w/c? Yes  Grab bars in the bathroom? Yes  Shower chair or bench in shower? No  Elevated toilet seat or a handicapped toilet? No   TIMED UP AND GO:  Was the test performed? No . Telephonic visit  Cognitive Function:     6CIT Screen 10/25/2020 10/25/2019 09/27/2018  What Year? 0 points 0 points 0 points  What month? 0 points 0 points 0 points  What time? 0 points 0 points 0 points  Count back from 20 0 points 0 points 0 points  Months in reverse 0 points 0 points 0 points  Repeat phrase 0 points 0 points 0 points  Total Score 0 0 0    Immunizations Immunization History  Administered Date(s) Administered   Fluad Quad(high Dose 65+) 05/02/2019, 01/31/2020   Influenza,inj,Quad PF,6+ Mos 06/20/2013, 02/11/2017, 02/09/2018   Influenza-Unspecified 07/26/2015   Moderna SARS-COV2 Booster Vaccination 03/14/2020   Moderna Sars-Covid-2 Vaccination 06/28/2019, 07/26/2019   Pneumococcal Conjugate-13 01/08/2016   Pneumococcal Polysaccharide-23 08/12/2017  Tdap 01/31/2020    TDAP status: Up to date  Flu Vaccine status: Up to date  Pneumococcal vaccine status: Up to date  Covid-19 vaccine status: Completed vaccines  Qualifies for Shingles Vaccine?  Yes   Zostavax completed No   Shingrix Completed?: No.    Education has been provided regarding the importance of this vaccine. Patient has been advised to call insurance company to determine out of pocket expense if they have not yet received this vaccine. Advised may also receive vaccine at local pharmacy or Health Dept. Verbalized acceptance and understanding.  Screening Tests Health Maintenance  Topic Date Due   Zoster Vaccines- Shingrix (1 of 2) Never done   COVID-19 Vaccine (4 - Booster for Moderna series) 07/12/2020   INFLUENZA VACCINE  12/10/2020   COLONOSCOPY (Pts 45-31yrs Insurance coverage will need to be confirmed)  03/28/2021   TETANUS/TDAP  01/30/2030   Hepatitis C Screening  Completed   PNA vac Low Risk Adult  Completed   HPV VACCINES  Aged Out    Health Maintenance  Health Maintenance Due  Topic Date Due   Zoster Vaccines- Shingrix (1 of 2) Never done   COVID-19 Vaccine (4 - Booster for Moderna series) 07/12/2020    Colorectal cancer screening: Referral to GI placed 10/25/20. Pt aware the office will call re: appt.  Lung Cancer Screening: (Low Dose CT Chest recommended if Age 63-80 years, 30 pack-year currently smoking OR have quit w/in 15years.) does not qualify.  Additional Screening:  Hepatitis C Screening: does qualify; Completed 01/31/2020  Vision Screening: Recommended annual ophthalmology exams for early detection of glaucoma and other disorders of the eye. Is the patient up to date with their annual eye exam?  Yes  Who is the provider or what is the name of the office in which the patient attends annual eye exams? [place] If pt is not established with a provider, would they like to be referred to a provider to establish care? No .   Dental Screening: Recommended annual dental exams for proper oral hygiene  Community Resource Referral / Chronic Care Management: CRR required this visit?  No   CCM required this visit?  No      Plan:     I  have personally reviewed and noted the following in the patient's chart:   Medical and social history Use of alcohol, tobacco or illicit drugs  Current medications and supplements including opioid prescriptions. Patient is not currently taking opioid prescriptions. Functional ability and status Nutritional status Physical activity Advanced directives List of other physicians Hospitalizations, surgeries, and ER visits in previous 12 months Vitals Screenings to include cognitive, depression, and falls Referrals and appointments  In addition, I have reviewed and discussed with patient certain preventive protocols, quality metrics, and best practice recommendations. A written personalized care plan for preventive services as well as general preventive health recommendations were provided to patient.     Sandrea Hammond, LPN   8/46/6599   Nurse Notes: None

## 2020-10-30 ENCOUNTER — Encounter: Payer: Self-pay | Admitting: Family Medicine

## 2020-10-30 ENCOUNTER — Ambulatory Visit (INDEPENDENT_AMBULATORY_CARE_PROVIDER_SITE_OTHER): Payer: Medicare HMO | Admitting: Family Medicine

## 2020-10-30 ENCOUNTER — Other Ambulatory Visit: Payer: Self-pay

## 2020-10-30 VITALS — BP 150/71 | HR 83 | Temp 97.7°F | Ht 70.0 in | Wt 340.0 lb

## 2020-10-30 DIAGNOSIS — I10 Essential (primary) hypertension: Secondary | ICD-10-CM

## 2020-10-30 DIAGNOSIS — N183 Chronic kidney disease, stage 3 unspecified: Secondary | ICD-10-CM | POA: Diagnosis not present

## 2020-10-30 LAB — BMP8+EGFR
BUN/Creatinine Ratio: 10 (ref 10–24)
BUN: 16 mg/dL (ref 8–27)
CO2: 23 mmol/L (ref 20–29)
Calcium: 9.1 mg/dL (ref 8.6–10.2)
Chloride: 105 mmol/L (ref 96–106)
Creatinine, Ser: 1.54 mg/dL — ABNORMAL HIGH (ref 0.76–1.27)
Glucose: 112 mg/dL — ABNORMAL HIGH (ref 65–99)
Potassium: 4.8 mmol/L (ref 3.5–5.2)
Sodium: 142 mmol/L (ref 134–144)
eGFR: 47 mL/min/{1.73_m2} — ABNORMAL LOW (ref >59)

## 2020-10-30 NOTE — Progress Notes (Signed)
Subjective:  Patient ID: Dennis Spencer, male    DOB: Jan 06, 1946  Age: 75 y.o. MRN: [id number]  CC: Follow-up   HPI Dennis Spencer presents for  follow-up of hypertension. Patient has no history of headache chest pain or shortness of breath or recent cough. Patient also denies symptoms of TIA such as focal numbness or weakness. Patient denies side effects from medication. States taking it regularly.  Patient is trying to lose weight as result of his arthritis and the need for joint replacement.  He has been told that he cannot have the surgery due to his weight.  Patient has been working on it this month without the [place].  This is because the Ozempic cost was too much.   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   He has a past surgical history that includes Cystoscopy with retrograde pyelogram, ureteroscopy and stent placement (Bilateral, 06/18/2013); Holmium laser application (Bilateral, 06/18/2013); Tracheostomy (1987); Nephrolithotomy (Left, 08/08/2013); Cystoscopy with retrograde pyelogram, ureteroscopy and stent placement (Right, 08/08/2013); Nephrolithotomy (Left, 08/10/2013); Cystoscopy with retrograde pyelogram, ureteroscopy and stent placement (Right, 08/10/2013); Holmium laser application (Left, 11/17/381); Cholecystectomy (N/A, 06/15/2015); Colonoscopy (last one 03-28-2016); Cystoscopy with retrograde pyelogram, ureteroscopy and stent  placement (Bilateral, 05/22/2017); Holmium laser application (Bilateral, 05/22/2017); Cystoscopy with retrograde pyelogram, ureteroscopy and stent placement (Bilateral, 06/05/2017); Cystoscopy/ureteroscopy/holmium laser/stent placement (Left, 06/26/2017); Cystoscopy w/ ureteral stent removal (Right, 06/26/2017); Tonsillectomy (child); Gastric Roux-En-Y (2004    @[facility] ); Percutaneous nephrostolithotomy (Left, 09-19-2009   dr Diona Fanti  @[facility] ); Ureteroscopy with holmium laser lithotripsy (Left, 04-14-2002   dr Diona Fanti  @[facility] ); Orchiopexy (child); Cystoscopy with retrograde pyelogram, ureteroscopy and stent placement (Bilateral, 08/13/2018); Holmium laser application (Bilateral, 08/13/2018); Wisdom tooth extraction; Cystoscopy with retrograde pyelogram, ureteroscopy and stent placement (Bilateral, 10/22/2018); Holmium laser application (Bilateral, 10/22/2018); Cystoscopy w/ ureteral stent removal (Bilateral, 10/22/2018); Cystoscopy with retrograde pyelogram, ureteroscopy and stent placement (Bilateral, 12/23/2019); Holmium laser application (Bilateral, 12/23/2019); Cystoscopy with litholapaxy (N/A, 12/23/2019); and Cystoscopy with retrograde pyelogram, ureteroscopy and stent placement (Bilateral, 01/06/2020).   His family history includes Breast cancer in his maternal grandmother; Cancer in his mother; Colon cancer in his maternal grandfather; Diabetes in his father; Heart failure in his father; Hypertension in his father.He reports that he quit smoking about 52 years ago. His smoking use included cigarettes. He has a 8.00 pack-year smoking history. He has never used smokeless tobacco. He reports previous alcohol use. He reports that he does not use drugs.  Current Outpatient Medications on File Prior to Visit  Medication Sig Dispense Refill   amLODipine (NORVASC) 5 MG tablet Take 1 tablet (5 mg total) by mouth in the morning. 90 tablet 1   atorvastatin (LIPITOR) 80 MG tablet Take 1 tablet (80 mg total) by mouth daily. 90 tablet 3    b complex vitamins tablet Take 1 tablet by mouth daily.     Calcium Citrate-Vitamin D (CITRACAL + D PO) Take 1 tablet by mouth 3 (three) times daily.      Cholecalciferol (VITAMIN D3) 2000 UNITS capsule Take 2,000 Units by mouth 2 (two) times daily.  gabapentin (NEURONTIN) 600 MG tablet Take 2 tablets (1,200 mg total) by mouth daily. 180 tablet 3   Multiple Vitamin (MULTIVITAMIN WITH MINERALS) TABS tablet Take 1 tablet by mouth daily.     pantoprazole (PROTONIX) 40 MG tablet Take 1 tablet (40 mg total) by mouth daily. For stomach 90 tablet 3   ticagrelor (BRILINTA) 60 MG TABS tablet Take 1 tablet (60 mg total) by mouth 2 (two) times daily. (NEEDS TO BE SEEN BEFORE NEXT REFILL) 60 tablet 5   vitamin C (ASCORBIC ACID) 500 MG tablet Take 500 mg by mouth 2 (two) times daily.     Semaglutide,0.25 or 0.5MG/DOS, (OZEMPIC, 0.25 OR 0.5 MG/DOSE,) 2 MG/1.5ML SOPN Inject 0.5 mg into the skin once a week. 1 each 2   No current facility-administered medications on file prior to visit.    ROS Review of Systems  Constitutional: Negative.   HENT: Negative.    Eyes:  Negative for visual disturbance.  Respiratory:  Negative for cough and shortness of breath.   Cardiovascular:  Negative for chest pain and leg swelling.  Gastrointestinal:  Negative for abdominal pain, diarrhea, nausea and vomiting.  Genitourinary:  Negative for difficulty urinating.  Musculoskeletal:  Positive for arthralgias. Negative for myalgias.  Skin:  Negative for rash.  Neurological:  Negative for headaches.  Psychiatric/Behavioral:  Negative for sleep disturbance.    Objective:  BP (!) 150/71   Pulse 83   Temp 97.7 F (36.5 C)   Ht 5' 10"  (1.778 m)   Wt (!) 340 lb (154.2 kg)   SpO2 96%   BMI 48.78 kg/m   BP Readings from Last 3 Encounters:  10/30/20 (!) 150/71  09/12/20 129/60  01/31/20 118/60    Wt Readings from Last 3 Encounters:  10/30/20 (!) 340 lb (154.2 kg)  10/25/20 (!) 342 lb 9.6 oz (155.4 kg)   10/25/20 (!) 343 lb (155.6 kg)     Physical Exam Vitals reviewed.  Constitutional:      Appearance: He is well-developed.  HENT:     Head: Normocephalic and atraumatic.     Right Ear: External ear normal.     Left Ear: External ear normal.     Mouth/Throat:     Pharynx: No oropharyngeal exudate or posterior oropharyngeal erythema.  Eyes:     Pupils: Pupils are equal, round, and reactive to light.  Cardiovascular:     Rate and Rhythm: Normal rate and regular rhythm.     Heart sounds: No murmur heard. Pulmonary:     Effort: No respiratory distress.     Breath sounds: Normal breath sounds.  Musculoskeletal:     Cervical back: Normal range of motion and neck supple.  Neurological:     Mental Status: He is alert and oriented to person, place, and time.      Assessment & Plan:   Dennis Spencer was seen today for follow-up.  Diagnoses and all orders for this visit:  Primary hypertension -     BMP8+EGFR  Stage 3 chronic kidney disease, unspecified whether stage 3a or 3b CKD ([place]) -     BMP8+EGFR  Morbid obesity ([place]) -     BMP8+EGFR  Allergies as of 10/30/2020       Reactions   Lisinopril Other (See Comments)   DIZZINESS        Medication List        Accurate as of October 30, 2020  9:05 PM. If you have any questions, ask your nurse or doctor.  amLODipine 5 MG tablet Commonly known as: NORVASC Take 1 tablet (5 mg total) by mouth in the morning.   atorvastatin 80 MG tablet Commonly known as: LIPITOR Take 1 tablet (80 mg total) by mouth daily.   b complex vitamins tablet Take 1 tablet by mouth daily.   CITRACAL + D PO Take 1 tablet by mouth 3 (three) times daily.   gabapentin 600 MG tablet Commonly known as: NEURONTIN Take 2 tablets (1,200 mg total) by mouth daily.   multivitamin with minerals Tabs tablet Take 1 tablet by mouth daily.   Ozempic (0.25 or 0.5 MG/DOSE) 2 MG/1.5ML Sopn Generic drug: Semaglutide(0.25 or 0.5MG/DOS) Inject 0.5 mg  into the skin once a week.   pantoprazole 40 MG tablet Commonly known as: PROTONIX Take 1 tablet (40 mg total) by mouth daily. For stomach   ticagrelor 60 MG Tabs tablet Commonly known as: Brilinta Take 1 tablet (60 mg total) by mouth 2 (two) times daily. (NEEDS TO BE SEEN BEFORE NEXT REFILL)   vitamin C 500 MG tablet Commonly known as: ASCORBIC ACID Take 500 mg by mouth 2 (two) times daily.   Vitamin D3 50 MCG (2000 UT) capsule Take 2,000 Units by mouth 2 (two) times daily.        No orders of the defined types were placed in this encounter.      Follow-up: Return in about 2 months (around 12/30/2020).  Claretta Fraise, M.D.

## 2020-10-31 NOTE — Progress Notes (Signed)
Hello Nicolaos,  Your lab result is normal and/or stable.Some minor variations that are not significant are commonly marked abnormal, but do not represent any medical problem for you.  Best regards, Luna Audia, M.D.

## 2020-11-06 ENCOUNTER — Other Ambulatory Visit: Payer: Self-pay

## 2020-11-06 ENCOUNTER — Ambulatory Visit (INDEPENDENT_AMBULATORY_CARE_PROVIDER_SITE_OTHER): Payer: Medicare HMO | Admitting: Pharmacist

## 2020-11-06 NOTE — Progress Notes (Signed)
    11/06/2020 Name: Dennis Spencer MRN: [id number] DOB: 1945/12/11  S:  61 yoM Presents for weight loss evaluation, education, and management.  Patient was referred and last seen by Primary Care Provider on 10/30/2020.  He is interested in starting Ozempic for weight loss.  He had gastric bypass surgery years ago (2004).  He has continued to keep off 100lbs+.  He has also seen nutrition on 10/25/20 to aid with his diet/lifestyle modifications.   Insurance coverage/medication affordability: [employer] medicare    Patient reports adherence with medications. Current medications for weight loss: n/a  Patient is retired and admits he needs to increase activity.  Ozempic is only recommend along with diet and exercise.  Encouraged patient.  Discussed meal planning options and Plate method for healthy eating Avoid sugary drinks and desserts Incorporate balanced protein, non starchy veggies, 1 serving of carbohydrate with each meal Increase water intake Increase physical activity as able     A/P:   -Healthy eating and meal planning discussed (continue to follow with nutrition)   -Increase water and exercise   -Will start Ozempic 0.25mg  sq weekly for 4 weeks, then increase to 0.5mg  sq weekly for 4 weeks (will titrate as needed).  Patient denies history of thyroid/medullary cancer.             Work to eat low fat smaller meals to reduce side effects             Decreased carbonated beverages  Application submitted to [employer] for patient assistance since patient on medicare   Written patient instructions provided.  Total time in face to face counseling 25 minutes.   Regina Eck, PharmD, BCPS Clinical Pharmacist, [place]  [facility] Phone [phone]

## 2020-12-03 ENCOUNTER — Ambulatory Visit: Payer: Medicare HMO | Admitting: Nutrition

## 2020-12-05 ENCOUNTER — Telehealth: Payer: Self-pay | Admitting: *Deleted

## 2020-12-05 NOTE — Telephone Encounter (Signed)
Pt assistance - #5 boxes of ozempic here  For pick up - pt aware in fridge with name on it

## 2020-12-20 ENCOUNTER — Other Ambulatory Visit: Payer: Self-pay

## 2020-12-20 ENCOUNTER — Encounter: Payer: Self-pay | Admitting: Family Medicine

## 2020-12-20 ENCOUNTER — Ambulatory Visit (INDEPENDENT_AMBULATORY_CARE_PROVIDER_SITE_OTHER): Payer: Medicare HMO | Admitting: Family Medicine

## 2020-12-20 VITALS — BP 136/74 | HR 98 | Temp 98.4°F | Ht 70.0 in | Wt 340.2 lb

## 2020-12-20 DIAGNOSIS — M79674 Pain in right toe(s): Secondary | ICD-10-CM | POA: Diagnosis not present

## 2020-12-20 DIAGNOSIS — M109 Gout, unspecified: Secondary | ICD-10-CM

## 2020-12-20 MED ORDER — PREDNISONE 20 MG PO TABS: 40.0000 mg | ORAL_TABLET | Freq: Every day | ORAL | 0 refills | Status: AC

## 2020-12-20 NOTE — Progress Notes (Signed)
Subjective:  Patient ID: Dennis Spencer, male    DOB: 1946/01/05, 75 y.o.   MRN: [id number]  Patient Care Team: Claretta Fraise, MD as PCP - General (Family Medicine) Alexis Frock, MD as Consulting Physician (Urology) Sydnee Cabal, MD as Consulting Physician (Orthopedic Surgery) Celestia Khat, [place] (Optometry)   Chief Complaint:  Foot Pain (Right foot- red and swollen )   HPI: Dennis Spencer is a 75 y.o. male presenting on 12/20/2020 for Foot Pain (Right foot- red and swollen )   Onset of right great toe pain, swelling, and redness last night. States hurts to touch or move toe. No known injury. Family history of gout. Personal history of renal stones.   Foot Pain This is a new problem. The current episode started yesterday. The problem occurs constantly. The problem has been gradually worsening. Associated symptoms include arthralgias and joint swelling. Pertinent negatives include no abdominal pain, anorexia, change in bowel habit, chest pain, chills, congestion, coughing, diaphoresis, fatigue, fever, headaches, myalgias, nausea, neck pain, numbness, rash, sore throat, swollen glands, urinary symptoms, vertigo, visual change, vomiting or weakness. The symptoms are aggravated by bending, walking and standing (touch). He has tried NSAIDs for the symptoms. The treatment provided moderate relief.   Relevant past medical, surgical, family, and social history reviewed and updated as indicated.  Allergies and medications reviewed and updated. Date reviewed: Chart in Epic.   Past Medical History:  Diagnosis Date   Anticoagulant long-term use    brilinta   Arthritis    Knees   Carotid stenosis, bilateral    per duplex 04-23-2018  bilateral ICA <50%   Chronic cough    per pt this normal due to tracheostomy   CKD (chronic kidney disease), stage III ([place]) 11/10/2013   Dyspnea    GERD (gastroesophageal reflux disease)    History of CVA (cerebrovascular accident) 04/13/2018    chronic lacunar infarct--- per pt no residuals   History of kidney stones    multiple kidney stones   History of osteomyelitis age 50   LLE   Hyperlipidemia    Hypertension    OSA (obstructive sleep apnea) study in epic 09-25-2005  severe osa   1981 s/p tracheostomy, tongue reduction and septoplasty;  LEAVES TRACH OPEN AT NIGHT FOR SLEEP APNEA   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   uncaps at night due to sleep apnea - caps during the day   Renal calculi    bilateral   Severe obesity (BMI >= 40) ([place])    Stroke ([place]) 2019   Possible TIA in the past   Urgency of urination    Wears glasses     Past Surgical History:  Procedure Laterality Date   CHOLECYSTECTOMY N/A 06/15/2015   Procedure: LAPAROSCOPIC CHOLECYSTECTOMY;  Surgeon: Ralene Ok, MD;  Location: [facility];  Service: General;  Laterality: N/A;   COLONOSCOPY  last one 03-28-2016   CYSTOSCOPY W/ URETERAL STENT REMOVAL Right 06/26/2017   Procedure: CYSTOSCOPY WITH STENT REMOVAL;  Surgeon: Alexis Frock, MD;  Location: [facility];  Service: Urology;  Laterality: Right;   CYSTOSCOPY W/ URETERAL STENT REMOVAL Bilateral 10/22/2018   Procedure: CYSTOSCOPY WITH STENT REMOVAL;  Surgeon: Alexis Frock, MD;  Location: [facility];  Service: Urology;  Laterality: Bilateral;   CYSTOSCOPY WITH LITHOLAPAXY N/A 12/23/2019   Procedure: CYSTOSCOPY WITH LITHOLAPAXY;  Surgeon: Alexis Frock, MD;  Location: [facility];  Service: Urology;  Laterality: N/A;   CYSTOSCOPY WITH RETROGRADE PYELOGRAM, URETEROSCOPY AND STENT PLACEMENT Bilateral 06/18/2013  Procedure: CYSTOSCOPY WITH RETROGRADE PYELOGRAM, Left URETEROSCOPY with laser , AND bilateral STENT PLACEMENT;  Surgeon: Alexis Frock, MD;  Location: [facility];  Service: Urology;  Laterality: Bilateral;   CYSTOSCOPY WITH RETROGRADE PYELOGRAM, URETEROSCOPY AND STENT PLACEMENT Right 08/08/2013   Procedure: CYSTOSCOPY WITH RIGHT RETROGRADE PYELOGRAM, URETEROSCOPY AND STENT PLACEMENT, stone extraction;  Surgeon:  Alexis Frock, MD;  Location: [facility];  Service: Urology;  Laterality: Right;   CYSTOSCOPY WITH RETROGRADE PYELOGRAM, URETEROSCOPY AND STENT PLACEMENT Right 08/10/2013   Procedure: 2ND STAGE CYSTOSCOPY WITH RETROGRADE PYELOGRAM, URETEROSCOPY AND STENT EXCHANGE;  Surgeon: Alexis Frock, MD;  Location: [facility];  Service: Urology;  Laterality: Right;   CYSTOSCOPY WITH RETROGRADE PYELOGRAM, URETEROSCOPY AND STENT PLACEMENT Bilateral 05/22/2017   Procedure: CYSTOSCOPY WITH RETROGRADE PYELOGRAM, URETEROSCOPY AND STENT PLACEMENT;  Surgeon: Alexis Frock, MD;  Location: [facility];  Service: Urology;  Laterality: Bilateral;   CYSTOSCOPY WITH RETROGRADE PYELOGRAM, URETEROSCOPY AND STENT PLACEMENT Bilateral 06/05/2017   Procedure: CYSTOSCOPY WITH RETROGRADE PYELOGRAM, URETEROSCOPY AND STENT PLACEMENT;  Surgeon: Alexis Frock, MD;  [facility]: [facility];  Service: Urology;  Laterality: Bilateral;   CYSTOSCOPY WITH RETROGRADE PYELOGRAM, URETEROSCOPY AND STENT PLACEMENT Bilateral 08/13/2018   Procedure: CYSTOSCOPY WITH RETROGRADE PYELOGRAM, URETEROSCOPY AND STENT PLACEMENT;  Surgeon: Alexis Frock, MD;  Location: [facility];  Service: Urology;  Laterality: Bilateral;  48 MINS   CYSTOSCOPY WITH RETROGRADE PYELOGRAM, URETEROSCOPY AND STENT PLACEMENT Bilateral 10/22/2018   Procedure: CYSTOSCOPY WITH RETROGRADE PYELOGRAM, URETEROSCOPY  STONE BASKETRY AND STENT PLACEMENT;  Surgeon: Alexis Frock, MD;  Location: [facility];  Service: Urology;  Laterality: Bilateral;   CYSTOSCOPY WITH RETROGRADE PYELOGRAM, URETEROSCOPY AND STENT PLACEMENT Bilateral 12/23/2019   Procedure: CYSTOSCOPY WITH RETROGRADE PYELOGRAM, URETEROSCOPY AND STENT PLACEMENT;  Surgeon: Alexis Frock, MD;  Location: [facility];  Service: Urology;  Laterality: Bilateral;  58 MINS   CYSTOSCOPY WITH RETROGRADE PYELOGRAM, URETEROSCOPY AND STENT PLACEMENT Bilateral 01/06/2020   Procedure: CYSTOSCOPY WITH RETROGRADE PYELOGRAM, URETEROSCOPY AND STENT EXCHANGE, RETROGRADE, BASKETING  OF STONES;  Surgeon: Alexis Frock, MD;  Location: [facility];  Service: Urology;  Laterality: Bilateral;  90 MINS   CYSTOSCOPY/URETEROSCOPY/HOLMIUM LASER/STENT PLACEMENT Left 06/26/2017   Procedure: CYSTOSCOPY/URETEROSCOPY third stage/HOLMIUM LASER/STENT PLACEMENT retrograde pylegram;  Surgeon: Alexis Frock, MD;  Location: [facility];  Service: Urology;  Laterality: Left;   GASTRIC ROUX-EN-Y  2004    @[facility]    HOLMIUM LASER APPLICATION Bilateral 12/11/7076   Procedure: HOLMIUM LASER APPLICATION;  Surgeon: Alexis Frock, MD;  Location: [facility];  Service: Urology;  Laterality: Bilateral;   HOLMIUM LASER APPLICATION Left 10/17/5447   Procedure: HOLMIUM LASER APPLICATION;  Surgeon: Alexis Frock, MD;  Location: [facility];  Service: Urology;  Laterality: Left;   HOLMIUM LASER APPLICATION Bilateral 06/12/69   Procedure: HOLMIUM LASER APPLICATION;  Surgeon: Alexis Frock, MD;  [facility]: [facility];  Service: Urology;  Laterality: Bilateral;   HOLMIUM LASER APPLICATION Bilateral 06/12/9756   Procedure: HOLMIUM LASER APPLICATION;  Surgeon: Alexis Frock, MD;  [facility]: [facility];  Service: Urology;  Laterality: Bilateral;   HOLMIUM LASER APPLICATION Bilateral 8/32/5498   Procedure: HOLMIUM LASER APPLICATION;  Surgeon: Alexis Frock, MD;  [facility]: [facility];  Service: Urology;  Laterality: Bilateral;   HOLMIUM LASER APPLICATION Bilateral 2/64/1583   Procedure: HOLMIUM LASER APPLICATION;  Surgeon: Alexis Frock, MD;  Location: [facility];  Service: Urology;  Laterality: Bilateral;   NEPHROLITHOTOMY Left 08/08/2013   Procedure: 1ST STAGE NEPHROLITHOTOMY PERCUTANEOUS WITH SURGEON ACCESS;  Surgeon: Alexis Frock, MD;  Location: [facility];  Service: Urology;  Laterality: Left;   NEPHROLITHOTOMY Left  08/10/2013   Procedure: NEPHROLITHOTOMY PERCUTANEOUS SECOND LOOK/LEFT DIGITAL URETEROSCOPY/BASKETING OF STONE/EXCHANGE OF LEFT URETERAL STENT;  Surgeon: Alexis Frock, MD;  Location: [facility];  Service: Urology;  Laterality: Left;    ORCHIOPEXY  child   unlateral undescended testis   PERCUTANEOUS NEPHROSTOLITHOTOMY Left 09-19-2009   dr Diona Fanti  @[facility]    TONSILLECTOMY  child   TRACHEOSTOMY  1987   s/p tongue reduction and nasal septum deviation repair for  Sleep apnea    URETEROSCOPY WITH HOLMIUM LASER LITHOTRIPSY Left 04-14-2002   dr Diona Fanti  @[facility]    WISDOM TOOTH EXTRACTION      Social History   Socioeconomic History   Marital status: Married    Spouse name: Helene Kelp   Number of children: 3   Years of education: Not on file   Highest education level: Master's degree (e.g., MA, MS, MEng, MEd, MSW, MBA)  Occupational History   Occupation: Retired, English as a second language teacher.    Employer: [employer]  Tobacco Use   Smoking status: Former    Packs/day: 1.00    Years: 8.00    Pack years: 8.00    Types: Cigarettes    Quit date: 05/12/1968    Years since quitting: 52.6   Smokeless tobacco: Never  Vaping Use   Vaping Use: Never used  Substance and Sexual Activity   Alcohol use: Not Currently   Drug use: Never   Sexual activity: Yes    Birth control/protection: None  Other Topics Concern   Not on file  Social History Narrative   Married, wife Helene Kelp.  1 daughter, 2 sons. Daughter and grandchildren currently reside with them. 1 dog and 1 cat as pets. Enjoys reading and photography.    Social Determinants of Health   Financial Resource Strain: Low Risk    Difficulty of Paying Living Expenses: Not hard at all  Food Insecurity: No Food Insecurity   Worried About Charity fundraiser in the Last Year: Never true   [place] in the Last Year: Never true  Transportation Needs: No Transportation Needs   Lack of Transportation (Medical): No   Lack of Transportation (Non-Medical): No  Physical Activity: Insufficiently Active   Days of Exercise per Week: 7 days   Minutes of Exercise per Session: 10 min  Stress: No Stress Concern Present   Feeling of Stress : Only a little  Social Connections: Engineer, building services  of Communication with Friends and Family: More than three times a week   Frequency of Social Gatherings with Friends and Family: Once a week   Attends Religious Services: More than 4 times per year   Active Member of [employer] or Organizations: Yes   Attends Music therapist: More than 4 times per year   Marital Status: Married  Human resources officer Violence: Not At Risk   Fear of Current or Ex-Partner: No   Emotionally Abused: No   Physically Abused: No   Sexually Abused: No    Outpatient Encounter Medications as of 12/20/2020  Medication Sig   amLODipine (NORVASC) 5 MG tablet Take 1 tablet (5 mg total) by mouth in the morning.   atorvastatin (LIPITOR) 80 MG tablet Take 1 tablet (80 mg total) by mouth daily.   b complex vitamins tablet Take 1 tablet by mouth daily.   Calcium Citrate-Vitamin D (CITRACAL + D PO) Take 1 tablet by mouth 3 (three) times daily.    Cholecalciferol (VITAMIN D3) 2000 UNITS capsule Take 2,000 Units by mouth 2 (two) times daily.  gabapentin (NEURONTIN) 600 MG tablet Take 2 tablets (1,200 mg total) by mouth daily.   Multiple Vitamin (MULTIVITAMIN WITH MINERALS) TABS tablet Take 1 tablet by mouth daily.   pantoprazole (PROTONIX) 40 MG tablet Take 1 tablet (40 mg total) by mouth daily. For stomach   predniSONE (DELTASONE) 20 MG tablet Take 2 tablets (40 mg total) by mouth daily with breakfast for 5 days.   ticagrelor (BRILINTA) 60 MG TABS tablet Take 1 tablet (60 mg total) by mouth 2 (two) times daily. (NEEDS TO BE SEEN BEFORE NEXT REFILL)   vitamin C (ASCORBIC ACID) 500 MG tablet Take 500 mg by mouth 2 (two) times daily.   Semaglutide,0.25 or 0.5MG/DOS, (OZEMPIC, 0.25 OR 0.5 MG/DOSE,) 2 MG/1.5ML SOPN Inject 0.5 mg into the skin once a week. (Patient not taking: Reported on 12/20/2020)   No facility-administered encounter medications on file as of 12/20/2020.    Allergies  Allergen Reactions   Lisinopril Other (See Comments)    DIZZINESS    Review of  Systems  Constitutional:  Negative for activity change, appetite change, chills, diaphoresis, fatigue, fever and unexpected weight change.  HENT: Negative.  Negative for congestion and sore throat.   Eyes: Negative.   Respiratory:  Negative for cough, chest tightness and shortness of breath.   Cardiovascular:  Negative for chest pain, palpitations and leg swelling.  Gastrointestinal:  Negative for abdominal pain, anorexia, blood in stool, change in bowel habit, constipation, diarrhea, nausea and vomiting.  Endocrine: Negative.   Genitourinary:  Negative for dysuria, frequency and urgency.  Musculoskeletal:  Positive for arthralgias and joint swelling. Negative for back pain, gait problem, myalgias, neck pain and neck stiffness.  Skin:  Positive for color change. Negative for rash.  Allergic/Immunologic: Negative.   Neurological:  Negative for dizziness, vertigo, weakness, numbness and headaches.  Hematological: Negative.   Psychiatric/Behavioral:  Negative for confusion, hallucinations, sleep disturbance and suicidal ideas.   All other systems reviewed and are negative.      Objective:  BP 136/74   Pulse 98   Temp 98.4 F (36.9 C) (Temporal)   Ht 5' 10"  (1.778 m)   Wt (!) 340 lb 3.2 oz (154.3 kg)   SpO2 96%   BMI 48.81 kg/m    Wt Readings from Last 3 Encounters:  12/20/20 (!) 340 lb 3.2 oz (154.3 kg)  10/30/20 (!) 340 lb (154.2 kg)  10/25/20 (!) 342 lb 9.6 oz (155.4 kg)    Physical Exam Vitals and nursing note reviewed.  Constitutional:      General: He is not in acute distress.    Appearance: Normal appearance. He is well-developed and well-groomed. He is obese. He is not ill-appearing, toxic-appearing or diaphoretic.  HENT:     Head: Normocephalic and atraumatic.     Jaw: There is normal jaw occlusion.     Right Ear: Hearing normal.     Left Ear: Hearing normal.     Nose: Nose normal.     Mouth/Throat:     Lips: Pink.     Mouth: Mucous membranes are moist.      Pharynx: Oropharynx is clear. Uvula midline.  Eyes:     General: Lids are normal.     Extraocular Movements: Extraocular movements intact.     Conjunctiva/sclera: Conjunctivae normal.     Pupils: Pupils are equal, round, and reactive to light.  Neck:     Thyroid: No thyroid mass, thyromegaly or thyroid tenderness.     Vascular: No carotid bruit or JVD.  Trachea: Phonation normal. Tracheostomy present.  Cardiovascular:     Rate and Rhythm: Normal rate and regular rhythm.     Chest Wall: PMI is not displaced.     Pulses: Normal pulses.     Heart sounds: Normal heart sounds. No murmur heard.   No friction rub. No gallop.  Pulmonary:     Effort: Pulmonary effort is normal. No respiratory distress.     Breath sounds: Normal breath sounds. No wheezing.  Abdominal:     General: Bowel sounds are normal. There is no distension or abdominal bruit.     Palpations: Abdomen is soft. There is no hepatomegaly or splenomegaly.     Tenderness: There is no abdominal tenderness. There is no right CVA tenderness or left CVA tenderness.     Hernia: No hernia is present.  Musculoskeletal:        General: Normal range of motion.     Cervical back: Normal range of motion and neck supple.     Right lower leg: Normal. No edema.     Left lower leg: No edema.     Right foot: Normal range of motion and normal capillary refill. Swelling and tenderness present. No deformity, bunion, Charcot foot, foot drop, prominent metatarsal heads, laceration, bony tenderness or crepitus. Normal pulse.     Left foot: Normal.     Comments: Right great toe tenderness, swelling, and erythema  Lymphadenopathy:     Cervical: No cervical adenopathy.  Skin:    General: Skin is warm and dry.     Capillary Refill: Capillary refill takes less than 2 seconds.     Coloration: Skin is not cyanotic, jaundiced or pale.     Findings: No rash.  Neurological:     General: No focal deficit present.     Mental Status: He is alert and  oriented to person, place, and time.     Cranial Nerves: Cranial nerves are intact. No cranial nerve deficit.     Sensory: Sensation is intact. No sensory deficit.     Motor: Motor function is intact. No weakness.     Coordination: Coordination is intact. Coordination normal.     Gait: Gait is intact. Gait normal.     Deep Tendon Reflexes: Reflexes are normal and symmetric. Reflexes normal.  Psychiatric:        Attention and Perception: Attention and perception normal.        Mood and Affect: Mood and affect normal.        Speech: Speech normal.        Behavior: Behavior normal. Behavior is cooperative.        Thought Content: Thought content normal.        Cognition and Memory: Cognition and memory normal.        Judgment: Judgment normal.    Results for orders placed or performed in visit on 10/30/20  BMP8+EGFR  Result Value Ref Range   Glucose 112 (H) 65 - 99 mg/dL   BUN 16 8 - 27 mg/dL   Creatinine, Ser 1.54 (H) 0.76 - 1.27 mg/dL   eGFR 47 (L) >59 mL/min/1.73   BUN/Creatinine Ratio 10 10 - 24   Sodium 142 134 - 144 mmol/L   Potassium 4.8 3.5 - 5.2 mmol/L   Chloride 105 96 - 106 mmol/L   CO2 23 20 - 29 mmol/L   Calcium 9.1 8.6 - 10.2 mg/dL       Pertinent labs & imaging results that were available during my care  of the patient were reviewed by me and considered in my medical decision making.  Assessment & Plan:  Skylar was seen today for foot pain.  Diagnoses and all orders for this visit:  Acute gout involving toe of right foot, unspecified cause Pain of right great toe Pain, swelling, and erythema to right great toe. No injury. Consistent with gout. Will treat accordingly. Will check uric acid level. Pt aware of symptomatic care. If recurrent flares, may require preventative therapy.  -     Uric acid -     predniSONE (DELTASONE) 20 MG tablet; Take 2 tablets (40 mg total) by mouth daily with breakfast for 5 days.  Continue all other maintenance  medications.  Follow up plan: Return if symptoms worsen or fail to improve.   Continue healthy lifestyle choices, including diet (rich in fruits, vegetables, and lean proteins, and low in salt and simple carbohydrates) and exercise (at least 30 minutes of moderate physical activity daily).  Educational handout given for gout  The above assessment and management plan was discussed with the patient. The patient verbalized understanding of and has agreed to the management plan. Patient is aware to call the clinic if they develop any new symptoms or if symptoms persist or worsen. Patient is aware when to return to the clinic for a follow-up visit. Patient educated on when it is appropriate to go to the emergency department.   Monia Pouch, FNP-C [facility] [phone]

## 2020-12-21 LAB — URIC ACID: Uric Acid: 7.3 mg/dL (ref 3.8–8.4)

## 2020-12-26 DIAGNOSIS — Z23 Encounter for immunization: Secondary | ICD-10-CM | POA: Diagnosis not present

## 2020-12-31 ENCOUNTER — Ambulatory Visit: Payer: Medicare HMO | Admitting: Family Medicine

## 2021-02-07 DIAGNOSIS — R82992 Hyperoxaluria: Secondary | ICD-10-CM | POA: Diagnosis not present

## 2021-02-07 DIAGNOSIS — N200 Calculus of kidney: Secondary | ICD-10-CM | POA: Diagnosis not present

## 2021-02-18 ENCOUNTER — Encounter: Payer: Self-pay | Admitting: Gastroenterology

## 2021-03-11 ENCOUNTER — Other Ambulatory Visit: Payer: Self-pay | Admitting: Family Medicine

## 2021-03-11 DIAGNOSIS — I10 Essential (primary) hypertension: Secondary | ICD-10-CM

## 2021-04-04 ENCOUNTER — Other Ambulatory Visit: Payer: Self-pay | Admitting: Family Medicine

## 2021-04-04 DIAGNOSIS — I10 Essential (primary) hypertension: Secondary | ICD-10-CM

## 2021-04-19 ENCOUNTER — Other Ambulatory Visit: Payer: Self-pay | Admitting: Family Medicine

## 2021-04-23 ENCOUNTER — Encounter: Payer: Self-pay | Admitting: Family Medicine

## 2021-04-23 ENCOUNTER — Ambulatory Visit (INDEPENDENT_AMBULATORY_CARE_PROVIDER_SITE_OTHER): Payer: Medicare HMO | Admitting: Family Medicine

## 2021-04-23 VITALS — BP 139/63 | HR 80 | Temp 97.3°F | Ht 70.0 in | Wt 346.0 lb

## 2021-04-23 DIAGNOSIS — Z23 Encounter for immunization: Secondary | ICD-10-CM | POA: Diagnosis not present

## 2021-04-23 DIAGNOSIS — F321 Major depressive disorder, single episode, moderate: Secondary | ICD-10-CM | POA: Diagnosis not present

## 2021-04-23 DIAGNOSIS — E7800 Pure hypercholesterolemia, unspecified: Secondary | ICD-10-CM | POA: Diagnosis not present

## 2021-04-23 DIAGNOSIS — I10 Essential (primary) hypertension: Secondary | ICD-10-CM

## 2021-04-23 DIAGNOSIS — Z125 Encounter for screening for malignant neoplasm of prostate: Secondary | ICD-10-CM | POA: Diagnosis not present

## 2021-04-23 MED ORDER — ESCITALOPRAM OXALATE 10 MG PO TABS: 10.0000 mg | ORAL_TABLET | Freq: Every day | ORAL | 1 refills | Status: DC

## 2021-04-23 MED ORDER — AMLODIPINE BESYLATE 5 MG PO TABS: 5.0000 mg | ORAL_TABLET | Freq: Every morning | ORAL | 3 refills | Status: DC

## 2021-04-23 NOTE — Progress Notes (Signed)
Subjective:  Patient ID: Dennis Spencer, male    DOB: 1946/02/22  Age: 75 y.o. MRN: [id number]  CC: Medical Management of Chronic Issues   HPI Dennis Spencer presents for  presents for  follow-up of hypertension. Patient has no history of headache chest pain or shortness of breath or recent cough. Patient also denies symptoms of TIA such as focal numbness or weakness. Patient denies side effects from medication. States taking it regularly.   in for follow-up of elevated cholesterol. Doing well without complaints on current medication. Denies side effects of statin including myalgia and arthralgia and nausea. Currently no chest pain, shortness of breath or other cardiovascular related symptoms noted.  Wife is caregiver for her 36 year old mother. Also keeping 35 mos old great granddaughter while granddaughter attends school. Yolanda Bonine out of school since Nov. 15 for panic attack.   Feeling extreme stress. Feeling hopeless. Hasn't started the ozempic.   Depression screen [facility] 2/9 04/23/2021 04/23/2021 12/20/2020  Decreased Interest 2 0 1  Down, Depressed, Hopeless 2 0 1  PHQ - 2 Score 4 0 2  Altered sleeping 0 - 0  Tired, decreased energy 3 - 1  Change in appetite 1 - 0  Feeling bad or failure about yourself  2 - 0  Trouble concentrating 1 - 0  Moving slowly or fidgety/restless 1 - 0  Suicidal thoughts 0 - 0  PHQ-9 Score 12 - 3  Difficult doing work/chores Somewhat difficult - Somewhat difficult  Some recent data might be hidden    History Dennis Spencer has a past medical history of Anticoagulant long-term use, Arthritis, Carotid stenosis, bilateral, Chronic cough, CKD (chronic kidney disease), stage III (HCC) (11/10/2013), Dyspnea, GERD (gastroesophageal reflux disease), History of CVA (cerebrovascular accident) (04/13/2018), History of kidney stones, History of osteomyelitis (age 42), Hyperlipidemia, Hypertension, OSA (obstructive sleep apnea) (study in epic 09-25-2005  severe osa),  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   He has a past surgical history that includes Cystoscopy with retrograde pyelogram, ureteroscopy and stent placement (Bilateral, 06/18/2013); Holmium laser application (Bilateral, 06/18/2013); Tracheostomy (1987); Nephrolithotomy (Left, 08/08/2013); Cystoscopy with retrograde pyelogram, ureteroscopy and stent placement (Right, 08/08/2013); Nephrolithotomy (Left, 08/10/2013); Cystoscopy with retrograde pyelogram, ureteroscopy and stent placement (Right, 08/10/2013); Holmium laser application (Left, 06/16/537); Cholecystectomy (N/A, 06/15/2015); Colonoscopy (last one 03-28-2016); Cystoscopy with retrograde pyelogram, ureteroscopy and stent placement (Bilateral, 05/22/2017); Holmium laser application (Bilateral, 05/22/2017); Cystoscopy with retrograde pyelogram, ureteroscopy and stent placement (Bilateral, 06/05/2017); Cystoscopy/ureteroscopy/holmium laser/stent placement (Left, 06/26/2017); Cystoscopy w/ ureteral stent removal (Right, 06/26/2017); Tonsillectomy (child); 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 ); Percutaneous nephrostolithotomy (Left, 09-19-2009   dr Diona Fanti  _1 ); Ureteroscopy with holmium laser lithotripsy (Left, 04-14-2002   dr Diona Fanti  _2 ); Orchiopexy (child); Cystoscopy with retrograde pyelogram, ureteroscopy and stent placement (Bilateral, 08/13/2018); Holmium laser application (Bilateral, 08/13/2018); Wisdom tooth extraction; Cystoscopy with retrograde pyelogram, ureteroscopy and stent placement (Bilateral, 10/22/2018); Holmium laser application (Bilateral, 10/22/2018); Cystoscopy w/ ureteral stent removal (Bilateral, 10/22/2018); Cystoscopy with retrograde pyelogram, ureteroscopy and stent placement (Bilateral, 12/23/2019); Holmium laser application (Bilateral, 12/23/2019); Cystoscopy with litholapaxy (N/A, 12/23/2019); and Cystoscopy with retrograde pyelogram,  ureteroscopy and stent placement (Bilateral, 01/06/2020).   His family history includes Breast cancer in his maternal grandmother; Cancer in his mother; Colon cancer in his maternal grandfather; Diabetes in his father; Heart failure in his father; Hypertension in his father.He reports that he quit smoking about 52 years ago.  His smoking use included cigarettes. He has a 8.00 pack-year smoking history. He has never used smokeless tobacco. He reports that he does not currently use alcohol. He reports that he does not use drugs.    ROS Review of Systems  Constitutional: Negative.   HENT: Negative.    Eyes:  Negative for visual disturbance.  Respiratory:  Negative for cough and shortness of breath.   Cardiovascular:  Negative for chest pain and leg swelling.  Gastrointestinal:  Negative for abdominal pain, diarrhea, nausea and vomiting.  Genitourinary:  Negative for difficulty urinating.  Musculoskeletal:  Negative for arthralgias and myalgias.  Skin:  Negative for rash.  Neurological:  Negative for headaches.  Psychiatric/Behavioral:  Positive for dysphoric mood. Negative for sleep disturbance.    Objective:  BP 139/63    Pulse 80    Temp (!) 97.3 F (36.3 C)    Ht _0  (1.778 m)    Wt (!) 346 lb (156.9 kg)    SpO2 98%    BMI 49.65 kg/m   BP Readings from Last 3 Encounters:  04/23/21 139/63  12/20/20 136/74  10/30/20 (!) 150/71    Wt Readings from Last 3 Encounters:  04/23/21 (!) 346 lb (156.9 kg)  12/20/20 (!) 340 lb 3.2 oz (154.3 kg)  10/30/20 (!) 340 lb (154.2 kg)     Physical Exam Constitutional:      General: He is not in acute distress.    Appearance: He is well-developed.  HENT:     Head: Normocephalic and atraumatic.     Right Ear: External ear normal.     Left Ear: External ear normal.     Nose: Nose normal.  Eyes:     Conjunctiva/sclera: Conjunctivae normal.     Pupils: Pupils are equal, round, and reactive to light.  Cardiovascular:     Rate and Rhythm:  Normal rate and regular rhythm.     Heart sounds: Normal heart sounds. No murmur heard. Pulmonary:     Effort: Pulmonary effort is normal. No respiratory distress.     Breath sounds: Normal breath sounds. No wheezing or rales.  Abdominal:     Palpations: Abdomen is soft.     Tenderness: There is no abdominal tenderness.  Musculoskeletal:        General: Normal range of motion.     Cervical back: Normal range of motion and neck supple.  Skin:    General: Skin is warm and dry.  Neurological:     Mental Status: He is alert and oriented to person, place, and time.     Deep Tendon Reflexes: Reflexes are normal and symmetric.  Psychiatric:        Behavior: Behavior normal.        Thought Content: Thought content normal.        Judgment: Judgment normal.      Assessment & Plan:   Welford was seen today for medical management of chronic issues.  Diagnoses and all orders for this visit:  Primary hypertension -     CBC with Differential/Platelet -     CMP14+EGFR  Hypercholesteremia -     Lipid panel  Prostate cancer screening -     PSA, total and free  Essential hypertension -     amLODipine (NORVASC) 5 MG tablet; Take 1 tablet (5 mg total) by mouth in the morning.  Need for immunization against influenza -     Flu Vaccine QUAD High Dose(Fluad)  Current moderate episode of major depressive disorder  without prior episode ([place])  Other orders -     escitalopram (LEXAPRO) 10 MG tablet; Take 1 tablet (10 mg total) by mouth daily.      I have changed [name] "Steve"'s amLODipine. I am also having him start on escitalopram. Additionally, I am having him maintain his vitamin C, Vitamin D3, multivitamin with minerals, b complex vitamins, Calcium Citrate-Vitamin D (CITRACAL + D PO), atorvastatin, gabapentin, pantoprazole, Ozempic (0.25 or 0.5 MG/DOSE), and ticagrelor.  Allergies as of 04/23/2021       Reactions   Lisinopril Other (See Comments)   DIZZINESS         Medication List        Accurate as of April 23, 2021  7:49 PM. If you have any questions, ask your nurse or doctor.          amLODipine 5 MG tablet Commonly known as: NORVASC Take 1 tablet (5 mg total) by mouth in the morning. What changed: additional instructions Changed by: Claretta Fraise, MD   atorvastatin 80 MG tablet Commonly known as: LIPITOR Take 1 tablet (80 mg total) by mouth daily.   b complex vitamins tablet Take 1 tablet by mouth daily.   CITRACAL + D PO Take 1 tablet by mouth 3 (three) times daily.   escitalopram 10 MG tablet Commonly known as: LEXAPRO Take 1 tablet (10 mg total) by mouth daily. Started by: Claretta Fraise, MD   gabapentin 600 MG tablet Commonly known as: NEURONTIN Take 2 tablets (1,200 mg total) by mouth daily.   multivitamin with minerals Tabs tablet Take 1 tablet by mouth daily.   Ozempic (0.25 or 0.5 MG/DOSE) 2 MG/1.5ML Sopn Generic drug: Semaglutide(0.25 or 0.5MG/DOS) Inject 0.5 mg into the skin once a week.   pantoprazole 40 MG tablet Commonly known as: PROTONIX Take 1 tablet (40 mg total) by mouth daily. For stomach   ticagrelor 60 MG Tabs tablet Commonly known as: Brilinta Take 1 tablet (60 mg total) by mouth 2 (two) times daily.   vitamin C 500 MG tablet Commonly known as: ASCORBIC ACID Take 500 mg by mouth 2 (two) times daily.   Vitamin D3 50 MCG (2000 UT) capsule Take 2,000 Units by mouth 2 (two) times daily.         Follow-up: Return in about 6 weeks (around 06/04/2021).  Claretta Fraise, M.D.

## 2021-04-24 LAB — CMP14+EGFR
ALT: 18 IU/L (ref 0–44)
AST: 28 IU/L (ref 0–40)
Albumin/Globulin Ratio: 1.6 (ref 1.2–2.2)
Albumin: 4.1 g/dL (ref 3.7–4.7)
Alkaline Phosphatase: 99 IU/L (ref 44–121)
BUN/Creatinine Ratio: 12 (ref 10–24)
BUN: 17 mg/dL (ref 8–27)
Bilirubin Total: 0.5 mg/dL (ref 0.0–1.2)
CO2: 22 mmol/L (ref 20–29)
Calcium: 9.2 mg/dL (ref 8.6–10.2)
Chloride: 102 mmol/L (ref 96–106)
Creatinine, Ser: 1.4 mg/dL — ABNORMAL HIGH (ref 0.76–1.27)
Globulin, Total: 2.5 g/dL (ref 1.5–4.5)
Glucose: 97 mg/dL (ref 70–99)
Potassium: 5.1 mmol/L (ref 3.5–5.2)
Sodium: 139 mmol/L (ref 134–144)
Total Protein: 6.6 g/dL (ref 6.0–8.5)
eGFR: 53 mL/min/{1.73_m2} — ABNORMAL LOW (ref >59)

## 2021-04-24 LAB — CBC WITH DIFFERENTIAL/PLATELET
Basophils Absolute: 0.1 10*3/uL (ref 0.0–0.2)
Basos: 1 %
EOS (ABSOLUTE): 0.2 10*3/uL (ref 0.0–0.4)
Eos: 4 %
Hematocrit: 40.2 % (ref 37.5–51.0)
Hemoglobin: 13.3 g/dL (ref 13.0–17.7)
Immature Grans (Abs): 0 10*3/uL (ref 0.0–0.1)
Immature Granulocytes: 0 %
Lymphocytes Absolute: 1.6 10*3/uL (ref 0.7–3.1)
Lymphs: 29 %
MCH: 27.5 pg (ref 26.6–33.0)
MCHC: 33.1 g/dL (ref 31.5–35.7)
MCV: 83 fL (ref 79–97)
Monocytes Absolute: 0.7 10*3/uL (ref 0.1–0.9)
Monocytes: 13 %
Neutrophils Absolute: 2.9 10*3/uL (ref 1.4–7.0)
Neutrophils: 53 %
Platelets: 249 10*3/uL (ref 150–450)
RBC: 4.84 x10E6/uL (ref 4.14–5.80)
RDW: 15.1 % (ref 11.6–15.4)
WBC: 5.6 10*3/uL (ref 3.4–10.8)

## 2021-04-24 LAB — LIPID PANEL
Chol/HDL Ratio: 3.6 ratio (ref 0.0–5.0)
Cholesterol, Total: 153 mg/dL (ref 100–199)
HDL: 42 mg/dL (ref >39)
LDL Chol Calc (NIH): 85 mg/dL (ref 0–99)
Triglycerides: 149 mg/dL (ref 0–149)
VLDL Cholesterol Cal: 26 mg/dL (ref 5–40)

## 2021-04-24 LAB — PSA, TOTAL AND FREE
PSA, Free Pct: 46.7 %
PSA, Free: 0.28 ng/mL
Prostate Specific Ag, Serum: 0.6 ng/mL (ref 0.0–4.0)

## 2021-05-01 NOTE — Progress Notes (Signed)
Hello Tracker,  Your lab result is normal and/or stable.Some minor variations that are not significant are commonly marked abnormal, but do not represent any medical problem for you.  Best regards, Caiden Monsivais, M.D.

## 2021-05-02 ENCOUNTER — Telehealth: Payer: Self-pay

## 2021-05-02 NOTE — Telephone Encounter (Signed)
3 boxes of Ozempic from patient assistance placed in refrigerator in lab.  Patient aware.

## 2021-05-20 ENCOUNTER — Other Ambulatory Visit: Payer: Self-pay | Admitting: Family Medicine

## 2021-05-20 NOTE — Telephone Encounter (Signed)
Pt is aware 30 days sent to pharmacy, and he NTBS, he will call later to make appt

## 2021-05-20 NOTE — Telephone Encounter (Signed)
30 days sent -  NTBS

## 2021-05-29 DIAGNOSIS — H25813 Combined forms of age-related cataract, bilateral: Secondary | ICD-10-CM | POA: Diagnosis not present

## 2021-05-29 DIAGNOSIS — I10 Essential (primary) hypertension: Secondary | ICD-10-CM | POA: Diagnosis not present

## 2021-05-29 DIAGNOSIS — H5210 Myopia, unspecified eye: Secondary | ICD-10-CM | POA: Diagnosis not present

## 2021-05-29 DIAGNOSIS — E7800 Pure hypercholesterolemia, unspecified: Secondary | ICD-10-CM | POA: Diagnosis not present

## 2021-05-29 DIAGNOSIS — Z0100 Encounter for examination of eyes and vision without abnormal findings: Secondary | ICD-10-CM | POA: Diagnosis not present

## 2021-06-16 ENCOUNTER — Other Ambulatory Visit: Payer: Self-pay | Admitting: Family Medicine

## 2021-06-26 ENCOUNTER — Ambulatory Visit: Payer: Medicare HMO | Admitting: Family Medicine

## 2021-07-21 ENCOUNTER — Other Ambulatory Visit: Payer: Self-pay | Admitting: Family Medicine

## 2021-08-06 DIAGNOSIS — R82992 Hyperoxaluria: Secondary | ICD-10-CM | POA: Diagnosis not present

## 2021-08-06 DIAGNOSIS — N5201 Erectile dysfunction due to arterial insufficiency: Secondary | ICD-10-CM | POA: Diagnosis not present

## 2021-08-06 DIAGNOSIS — N200 Calculus of kidney: Secondary | ICD-10-CM | POA: Diagnosis not present

## 2021-08-16 ENCOUNTER — Other Ambulatory Visit: Payer: Self-pay | Admitting: Family Medicine

## 2021-08-26 ENCOUNTER — Other Ambulatory Visit: Payer: Self-pay | Admitting: Family Medicine

## 2021-08-26 NOTE — Telephone Encounter (Signed)
Stacks. NTBS 30 days given 07/22/21 ?

## 2021-08-27 MED ORDER — TICAGRELOR 60 MG PO TABS: 60.0000 mg | ORAL_TABLET | Freq: Two times a day (BID) | ORAL | 0 refills | Status: DC

## 2021-08-27 NOTE — Addendum Note (Signed)
Addended by: Antonietta Barcelona D on: 08/27/2021 09:59 AM ? ? Modules accepted: Orders ? ?

## 2021-08-27 NOTE — Telephone Encounter (Signed)
Pt made first available appt with Dr. Livia Snellen on 09/10/2021, needs refill sent to pharm ?

## 2021-09-03 ENCOUNTER — Ambulatory Visit (INDEPENDENT_AMBULATORY_CARE_PROVIDER_SITE_OTHER): Payer: Medicare HMO | Admitting: Family Medicine

## 2021-09-03 ENCOUNTER — Encounter: Payer: Self-pay | Admitting: Family Medicine

## 2021-09-03 VITALS — BP 149/64 | HR 67 | Temp 97.8°F | Ht 70.0 in | Wt 334.4 lb

## 2021-09-03 DIAGNOSIS — G8929 Other chronic pain: Secondary | ICD-10-CM

## 2021-09-03 DIAGNOSIS — E7800 Pure hypercholesterolemia, unspecified: Secondary | ICD-10-CM

## 2021-09-03 DIAGNOSIS — Z23 Encounter for immunization: Secondary | ICD-10-CM | POA: Diagnosis not present

## 2021-09-03 DIAGNOSIS — M25561 Pain in right knee: Secondary | ICD-10-CM | POA: Diagnosis not present

## 2021-09-03 DIAGNOSIS — M25562 Pain in left knee: Secondary | ICD-10-CM

## 2021-09-03 MED ORDER — GABAPENTIN 600 MG PO TABS: 1200.0000 mg | ORAL_TABLET | Freq: Every day | ORAL | 3 refills | Status: DC

## 2021-09-03 MED ORDER — ATORVASTATIN CALCIUM 80 MG PO TABS: 80.0000 mg | ORAL_TABLET | Freq: Every day | ORAL | 3 refills | Status: DC

## 2021-09-03 MED ORDER — TICAGRELOR 60 MG PO TABS: 60.0000 mg | ORAL_TABLET | Freq: Two times a day (BID) | ORAL | 3 refills | Status: DC

## 2021-09-03 MED ORDER — PANTOPRAZOLE SODIUM 40 MG PO TBEC: 40.0000 mg | DELAYED_RELEASE_TABLET | Freq: Every day | ORAL | 3 refills | Status: DC

## 2021-09-03 NOTE — Progress Notes (Signed)
? ?Subjective:  ?Patient ID: Dennis Spencer, male    DOB: 14-Sep-1945  Age: 76 y.o. MRN: [id number] ? ?CC: Medical Management of Chronic Issues ? ? ?HPI ?Dennis Spencer presents for  follow-up of hypertension. Patient has no history of headache chest pain or shortness of breath or recent cough. Patient also denies symptoms of TIA such as focal numbness or weakness. Patient denies side effects from medication. States taking it regularly. ? ?Getting some bruising from using the Brilinta.  However it is working well for him and he is having no bleeding. ? ?Patient in for follow-up of elevated cholesterol. Doing well without complaints on current medication. Denies side effects of statin including myalgia and arthralgia and nausea. Also in today for liver function testing. Currently no chest pain, shortness of breath or other cardiovascular related symptoms noted. ? ?Patient in for follow-up of GERD. Currently asymptomatic taking  PPI daily. There is no chest pain or heartburn. No hematemesis and no melena. No dysphagia or choking. Onset is remote. Progression is stable. Complicating factors, none. ? ? ? ? ? 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  ? ?He has a past surgical history that includes Cystoscopy with retrograde pyelogram, ureteroscopy and stent placement (Bilateral, 06/18/2013); Holmium laser application (Bilateral, 06/18/2013); Tracheostomy (1987);  Nephrolithotomy (Left, 08/08/2013); Cystoscopy with retrograde pyelogram, ureteroscopy and stent placement (Right, 08/08/2013); Nephrolithotomy (Left, 08/10/2013); Cystoscopy with retrograde pyelogram, ureteroscopy and stent placement (Right, 08/10/2013); Holmium laser application (Left, 12/10/8561); Cholecystectomy (N/A, 06/15/2015); Colonoscopy (last one 03-28-2016); Cystoscopy with retrograde pyelogram, ureteroscopy and stent placement (Bilateral, 05/22/2017); Holmium laser application (Bilateral, 05/22/2017); Cystoscopy with retrograde pyelogram, ureteroscopy and stent placement (Bilateral, 06/05/2017); Cystoscopy/ureteroscopy/holmium laser/stent placement (Left, 06/26/2017); Cystoscopy w/ ureteral stent removal (Right, 06/26/2017); Tonsillectomy (child); Gastric Roux-En-Y (2004    '@[facility]'$ ); Percutaneous nephrostolithotomy (Left, 09-19-2009   dr Diona Fanti  '@[facility]'$ ); Ureteroscopy with holmium laser lithotripsy (Left, 04-14-2002   dr Diona Fanti  '@[facility]'$ ); Orchiopexy (child); Cystoscopy with retrograde pyelogram, ureteroscopy and stent placement (Bilateral, 08/13/2018); Holmium laser application (Bilateral, 08/13/2018); Wisdom tooth extraction; Cystoscopy with retrograde pyelogram, ureteroscopy and stent placement (Bilateral, 10/22/2018); Holmium laser application (Bilateral, 10/22/2018); Cystoscopy w/ ureteral stent removal (Bilateral, 10/22/2018); Cystoscopy with retrograde pyelogram, ureteroscopy and stent placement (Bilateral, 12/23/2019); Holmium laser application (Bilateral, 12/23/2019); Cystoscopy with litholapaxy (N/A, 12/23/2019); and Cystoscopy with retrograde pyelogram, ureteroscopy and stent placement (Bilateral, 01/06/2020).  ? ?His family history includes Breast cancer in his maternal grandmother; Cancer in his mother; Colon cancer in his maternal grandfather; Diabetes in his father; Heart failure in his father; Hypertension in his father.He reports that he quit smoking about 53 years ago. His smoking use included cigarettes. He has a  8.00 pack-year smoking history. He has never used smokeless tobacco. He reports that he does not currently use alcohol. He reports that he does not use drugs. ? ?Current Outpatient Medications on File Prior to Visit  ?Medication Sig Dispense Refill  ? amLODipine (NORVASC) 5 MG tablet Take 1 tablet (5 mg total) by mouth in the morning. 90 tablet 3  ? b complex vitamins tablet Take 1 tablet  by mouth daily.    ? Calcium Citrate-Vitamin D (CITRACAL + D PO) Take 1 tablet by mouth 3 (three) times daily.     ? Cholecalciferol (VITAMIN D3) 2000 UNITS capsule Take 2,000 Units by mouth 2 (two) times daily.     ? escitalopram (LEXAPRO) 10 MG tablet Take 1 tablet (10 mg total) by mouth daily. 90 tablet 1  ? Multiple Vitamin (MULTIVITAMIN WITH MINERALS) TABS tablet Take 1 tablet by mouth daily.    ? Semaglutide,0.25 or 0.'5MG'$ /DOS, (OZEMPIC, 0.25 OR 0.5 MG/DOSE,) 2 MG/1.5ML SOPN Inject 0.5 mg into the skin once a week. 1 each 2  ? vitamin C (ASCORBIC ACID) 500 MG tablet Take 500 mg by mouth 2 (two) times daily.    ? ?No current facility-administered medications on file prior to visit.  ? ? ?ROS ?Review of Systems  ?Constitutional:  Negative for fever.  ?Respiratory:  Negative for shortness of breath.   ?Cardiovascular:  Negative for chest pain.  ?Musculoskeletal:  Negative for arthralgias.  ?Skin:  Negative for rash.  ? ?Objective:  ?BP (!) 149/64   Pulse 67   Temp 97.8 ?F (36.6 ?C)   Ht '5\' 10"'$  (1.778 m)   Wt (!) 334 lb 6.4 oz (151.7 kg)   SpO2 98%   BMI 47.98 kg/m?  ? ?BP Readings from Last 3 Encounters:  ?09/03/21 (!) 149/64  ?04/23/21 139/63  ?12/20/20 136/74  ? ? ?Wt Readings from Last 3 Encounters:  ?09/03/21 (!) 334 lb 6.4 oz (151.7 kg)  ?04/23/21 (!) 346 lb (156.9 kg)  ?12/20/20 (!) 340 lb 3.2 oz (154.3 kg)  ? ? ? ?Physical Exam ?Vitals reviewed.  ?Constitutional:   ?   Appearance: He is well-developed.  ?HENT:  ?   Head: Normocephalic and atraumatic.  ?   Right Ear: External ear normal.  ?   Left Ear: External ear  normal.  ?   Mouth/Throat:  ?   Pharynx: No oropharyngeal exudate or posterior oropharyngeal erythema.  ?Eyes:  ?   Pupils: Pupils are equal, round, and reactive to light.  ?Cardiovascular:  ?   Rate and Rhythm: Normal rate and regular rhythm.  ?   Heart sounds: No murmur heard. ?Pulmonary:  ?   Effort: No respiratory distress.  ?   Breath sounds: Normal breath sounds.  ?Musculoskeletal:  ?   Cervical back: Normal range of motion and neck supple.  ?Neurological:  ?   Mental Status: He is alert and oriented to person, place, and time.  ? ? ? ? ?Assessment & Plan:  ? ?Dennis Spencer was seen today for medical management of chronic issues. ? ?Diagnoses and all orders for this visit: ? ?Need for shingles vaccine ?-     Varicella-zoster vaccine IM (Shingrix) ? ?Hypercholesteremia ?-     atorvastatin (LIPITOR) 80 MG tablet; Take 1 tablet (80 mg total) by mouth daily. ? ?Bilateral chronic knee pain ?-     gabapentin (NEURONTIN) 600 MG tablet; Take 2 tablets (1,200 mg total) by mouth daily. ? ?Severe obesity (BMI >= 40) (HCC) ? ?Other orders ?-     pantoprazole (PROTONIX) 40 MG tablet; Take 1 tablet (40 mg total) by mouth daily. For stomach ?-     ticagrelor (BRILINTA) 60 MG TABS tablet; Take 1 tablet (60 mg total) by mouth 2 (two) times daily. ? ? ?Allergies as of 09/03/2021   ? ?   Reactions  ? Lisinopril Other (See Comments)  ? DIZZINESS  ? ?  ? ?  ?Medication  List  ?  ? ?  ? Accurate as of September 03, 2021  5:07 PM. If you have any questions, ask your nurse or doctor.  ?  ?  ? ?  ? ?amLODipine 5 MG tablet ?Commonly known as: NORVASC ?Take 1 tablet (5 mg total) by mouth in the morning. ?  ?atorvastatin 80 MG tablet ?Commonly known as: LIPITOR ?Take 1 tablet (80 mg total) by mouth daily. ?  ?b complex vitamins tablet ?Take 1 tablet by mouth daily. ?  ?CITRACAL + D PO ?Take 1 tablet by mouth 3 (three) times daily. ?  ?escitalopram 10 MG tablet ?Commonly known as: LEXAPRO ?Take 1 tablet (10 mg total) by mouth daily. ?  ?gabapentin  600 MG tablet ?Commonly known as: NEURONTIN ?Take 2 tablets (1,200 mg total) by mouth daily. ?  ?multivitamin with minerals Tabs tablet ?Take 1 tablet by mouth daily. ?  ?Ozempic (0.25 or 0.5 MG/DOSE) 2 MG/1.5ML S

## 2021-09-10 ENCOUNTER — Ambulatory Visit: Payer: Medicare HMO | Admitting: Family Medicine

## 2021-10-16 ENCOUNTER — Other Ambulatory Visit: Payer: Self-pay | Admitting: Family Medicine

## 2021-10-25 NOTE — Patient Instructions (Signed)
Dennis Spencer , Thank you for taking time to come for your Medicare Wellness Visit. I appreciate your ongoing commitment to your health goals. Please review the following plan we discussed and let me know if I can assist you in the future.   Screening recommendations/referrals: Colonoscopy: Done 03/28/2016 - Repeat in 5 years *due Recommended yearly ophthalmology/optometry visit for glaucoma screening and checkup Recommended yearly dental visit for hygiene and checkup  Vaccinations: Influenza vaccine: Done 04/23/2021 - Repeat annually Pneumococcal vaccine: Done 01/08/2016 & 08/12/2017  Tdap vaccine: Done 01/31/2020 - Repeat in 10 years Shingles vaccine: Done 08/14/2021 - get second dose at next visit   Covid-19:  Done 06/28/2019, 07/26/2019, 03/14/2020, & 12/26/2020  Advanced directives: ***  Conditions/risks identified: ***  Next appointment: Follow up in one year for your annual wellness visit. ***  Preventive Care 75 Years and Older, Male  Preventive care refers to lifestyle choices and visits with your health care provider that can promote health and wellness. What does preventive care include? A yearly physical exam. This is also called an annual well check. Dental exams once or twice a year. Routine eye exams. Ask your health care provider how often you should have your eyes checked. Personal lifestyle choices, including: Daily care of your teeth and gums. Regular physical activity. Eating a healthy diet. Avoiding tobacco and drug use. Limiting alcohol use. Practicing safe sex. Taking low doses of aspirin every day. Taking vitamin and mineral supplements as recommended by your health care provider. What happens during an annual well check? The services and screenings done by your health care provider during your annual well check will depend on your age, overall health, lifestyle risk factors, and family history of disease. Counseling  Your health care provider may ask you questions  about your: Alcohol use. Tobacco use. Drug use. Emotional well-being. Home and relationship well-being. Sexual activity. Eating habits. History of falls. Memory and ability to understand (cognition). Work and work Statistician. Screening  You may have the following tests or measurements: Height, weight, and BMI. Blood pressure. Lipid and cholesterol levels. These may be checked every 5 years, or more frequently if you are over 75 years old. Skin check. Lung cancer screening. You may have this screening every year starting at age 75 if you have a 30-pack-year history of smoking and currently smoke or have quit within the past 15 years. Fecal occult blood test (FOBT) of the stool. You may have this test every year starting at age 75. Flexible sigmoidoscopy or colonoscopy. You may have a sigmoidoscopy every 5 years or a colonoscopy every 10 years starting at age 75. Prostate cancer screening. Recommendations will vary depending on your family history and other risks. Hepatitis C blood test. Hepatitis B blood test. Sexually transmitted disease (STD) testing. Diabetes screening. This is done by checking your blood sugar (glucose) after you have not eaten for a while (fasting). You may have this done every 1-3 years. Abdominal aortic aneurysm (AAA) screening. You may need this if you are a current or former smoker. Osteoporosis. You may be screened starting at age 83 if you are at high risk. Talk with your health care provider about your test results, treatment options, and if necessary, the need for more tests. Vaccines  Your health care provider may recommend certain vaccines, such as: Influenza vaccine. This is recommended every year. Tetanus, diphtheria, and acellular pertussis (Tdap, Td) vaccine. You may need a Td booster every 10 years. Zoster vaccine. You may need this after age  75. Pneumococcal 13-valent conjugate (PCV13) vaccine. One dose is recommended after age 75. Pneumococcal  polysaccharide (PPSV23) vaccine. One dose is recommended after age 75. Talk to your health care provider about which screenings and vaccines you need and how often you need them. This information is not intended to replace advice given to you by your health care provider. Make sure you discuss any questions you have with your health care provider. Document Released: 05/25/2015 Document Revised: 01/16/2016 Document Reviewed: 02/27/2015 Elsevier Interactive Patient Education  2017 [employer] Prevention in the Home Falls can cause injuries. They can happen to people of all ages. There are many things you can do to make your home safe and to help prevent falls. What can I do on the outside of my home? Regularly fix the edges of walkways and driveways and fix any cracks. Remove anything that might make you trip as you walk through a door, such as a raised step or threshold. Trim any bushes or trees on the path to your home. Use bright outdoor lighting. Clear any walking paths of anything that might make someone trip, such as rocks or tools. Regularly check to see if handrails are loose or broken. Make sure that both sides of any steps have handrails. Any raised decks and porches should have guardrails on the edges. Have any leaves, snow, or ice cleared regularly. Use sand or salt on walking paths during winter. Clean up any spills in your garage right away. This includes oil or grease spills. What can I do in the bathroom? Use night lights. Install grab bars by the toilet and in the tub and shower. Do not use towel bars as grab bars. Use non-skid mats or decals in the tub or shower. If you need to sit down in the shower, use a plastic, non-slip stool. Keep the floor dry. Clean up any water that spills on the floor as soon as it happens. Remove soap buildup in the tub or shower regularly. Attach bath mats securely with double-sided non-slip rug tape. Do not have throw rugs and other  things on the floor that can make you trip. What can I do in the bedroom? Use night lights. Make sure that you have a light by your bed that is easy to reach. Do not use any sheets or blankets that are too big for your bed. They should not hang down onto the floor. Have a firm chair that has side arms. You can use this for support while you get dressed. Do not have throw rugs and other things on the floor that can make you trip. What can I do in the kitchen? Clean up any spills right away. Avoid walking on wet floors. Keep items that you use a lot in easy-to-reach places. If you need to reach something above you, use a strong step stool that has a grab bar. Keep electrical cords out of the way. Do not use floor polish or wax that makes floors slippery. If you must use wax, use non-skid floor wax. Do not have throw rugs and other things on the floor that can make you trip. What can I do with my stairs? Do not leave any items on the stairs. Make sure that there are handrails on both sides of the stairs and use them. Fix handrails that are broken or loose. Make sure that handrails are as long as the stairways. Check any carpeting to make sure that it is firmly attached to the stairs. Fix  any carpet that is loose or worn. Avoid having throw rugs at the top or bottom of the stairs. If you do have throw rugs, attach them to the floor with carpet tape. Make sure that you have a light switch at the top of the stairs and the bottom of the stairs. If you do not have them, ask someone to add them for you. What else can I do to help prevent falls? Wear shoes that: Do not have high heels. Have rubber bottoms. Are comfortable and fit you well. Are closed at the toe. Do not wear sandals. If you use a stepladder: Make sure that it is fully opened. Do not climb a closed stepladder. Make sure that both sides of the stepladder are locked into place. Ask someone to hold it for you, if possible. Clearly  mark and make sure that you can see: Any grab bars or handrails. First and last steps. Where the edge of each step is. Use tools that help you move around (mobility aids) if they are needed. These include: Canes. Walkers. Scooters. Crutches. Turn on the lights when you go into a dark area. Replace any light bulbs as soon as they burn out. Set up your furniture so you have a clear path. Avoid moving your furniture around. If any of your floors are uneven, fix them. If there are any pets around you, be aware of where they are. Review your medicines with your doctor. Some medicines can make you feel dizzy. This can increase your chance of falling. Ask your doctor what other things that you can do to help prevent falls. This information is not intended to replace advice given to you by your health care provider. Make sure you discuss any questions you have with your health care provider. Document Released: 02/22/2009 Document Revised: 10/04/2015 Document Reviewed: 06/02/2014 Elsevier Interactive Patient Education  2017 [employer].

## 2021-10-28 ENCOUNTER — Ambulatory Visit (INDEPENDENT_AMBULATORY_CARE_PROVIDER_SITE_OTHER): Payer: Medicare HMO

## 2021-10-28 VITALS — Wt 322.0 lb

## 2021-10-28 DIAGNOSIS — Z0000 Encounter for general adult medical examination without abnormal findings: Secondary | ICD-10-CM | POA: Diagnosis not present

## 2021-10-28 DIAGNOSIS — Z1211 Encounter for screening for malignant neoplasm of colon: Secondary | ICD-10-CM

## 2021-10-28 NOTE — Progress Notes (Signed)
Subjective:   Dennis Spencer is a 76 y.o. male who presents for Medicare Annual/Subsequent preventive examination.  Virtual Visit via Telephone Note  I connected with  Dennis Spencer on 10/28/21 at  9:45 AM EDT by telephone and verified that I am speaking with the correct person using two identifiers.  Location: Patient: Home Provider: [facility] Persons participating in the virtual visit: patient/Nurse Health Advisor   I discussed the limitations, risks, security and privacy concerns of performing an evaluation and management service by telephone and the availability of in person appointments. The patient expressed understanding and agreed to proceed.  Interactive audio and video telecommunications were attempted between this nurse and patient, however failed, due to patient having technical difficulties OR patient did not have access to video capability.  We continued and completed visit with audio only.  Some vital signs may be absent or patient reported.   Dennis Mirelez E Kimla Furth, LPN   Review of Systems     Cardiac Risk Factors include: advanced age (>40mn, >>74women);male gender;dyslipidemia;hypertension;sedentary lifestyle;obesity (BMI >30kg/m2)     Objective:    Today's Vitals   10/28/21 0948  Weight: (!) 322 lb (146.1 kg)   Body mass index is 46.2 kg/m.     10/28/2021    9:54 AM 10/25/2020   10:54 AM 01/23/2020    2:25 PM 01/04/2020    1:03 PM 12/16/2019    1:19 PM 10/25/2019   10:47 AM 01/26/2019    6:56 PM  Advanced Directives  Does Patient Have a Medical Advance Directive? No No No No No No No  Would patient like information on creating a medical advance directive? Yes (MAU/Ambulatory/Procedural Areas - Information given) Yes (MAU/Ambulatory/Procedural Areas - Information given) No - Patient declined  No - Patient declined No - Patient declined     Current Medications (verified) Outpatient Encounter Medications as of 10/28/2021  Medication Sig   amLODipine (NORVASC) 5  MG tablet Take 1 tablet (5 mg total) by mouth in the morning.   atorvastatin (LIPITOR) 80 MG tablet Take 1 tablet (80 mg total) by mouth daily.   b complex vitamins tablet Take 1 tablet by mouth daily.   Calcium Citrate-Vitamin D (CITRACAL + D PO) Take 1 tablet by mouth 3 (three) times daily.    Cholecalciferol (VITAMIN D3) 2000 UNITS capsule Take 2,000 Units by mouth 2 (two) times daily.    escitalopram (LEXAPRO) 10 MG tablet TAKE 1 TABLET BY MOUTH EVERY DAY   gabapentin (NEURONTIN) 600 MG tablet Take 2 tablets (1,200 mg total) by mouth daily.   Multiple Vitamin (MULTIVITAMIN WITH MINERALS) TABS tablet Take 1 tablet by mouth daily.   pantoprazole (PROTONIX) 40 MG tablet Take 1 tablet (40 mg total) by mouth daily. For stomach   ticagrelor (BRILINTA) 60 MG TABS tablet Take 1 tablet (60 mg total) by mouth 2 (two) times daily.   vitamin C (ASCORBIC ACID) 500 MG tablet Take 500 mg by mouth 2 (two) times daily.   Semaglutide,0.25 or 0.'5MG'$ /DOS, (OZEMPIC, 0.25 OR 0.5 MG/DOSE,) 2 MG/1.5ML SOPN Inject 0.5 mg into the skin once a week. (Patient not taking: Reported on 10/28/2021)   No facility-administered encounter medications on file as of 10/28/2021.    Allergies (verified) Lisinopril   History: Past Medical History:  Diagnosis Date   Anticoagulant long-term use    brilinta   Arthritis    Knees   Carotid stenosis, bilateral    per duplex 04-23-2018  bilateral ICA <50%   Chronic cough  per pt this normal due to tracheostomy   CKD (chronic kidney disease), stage III ([place]) 11/10/2013   Dyspnea    GERD (gastroesophageal reflux disease)    History of CVA (cerebrovascular accident) 04/13/2018   chronic lacunar infarct--- per pt no residuals   History of kidney stones    multiple kidney stones   History of osteomyelitis age 27   LLE   Hyperlipidemia    Hypertension    OSA (obstructive sleep apnea) study in epic 09-25-2005  severe osa   1981 s/p tracheostomy, tongue reduction and  septoplasty;  LEAVES TRACH OPEN AT NIGHT FOR SLEEP APNEA   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   uncaps at night due to sleep apnea - caps during the day   Renal calculi    bilateral   Severe obesity (BMI >= 40) ([place])    Stroke ([place]) 2019   Possible TIA in the past   Urgency of urination    Wears glasses    Past Surgical History:  Procedure Laterality Date   CHOLECYSTECTOMY N/A 06/15/2015   Procedure: LAPAROSCOPIC CHOLECYSTECTOMY;  Surgeon: Ralene Ok, MD;  Location: [facility];  Service: General;  Laterality: N/A;   COLONOSCOPY  last one 03-28-2016   CYSTOSCOPY W/ URETERAL STENT REMOVAL Right 06/26/2017   Procedure: CYSTOSCOPY WITH STENT REMOVAL;  Surgeon: Alexis Frock, MD;  [facility];  Service: Urology;  Laterality: Right;   CYSTOSCOPY W/ URETERAL STENT REMOVAL Bilateral 10/22/2018   Procedure: CYSTOSCOPY WITH STENT REMOVAL;  Surgeon: Alexis Frock, MD;  [facility];  Service: Urology;  Laterality: Bilateral;   CYSTOSCOPY WITH LITHOLAPAXY N/A 12/23/2019   Procedure: CYSTOSCOPY WITH LITHOLAPAXY;  Surgeon: Alexis Frock, MD;  [facility];  Service: Urology;  Laterality: N/A;   CYSTOSCOPY WITH RETROGRADE PYELOGRAM, URETEROSCOPY AND STENT PLACEMENT Bilateral 06/18/2013   Procedure: CYSTOSCOPY WITH RETROGRADE PYELOGRAM, Left URETEROSCOPY with laser , AND bilateral STENT PLACEMENT;  Surgeon: Alexis Frock, MD;  [facility];  Service: Urology;  Laterality: Bilateral;   CYSTOSCOPY WITH RETROGRADE PYELOGRAM, URETEROSCOPY AND STENT PLACEMENT Right 08/08/2013   Procedure: CYSTOSCOPY WITH RIGHT RETROGRADE PYELOGRAM, URETEROSCOPY AND STENT PLACEMENT, stone extraction;  Surgeon: Alexis Frock, MD;  [facility];  Service: Urology;  Laterality: Right;   CYSTOSCOPY WITH RETROGRADE PYELOGRAM, URETEROSCOPY AND STENT PLACEMENT Right 08/10/2013   Procedure: 2ND STAGE CYSTOSCOPY WITH RETROGRADE PYELOGRAM, URETEROSCOPY AND STENT EXCHANGE;  Surgeon: Alexis Frock, MD;   [facility];  Service: Urology;  Laterality: Right;   CYSTOSCOPY WITH RETROGRADE PYELOGRAM, URETEROSCOPY AND STENT PLACEMENT Bilateral 05/22/2017   Procedure: CYSTOSCOPY WITH RETROGRADE PYELOGRAM, URETEROSCOPY AND STENT PLACEMENT;  Surgeon: Alexis Frock, MD;  [facility];  Service: Urology;  Laterality: Bilateral;   CYSTOSCOPY WITH RETROGRADE PYELOGRAM, URETEROSCOPY AND STENT PLACEMENT Bilateral 06/05/2017   Procedure: CYSTOSCOPY WITH RETROGRADE PYELOGRAM, URETEROSCOPY AND STENT PLACEMENT;  Surgeon: Alexis Frock, MD;  [facility];  Service: Urology;  Laterality: Bilateral;   CYSTOSCOPY WITH RETROGRADE PYELOGRAM, URETEROSCOPY AND STENT PLACEMENT Bilateral 08/13/2018   Procedure: CYSTOSCOPY WITH RETROGRADE PYELOGRAM, URETEROSCOPY AND STENT PLACEMENT;  Surgeon: Alexis Frock, MD;  [facility];  Service: Urology;  Laterality: Bilateral;  30 MINS   CYSTOSCOPY WITH RETROGRADE PYELOGRAM, URETEROSCOPY AND STENT PLACEMENT Bilateral 10/22/2018   Procedure: CYSTOSCOPY WITH RETROGRADE PYELOGRAM, URETEROSCOPY  STONE BASKETRY AND STENT PLACEMENT;  Surgeon: Alexis Frock, MD;  [facility];  Service: Urology;  Laterality: Bilateral;   CYSTOSCOPY WITH RETROGRADE PYELOGRAM, URETEROSCOPY AND STENT PLACEMENT Bilateral 12/23/2019   Procedure: CYSTOSCOPY WITH  RETROGRADE PYELOGRAM, URETEROSCOPY AND STENT PLACEMENT;  Surgeon: Alexis Frock, MD;  [facility];  Service: Urology;  Laterality: Bilateral;  35 MINS   CYSTOSCOPY WITH RETROGRADE PYELOGRAM, URETEROSCOPY AND STENT PLACEMENT Bilateral 01/06/2020   Procedure: CYSTOSCOPY WITH RETROGRADE PYELOGRAM, URETEROSCOPY AND STENT EXCHANGE, RETROGRADE, BASKETING OF STONES;  Surgeon: Alexis Frock, MD;  [facility];  Service: Urology;  Laterality: Bilateral;  90 MINS   CYSTOSCOPY/URETEROSCOPY/HOLMIUM LASER/STENT PLACEMENT Left 06/26/2017   Procedure: CYSTOSCOPY/URETEROSCOPY third stage/HOLMIUM LASER/STENT PLACEMENT retrograde pylegram;   Surgeon: Alexis Frock, MD;  [facility];  Service: Urology;  Laterality: Left;   GASTRIC ROUX-EN-Y  2004    '@[facility]'$    HOLMIUM LASER APPLICATION Bilateral 06/12/2246   Procedure: HOLMIUM LASER APPLICATION;  Surgeon: Alexis Frock, MD;  [facility];  Service: Urology;  Laterality: Bilateral;   HOLMIUM LASER APPLICATION Left 06/17/35   Procedure: HOLMIUM LASER APPLICATION;  Surgeon: Alexis Frock, MD;  [facility];  Service: Urology;  Laterality: Left;   HOLMIUM LASER APPLICATION Bilateral 0/48/8891   Procedure: HOLMIUM LASER APPLICATION;  Surgeon: Alexis Frock, MD;  [facility];  Service: Urology;  Laterality: Bilateral;   HOLMIUM LASER APPLICATION Bilateral 10/18/4501   Procedure: HOLMIUM LASER APPLICATION;  Surgeon: Alexis Frock, MD;  [facility];  Service: Urology;  Laterality: Bilateral;   HOLMIUM LASER APPLICATION Bilateral 8/88/2800   Procedure: HOLMIUM LASER APPLICATION;  Surgeon: Alexis Frock, MD;  [facility];  Service: Urology;  Laterality: Bilateral;   HOLMIUM LASER APPLICATION Bilateral 3/49/1791   Procedure: HOLMIUM LASER APPLICATION;  Surgeon: Alexis Frock, MD;  [facility];  Service: Urology;  Laterality: Bilateral;   NEPHROLITHOTOMY Left 08/08/2013   Procedure: 1ST STAGE NEPHROLITHOTOMY PERCUTANEOUS WITH SURGEON ACCESS;  Surgeon: Alexis Frock, MD;  [facility];  Service: Urology;  Laterality: Left;   NEPHROLITHOTOMY Left 08/10/2013   Procedure: NEPHROLITHOTOMY PERCUTANEOUS SECOND LOOK/LEFT DIGITAL URETEROSCOPY/BASKETING OF STONE/EXCHANGE OF LEFT URETERAL STENT;  Surgeon: Alexis Frock, MD;  [facility];  Service: Urology;  Laterality: Left;   ORCHIOPEXY  child   unlateral undescended testis   PERCUTANEOUS NEPHROSTOLITHOTOMY Left 09-19-2009   dr Diona Fanti  '@[facility]'$    TONSILLECTOMY  child   TRACHEOSTOMY  1987   s/p tongue reduction and nasal septum deviation repair for  Sleep apnea    URETEROSCOPY WITH HOLMIUM LASER LITHOTRIPSY  Left 04-14-2002   dr Diona Fanti  '@[facility]'$    WISDOM TOOTH EXTRACTION     Family History  Problem Relation Age of Onset   Cancer Mother        Ovarian, died age 61.   Diabetes Father    Hypertension Father    Heart failure Father        Died of MI age 33.   Breast cancer Maternal Grandmother    Colon cancer Maternal Grandfather    Esophageal cancer Neg Hx    Stomach cancer Neg Hx    Rectal cancer Neg Hx    Social History   Socioeconomic History   Marital status: Married    Spouse name: Helene Kelp   Number of children: 3   Years of education: Not on file   Highest education level: Master's degree (e.g., MA, MS, MEng, MEd, MSW, MBA)  Occupational History   Occupation: Retired, English as a second language teacher.    Employer: [employer]  Tobacco Use   Smoking status: Former    Packs/day: 1.00    Years: 8.00    Total pack years: 8.00    Types: Cigarettes    Quit date: 05/12/1968  Years since quitting: 53.4   Smokeless tobacco: Never  Vaping Use   Vaping Use: Never used  Substance and Sexual Activity   Alcohol use: Not Currently   Drug use: Never   Sexual activity: Yes    Birth control/protection: None  Other Topics Concern   Not on file  Social History Narrative   Married, wife Helene Kelp.  1 daughter, 2 sons. Daughter and grandchildren currently reside with them. 1 dog and 1 cat as pets. Enjoys reading and photography.    Social Determinants of Health   Financial Resource Strain: Low Risk  (10/28/2021)   Overall Financial Resource Strain (CARDIA)    Difficulty of Paying Living Expenses: Not hard at all  Food Insecurity: No Food Insecurity (10/28/2021)   Hunger Vital Sign    Worried About Running Out of Food in the Last Year: Never true    Ran Out of Food in the Last Year: Never true  Transportation Needs: No Transportation Needs (10/28/2021)   PRAPARE - Hydrologist (Medical): No    Lack of Transportation (Non-Medical): No  Physical Activity: Insufficiently Active  (10/28/2021)   Exercise Vital Sign    Days of Exercise per Week: 7 days    Minutes of Exercise per Session: 20 min  Stress: No Stress Concern Present (10/28/2021)   [place]    Feeling of Stress : Only a little  Social Connections: Moderately Isolated (10/28/2021)   Social Connection and Isolation Panel [NHANES]    Frequency of Communication with Friends and Family: More than three times a week    Frequency of Social Gatherings with Friends and Family: Once a week    Attends Religious Services: Never    Marine scientist or Organizations: No    Attends Music therapist: Never    Marital Status: Married    Tobacco Counseling Counseling given: Not Answered   Clinical Intake:  Pre-visit preparation completed: Yes  Pain : No/denies pain     BMI - recorded: 46.2 Nutritional Status: BMI > 30  Obese Nutritional Risks: None Diabetes: No  How often do you need to have someone help you when you read instructions, pamphlets, or other written materials from your doctor or pharmacy?: 1 - Never  Diabetic? no  Interpreter Needed?: No  Information entered by :: Oyindamola Key, LPN   Activities of Daily Living    10/28/2021    9:53 AM  In your present state of health, do you have any difficulty performing the following activities:  Hearing? 0  Vision? 0  Difficulty concentrating or making decisions? 0  Walking or climbing stairs? 0  Dressing or bathing? 0  Doing errands, shopping? 0  Preparing Food and eating ? N  Using the Toilet? N  In the past six months, have you accidently leaked urine? N  Do you have problems with loss of bowel control? N  Managing your Medications? N  Managing your Finances? N  Housekeeping or managing your Housekeeping? N    Patient Care Team: Claretta Fraise, MD as PCP - General (Family Medicine) Alexis Frock, MD as Consulting Physician (Urology) Sydnee Cabal,  MD as Consulting Physician (Orthopedic Surgery) Celestia Khat, Suarez (Optometry)  Indicate any recent Medical Services you may have received from other than [facility] providers in the past year (date may be approximate).     Assessment:   This is a routine wellness examination for [place].  Hearing/Vision screen Hearing  Screening - Comments:: Denies hearing difficulties   Vision Screening - Comments:: Wears rx glasses - up to date with routine eye exams with [facility]  Dietary issues and exercise activities discussed: Current Exercise Habits: Home exercise routine, Type of exercise: walking, Time (Minutes): 20, Frequency (Times/Week): 7, Weekly Exercise (Minutes/Week): 140, Intensity: Mild, Exercise limited by: orthopedic condition(s)   Goals Addressed               This Visit's Progress     "I would like to increase my physical activity level" (pt-stated)   On track     Current Barriers:  Bilateral knee pain Obesity Covid 19 social distancing restrictions  Nurse Case Manager Clinical Goal(s):  Over the next 30 days, patient will work with [facility] to identify ways to increase physical activity level with consideration for chronic knee pain  Interventions:  RNCM will review ortho surgeons notes and recommendations after patient's visit on 01/19/2019 RNCM will collaborate with patient's care team regarding plan of care for knee pain and how to increase activity level appropriately   Patient Self Care Activities:  Performs ADL's independently Performs IADL's independently  Initial goal documentation       DIET - EAT MORE FRUITS AND VEGETABLES   On track      Depression Screen    10/28/2021    9:52 AM 09/03/2021    3:39 PM 09/03/2021    3:28 PM 04/23/2021   12:57 PM 04/23/2021   12:46 PM 12/20/2020    8:38 AM 10/30/2020   11:03 AM  PHQ 2/9 Scores  PHQ - 2 Score 2 2 0 4 0 2 2  PHQ- 9 Score '6 6  12  3 5    '$ Fall Risk    10/28/2021    9:49 AM 09/03/2021    3:28 PM  04/23/2021   12:46 PM 10/30/2020   10:33 AM 10/25/2020    3:42 PM  Fall Risk   Falls in the past year? 0 0 0 0 0  Number falls in past yr: 0    0  Injury with Fall? 0    0  Risk for fall due to : No Fall Risks      Follow up Falls prevention discussed        FALL RISK PREVENTION PERTAINING TO THE HOME:  Any stairs in or around the home? Yes  If so, are there any without handrails? No  Home free of loose throw rugs in walkways, pet beds, electrical cords, etc? Yes  Adequate lighting in your home to reduce risk of falls? Yes   ASSISTIVE DEVICES UTILIZED TO PREVENT FALLS:  Life alert? No  Use of a cane, walker or w/c? Yes  Grab bars in the bathroom? Yes  Shower chair or bench in shower? No  Elevated toilet seat or a handicapped toilet? No   TIMED UP AND GO:  Was the test performed? No . Telephonic visit   Cognitive Function:        10/28/2021    9:53 AM 10/25/2020   10:54 AM 10/25/2019   10:50 AM 09/27/2018   10:41 AM  6CIT Screen  What Year? 0 points 0 points 0 points 0 points  What month? 0 points 0 points 0 points 0 points  What time? 0 points 0 points 0 points 0 points  Count back from 20 0 points 0 points 0 points 0 points  Months in reverse 0 points 0 points 0 points 0 points  Repeat phrase  0 points 0 points 0 points 0 points  Total Score 0 points 0 points 0 points 0 points    Immunizations Immunization History  Administered Date(s) Administered   Fluad Quad(high Dose 65+) 05/02/2019, 01/31/2020, 04/23/2021   Influenza,inj,Quad PF,6+ Mos 06/20/2013, 02/11/2017, 02/09/2018   Influenza-Unspecified 07/26/2015   Moderna SARS-COV2 Booster Vaccination 03/14/2020   Moderna Sars-Covid-2 Vaccination 06/28/2019, 07/26/2019, 12/26/2020   Pneumococcal Conjugate-13 01/08/2016   Pneumococcal Polysaccharide-23 08/12/2017   Tdap 01/31/2020   Zoster Recombinat (Shingrix) 09/03/2021    TDAP status: Up to date  Flu Vaccine status: Up to date  Pneumococcal vaccine status:  Up to date  Covid-19 vaccine status: Completed vaccines  Qualifies for Shingles Vaccine? Yes   Zostavax completed Yes   Shingrix Completed?: No.    Education has been provided regarding the importance of this vaccine. Patient has been advised to call insurance company to determine out of pocket expense if they have not yet received this vaccine. Advised may also receive vaccine at local pharmacy or Health Dept. Verbalized acceptance and understanding.  Screening Tests Health Maintenance  Topic Date Due   COVID-19 Vaccine (4 - Moderna series) 02/20/2021   COLONOSCOPY (Pts 45-31yr Insurance coverage will need to be confirmed)  03/28/2021   Zoster Vaccines- Shingrix (2 of 2) 10/29/2021   INFLUENZA VACCINE  12/10/2021   TETANUS/TDAP  01/30/2030   Pneumonia Vaccine 75 Years old  Completed   Hepatitis C Screening  Completed   HPV VACCINES  Aged Out    Health Maintenance  Health Maintenance Due  Topic Date Due   COVID-19 Vaccine (4 - Moderna series) 02/20/2021   COLONOSCOPY (Pts 45-430yrInsurance coverage will need to be confirmed)  03/28/2021    Colorectal cancer screening: Referral to GI placed 10/28/2021. Pt aware the office will call re: appt.  Lung Cancer Screening: (Low Dose CT Chest recommended if Age 75-80ears, 30 pack-year currently smoking OR have quit w/in 15years.) does not qualify.   Additional Screening:  Hepatitis C Screening: does qualify; Completed 01/31/2020  Vision Screening: Recommended annual ophthalmology exams for early detection of glaucoma and other disorders of the eye. Is the patient up to date with their annual eye exam?  Yes  Who is the provider or what is the name of the office in which the patient attends annual eye exams? [facility] is not established with a provider, would they like to be referred to a provider to establish care? No .   Dental Screening: Recommended annual dental exams for proper oral hygiene  Community Resource  Referral / Chronic Care Management: CRR required this visit?  No   CCM required this visit?  No      Plan:     I have personally reviewed and noted the following in the patient's chart:   Medical and social history Use of alcohol, tobacco or illicit drugs  Current medications and supplements including opioid prescriptions. Patient is not currently taking opioid prescriptions. Functional ability and status Nutritional status Physical activity Advanced directives List of other physicians Hospitalizations, surgeries, and ER visits in previous 12 months Vitals Screenings to include cognitive, depression, and falls Referrals and appointments  In addition, I have reviewed and discussed with patient certain preventive protocols, quality metrics, and best practice recommendations. A written personalized care plan for preventive services as well as general preventive health recommendations were provided to patient.     AmSandrea HammondLPN   10/15/59/5379 Nurse Notes: None

## 2022-01-21 ENCOUNTER — Other Ambulatory Visit: Payer: Self-pay | Admitting: Family Medicine

## 2022-02-03 DIAGNOSIS — R82992 Hyperoxaluria: Secondary | ICD-10-CM | POA: Diagnosis not present

## 2022-02-03 DIAGNOSIS — N200 Calculus of kidney: Secondary | ICD-10-CM | POA: Diagnosis not present

## 2022-02-27 ENCOUNTER — Encounter: Payer: Self-pay | Admitting: Family Medicine

## 2022-02-27 ENCOUNTER — Ambulatory Visit (INDEPENDENT_AMBULATORY_CARE_PROVIDER_SITE_OTHER): Payer: Medicare HMO | Admitting: Family Medicine

## 2022-02-27 VITALS — BP 123/70 | HR 72 | Temp 98.2°F | Ht 70.0 in | Wt 333.0 lb

## 2022-02-27 DIAGNOSIS — Z23 Encounter for immunization: Secondary | ICD-10-CM

## 2022-02-27 DIAGNOSIS — I10 Essential (primary) hypertension: Secondary | ICD-10-CM

## 2022-02-27 DIAGNOSIS — E7800 Pure hypercholesterolemia, unspecified: Secondary | ICD-10-CM

## 2022-02-27 MED ORDER — ESCITALOPRAM OXALATE 20 MG PO TABS: 10.0000 mg | ORAL_TABLET | Freq: Every day | ORAL | 1 refills | Status: DC

## 2022-02-27 MED ORDER — AMLODIPINE BESYLATE 5 MG PO TABS: 5.0000 mg | ORAL_TABLET | Freq: Every morning | ORAL | 2 refills | Status: DC

## 2022-02-27 NOTE — Progress Notes (Signed)
Subjective:  Patient ID: Dennis Spencer, male    DOB: 02/16/1946  Age: 76 y.o. MRN: [id number]  CC: Medical Management of Chronic Issues   HPI Dennis Spencer presents for  in for follow-up of elevated cholesterol. Doing well without complaints on current medication. Denies side effects of statin including myalgia and arthralgia and nausea. Currently no chest pain, shortness of breath or other cardiovascular related symptoms noted.   presents for  follow-up of hypertension. Patient has no history of headache chest pain or shortness of breath or recent cough. Patient also denies symptoms of TIA such as focal numbness or weakness. Patient denies side effects from medication. States taking it regularly.      02/27/2022    4:08 PM 10/28/2021    9:52 AM 09/03/2021    3:39 PM 09/03/2021    3:28 PM 04/23/2021   12:57 PM  Depression screen PHQ 2/9  Decreased Interest _0 0 2  Down, Depressed, Hopeless _1 0 2  PHQ - 2 Score _2 0 4  Altered sleeping 0 0 0  0  Tired, decreased energy _3 Change in appetite 0 _4 Feeling bad or failure about yourself  0 _5 Trouble concentrating 1 0 0  1  Moving slowly or fidgety/restless 0 0 0  1  Suicidal thoughts 0 0 0  0  PHQ-9 Score _6 Difficult doing work/chores Somewhat difficult Somewhat difficult Somewhat difficult  Somewhat difficult       02/27/2022    4:08 PM 09/03/2021    3:40 PM 04/23/2021   12:57 PM 12/20/2020    8:39 AM  GAD 7 : Generalized Anxiety Score  Nervous, Anxious, on Edge _7 Control/stop worrying _8 Worry too much - different things _9 Trouble relaxing _10 Restless  0 0 0  Easily annoyed or irritable _11 Afraid - awful might happen _12 0  Total GAD 7 Score  _13 Anxiety Difficulty Somewhat difficult Somewhat difficult Somewhat difficult Somewhat difficult         02/27/2022    4:08 PM 10/28/2021    9:52 AM 09/03/2021    3:39 PM  Depression screen PHQ  2/9  Decreased Interest _14 Down, Depressed, Hopeless _15 PHQ - 2 Score _16 Altered sleeping 0 0 0  Tired, decreased energy _17 Change in appetite 0 1 1  Feeling bad or failure about yourself  0 1 1  Trouble concentrating 1 0 0  Moving slowly or fidgety/restless 0 0 0  Suicidal thoughts 0 0 0  PHQ-9 Score _18 Difficult doing work/chores Somewhat difficult Somewhat difficult Somewhat difficult    History Dennis Spencer has a past medical history of Anticoagulant long-term use, Arthritis, Carotid stenosis, bilateral, Chronic cough, CKD (chronic kidney disease), stage III ([place]) (11/10/2013), Dyspnea, GERD (gastroesophageal reflux disease), History of CVA (cerebrovascular accident) (04/13/2018), History of kidney stones, History of osteomyelitis (age 56), Hyperlipidemia, Hypertension, OSA (obstructive sleep apnea) (study in epic 09-25-2005  severe osa), 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   He has a past surgical history that includes Cystoscopy  with retrograde pyelogram, ureteroscopy and stent placement (Bilateral, 06/18/2013); Holmium laser application (Bilateral, 06/18/2013); Tracheostomy (1987); Nephrolithotomy (Left, 08/08/2013); Cystoscopy with retrograde pyelogram, ureteroscopy and stent placement (Right, 08/08/2013); Nephrolithotomy (Left, 08/10/2013); Cystoscopy with retrograde pyelogram, ureteroscopy and stent placement (Right, 08/10/2013); Holmium laser application (Left, 08/11/7406); Cholecystectomy (N/A, 06/15/2015); Colonoscopy (last one 03-28-2016); Cystoscopy with retrograde pyelogram, ureteroscopy and stent placement (Bilateral, 05/22/2017); Holmium laser application (Bilateral, 05/22/2017); Cystoscopy with retrograde pyelogram, ureteroscopy and stent placement (Bilateral, 06/05/2017); Cystoscopy/ureteroscopy/holmium laser/stent placement (Left, 06/26/2017); Cystoscopy w/  ureteral stent removal (Right, 06/26/2017); Tonsillectomy (child); 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 ); Percutaneous nephrostolithotomy (Left, 09-19-2009   dr Diona Fanti  _1 ); Ureteroscopy with holmium laser lithotripsy (Left, 04-14-2002   dr Diona Fanti  _2 ); Orchiopexy (child); Cystoscopy with retrograde pyelogram, ureteroscopy and stent placement (Bilateral, 08/13/2018); Holmium laser application (Bilateral, 08/13/2018); Wisdom tooth extraction; Cystoscopy with retrograde pyelogram, ureteroscopy and stent placement (Bilateral, 10/22/2018); Holmium laser application (Bilateral, 10/22/2018); Cystoscopy w/ ureteral stent removal (Bilateral, 10/22/2018); Cystoscopy with retrograde pyelogram, ureteroscopy and stent placement (Bilateral, 12/23/2019); Holmium laser application (Bilateral, 12/23/2019); Cystoscopy with litholapaxy (N/A, 12/23/2019); and Cystoscopy with retrograde pyelogram, ureteroscopy and stent placement (Bilateral, 01/06/2020).   His family history includes Breast cancer in his maternal grandmother; Cancer in his mother; Colon cancer in his maternal grandfather; Diabetes in his father; Heart failure in his father; Hypertension in his father.He reports that he quit smoking about 53 years ago. His smoking use included cigarettes. He has a 8.00 pack-year smoking history. He has never used smokeless tobacco. He reports that he does not currently use alcohol. He reports that he does not use drugs.    ROS Review of Systems  Constitutional: Negative.  Negative for fever.  HENT: Negative.    Eyes:  Negative for visual disturbance.  Respiratory:  Negative for cough and shortness of breath.   Cardiovascular:  Negative for chest pain and leg swelling.  Gastrointestinal:  Negative for abdominal pain, diarrhea, nausea and vomiting.  Genitourinary:  Negative for difficulty urinating.  Musculoskeletal:  Negative for arthralgias and myalgias.  Skin:  Negative for rash.  Neurological:  Negative for headaches.   Psychiatric/Behavioral:  Negative for sleep disturbance.     Objective:  BP 123/70   Pulse 72   Temp 98.2 F (36.8 C)   Ht _3  (1.778 m)   Wt (!) 333 lb (151 kg)   SpO2 95%   BMI 47.78 kg/m   BP Readings from Last 3 Encounters:  02/27/22 123/70  09/03/21 (!) 149/64  04/23/21 139/63    Wt Readings from Last 3 Encounters:  02/27/22 (!) 333 lb (151 kg)  10/28/21 (!) 322 lb (146.1 kg)  09/03/21 (!) 334 lb 6.4 oz (151.7 kg)     Physical Exam Vitals reviewed.  Constitutional:      Appearance: He is well-developed.  HENT:     Head: Normocephalic and atraumatic.     Right Ear: External ear normal.     Left Ear: External ear normal.     Mouth/Throat:     Pharynx: No oropharyngeal exudate or posterior oropharyngeal erythema.  Eyes:     Pupils: Pupils are equal, round, and reactive to light.  Cardiovascular:     Rate and Rhythm: Normal rate and regular rhythm.     Heart sounds: No murmur heard. Pulmonary:     Effort: No respiratory distress.     Breath sounds: Normal breath sounds.  Musculoskeletal:     Cervical back: Normal range of motion and neck supple.  Neurological:  Mental Status: He is alert and oriented to person, place, and time.       Assessment & Plan:   Kaston was seen today for medical management of chronic issues.  Diagnoses and all orders for this visit:  Need for zoster vaccination -     Zoster Recombinant (Shingrix )  Hypercholesteremia -     Lipid panel  Essential hypertension -     CBC with Differential/Platelet -     CMP14+EGFR -     amLODipine (NORVASC) 5 MG tablet; Take 1 tablet (5 mg total) by mouth in the morning.  Need for immunization against influenza -     Flu Vaccine QUAD High Dose(Fluad)  Other orders -     Discontinue: escitalopram (LEXAPRO) 20 MG tablet; Take 0.5 tablets (10 mg total) by mouth daily.       I have discontinued [name] "Steve"'s Ozempic (0.25 or 0.5 MG/DOSE) and escitalopram. I am  also having him maintain his ascorbic acid, Vitamin D3, multivitamin with minerals, b complex vitamins, Calcium Citrate-Vitamin D (CITRACAL + D PO), atorvastatin, gabapentin, pantoprazole, ticagrelor, and amLODipine.  Allergies as of 02/27/2022       Reactions   Lisinopril Other (See Comments)   DIZZINESS        Medication List        Accurate as of February 27, 2022 11:59 PM. If you have any questions, ask your nurse or doctor.          STOP taking these medications    Ozempic (0.25 or 0.5 MG/DOSE) 2 MG/1.5ML Sopn Generic drug: Semaglutide(0.25 or 0.5MG/DOS) Stopped by: Claretta Fraise, MD       TAKE these medications    amLODipine 5 MG tablet Commonly known as: NORVASC Take 1 tablet (5 mg total) by mouth in the morning.   ascorbic acid 500 MG tablet Commonly known as: VITAMIN C Take 500 mg by mouth 2 (two) times daily.   atorvastatin 80 MG tablet Commonly known as: LIPITOR Take 1 tablet (80 mg total) by mouth daily.   b complex vitamins tablet Take 1 tablet by mouth daily.   CITRACAL + D PO Take 1 tablet by mouth 3 (three) times daily.   escitalopram 20 MG tablet Commonly known as: LEXAPRO Take 0.5 tablets (10 mg total) by mouth daily. What changed: medication strength Changed by: Claretta Fraise, MD   gabapentin 600 MG tablet Commonly known as: NEURONTIN Take 2 tablets (1,200 mg total) by mouth daily.   multivitamin with minerals Tabs tablet Take 1 tablet by mouth daily.   pantoprazole 40 MG tablet Commonly known as: PROTONIX Take 1 tablet (40 mg total) by mouth daily. For stomach   ticagrelor 60 MG Tabs tablet Commonly known as: Brilinta Take 1 tablet (60 mg total) by mouth 2 (two) times daily.   Vitamin D3 50 MCG (2000 UT) capsule Take 2,000 Units by mouth 2 (two) times daily.         Follow-up: Return in about 6 months (around 08/29/2022).  Claretta Fraise, M.D.

## 2022-02-28 ENCOUNTER — Other Ambulatory Visit: Payer: Self-pay | Admitting: Family Medicine

## 2022-02-28 LAB — LIPID PANEL
Chol/HDL Ratio: 3.1 ratio (ref 0.0–5.0)
Cholesterol, Total: 131 mg/dL (ref 100–199)
HDL: 42 mg/dL (ref >39)
LDL Chol Calc (NIH): 69 mg/dL (ref 0–99)
Triglycerides: 112 mg/dL (ref 0–149)
VLDL Cholesterol Cal: 20 mg/dL (ref 5–40)

## 2022-02-28 LAB — CMP14+EGFR
ALT: 18 IU/L (ref 0–44)
AST: 27 IU/L (ref 0–40)
Albumin/Globulin Ratio: 1.6 (ref 1.2–2.2)
Albumin: 3.9 g/dL (ref 3.8–4.8)
Alkaline Phosphatase: 92 IU/L (ref 44–121)
BUN/Creatinine Ratio: 10 (ref 10–24)
BUN: 13 mg/dL (ref 8–27)
Bilirubin Total: 0.4 mg/dL (ref 0.0–1.2)
CO2: 22 mmol/L (ref 20–29)
Calcium: 8.9 mg/dL (ref 8.6–10.2)
Chloride: 105 mmol/L (ref 96–106)
Creatinine, Ser: 1.31 mg/dL — ABNORMAL HIGH (ref 0.76–1.27)
Globulin, Total: 2.4 g/dL (ref 1.5–4.5)
Glucose: 129 mg/dL — ABNORMAL HIGH (ref 70–99)
Potassium: 4.8 mmol/L (ref 3.5–5.2)
Sodium: 141 mmol/L (ref 134–144)
Total Protein: 6.3 g/dL (ref 6.0–8.5)
eGFR: 57 mL/min/{1.73_m2} — ABNORMAL LOW (ref >59)

## 2022-02-28 LAB — CBC WITH DIFFERENTIAL/PLATELET
Basophils Absolute: 0.1 10*3/uL (ref 0.0–0.2)
Basos: 1 %
EOS (ABSOLUTE): 0.2 10*3/uL (ref 0.0–0.4)
Eos: 4 %
Hematocrit: 37.4 % — ABNORMAL LOW (ref 37.5–51.0)
Hemoglobin: 11.9 g/dL — ABNORMAL LOW (ref 13.0–17.7)
Immature Grans (Abs): 0 10*3/uL (ref 0.0–0.1)
Immature Granulocytes: 0 %
Lymphocytes Absolute: 1.5 10*3/uL (ref 0.7–3.1)
Lymphs: 29 %
MCH: 27.5 pg (ref 26.6–33.0)
MCHC: 31.8 g/dL (ref 31.5–35.7)
MCV: 86 fL (ref 79–97)
Monocytes Absolute: 0.4 10*3/uL (ref 0.1–0.9)
Monocytes: 9 %
Neutrophils Absolute: 2.9 10*3/uL (ref 1.4–7.0)
Neutrophils: 57 %
Platelets: 262 10*3/uL (ref 150–450)
RBC: 4.33 x10E6/uL (ref 4.14–5.80)
RDW: 14.9 % (ref 11.6–15.4)
WBC: 5 10*3/uL (ref 3.4–10.8)

## 2022-03-01 NOTE — Progress Notes (Signed)
Hello Ember,  Your lab result is normal and/or stable.Some minor variations that are not significant are commonly marked abnormal, but do not represent any medical problem for you.  Best regards, Damyiah Moxley, M.D.

## 2022-03-02 ENCOUNTER — Encounter: Payer: Self-pay | Admitting: Family Medicine

## 2022-03-03 ENCOUNTER — Encounter: Payer: Self-pay | Admitting: Family Medicine

## 2022-03-04 ENCOUNTER — Other Ambulatory Visit: Payer: Self-pay | Admitting: Family Medicine

## 2022-03-04 MED ORDER — ESCITALOPRAM OXALATE 20 MG PO TABS: 20.0000 mg | ORAL_TABLET | Freq: Every day | ORAL | 1 refills | Status: DC

## 2022-04-10 ENCOUNTER — Ambulatory Visit (INDEPENDENT_AMBULATORY_CARE_PROVIDER_SITE_OTHER): Payer: Medicare HMO | Admitting: Family Medicine

## 2022-04-10 ENCOUNTER — Encounter: Payer: Self-pay | Admitting: Family Medicine

## 2022-04-10 DIAGNOSIS — M25562 Pain in left knee: Secondary | ICD-10-CM

## 2022-04-10 DIAGNOSIS — M25561 Pain in right knee: Secondary | ICD-10-CM | POA: Diagnosis not present

## 2022-04-10 DIAGNOSIS — I10 Essential (primary) hypertension: Secondary | ICD-10-CM

## 2022-04-10 DIAGNOSIS — G8929 Other chronic pain: Secondary | ICD-10-CM

## 2022-04-10 NOTE — Progress Notes (Signed)
Subjective:  Patient ID: Dennis Spencer, male    DOB: 1945/11/05  Age: 76 y.o. MRN: [id number]  CC: Follow-up   HPI Dennis Spencer presents for ozempic use. Weight down 9 lb in 5 weeks of use. Was able to get it thorugh pt. Assistance program. Has some diarrhea and indigestion. Went up to 0.5 with most recent dose. No additional  adverse effects. Lexapro increase did cause dry mouth.    04/10/2022    3:22 PM 02/27/2022    4:08 PM 09/03/2021    3:40 PM 04/23/2021   12:57 PM  GAD 7 : Generalized Anxiety Score  Nervous, Anxious, on Edge '1 1 1 2  '$ Control/stop worrying '1 1 1 2  '$ Worry too much - different things '1 2 2 3  '$ Trouble relaxing 0 '1 1 1  '$ Restless 0  0 0  Easily annoyed or irritable '1 1 2 3  '$ Afraid - awful might happen '1 1 2 2  '$ Total GAD 7 Score '5  9 13  '$ Anxiety Difficulty Somewhat difficult Somewhat difficult Somewhat difficult Somewhat difficult     History Dennis Spencer has a past medical history of Anticoagulant long-term use, Arthritis, Carotid stenosis, bilateral, Chronic cough, CKD (chronic kidney disease), stage III ([place]) (11/10/2013), Dyspnea, GERD (gastroesophageal reflux disease), History of CVA (cerebrovascular accident) (04/13/2018), History of kidney stones, History of osteomyelitis (age 84), Hyperlipidemia, Hypertension, OSA (obstructive sleep apnea) (study in epic 09-25-2005  severe osa), 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   Dennis Spencer has a past surgical history that includes Cystoscopy with retrograde pyelogram, ureteroscopy and stent placement (Bilateral, 06/18/2013); Holmium laser application (Bilateral, 06/18/2013); Tracheostomy (1987); Nephrolithotomy (Left, 08/08/2013); Cystoscopy with retrograde pyelogram, ureteroscopy and stent placement (Right, 08/08/2013); Nephrolithotomy (Left, 08/10/2013); Cystoscopy with retrograde pyelogram, ureteroscopy and stent  placement (Right, 08/10/2013); Holmium laser application (Left, 10/13/1495); Cholecystectomy (N/A, 06/15/2015); Colonoscopy (last one 03-28-2016); Cystoscopy with retrograde pyelogram, ureteroscopy and stent placement (Bilateral, 05/22/2017); Holmium laser application (Bilateral, 05/22/2017); Cystoscopy with retrograde pyelogram, ureteroscopy and stent placement (Bilateral, 06/05/2017); Cystoscopy/ureteroscopy/holmium laser/stent placement (Left, 06/26/2017); Cystoscopy w/ ureteral stent removal (Right, 06/26/2017); Tonsillectomy (child); Gastric Roux-En-Y (2004    '@[facility]'$ ); Percutaneous nephrostolithotomy (Left, 09-19-2009   dr Diona Fanti  '@[facility]'$ ); Ureteroscopy with holmium laser lithotripsy (Left, 04-14-2002   dr Diona Fanti  '@[facility]'$ ); Orchiopexy (child); Cystoscopy with retrograde pyelogram, ureteroscopy and stent placement (Bilateral, 08/13/2018); Holmium laser application (Bilateral, 08/13/2018); Wisdom tooth extraction; Cystoscopy with retrograde pyelogram, ureteroscopy and stent placement (Bilateral, 10/22/2018); Holmium laser application (Bilateral, 10/22/2018); Cystoscopy w/ ureteral stent removal (Bilateral, 10/22/2018); Cystoscopy with retrograde pyelogram, ureteroscopy and stent placement (Bilateral, 12/23/2019); Holmium laser application (Bilateral, 12/23/2019); Cystoscopy with litholapaxy (N/A, 12/23/2019); and Cystoscopy with retrograde pyelogram, ureteroscopy and stent placement (Bilateral, 01/06/2020).   His family history includes Breast cancer in his maternal grandmother; Cancer in his mother; Colon cancer in his maternal grandfather; Diabetes in his father; Heart failure in his father; Hypertension in his father.Dennis Spencer reports that Dennis Spencer quit smoking about 53 years ago. His smoking use included cigarettes. Dennis Spencer has a 8.00 pack-year smoking history. Dennis Spencer has never used smokeless tobacco. Dennis Spencer reports that Dennis Spencer does not currently use alcohol. Dennis Spencer reports that Dennis Spencer does not use drugs.    ROS Review of Systems  Constitutional:  Positive for  appetite change. Negative for activity change.  HENT: Negative.    Respiratory: Negative.    Cardiovascular: Negative.   Gastrointestinal: Negative.     Objective:  BP 132/63   Pulse 76   Temp (!) 97.5 F (36.4 C)   Ht '5\' 10"'$  (1.778 m)   Wt (!) 323 lb 3.2 oz (146.6 kg)   SpO2 96%   BMI 46.37 kg/m   BP Readings from Last 3 Encounters:  04/10/22 132/63  02/27/22 123/70  09/03/21 (!) 149/64    Wt Readings from Last 3 Encounters:  04/10/22 (!) 323 lb 3.2 oz (146.6 kg)  02/27/22 (!) 333 lb (151 kg)  10/28/21 (!) 322 lb (146.1 kg)     Physical Exam Vitals reviewed.  Constitutional:      Appearance: Dennis Spencer is well-developed.  HENT:     Head: Normocephalic and atraumatic.     Right Ear: External ear normal.     Left Ear: External ear normal.     Mouth/Throat:     Pharynx: No oropharyngeal exudate or posterior oropharyngeal erythema.  Eyes:     Pupils: Pupils are equal, round, and reactive to light.  Cardiovascular:     Rate and Rhythm: Normal rate and regular rhythm.     Heart sounds: No murmur heard. Pulmonary:     Effort: No respiratory distress.     Breath sounds: Normal breath sounds.  Musculoskeletal:     Cervical back: Normal range of motion and neck supple.  Neurological:     Mental Status: Dennis Spencer is alert and oriented to person, place, and time.       Assessment & Plan:   Dennis Spencer was seen today for follow-up.  Diagnoses and all orders for this visit:  Morbid obesity ([place])  Bilateral chronic knee pain  Primary hypertension       I am having Dennis Portela "Richardson Landry" maintain his ascorbic acid, Vitamin D3, multivitamin with minerals, b complex vitamins, Calcium Citrate-Vitamin D (CITRACAL + D PO), atorvastatin, gabapentin, pantoprazole, ticagrelor, amLODipine, and escitalopram.  Allergies as of 04/10/2022       Reactions   Lisinopril Other (See Comments)   DIZZINESS        Medication List        Accurate as of April 10, 2022 11:59 PM.  If you have any questions, ask your nurse or doctor.          amLODipine 5 MG tablet Commonly known as: NORVASC Take 1 tablet (5 mg total) by mouth in the morning.   ascorbic acid 500 MG tablet Commonly known as: VITAMIN C Take 500 mg by mouth 2 (two) times daily.   atorvastatin 80 MG tablet Commonly known as: LIPITOR Take 1 tablet (80 mg total) by mouth daily.   b complex vitamins tablet Take 1 tablet by mouth daily.   CITRACAL + D PO Take 1 tablet by mouth 3 (three) times daily.   escitalopram 20 MG tablet Commonly known as: LEXAPRO Take 1 tablet (20 mg total) by mouth daily.   gabapentin 600 MG tablet Commonly known as: NEURONTIN Take 2 tablets (1,200 mg total) by mouth daily.   multivitamin with minerals Tabs tablet Take 1 tablet by mouth daily.   pantoprazole 40 MG tablet Commonly known as: PROTONIX Take 1 tablet (40 mg total) by mouth daily. For stomach   ticagrelor 60 MG Tabs tablet Commonly known as: Brilinta Take 1 tablet (60 mg total) by mouth 2 (two) times daily.   Vitamin D3 50 MCG (2000 UT) capsule Take 2,000 Units by mouth 2 (two) times daily.         Follow-up: Return in about 3 months (around 07/10/2022).  Claretta Fraise, M.D.

## 2022-04-11 ENCOUNTER — Telehealth: Payer: Self-pay | Admitting: Pharmacist

## 2022-04-11 NOTE — Telephone Encounter (Signed)
Spoke with patient  Ozempic 0.'5mg'$  weekly in fridge for pick up Added back to medication list

## 2022-05-08 ENCOUNTER — Other Ambulatory Visit: Payer: Self-pay | Admitting: Family Medicine

## 2022-07-08 ENCOUNTER — Ambulatory Visit: Payer: Medicare HMO | Admitting: Family Medicine

## 2022-07-08 ENCOUNTER — Encounter: Payer: Self-pay | Admitting: Family Medicine

## 2022-07-17 ENCOUNTER — Other Ambulatory Visit: Payer: Self-pay | Admitting: Family Medicine

## 2022-07-17 ENCOUNTER — Encounter: Payer: Self-pay | Admitting: Family Medicine

## 2022-07-17 ENCOUNTER — Ambulatory Visit (INDEPENDENT_AMBULATORY_CARE_PROVIDER_SITE_OTHER): Payer: Medicare HMO | Admitting: Family Medicine

## 2022-07-17 VITALS — BP 130/62 | HR 83 | Temp 97.5°F | Ht 70.0 in | Wt 309.2 lb

## 2022-07-17 DIAGNOSIS — G8929 Other chronic pain: Secondary | ICD-10-CM | POA: Diagnosis not present

## 2022-07-17 DIAGNOSIS — M25561 Pain in right knee: Secondary | ICD-10-CM

## 2022-07-17 DIAGNOSIS — M25562 Pain in left knee: Secondary | ICD-10-CM

## 2022-07-17 DIAGNOSIS — E7800 Pure hypercholesterolemia, unspecified: Secondary | ICD-10-CM

## 2022-07-17 DIAGNOSIS — K219 Gastro-esophageal reflux disease without esophagitis: Secondary | ICD-10-CM

## 2022-07-17 DIAGNOSIS — I10 Essential (primary) hypertension: Secondary | ICD-10-CM

## 2022-07-17 DIAGNOSIS — Z6841 Body Mass Index (BMI) 40.0 and over, adult: Secondary | ICD-10-CM | POA: Diagnosis not present

## 2022-07-17 DIAGNOSIS — Z125 Encounter for screening for malignant neoplasm of prostate: Secondary | ICD-10-CM | POA: Diagnosis not present

## 2022-07-17 MED ORDER — ESCITALOPRAM OXALATE 20 MG PO TABS: 20.0000 mg | ORAL_TABLET | Freq: Every day | ORAL | 3 refills | Status: DC

## 2022-07-17 MED ORDER — GABAPENTIN 600 MG PO TABS: 1200.0000 mg | ORAL_TABLET | Freq: Every day | ORAL | 3 refills | Status: DC

## 2022-07-17 MED ORDER — TICAGRELOR 60 MG PO TABS: 60.0000 mg | ORAL_TABLET | Freq: Two times a day (BID) | ORAL | 3 refills | Status: DC

## 2022-07-17 MED ORDER — PANTOPRAZOLE SODIUM 40 MG PO TBEC: 40.0000 mg | DELAYED_RELEASE_TABLET | Freq: Every day | ORAL | 3 refills | Status: DC

## 2022-07-17 MED ORDER — OZEMPIC (1 MG/DOSE) 4 MG/3ML ~~LOC~~ SOPN: 1.0000 mg | PEN_INJECTOR | SUBCUTANEOUS | 5 refills | Status: DC

## 2022-07-17 NOTE — Progress Notes (Signed)
Subjective:  Patient ID: Dennis Spencer, male    DOB: 09-04-1945  Age: 77 y.o. MRN: [id number]  CC: Medical Management of Chronic Issues   HPI ZAKK KAEMMERER presents for  presents for  follow-up of hypertension. Patient has no history of headache chest pain or shortness of breath or recent cough. Patient also denies symptoms of TIA such as focal numbness or weakness. Patient denies side effects from medication. States taking it regularly.   in for follow-up of elevated cholesterol. Doing well without complaints on current medication. Denies side effects of statin including myalgia and arthralgia and nausea. Currently no chest pain, shortness of breath or other cardiovascular related symptoms noted.       07/17/2022    3:33 PM 07/17/2022    3:23 PM 04/10/2022    3:22 PM  Depression screen PHQ 2/9  Decreased Interest 1 0 1  Down, Depressed, Hopeless 1 0 1  PHQ - 2 Score 2 0 2  Altered sleeping 0  0  Tired, decreased energy 2  2  Change in appetite 1  0  Feeling bad or failure about yourself  1  0  Trouble concentrating 1  0  Moving slowly or fidgety/restless 2  1  Suicidal thoughts 0  0  PHQ-9 Score 9  5  Difficult doing work/chores Somewhat difficult  Somewhat difficult    History Anatoli has a past medical history of Anticoagulant long-term use, Arthritis, Carotid stenosis, bilateral, Chronic cough, CKD (chronic kidney disease), stage III ([place]) (11/10/2013), Dyspnea, GERD (gastroesophageal reflux disease), History of CVA (cerebrovascular accident) (04/13/2018), History of kidney stones, History of osteomyelitis (age 12), Hyperlipidemia, Hypertension, OSA (obstructive sleep apnea) (study in epic 09-25-2005  severe osa), 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   He has a past surgical history that includes Cystoscopy with retrograde pyelogram, ureteroscopy and stent  placement (Bilateral, 06/18/2013); Holmium laser application (Bilateral, 06/18/2013); Tracheostomy (1987); Nephrolithotomy (Left, 08/08/2013); Cystoscopy with retrograde pyelogram, ureteroscopy and stent placement (Right, 08/08/2013); Nephrolithotomy (Left, 08/10/2013); Cystoscopy with retrograde pyelogram, ureteroscopy and stent placement (Right, 08/10/2013); Holmium laser application (Left, [id number]); Cholecystectomy (N/A, 06/15/2015); Colonoscopy (last one 03-28-2016); Cystoscopy with retrograde pyelogram, ureteroscopy and stent placement (Bilateral, 05/22/2017); Holmium laser application (Bilateral, 05/22/2017); Cystoscopy with retrograde pyelogram, ureteroscopy and stent placement (Bilateral, 06/05/2017); Cystoscopy/ureteroscopy/holmium laser/stent placement (Left, 06/26/2017); Cystoscopy w/ ureteral stent removal (Right, 06/26/2017); Tonsillectomy (child); Gastric Roux-En-Y (2004    '@[facility]'$ ); Percutaneous nephrostolithotomy (Left, 09-19-2009   dr Diona Fanti  '@[facility]'$ ); Ureteroscopy with holmium laser lithotripsy (Left, 04-14-2002   dr Diona Fanti  '@[facility]'$ ); Orchiopexy (child); Cystoscopy with retrograde pyelogram, ureteroscopy and stent placement (Bilateral, 08/13/2018); Holmium laser application (Bilateral, 08/13/2018); Wisdom tooth extraction; Cystoscopy with retrograde pyelogram, ureteroscopy and stent placement (Bilateral, 10/22/2018); Holmium laser application (Bilateral, 10/22/2018); Cystoscopy w/ ureteral stent removal (Bilateral, 10/22/2018); Cystoscopy with retrograde pyelogram, ureteroscopy and stent placement (Bilateral, 12/23/2019); Holmium laser application (Bilateral, 12/23/2019); Cystoscopy with litholapaxy (N/A, 12/23/2019); and Cystoscopy with retrograde pyelogram, ureteroscopy and stent placement (Bilateral, 01/06/2020).   His family history includes Breast cancer in his maternal grandmother; Cancer in his mother; Colon cancer in his maternal grandfather; Diabetes in his father; Heart failure in his father; Hypertension in his  father.He reports that he quit smoking about 54 years ago. His smoking use included cigarettes. He has a 8.00 pack-year smoking history. He has never used smokeless tobacco. He reports that he does not currently use alcohol. He reports  that he does not use drugs.    ROS Review of Systems  Constitutional:  Negative for fever.  Respiratory:  Negative for shortness of breath.   Cardiovascular:  Negative for chest pain.  Musculoskeletal:  Negative for arthralgias.  Skin:  Negative for rash.    Objective:  BP 130/62   Pulse 83   Temp (!) 97.5 F (36.4 C)   Ht '5\' 10"'$  (1.778 m)   Wt (!) 309 lb 3.2 oz (140.3 kg)   SpO2 96%   BMI 44.37 kg/m   BP Readings from Last 3 Encounters:  07/17/22 130/62  04/10/22 132/63  02/27/22 123/70    Wt Readings from Last 3 Encounters:  07/17/22 (!) 309 lb 3.2 oz (140.3 kg)  04/10/22 (!) 323 lb 3.2 oz (146.6 kg)  02/27/22 (!) 333 lb (151 kg)     Physical Exam Vitals reviewed.  Constitutional:      Appearance: He is well-developed. He is obese.  HENT:     Head: Normocephalic and atraumatic.     Right Ear: External ear normal.     Left Ear: External ear normal.     Mouth/Throat:     Pharynx: No oropharyngeal exudate or posterior oropharyngeal erythema.  Eyes:     Pupils: Pupils are equal, round, and reactive to light.  Cardiovascular:     Rate and Rhythm: Normal rate and regular rhythm.     Heart sounds: No murmur heard. Pulmonary:     Effort: No respiratory distress.     Breath sounds: Normal breath sounds.  Musculoskeletal:     Cervical back: Normal range of motion and neck supple.  Neurological:     Mental Status: He is alert and oriented to person, place, and time.       Assessment & Plan:   Elry was seen today for medical management of chronic issues.  Diagnoses and all orders for this visit:  Primary hypertension -     CBC with Differential/Platelet -     CMP14+EGFR  Hypercholesteremia -     Lipid  panel  Prostate cancer screening -     PSA, total and free  Bilateral chronic knee pain -     gabapentin (NEURONTIN) 600 MG tablet; Take 2 tablets (1,200 mg total) by mouth daily.  Morbid obesity (HCC) -     Semaglutide, 1 MG/DOSE, (OZEMPIC, 1 MG/DOSE,) 4 MG/3ML SOPN; Inject 1 mg into the skin once a week.  Gastroesophageal reflux disease without esophagitis -     pantoprazole (PROTONIX) 40 MG tablet; Take 1 tablet (40 mg total) by mouth daily. For stomach  Other orders -     escitalopram (LEXAPRO) 20 MG tablet; Take 1 tablet (20 mg total) by mouth daily. -     ticagrelor (BRILINTA) 60 MG TABS tablet; Take 1 tablet (60 mg total) by mouth 2 (two) times daily.       I have discontinued [name] "Steve"'s Ozempic (0.25 or 0.5 MG/DOSE). I have also changed his escitalopram. Additionally, I am having him start on Ozempic (1 MG/DOSE). Lastly, I am having him maintain his ascorbic acid, Vitamin D3, multivitamin with minerals, b complex vitamins, Calcium Citrate-Vitamin D (CITRACAL + D PO), atorvastatin, amLODipine, gabapentin, pantoprazole, and ticagrelor.  Allergies as of 07/17/2022       Reactions   Lisinopril Other (See Comments)   DIZZINESS        Medication List        Accurate as of July 17, 2022 11:59 PM.  If you have any questions, ask your nurse or doctor.          STOP taking these medications    Ozempic (0.25 or 0.5 MG/DOSE) 2 MG/1.5ML Sopn Generic drug: Semaglutide(0.25 or 0.'5MG'$ /DOS) Replaced by: Ozempic (1 MG/DOSE) 4 MG/3ML Sopn Stopped by: Claretta Fraise, MD       TAKE these medications    amLODipine 5 MG tablet Commonly known as: NORVASC Take 1 tablet (5 mg total) by mouth in the morning.   ascorbic acid 500 MG tablet Commonly known as: VITAMIN C Take 500 mg by mouth 2 (two) times daily.   atorvastatin 80 MG tablet Commonly known as: LIPITOR Take 1 tablet (80 mg total) by mouth daily.   b complex vitamins tablet Take 1 tablet by mouth  daily.   CITRACAL + D PO Take 1 tablet by mouth 3 (three) times daily.   escitalopram 20 MG tablet Commonly known as: LEXAPRO Take 1 tablet (20 mg total) by mouth daily.   gabapentin 600 MG tablet Commonly known as: NEURONTIN Take 2 tablets (1,200 mg total) by mouth daily.   multivitamin with minerals Tabs tablet Take 1 tablet by mouth daily.   Ozempic (1 MG/DOSE) 4 MG/3ML Sopn Generic drug: Semaglutide (1 MG/DOSE) Inject 1 mg into the skin once a week. Replaces: Ozempic (0.25 or 0.5 MG/DOSE) 2 MG/1.5ML Sopn Started by: Claretta Fraise, MD   pantoprazole 40 MG tablet Commonly known as: PROTONIX Take 1 tablet (40 mg total) by mouth daily. For stomach   ticagrelor 60 MG Tabs tablet Commonly known as: Brilinta Take 1 tablet (60 mg total) by mouth 2 (two) times daily.   Vitamin D3 50 MCG (2000 UT) Caps Generic drug: Cholecalciferol Take 2,000 Units by mouth 2 (two) times daily.         Follow-up: Return in about 3 months (around 10/17/2022).  Claretta Fraise, M.D.

## 2022-07-18 NOTE — Telephone Encounter (Signed)
  Name from pharmacy: [facility] 4 MG/3 ML (1 MG/DOSE)   Pharmacy comment: Alternative Requested:NEED DIAGNOSIS CODE.

## 2022-07-20 ENCOUNTER — Encounter: Payer: Self-pay | Admitting: Family Medicine

## 2022-07-21 ENCOUNTER — Other Ambulatory Visit: Payer: Self-pay | Admitting: Family Medicine

## 2022-07-21 NOTE — Telephone Encounter (Signed)
Name from pharmacy: [facility] 4 MG/3 ML (1 MG/DOSE)   Pharmacy comment: Alternative Requested:DIAGNOSIS CODE NEEDED.

## 2022-07-29 ENCOUNTER — Other Ambulatory Visit: Payer: Medicare HMO

## 2022-07-29 DIAGNOSIS — I10 Essential (primary) hypertension: Secondary | ICD-10-CM | POA: Diagnosis not present

## 2022-07-29 DIAGNOSIS — Z125 Encounter for screening for malignant neoplasm of prostate: Secondary | ICD-10-CM | POA: Diagnosis not present

## 2022-07-29 DIAGNOSIS — E7800 Pure hypercholesterolemia, unspecified: Secondary | ICD-10-CM | POA: Diagnosis not present

## 2022-07-29 LAB — LIPID PANEL

## 2022-07-30 LAB — CBC WITH DIFFERENTIAL/PLATELET
Basophils Absolute: 0.1 10*3/uL (ref 0.0–0.2)
Basos: 1 %
EOS (ABSOLUTE): 0.3 10*3/uL (ref 0.0–0.4)
Eos: 4 %
Hematocrit: 40.5 % (ref 37.5–51.0)
Hemoglobin: 13.4 g/dL (ref 13.0–17.7)
Immature Grans (Abs): 0 10*3/uL (ref 0.0–0.1)
Immature Granulocytes: 0 %
Lymphocytes Absolute: 2 10*3/uL (ref 0.7–3.1)
Lymphs: 33 %
MCH: 29.7 pg (ref 26.6–33.0)
MCHC: 33.1 g/dL (ref 31.5–35.7)
MCV: 90 fL (ref 79–97)
Monocytes Absolute: 0.6 10*3/uL (ref 0.1–0.9)
Monocytes: 10 %
Neutrophils Absolute: 3.1 10*3/uL (ref 1.4–7.0)
Neutrophils: 52 %
Platelets: 249 10*3/uL (ref 150–450)
RBC: 4.51 x10E6/uL (ref 4.14–5.80)
RDW: 14.2 % (ref 11.6–15.4)
WBC: 5.9 10*3/uL (ref 3.4–10.8)

## 2022-07-30 LAB — CMP14+EGFR
ALT: 11 IU/L (ref 0–44)
AST: 22 IU/L (ref 0–40)
Albumin/Globulin Ratio: 1.7 (ref 1.2–2.2)
Albumin: 3.9 g/dL (ref 3.8–4.8)
Alkaline Phosphatase: 98 IU/L (ref 44–121)
BUN/Creatinine Ratio: 10 (ref 10–24)
BUN: 15 mg/dL (ref 8–27)
Bilirubin Total: 0.7 mg/dL (ref 0.0–1.2)
CO2: 20 mmol/L (ref 20–29)
Calcium: 9.5 mg/dL (ref 8.6–10.2)
Chloride: 104 mmol/L (ref 96–106)
Creatinine, Ser: 1.47 mg/dL — ABNORMAL HIGH (ref 0.76–1.27)
Globulin, Total: 2.3 g/dL (ref 1.5–4.5)
Glucose: 104 mg/dL — ABNORMAL HIGH (ref 70–99)
Potassium: 4.5 mmol/L (ref 3.5–5.2)
Sodium: 140 mmol/L (ref 134–144)
Total Protein: 6.2 g/dL (ref 6.0–8.5)
eGFR: 49 mL/min/{1.73_m2} — ABNORMAL LOW (ref >59)

## 2022-07-30 LAB — PSA, TOTAL AND FREE
PSA, Free Pct: 38.6 %
PSA, Free: 0.27 ng/mL
Prostate Specific Ag, Serum: 0.7 ng/mL (ref 0.0–4.0)

## 2022-07-30 LAB — LIPID PANEL
Chol/HDL Ratio: 2.5 ratio (ref 0.0–5.0)
Cholesterol, Total: 117 mg/dL (ref 100–199)
HDL: 46 mg/dL (ref >39)
LDL Chol Calc (NIH): 51 mg/dL (ref 0–99)
Triglycerides: 108 mg/dL (ref 0–149)
VLDL Cholesterol Cal: 20 mg/dL (ref 5–40)

## 2022-07-30 NOTE — Progress Notes (Signed)
Hello Wassim,  Your lab result is normal and/or stable.Some minor variations that are not significant are commonly marked abnormal, but do not represent any medical problem for you.  Best regards, Arturo Freundlich, M.D.

## 2022-08-18 ENCOUNTER — Telehealth: Payer: Self-pay | Admitting: Family Medicine

## 2022-08-19 NOTE — Telephone Encounter (Signed)
Can you check on patient assistance

## 2022-08-19 NOTE — Telephone Encounter (Signed)
Submitted application for OZEMPIC to [employer] for patient assistance via online portal.   Phone: [phone]  

## 2022-08-20 ENCOUNTER — Encounter: Payer: Self-pay | Admitting: Pharmacist

## 2022-09-08 NOTE — Telephone Encounter (Signed)
Received notification from [employer] regarding approval for OZEMPIC. Patient assistance approved until 05/12/23.  Medication will ship to office  Phone: [phone]  

## 2022-09-11 ENCOUNTER — Encounter: Payer: Self-pay | Admitting: Pharmacist

## 2022-09-11 ENCOUNTER — Ambulatory Visit (INDEPENDENT_AMBULATORY_CARE_PROVIDER_SITE_OTHER): Payer: Medicare HMO | Admitting: Pharmacist

## 2022-09-11 NOTE — Progress Notes (Signed)
   09/11/2022 Name: Dennis Spencer MRN: [id number] DOB: 03/01/1946             S:  76 yoM Presents for weight loss evaluation, education, and management.   He is interested in starting Ozempic for weight loss.  He had gastric bypass surgery years ago (2004).  He has continued to keep off 100lbs+.  He has also seen nutrition to aid with his diet/lifestyle modifications.   Insurance coverage/medication affordability: [employer] medicare    Patient reports adherence with medications. Current medications for weight loss: OZEMPIC   Patient is retired and admits he needs to increase activity.  Ozempic is only recommend along with diet and exercise.  Encouraged patient.   Discussed meal planning options and Plate method for healthy eating Avoid sugary drinks and desserts Incorporate balanced protein, non starchy veggies, 1 serving of carbohydrate with each meal Increase water intake Increase physical activity as able     A/P:   -Healthy eating and meal planning discussed (continue to follow with nutrition)   -Increase water and exercise   -Continue Ozempic 1mg  weekly (if covered).  Patient denies history of thyroid/medullary cancer.             Work to eat low fat smaller meals to reduce side effects             Decreased carbonated beverages               Written patient instructions provided.  Total time in face to face counseling 25 minutes.   Kieth Brightly, PharmD, BCACP Clinical Pharmacist, [facility]  II Phone [phone]

## 2022-09-18 ENCOUNTER — Telehealth: Payer: Self-pay | Admitting: Pharmacist

## 2022-09-18 NOTE — Telephone Encounter (Signed)
Please cancel patient assistance program Patient no longer qualifies for Novo Nordisk PAP (he does not have T2DM) Thank you!

## 2022-09-23 ENCOUNTER — Encounter: Payer: Self-pay | Admitting: Family Medicine

## 2022-10-22 ENCOUNTER — Other Ambulatory Visit: Payer: Self-pay | Admitting: Family Medicine

## 2022-10-22 DIAGNOSIS — E7800 Pure hypercholesterolemia, unspecified: Secondary | ICD-10-CM

## 2022-10-30 ENCOUNTER — Ambulatory Visit (INDEPENDENT_AMBULATORY_CARE_PROVIDER_SITE_OTHER): Payer: Medicare HMO

## 2022-10-30 VITALS — Ht 70.0 in | Wt 295.0 lb

## 2022-10-30 DIAGNOSIS — Z0000 Encounter for general adult medical examination without abnormal findings: Secondary | ICD-10-CM

## 2022-10-30 NOTE — Progress Notes (Signed)
Subjective:   Dennis Spencer is a 77 y.o. male who presents for Medicare Annual/Subsequent preventive examination.  Visit Complete: Virtual  I connected with  Dennis Spencer on 10/30/22 by a audio enabled telemedicine application and verified that I am speaking with the correct person using two identifiers.  Patient Location: Home  Provider Location: Home Office  I discussed the limitations of evaluation and management by telemedicine. The patient expressed understanding and agreed to proceed.  Patient Medicare AWV questionnaire was completed by the patient on 10/30/2022; I have confirmed that all information answered by patient is correct and no changes since this date.  Review of Systems     Cardiac Risk Factors include: advanced age (>46men, >52 women);hypertension;dyslipidemia;male gender     Objective:    Today's Vitals   10/30/22 0943  Weight: 295 lb (133.8 kg)  Height: 5\' 10"  (1.778 m)   Body mass index is 42.33 kg/m.     10/30/2022    9:46 AM 10/28/2021    9:54 AM 10/25/2020   10:54 AM 01/23/2020    2:25 PM 01/04/2020    1:03 PM 12/16/2019    1:19 PM 10/25/2019   10:47 AM  Advanced Directives  Does Patient Have a Medical Advance Directive? No No No No No No No  Would patient like information on creating a medical advance directive? No - Patient declined Yes (MAU/Ambulatory/Procedural Areas - Information given) Yes (MAU/Ambulatory/Procedural Areas - Information given) No - Patient declined  No - Patient declined No - Patient declined    Current Medications (verified) Outpatient Encounter Medications as of 10/30/2022  Medication Sig   amLODipine (NORVASC) 5 MG tablet Take 1 tablet (5 mg total) by mouth in the morning.   atorvastatin (LIPITOR) 80 MG tablet TAKE 1 TABLET BY MOUTH EVERY DAY   b complex vitamins tablet Take 1 tablet by mouth daily.   Calcium Citrate-Vitamin D (CITRACAL + D PO) Take 1 tablet by mouth 3 (three) times daily.    Cholecalciferol (VITAMIN  D3) 2000 UNITS capsule Take 2,000 Units by mouth 2 (two) times daily.    escitalopram (LEXAPRO) 20 MG tablet Take 1 tablet (20 mg total) by mouth daily.   gabapentin (NEURONTIN) 600 MG tablet Take 2 tablets (1,200 mg total) by mouth daily.   Multiple Vitamin (MULTIVITAMIN WITH MINERALS) TABS tablet Take 1 tablet by mouth daily.   pantoprazole (PROTONIX) 40 MG tablet Take 1 tablet (40 mg total) by mouth daily. For stomach   Semaglutide, 1 MG/DOSE, (OZEMPIC, 1 MG/DOSE,) 4 MG/3ML SOPN INJECT 1MG  INTO THE SKIN ONCE A WEEK   ticagrelor (BRILINTA) 60 MG TABS tablet Take 1 tablet (60 mg total) by mouth 2 (two) times daily.   vitamin C (ASCORBIC ACID) 500 MG tablet Take 500 mg by mouth 2 (two) times daily.   No facility-administered encounter medications on file as of 10/30/2022.    Allergies (verified) Lisinopril   History: Past Medical History:  Diagnosis Date   Anticoagulant long-term use    brilinta   Arthritis    Knees   Carotid stenosis, bilateral    per duplex 04-23-2018  bilateral ICA <50%   Chronic cough    per pt this normal due to tracheostomy   CKD (chronic kidney disease), stage III (HCC) 11/10/2013   Dyspnea    GERD (gastroesophageal reflux disease)    History of CVA (cerebrovascular accident) 04/13/2018   chronic lacunar infarct--- per pt no residuals   History of kidney stones  multiple kidney stones   History of osteomyelitis age 57   LLE   Hyperlipidemia    Hypertension    OSA (obstructive sleep apnea) study in epic 09-25-2005  severe osa   1981 s/p tracheostomy, tongue reduction and septoplasty;  LEAVES TRACH OPEN AT NIGHT FOR SLEEP APNEA   Presence of tracheostomy (HCC) since 1981   uncaps at night due to sleep apnea - caps during the day   Renal calculi    bilateral   Severe obesity (BMI >= 40) (HCC)    Stroke (HCC) 2019   Possible TIA in the past   Urgency of urination    Wears glasses    Past Surgical History:  Procedure Laterality Date    CHOLECYSTECTOMY N/A 06/15/2015   Procedure: LAPAROSCOPIC CHOLECYSTECTOMY;  Surgeon: Axel Filler, MD;  Location: [facility];  Service: General;  Laterality: N/A;   COLONOSCOPY  last one 03-28-2016   CYSTOSCOPY W/ URETERAL STENT REMOVAL Right 06/26/2017   Procedure: CYSTOSCOPY WITH STENT REMOVAL;  Surgeon: Sebastian Ache, MD;  Location: [facility];  Service: Urology;  Laterality: Right;   CYSTOSCOPY W/ URETERAL STENT REMOVAL Bilateral 10/22/2018   Procedure: CYSTOSCOPY WITH STENT REMOVAL;  Surgeon: Sebastian Ache, MD;  Location: [facility];  Service: Urology;  Laterality: Bilateral;   CYSTOSCOPY WITH LITHOLAPAXY N/A 12/23/2019   Procedure: CYSTOSCOPY WITH LITHOLAPAXY;  Surgeon: Sebastian Ache, MD;  Location: [facility];  Service: Urology;  Laterality: N/A;   CYSTOSCOPY WITH RETROGRADE PYELOGRAM, URETEROSCOPY AND STENT PLACEMENT Bilateral 06/18/2013   Procedure: CYSTOSCOPY WITH RETROGRADE PYELOGRAM, Left URETEROSCOPY with laser , AND bilateral STENT PLACEMENT;  Surgeon: Sebastian Ache, MD;  Location: [facility];  Service: Urology;  Laterality: Bilateral;   CYSTOSCOPY WITH RETROGRADE PYELOGRAM, URETEROSCOPY AND STENT PLACEMENT Right 08/08/2013   Procedure: CYSTOSCOPY WITH RIGHT RETROGRADE PYELOGRAM, URETEROSCOPY AND STENT PLACEMENT, stone extraction;  Surgeon: Sebastian Ache, MD;  Location: [facility];  Service: Urology;  Laterality: Right;   CYSTOSCOPY WITH RETROGRADE PYELOGRAM, URETEROSCOPY AND STENT PLACEMENT Right 08/10/2013   Procedure: 2ND STAGE CYSTOSCOPY WITH RETROGRADE PYELOGRAM, URETEROSCOPY AND STENT EXCHANGE;  Surgeon: Sebastian Ache, MD;  Location: [facility];  Service: Urology;  Laterality: Right;   CYSTOSCOPY WITH RETROGRADE PYELOGRAM, URETEROSCOPY AND STENT PLACEMENT Bilateral 05/22/2017   Procedure: CYSTOSCOPY WITH RETROGRADE PYELOGRAM, URETEROSCOPY AND STENT PLACEMENT;  Surgeon: Sebastian Ache, MD;  Location: [facility];  Service: Urology;  Laterality: Bilateral;   CYSTOSCOPY WITH RETROGRADE PYELOGRAM, URETEROSCOPY AND  STENT PLACEMENT Bilateral 06/05/2017   Procedure: CYSTOSCOPY WITH RETROGRADE PYELOGRAM, URETEROSCOPY AND STENT PLACEMENT;  Surgeon: Sebastian Ache, MD;  Location: [facility];  Service: Urology;  Laterality: Bilateral;   CYSTOSCOPY WITH RETROGRADE PYELOGRAM, URETEROSCOPY AND STENT PLACEMENT Bilateral 08/13/2018   Procedure: CYSTOSCOPY WITH RETROGRADE PYELOGRAM, URETEROSCOPY AND STENT PLACEMENT;  Surgeon: Sebastian Ache, MD;  Location: [facility];  Service: Urology;  Laterality: Bilateral;  90 MINS   CYSTOSCOPY WITH RETROGRADE PYELOGRAM, URETEROSCOPY AND STENT PLACEMENT Bilateral 10/22/2018   Procedure: CYSTOSCOPY WITH RETROGRADE PYELOGRAM, URETEROSCOPY  STONE BASKETRY AND STENT PLACEMENT;  Surgeon: Sebastian Ache, MD;  Location: [facility];  Service: Urology;  Laterality: Bilateral;   CYSTOSCOPY WITH RETROGRADE PYELOGRAM, URETEROSCOPY AND STENT PLACEMENT Bilateral 12/23/2019   Procedure: CYSTOSCOPY WITH RETROGRADE PYELOGRAM, URETEROSCOPY AND STENT PLACEMENT;  Surgeon: Sebastian Ache, MD;  Location: [facility];  Service: Urology;  Laterality: Bilateral;  90 MINS   CYSTOSCOPY WITH RETROGRADE PYELOGRAM, URETEROSCOPY AND STENT PLACEMENT Bilateral 01/06/2020   Procedure: CYSTOSCOPY WITH RETROGRADE PYELOGRAM, URETEROSCOPY AND STENT EXCHANGE, RETROGRADE, BASKETING OF STONES;  Surgeon: Berneice Heinrich,  Normand Sloop, MD;  Location: [facility];  Service: Urology;  Laterality: Bilateral;  90 MINS   CYSTOSCOPY/URETEROSCOPY/HOLMIUM LASER/STENT PLACEMENT Left 06/26/2017   Procedure: CYSTOSCOPY/URETEROSCOPY third stage/HOLMIUM LASER/STENT PLACEMENT retrograde pylegram;  Surgeon: Sebastian Ache, MD;  Location: [facility];  Service: Urology;  Laterality: Left;   GASTRIC ROUX-EN-Y  2004    @[facility]    HOLMIUM LASER APPLICATION Bilateral 06/18/2013   Procedure: HOLMIUM LASER APPLICATION;  Surgeon: Sebastian Ache, MD;  Location: [facility];  Service: Urology;  Laterality: Bilateral;   HOLMIUM LASER APPLICATION Left 08/10/2013   Procedure: HOLMIUM LASER APPLICATION;   Surgeon: Sebastian Ache, MD;  [facility]: [facility];  Service: Urology;  Laterality: Left;   HOLMIUM LASER APPLICATION Bilateral 05/22/2017   Procedure: HOLMIUM LASER APPLICATION;  Surgeon: Sebastian Ache, MD;  Location: [facility];  Service: Urology;  Laterality: Bilateral;   HOLMIUM LASER APPLICATION Bilateral 08/13/2018   Procedure: HOLMIUM LASER APPLICATION;  Surgeon: Sebastian Ache, MD;  Location: [facility];  Service: Urology;  Laterality: Bilateral;   HOLMIUM LASER APPLICATION Bilateral 10/22/2018   Procedure: HOLMIUM LASER APPLICATION;  Surgeon: Sebastian Ache, MD;  Location: [facility];  Service: Urology;  Laterality: Bilateral;   HOLMIUM LASER APPLICATION Bilateral 12/23/2019   Procedure: HOLMIUM LASER APPLICATION;  Surgeon: Sebastian Ache, MD;  Location: [facility];  Service: Urology;  Laterality: Bilateral;   NEPHROLITHOTOMY Left 08/08/2013   Procedure: 1ST STAGE NEPHROLITHOTOMY PERCUTANEOUS WITH SURGEON ACCESS;  Surgeon: Sebastian Ache, MD;  Location: [facility];  Service: Urology;  Laterality: Left;   NEPHROLITHOTOMY Left 08/10/2013   Procedure: NEPHROLITHOTOMY PERCUTANEOUS SECOND LOOK/LEFT DIGITAL URETEROSCOPY/BASKETING OF STONE/EXCHANGE OF LEFT URETERAL STENT;  Surgeon: Sebastian Ache, MD;  Location: [facility];  Service: Urology;  Laterality: Left;   ORCHIOPEXY  child   unlateral undescended testis   PERCUTANEOUS NEPHROSTOLITHOTOMY Left 09-19-2009   dr Retta Diones  @[facility]    TONSILLECTOMY  child   TRACHEOSTOMY  1987   s/p tongue reduction and nasal septum deviation repair for  Sleep apnea    URETEROSCOPY WITH HOLMIUM LASER LITHOTRIPSY Left 04-14-2002   dr Retta Diones  @[facility]    WISDOM TOOTH EXTRACTION     Family History  Problem Relation Age of Onset   Cancer Mother        Ovarian, died age 53.   Diabetes Father    Hypertension Father    Heart failure Father        Died of MI age 45.   Breast cancer Maternal Grandmother    Colon cancer Maternal Grandfather    Esophageal cancer Neg Hx    Stomach cancer Neg Hx     Rectal cancer Neg Hx    Social History   Socioeconomic History   Marital status: Married    Spouse name: Rosey Bath   Number of children: 3   Years of education: Not on file   Highest education level: Master's degree (e.g., MA, MS, MEng, MEd, MSW, MBA)  Occupational History   Occupation: Retired, Programmer, multimedia.    Employer: [employer]  Tobacco Use   Smoking status: Former    Packs/day: 1.00    Years: 8.00    Additional pack years: 0.00    Total pack years: 8.00    Types: Cigarettes    Quit date: 05/12/1968    Years since quitting: 54.5   Smokeless tobacco: Never  Vaping Use   Vaping Use: Never used  Substance and Sexual Activity   Alcohol use: Not Currently   Drug use: Never   Sexual activity: Yes    Birth control/protection: None  Other Topics Concern   Not on file  Social History Narrative   Married, wife Rosey Bath.  1 daughter, 2 sons. Daughter and grandchildren currently reside with them. 1 dog and 1 cat as pets. Enjoys reading and photography.    Social Determinants of Health   Financial Resource Strain: Low Risk  (10/30/2022)   Overall Financial Resource Strain (CARDIA)    Difficulty of Paying Living Expenses: Not hard at all  Food Insecurity: No Food Insecurity (10/30/2022)   Hunger Vital Sign    Worried About Running Out of Food in the Last Year: Never true    Ran Out of Food in the Last Year: Never true  Transportation Needs: No Transportation Needs (10/30/2022)   PRAPARE - Administrator, Civil Service (Medical): No    Lack of Transportation (Non-Medical): No  Physical Activity: Insufficiently Active (10/30/2022)   Exercise Vital Sign    Days of Exercise per Week: 3 days    Minutes of Exercise per Session: 30 min  Stress: No Stress Concern Present (10/30/2022)   [employer] of Occupational Health - Occupational Stress Questionnaire    Feeling of Stress : Not at all  Social Connections: Moderately Integrated (10/30/2022)   Social Connection and  Isolation Panel [NHANES]    Frequency of Communication with Friends and Family: More than three times a week    Frequency of Social Gatherings with Friends and Family: More than three times a week    Attends Religious Services: More than 4 times per year    Active Member of [employer] or Organizations: No    Attends Engineer, structural: Never    Marital Status: Married    Tobacco Counseling Counseling given: Not Answered   Clinical Intake:  Pre-visit preparation completed: Yes  Pain : No/denies pain     Nutritional Risks: None Diabetes: No  How often do you need to have someone help you when you read instructions, pamphlets, or other written materials from your doctor or pharmacy?: 1 - Never  Interpreter Needed?: No  Information entered by :: Renie Ora, LPN   Activities of Daily Living    10/30/2022    9:47 AM  In your present state of health, do you have any difficulty performing the following activities:  Hearing? 0  Vision? 0  Difficulty concentrating or making decisions? 0  Walking or climbing stairs? 0  Dressing or bathing? 0  Doing errands, shopping? 0  Preparing Food and eating ? N  Using the Toilet? N  In the past six months, have you accidently leaked urine? N  Do you have problems with loss of bowel control? N  Managing your Medications? N  Managing your Finances? N  Housekeeping or managing your Housekeeping? N    Patient Care Team: Mechele Claude, MD as PCP - General (Family Medicine) Berneice Heinrich Delbert Phenix., MD as Consulting Physician (Urology) Eugenia Mcalpine, MD as Consulting Physician (Orthopedic Surgery) Delora Fuel, OD (Optometry)  Indicate any recent Medical Services you may have received from other than [facility] providers in the past year (date may be approximate).     Assessment:   This is a routine wellness examination for Dennis Spencer.  Hearing/Vision screen Vision Screening - Comments:: Wears rx glasses - up to date with routine  eye exams with  Dr.johnson   Dietary issues and exercise activities discussed:     Goals Addressed             This Visit's Progress    DIET -  EAT MORE FRUITS AND VEGETABLES   On track      Depression Screen    10/30/2022    9:46 AM 07/17/2022    3:33 PM 07/17/2022    3:23 PM 04/10/2022    3:22 PM 02/27/2022    4:08 PM 10/28/2021    9:52 AM 09/03/2021    3:39 PM  PHQ 2/9 Scores  PHQ - 2 Score 0 2 0 2 2 2 2   PHQ- 9 Score  9  5 5 6 6     Fall Risk    10/30/2022    9:44 AM 07/17/2022    3:23 PM 04/10/2022    3:19 PM 02/27/2022    4:08 PM 10/28/2021    9:49 AM  Fall Risk   Falls in the past year? 0 0 0 0 0  Number falls in past yr: 0    0  Injury with Fall? 0    0  Risk for fall due to : No Fall Risks    No Fall Risks  Follow up Falls prevention discussed    Falls prevention discussed    MEDICARE RISK AT HOME:  Medicare Risk at Home - 10/30/22 0944     Any stairs in or around the home? Yes    If so, are there any without handrails? No    Home free of loose throw rugs in walkways, pet beds, electrical cords, etc? Yes    Adequate lighting in your home to reduce risk of falls? Yes    Life alert? No    Use of a cane, walker or w/c? Yes    Grab bars in the bathroom? Yes    Shower chair or bench in shower? Yes    Elevated toilet seat or a handicapped toilet? Yes             TIMED UP AND GO:  Was the test performed?  No    Cognitive Function:        10/30/2022    9:47 AM 10/28/2021    9:53 AM 10/25/2020   10:54 AM 10/25/2019   10:50 AM 09/27/2018   10:41 AM  6CIT Screen  What Year? 0 points 0 points 0 points 0 points 0 points  What month? 0 points 0 points 0 points 0 points 0 points  What time? 0 points 0 points 0 points 0 points 0 points  Count back from 20 0 points 0 points 0 points 0 points 0 points  Months in reverse 0 points 0 points 0 points 0 points 0 points  Repeat phrase 0 points 0 points 0 points 0 points 0 points  Total Score 0 points 0 points 0  points 0 points 0 points    Immunizations Immunization History  Administered Date(s) Administered   Fluad Quad(high Dose 65+) 05/02/2019, 01/31/2020, 04/23/2021, 02/27/2022   Influenza,inj,Quad PF,6+ Mos 06/20/2013, 02/11/2017, 02/09/2018   Influenza-Unspecified 07/26/2015   Moderna SARS-COV2 Booster Vaccination 03/14/2020   Moderna Sars-Covid-2 Vaccination 06/28/2019, 07/26/2019, 12/26/2020   Pneumococcal Conjugate-13 01/08/2016   Pneumococcal Polysaccharide-23 08/12/2017   Tdap 01/31/2020   Zoster Recombinat (Shingrix) 09/03/2021, 02/27/2022    TDAP status: Up to date  Flu Vaccine status: Up to date  Pneumococcal vaccine status: Up to date  Covid-19 vaccine status: Completed vaccines  Qualifies for Shingles Vaccine? Yes   Zostavax completed Yes   Shingrix Completed?: Yes  Screening Tests Health Maintenance  Topic Date Due   Colonoscopy  03/28/2021   COVID-19 Vaccine (5 - 2023-24 season) 01/10/2022  INFLUENZA VACCINE  12/11/2022   Medicare Annual Wellness (AWV)  10/30/2023   DTaP/Tdap/Td (2 - Td or Tdap) 01/30/2030   Pneumonia Vaccine 85+ Years old  Completed   Hepatitis C Screening  Completed   Zoster Vaccines- Shingrix  Completed   HPV VACCINES  Aged Out    Health Maintenance  Health Maintenance Due  Topic Date Due   Colonoscopy  03/28/2021   COVID-19 Vaccine (5 - 2023-24 season) 01/10/2022    Colorectal cancer screening: No longer required.   Lung Cancer Screening: (Low Dose CT Chest recommended if Age 43-80 years, 20 pack-year currently smoking OR have quit w/in 15years.) does not qualify.   Lung Cancer Screening Referral: n/a  Additional Screening:  Hepatitis C Screening: does not qualify; Completed 01/31/2020  Vision Screening: Recommended annual ophthalmology exams for early detection of glaucoma and other disorders of the eye. Is the patient up to date with their annual eye exam?  Yes  Who is the provider or what is the name of the office in  which the patient attends annual eye exams? Dr.Johnson  If pt is not established with a provider, would they like to be referred to a provider to establish care? No .   Dental Screening: Recommended annual dental exams for proper oral hygiene  Diabetic Foot Exam: Diabetic Foot Exam: Overdue, Pt has been advised about the importance in completing this exam. Pt is scheduled for diabetic foot exam on next office visit .  Community Resource Referral / Chronic Care Management: CRR required this visit?  No   CCM required this visit?  No     Plan:     I have personally reviewed and noted the following in the patient's chart:   Medical and social history Use of alcohol, tobacco or illicit drugs  Current medications and supplements including opioid prescriptions. Patient is not currently taking opioid prescriptions. Functional ability and status Nutritional status Physical activity Advanced directives List of other physicians Hospitalizations, surgeries, and ER visits in previous 12 months Vitals Screenings to include cognitive, depression, and falls Referrals and appointments  In addition, I have reviewed and discussed with patient certain preventive protocols, quality metrics, and best practice recommendations. A written personalized care plan for preventive services as well as general preventive health recommendations were provided to patient.     Lorrene Reid, LPN   [id number]/61/0960   After Visit Summary: (MyChart) Due to this being a telephonic visit, the after visit summary with patients personalized plan was offered to patient via MyChart   Nurse Notes: none

## 2022-10-30 NOTE — Patient Instructions (Signed)
[name] , Thank you for taking time to come for your Medicare Wellness Visit. I appreciate your ongoing commitment to your health goals. Please review the following plan we discussed and let me know if I can assist you in the future.   These are the goals we discussed:  Goals       "I would like to improve my knee pain so that I can get around better and not be in as much pain" (pt-stated)      Current Barriers:  Chronic Disease Management support and education needs related to arthritis  Nurse Case Manager Clinical Goal(s):  Over the next 7 days, patient will attend all scheduled medical appointments: 01/19/2019  Interventions:  Verified upcoming appointment with Dr Thomasena Edis at [facility] [phone]) on 01/19/19 at 2:30 Verified with patient that he is aware of the appointment information Patient is looking forward to the visit and is optimistic about the outcome RNCM will f/u with patient by telephone after his visit to discuss the plan of care  Patient Self Care Activities:  Performs ADL's independently Performs IADL's independently  Please see past updates related to this goal by clicking on the "Past Updates" button in the selected goal        "I would like to increase my physical activity level" (pt-stated)      Current Barriers:  Bilateral knee pain Obesity Covid 19 social distancing restrictions  Nurse Case Manager Clinical Goal(s):  Over the next 30 days, patient will work with [facility] to identify ways to increase physical activity level with consideration for chronic knee pain  Interventions:  RNCM will review ortho surgeons notes and recommendations after patient's visit on 01/19/2019 RNCM will collaborate with patient's care team regarding plan of care for knee pain and how to increase activity level appropriately   Patient Self Care Activities:  Performs ADL's independently Performs IADL's independently  Initial goal documentation        Chronic Disease Management Needs      CARE PLAN ENTRY (see longtitudinal plan of care for additional care plan information)  Current Barriers:  Chronic Disease Management support, education, and care coordination needs related to Obesity, HTN, CKD, Arthritis, bilateral chronic knee pain  Clinical Goal(s) related to Obesity, HTN, CKD, Arthritis, bilateral chronic knee pain:  Over the next 60 days, patient will:  Work with the care management team to address educational, disease management, and care coordination needs  Begin or continue self health monitoring activities as directed today Measure and record blood pressure 4 times per week Call provider office for new or worsened signs and symptoms Blood pressure findings outside established parameters Call care management team with questions or concerns Verbalize basic understanding of patient centered plan of care established today  Interventions related to Obesity, HTN, CKD, Arthritis, bilateral chronic knee pain:  Evaluation of current treatment plans and patient's adherence to plan as established by provider Assessed patient understanding of disease states Assessed patient's education and care coordination needs Provided disease specific education to patient  Collaborated with appropriate clinical care team members regarding patient needs  Patient Self Care Activities related to Obesity, HTN, CKD, Arthritis, bilateral chronic knee pain:  Patient is able to perform ADLs and IADLs independently   Initial goal documentation       DIET - EAT MORE FRUITS AND VEGETABLES      Patient Stated      10/25/2019 AWV Goal: Keep All Scheduled Appointments  Over the next year, patient will attend  all scheduled appointments with their PCP and any specialists that they see.         This is a list of the screening recommended for you and due dates:  Health Maintenance  Topic Date Due   Colon Cancer Screening  03/28/2021   COVID-19 Vaccine (5 -  2023-24 season) 01/10/2022   Flu Shot  12/11/2022   Medicare Annual Wellness Visit  10/30/2023   DTaP/Tdap/Td vaccine (2 - Td or Tdap) 01/30/2030   Pneumonia Vaccine  Completed   Hepatitis C Screening  Completed   Zoster (Shingles) Vaccine  Completed   HPV Vaccine  Aged Out    Advanced directives: Advance directive discussed with you today. I have provided a copy for you to complete at home and have notarized. Once this is complete please bring a copy in to our office so we can scan it into your chart.   Conditions/risks identified: Aim for 30 minutes of exercise or brisk walking, 6-8 glasses of water, and 5 servings of fruits and vegetables each day.   Next appointment: Follow up in one year for your annual wellness visit.   Preventive Care 16 Years and Older, Male  Preventive care refers to lifestyle choices and visits with your health care provider that can promote health and wellness. What does preventive care include? A yearly physical exam. This is also called an annual well check. Dental exams once or twice a year. Routine eye exams. Ask your health care provider how often you should have your eyes checked. Personal lifestyle choices, including: Daily care of your teeth and gums. Regular physical activity. Eating a healthy diet. Avoiding tobacco and drug use. Limiting alcohol use. Practicing safe sex. Taking low doses of aspirin every day. Taking vitamin and mineral supplements as recommended by your health care provider. What happens during an annual well check? The services and screenings done by your health care provider during your annual well check will depend on your age, overall health, lifestyle risk factors, and family history of disease. Counseling  Your health care provider may ask you questions about your: Alcohol use. Tobacco use. Drug use. Emotional well-being. Home and relationship well-being. Sexual activity. Eating habits. History of falls. Memory  and ability to understand (cognition). Work and work Astronomer. Screening  You may have the following tests or measurements: Height, weight, and BMI. Blood pressure. Lipid and cholesterol levels. These may be checked every 5 years, or more frequently if you are over 27 years old. Skin check. Lung cancer screening. You may have this screening every year starting at age 23 if you have a 30-pack-year history of smoking and currently smoke or have quit within the past 15 years. Fecal occult blood test (FOBT) of the stool. You may have this test every year starting at age 61. Flexible sigmoidoscopy or colonoscopy. You may have a sigmoidoscopy every 5 years or a colonoscopy every 10 years starting at age 58. Prostate cancer screening. Recommendations will vary depending on your family history and other risks. Hepatitis C blood test. Hepatitis B blood test. Sexually transmitted disease (STD) testing. Diabetes screening. This is done by checking your blood sugar (glucose) after you have not eaten for a while (fasting). You may have this done every 1-3 years. Abdominal aortic aneurysm (AAA) screening. You may need this if you are a current or former smoker. Osteoporosis. You may be screened starting at age 64 if you are at high risk. Talk with your health care provider about your test results, treatment  options, and if necessary, the need for more tests. Vaccines  Your health care provider may recommend certain vaccines, such as: Influenza vaccine. This is recommended every year. Tetanus, diphtheria, and acellular pertussis (Tdap, Td) vaccine. You may need a Td booster every 10 years. Zoster vaccine. You may need this after age 82. Pneumococcal 13-valent conjugate (PCV13) vaccine. One dose is recommended after age 77. Pneumococcal polysaccharide (PPSV23) vaccine. One dose is recommended after age 46. Talk to your health care provider about which screenings and vaccines you need and how often you  need them. This information is not intended to replace advice given to you by your health care provider. Make sure you discuss any questions you have with your health care provider. Document Released: 05/25/2015 Document Revised: 01/16/2016 Document Reviewed: 02/27/2015 Elsevier Interactive Patient Education  2017 [employer].  Fall Prevention in the Home Falls can cause injuries. They can happen to people of all ages. There are many things you can do to make your home safe and to help prevent falls. What can I do on the outside of my home? Regularly fix the edges of walkways and driveways and fix any cracks. Remove anything that might make you trip as you walk through a door, such as a raised step or threshold. Trim any bushes or trees on the path to your home. Use bright outdoor lighting. Clear any walking paths of anything that might make someone trip, such as rocks or tools. Regularly check to see if handrails are loose or broken. Make sure that both sides of any steps have handrails. Any raised decks and porches should have guardrails on the edges. Have any leaves, snow, or ice cleared regularly. Use sand or salt on walking paths during winter. Clean up any spills in your garage right away. This includes oil or grease spills. What can I do in the bathroom? Use night lights. Install grab bars by the toilet and in the tub and shower. Do not use towel bars as grab bars. Use non-skid mats or decals in the tub or shower. If you need to sit down in the shower, use a plastic, non-slip stool. Keep the floor dry. Clean up any water that spills on the floor as soon as it happens. Remove soap buildup in the tub or shower regularly. Attach bath mats securely with double-sided non-slip rug tape. Do not have throw rugs and other things on the floor that can make you trip. What can I do in the bedroom? Use night lights. Make sure that you have a light by your bed that is easy to reach. Do not  use any sheets or blankets that are too big for your bed. They should not hang down onto the floor. Have a firm chair that has side arms. You can use this for support while you get dressed. Do not have throw rugs and other things on the floor that can make you trip. What can I do in the kitchen? Clean up any spills right away. Avoid walking on wet floors. Keep items that you use a lot in easy-to-reach places. If you need to reach something above you, use a strong step stool that has a grab bar. Keep electrical cords out of the way. Do not use floor polish or wax that makes floors slippery. If you must use wax, use non-skid floor wax. Do not have throw rugs and other things on the floor that can make you trip. What can I do with my stairs? Do not  leave any items on the stairs. Make sure that there are handrails on both sides of the stairs and use them. Fix handrails that are broken or loose. Make sure that handrails are as long as the stairways. Check any carpeting to make sure that it is firmly attached to the stairs. Fix any carpet that is loose or worn. Avoid having throw rugs at the top or bottom of the stairs. If you do have throw rugs, attach them to the floor with carpet tape. Make sure that you have a light switch at the top of the stairs and the bottom of the stairs. If you do not have them, ask someone to add them for you. What else can I do to help prevent falls? Wear shoes that: Do not have high heels. Have rubber bottoms. Are comfortable and fit you well. Are closed at the toe. Do not wear sandals. If you use a stepladder: Make sure that it is fully opened. Do not climb a closed stepladder. Make sure that both sides of the stepladder are locked into place. Ask someone to hold it for you, if possible. Clearly mark and make sure that you can see: Any grab bars or handrails. First and last steps. Where the edge of each step is. Use tools that help you move around (mobility  aids) if they are needed. These include: Canes. Walkers. Scooters. Crutches. Turn on the lights when you go into a dark area. Replace any light bulbs as soon as they burn out. Set up your furniture so you have a clear path. Avoid moving your furniture around. If any of your floors are uneven, fix them. If there are any pets around you, be aware of where they are. Review your medicines with your doctor. Some medicines can make you feel dizzy. This can increase your chance of falling. Ask your doctor what other things that you can do to help prevent falls. This information is not intended to replace advice given to you by your health care provider. Make sure you discuss any questions you have with your health care provider. Document Released: 02/22/2009 Document Revised: 10/04/2015 Document Reviewed: 06/02/2014 Elsevier Interactive Patient Education  2017 [employer].

## 2022-12-26 NOTE — Telephone Encounter (Signed)
Auto refills discontinued.

## 2023-01-14 ENCOUNTER — Other Ambulatory Visit: Payer: Self-pay | Admitting: Family Medicine

## 2023-01-14 ENCOUNTER — Encounter: Payer: Self-pay | Admitting: Family Medicine

## 2023-01-14 DIAGNOSIS — I10 Essential (primary) hypertension: Secondary | ICD-10-CM

## 2023-01-15 NOTE — Telephone Encounter (Signed)
Pt. Needs to be seen. See if DOD for Friday can take see him.

## 2023-01-21 ENCOUNTER — Ambulatory Visit (INDEPENDENT_AMBULATORY_CARE_PROVIDER_SITE_OTHER): Payer: Medicare HMO | Admitting: Family Medicine

## 2023-01-21 ENCOUNTER — Encounter: Payer: Self-pay | Admitting: Family Medicine

## 2023-01-21 VITALS — BP 140/60 | HR 63 | Temp 97.4°F | Ht 70.0 in | Wt 320.8 lb

## 2023-01-21 DIAGNOSIS — I10 Essential (primary) hypertension: Secondary | ICD-10-CM | POA: Diagnosis not present

## 2023-01-21 DIAGNOSIS — E7800 Pure hypercholesterolemia, unspecified: Secondary | ICD-10-CM | POA: Diagnosis not present

## 2023-01-21 DIAGNOSIS — M109 Gout, unspecified: Secondary | ICD-10-CM | POA: Diagnosis not present

## 2023-01-21 DIAGNOSIS — Z23 Encounter for immunization: Secondary | ICD-10-CM | POA: Diagnosis not present

## 2023-01-21 MED ORDER — AMLODIPINE BESYLATE 5 MG PO TABS
5.0000 mg | ORAL_TABLET | Freq: Every morning | ORAL | 3 refills | Status: DC
Start: 2023-01-21 — End: 2024-02-29

## 2023-01-21 MED ORDER — OZEMPIC (1 MG/DOSE) 4 MG/3ML ~~LOC~~ SOPN: 1.0000 mg | PEN_INJECTOR | SUBCUTANEOUS | Status: DC

## 2023-01-21 MED ORDER — ATORVASTATIN CALCIUM 80 MG PO TABS
80.0000 mg | ORAL_TABLET | Freq: Every day | ORAL | 3 refills | Status: DC
Start: 2023-01-21 — End: 2024-02-02

## 2023-01-21 NOTE — Progress Notes (Signed)
Subjective:  Patient ID: Dennis Spencer, male    DOB: [birth year]  Age: 77 y.o. MRN: [id number]  CC: Medical Management of Chronic Issues and Gout   HPI Dennis Spencer presents for  presents for  follow-up of hypertension. Patient has no history of headache chest pain or shortness of breath or recent cough. Patient also denies symptoms of TIA such as focal numbness or weakness. Patient denies side effects from medication. States taking it regularly.   in for follow-up of elevated cholesterol. Doing well without complaints on current medication. Denies side effects of statin including myalgia and arthralgia and nausea. Currently no chest pain, shortness of breath or other cardiovascular related symptoms noted.   Flare of gout recently. Big toe on left foot red, painful. Now it is tender. Ibuprofen helped. Last 2 days not very painful. Previous episdes on the right 2 years ago.      01/21/2023    9:58 AM 01/21/2023    9:45 AM 10/30/2022    9:46 AM  Depression screen PHQ 2/9  Decreased Interest 1 0 0  Down, Depressed, Hopeless 2 0 0  PHQ - 2 Score 3 0 0  Altered sleeping 0    Tired, decreased energy 3    Change in appetite 0    Feeling bad or failure about yourself  1    Trouble concentrating 0    Moving slowly or fidgety/restless 1    Suicidal thoughts 0    PHQ-9 Score 8    Difficult doing work/chores Somewhat difficult      History Dennis Spencer has a past medical history of Anticoagulant long-term use, Arthritis, Carotid stenosis, bilateral, Chronic cough, CKD (chronic kidney disease), stage III (HCC) (11/10/2013), Dyspnea, GERD (gastroesophageal reflux disease), History of CVA (cerebrovascular accident) (04/13/2018), History of kidney stones, History of osteomyelitis (age 61), Hyperlipidemia, Hypertension, OSA (obstructive sleep apnea) (study in epic 09-25-2005  severe osa), Presence of tracheostomy (HCC) (since 1981), Renal calculi, Severe obesity (BMI >= 40) (HCC), Stroke (HCC) (2019),  Urgency of urination, and Wears glasses.   Dennis Spencer has a past surgical history that includes Cystoscopy with retrograde pyelogram, ureteroscopy and stent placement (Bilateral, 06/18/2013); Holmium laser application (Bilateral, 06/18/2013); Tracheostomy (1987); Nephrolithotomy (Left, 08/08/2013); Cystoscopy with retrograde pyelogram, ureteroscopy and stent placement (Right, 08/08/2013); Nephrolithotomy (Left, 08/10/2013); Cystoscopy with retrograde pyelogram, ureteroscopy and stent placement (Right, 08/10/2013); Holmium laser application (Left, 08/10/2013); Cholecystectomy (N/A, 06/15/2015); Colonoscopy (last one 03-28-2016); Cystoscopy with retrograde pyelogram, ureteroscopy and stent placement (Bilateral, 05/22/2017); Holmium laser application (Bilateral, 05/22/2017); Cystoscopy with retrograde pyelogram, ureteroscopy and stent placement (Bilateral, 06/05/2017); Cystoscopy/ureteroscopy/holmium laser/stent placement (Left, 06/26/2017); Cystoscopy w/ ureteral stent removal (Right, 06/26/2017); Tonsillectomy (child); Gastric Roux-En-Y (2004    @[facility] ); Percutaneous nephrostolithotomy (Left, 09-19-2009   dr Retta Diones  @[facility] ); Ureteroscopy with holmium laser lithotripsy (Left, 04-14-2002   dr Retta Diones  @[facility] ); Orchiopexy (child); Cystoscopy with retrograde pyelogram, ureteroscopy and stent placement (Bilateral, 08/13/2018); Holmium laser application (Bilateral, 08/13/2018); Wisdom tooth extraction; Cystoscopy with retrograde pyelogram, ureteroscopy and stent placement (Bilateral, 10/22/2018); Holmium laser application (Bilateral, 10/22/2018); Cystoscopy w/ ureteral stent removal (Bilateral, 10/22/2018); Cystoscopy with retrograde pyelogram, ureteroscopy and stent placement (Bilateral, 12/23/2019); Holmium laser application (Bilateral, 12/23/2019); Cystoscopy with litholapaxy (N/A, 12/23/2019); and Cystoscopy with retrograde pyelogram, ureteroscopy and stent placement (Bilateral, 01/06/2020).   His family history includes Breast cancer in his maternal  grandmother; Cancer in his mother; Colon cancer in his maternal grandfather; Diabetes in his father; Heart failure in his father; Hypertension in his father.Dennis Spencer reports that Dennis Spencer quit smoking  about 54 years ago. His smoking use included cigarettes. Dennis Spencer started smoking about 62 years ago. Dennis Spencer has a 8 pack-year smoking history. Dennis Spencer has never used smokeless tobacco. Dennis Spencer reports that Dennis Spencer does not currently use alcohol. Dennis Spencer reports that Dennis Spencer does not use drugs.    ROS Review of Systems  Constitutional:  Negative for fever.  Respiratory:  Negative for shortness of breath.   Cardiovascular:  Negative for chest pain.  Musculoskeletal:  Negative for arthralgias.  Skin:  Negative for rash.    Objective:  BP (!) 140/60   Pulse 63   Temp (!) 97.4 F (36.3 C)   Ht 5\' 10"  (1.778 m)   Wt (!) 320 lb 12.8 oz (145.5 kg)   SpO2 98%   BMI 46.03 kg/m   BP Readings from Last 3 Encounters:  01/21/23 (!) 140/60  07/17/22 130/62  04/10/22 132/63    Wt Readings from Last 3 Encounters:  01/21/23 (!) 320 lb 12.8 oz (145.5 kg)  10/30/22 295 lb (133.8 kg)  07/17/22 (!) 309 lb 3.2 oz (140.3 kg)     Physical Exam Vitals reviewed.  Constitutional:      Appearance: Dennis Spencer is well-developed.  HENT:     Head: Normocephalic and atraumatic.     Right Ear: External ear normal.     Left Ear: External ear normal.     Mouth/Throat:     Pharynx: No oropharyngeal exudate or posterior oropharyngeal erythema.  Eyes:     Pupils: Pupils are equal, round, and reactive to light.  Cardiovascular:     Rate and Rhythm: Normal rate and regular rhythm.     Heart sounds: No murmur heard. Pulmonary:     Effort: No respiratory distress.     Breath sounds: Normal breath sounds.  Musculoskeletal:     Cervical back: Normal range of motion and neck supple.  Skin:    Findings: Erythema (base of left 1st toe at MTP) present.  Neurological:     Mental Status: Dennis Spencer is alert and oriented to person, place, and time.       Assessment &  Plan:   Dennis Spencer" was seen today for medical management of chronic issues and gout.  Diagnoses and all orders for this visit:  Primary hypertension -     CBC with Differential/Platelet -     CMP14+EGFR -     amLODipine (NORVASC) 5 MG tablet; Take 1 tablet (5 mg total) by mouth in the morning.  Hypercholesteremia -     Lipid panel -     atorvastatin (LIPITOR) 80 MG tablet; Take 1 tablet (80 mg total) by mouth daily.  Essential hypertension -     CBC with Differential/Platelet -     amLODipine (NORVASC) 5 MG tablet; Take 1 tablet (5 mg total) by mouth in the morning.  Acute gout, unspecified cause, unspecified site -     CBC with Differential/Platelet -     Uric Acid  Morbid obesity (HCC) -     CBC with Differential/Platelet  Encounter for immunization -     Flu Vaccine Trivalent High Dose (Fluad)  Other orders -     Semaglutide, 1 MG/DOSE, (OZEMPIC, 1 MG/DOSE,) 4 MG/3ML SOPN; Inject 1 mg into the skin once a week.       I have changed Dennis Spencer "Steve"'s amLODipine, atorvastatin, and Ozempic (1 MG/DOSE). I am also having him maintain his ascorbic acid, Vitamin D3, multivitamin with minerals, b complex vitamins, Calcium Citrate-Vitamin D (CITRACAL + D  PO), escitalopram, gabapentin, pantoprazole, and ticagrelor.  Allergies as of 01/21/2023       Reactions   Lisinopril Other (See Comments)   DIZZINESS        Medication List        Accurate as of January 21, 2023 10:05 PM. If you have any questions, ask your nurse or doctor.          amLODipine 5 MG tablet Commonly known as: NORVASC Take 1 tablet (5 mg total) by mouth in the morning. What changed: additional instructions Changed by: Lizbeth Feijoo   ascorbic acid 500 MG tablet Commonly known as: VITAMIN C Take 500 mg by mouth 2 (two) times daily.   atorvastatin 80 MG tablet Commonly known as: LIPITOR Take 1 tablet (80 mg total) by mouth daily.   b complex vitamins tablet Take 1 tablet  by mouth daily.   CITRACAL + D PO Take 1 tablet by mouth 3 (three) times daily.   escitalopram 20 MG tablet Commonly known as: LEXAPRO Take 1 tablet (20 mg total) by mouth daily.   gabapentin 600 MG tablet Commonly known as: NEURONTIN Take 2 tablets (1,200 mg total) by mouth daily.   multivitamin with minerals Tabs tablet Take 1 tablet by mouth daily.   Ozempic (1 MG/DOSE) 4 MG/3ML Sopn Generic drug: Semaglutide (1 MG/DOSE) Inject 1 mg into the skin once a week. What changed: See the new instructions. Changed by: Caleb Decock   pantoprazole 40 MG tablet Commonly known as: PROTONIX Take 1 tablet (40 mg total) by mouth daily. For stomach   ticagrelor 60 MG Tabs tablet Commonly known as: Brilinta Take 1 tablet (60 mg total) by mouth 2 (two) times daily.   Vitamin D3 50 MCG (2000 UT) capsule Take 2,000 Units by mouth 2 (two) times daily.         Follow-up: Return in about 6 months (around 07/21/2023).  Mechele Claude, M.D.

## 2023-01-22 LAB — CMP14+EGFR
ALT: 15 IU/L (ref 0–44)
AST: 24 IU/L (ref 0–40)
Albumin: 3.8 g/dL (ref 3.8–4.8)
Alkaline Phosphatase: 103 IU/L (ref 44–121)
BUN/Creatinine Ratio: 12 (ref 10–24)
BUN: 17 mg/dL (ref 8–27)
Bilirubin Total: 0.5 mg/dL (ref 0.0–1.2)
CO2: 25 mmol/L (ref 20–29)
Calcium: 9.3 mg/dL (ref 8.6–10.2)
Chloride: 103 mmol/L (ref 96–106)
Creatinine, Ser: 1.45 mg/dL — ABNORMAL HIGH (ref 0.76–1.27)
Globulin, Total: 2.7 g/dL (ref 1.5–4.5)
Glucose: 95 mg/dL (ref 70–99)
Potassium: 4.9 mmol/L (ref 3.5–5.2)
Sodium: 140 mmol/L (ref 134–144)
Total Protein: 6.5 g/dL (ref 6.0–8.5)
eGFR: 50 mL/min/{1.73_m2} — ABNORMAL LOW (ref >59)

## 2023-01-22 LAB — CBC WITH DIFFERENTIAL/PLATELET
Basophils Absolute: 0.1 10*3/uL (ref 0.0–0.2)
Basos: 1 %
EOS (ABSOLUTE): 0.3 10*3/uL (ref 0.0–0.4)
Eos: 6 %
Hematocrit: 37 % — ABNORMAL LOW (ref 37.5–51.0)
Hemoglobin: 12.2 g/dL — ABNORMAL LOW (ref 13.0–17.7)
Immature Grans (Abs): 0 10*3/uL (ref 0.0–0.1)
Immature Granulocytes: 0 %
Lymphocytes Absolute: 1.6 10*3/uL (ref 0.7–3.1)
Lymphs: 31 %
MCH: 29.3 pg (ref 26.6–33.0)
MCHC: 33 g/dL (ref 31.5–35.7)
MCV: 89 fL (ref 79–97)
Monocytes Absolute: 0.7 10*3/uL (ref 0.1–0.9)
Monocytes: 14 %
Neutrophils Absolute: 2.5 10*3/uL (ref 1.4–7.0)
Neutrophils: 48 %
Platelets: 242 10*3/uL (ref 150–450)
RBC: 4.17 x10E6/uL (ref 4.14–5.80)
RDW: 13.5 % (ref 11.6–15.4)
WBC: 5.2 10*3/uL (ref 3.4–10.8)

## 2023-01-22 LAB — LIPID PANEL
Chol/HDL Ratio: 2.6 ratio (ref 0.0–5.0)
Cholesterol, Total: 124 mg/dL (ref 100–199)
HDL: 47 mg/dL (ref >39)
LDL Chol Calc (NIH): 59 mg/dL (ref 0–99)
Triglycerides: 92 mg/dL (ref 0–149)
VLDL Cholesterol Cal: 18 mg/dL (ref 5–40)

## 2023-01-22 LAB — URIC ACID: Uric Acid: 7.2 mg/dL (ref 3.8–8.4)

## 2023-01-23 NOTE — Progress Notes (Signed)
Hello Dennis Spencer,  Your lab result is normal and/or stable.Some minor variations that are not significant are commonly marked abnormal, but do not represent any medical problem for you.  Best regards, Mechele Claude, M.D.

## 2023-02-26 ENCOUNTER — Encounter: Payer: Self-pay | Admitting: Family Medicine

## 2023-02-26 ENCOUNTER — Ambulatory Visit: Payer: Medicare HMO | Admitting: Family Medicine

## 2023-02-26 VITALS — BP 131/57 | HR 71 | Temp 97.2°F | Ht 70.0 in | Wt 324.2 lb

## 2023-02-26 DIAGNOSIS — J329 Chronic sinusitis, unspecified: Secondary | ICD-10-CM | POA: Diagnosis not present

## 2023-02-26 DIAGNOSIS — J40 Bronchitis, not specified as acute or chronic: Secondary | ICD-10-CM | POA: Diagnosis not present

## 2023-02-26 MED ORDER — AMOXICILLIN-POT CLAVULANATE 875-125 MG PO TABS: 1.0000 | ORAL_TABLET | Freq: Two times a day (BID) | ORAL | 0 refills | Status: DC

## 2023-02-26 MED ORDER — PSEUDOEPHEDRINE-GUAIFENESIN ER 120-1200 MG PO TB12: 1.0000 | ORAL_TABLET | Freq: Two times a day (BID) | ORAL | 0 refills | Status: DC

## 2023-02-26 NOTE — Progress Notes (Signed)
Chief Complaint  Patient presents with   Sinusitis   Cough   Sore Throat    HPI  Patient presents today for Symptoms include congestion, facial pain, nasal congestion, brown productive cough, post nasal drip and sinus pressure. There is no fever, chills, or sweats. Onset of symptoms was three weeks ago, gradually worsening since that time.    PMH: Smoking status noted ROS: Per HPI  Objective: BP (!) 131/57   Pulse 71   Temp (!) 97.2 F (36.2 C)   Ht 5\' 10"  (1.778 m)   Wt (!) 324 lb 3.2 oz (147.1 kg)   SpO2 96%   BMI 46.52 kg/m  Gen: NAD, alert, cooperative with exam HEENT: NCAT, EOMI, PERRL CV: RRR, good S1/S2, no murmur Resp: CTABL, no wheezes, non-labored Abd: SNTND, BS present, no guarding or organomegaly Ext: No edema, warm Neuro: Alert and oriented, No gross deficits  Assessment and plan:  1. Sinobronchitis     Meds ordered this encounter  Medications   amoxicillin-clavulanate (AUGMENTIN) 875-125 MG tablet    Sig: Take 1 tablet by mouth 2 (two) times daily. Take all of this medication    Dispense:  20 tablet    Refill:  0   Pseudoephedrine-Guaifenesin [phone] MG TB12    Sig: Take 1 tablet by mouth 2 (two) times daily. For congestion    Dispense:  14 tablet    Refill:  0    No orders of the defined types were placed in this encounter.   Follow up as needed.  Mechele Claude, MD

## 2023-03-17 ENCOUNTER — Encounter: Payer: Self-pay | Admitting: Family Medicine

## 2023-03-17 ENCOUNTER — Other Ambulatory Visit: Payer: Self-pay | Admitting: Family Medicine

## 2023-03-17 MED ORDER — MOXIFLOXACIN HCL 400 MG PO TABS: 400.0000 mg | ORAL_TABLET | Freq: Every day | ORAL | 0 refills | Status: DC

## 2023-07-19 ENCOUNTER — Other Ambulatory Visit: Payer: Self-pay | Admitting: Family Medicine

## 2023-07-21 ENCOUNTER — Ambulatory Visit: Payer: Medicare HMO | Admitting: Family Medicine

## 2023-07-22 ENCOUNTER — Encounter: Payer: Self-pay | Admitting: Family Medicine

## 2023-08-11 ENCOUNTER — Encounter: Payer: Self-pay | Admitting: Family Medicine

## 2023-08-11 ENCOUNTER — Ambulatory Visit (INDEPENDENT_AMBULATORY_CARE_PROVIDER_SITE_OTHER): Admitting: Family Medicine

## 2023-08-11 ENCOUNTER — Ambulatory Visit (INDEPENDENT_AMBULATORY_CARE_PROVIDER_SITE_OTHER)

## 2023-08-11 VITALS — BP 125/59 | HR 72 | Temp 98.0°F | Ht 70.0 in | Wt 324.0 lb

## 2023-08-11 DIAGNOSIS — M869 Osteomyelitis, unspecified: Secondary | ICD-10-CM | POA: Diagnosis not present

## 2023-08-11 DIAGNOSIS — G8929 Other chronic pain: Secondary | ICD-10-CM

## 2023-08-11 DIAGNOSIS — M25562 Pain in left knee: Secondary | ICD-10-CM

## 2023-08-11 DIAGNOSIS — M79605 Pain in left leg: Secondary | ICD-10-CM | POA: Diagnosis not present

## 2023-08-11 DIAGNOSIS — I10 Essential (primary) hypertension: Secondary | ICD-10-CM

## 2023-08-11 DIAGNOSIS — M25561 Pain in right knee: Secondary | ICD-10-CM | POA: Diagnosis not present

## 2023-08-11 DIAGNOSIS — K219 Gastro-esophageal reflux disease without esophagitis: Secondary | ICD-10-CM | POA: Diagnosis not present

## 2023-08-11 DIAGNOSIS — M1712 Unilateral primary osteoarthritis, left knee: Secondary | ICD-10-CM | POA: Diagnosis not present

## 2023-08-11 DIAGNOSIS — M79662 Pain in left lower leg: Secondary | ICD-10-CM | POA: Diagnosis not present

## 2023-08-11 MED ORDER — GABAPENTIN 600 MG PO TABS: ORAL_TABLET | ORAL | 3 refills | Status: DC

## 2023-08-11 MED ORDER — TICAGRELOR 60 MG PO TABS: 60.0000 mg | ORAL_TABLET | Freq: Two times a day (BID) | ORAL | 1 refills | Status: DC

## 2023-08-11 MED ORDER — PANTOPRAZOLE SODIUM 40 MG PO TBEC: 40.0000 mg | DELAYED_RELEASE_TABLET | Freq: Every day | ORAL | 3 refills | Status: AC

## 2023-08-11 MED ORDER — ESCITALOPRAM OXALATE 20 MG PO TABS: 20.0000 mg | ORAL_TABLET | Freq: Every day | ORAL | 3 refills | Status: DC

## 2023-08-11 NOTE — Progress Notes (Signed)
 Subjective:  Patient ID: Dennis Spencer, male    DOB: 18-May-1945  Age: 78 y.o. MRN: [id number]  CC: Fall (Two falls in the last month. The first time he got tripped up. The second time he started feeling really tired and he went to sit down and then he was on the floor.  Felt a little dizzy that time nad wonders if having inner ear issue. ), Leg Pain (Right shin pain that is sharp. Started yesterday. ), and Nephrolithiasis (Passed a couple kidney stones monday and Tuesday. No pain since. )   HPI Dennis Spencer presents for pain in knees increasing. Requesting more gabapentin.  He gets partial relief from 1200 mg daily.  Asks if he can have 1/3 pill.  He denies any drowsiness with the exception if he takes multiple pills in the morning.  Currently he is taking 1 in the morning and 1 in the evening or 2 in the evening.  Patient reports kidney stones passed over the last couple of days.  The pain has gotten much milder over the years.  He has frequent kidney stones and has had to have surgery to have them removed on multiple occasions.  These passed easily.  Patient in for follow-up of GERD. Currently asymptomatic taking  PPI daily. There is no chest pain or heartburn. No hematemesis and no melena. No dysphagia or choking. Onset is remote. Progression is stable. Complicating factors, none.   Patient reports falling as noted above.  The first time he tripped over something in his home.  He denied injury.  This happened a few weeks ago.  The second fall occurred when he was standing still for extended period of time at the kitchen while cooking.  He started to feel weak so he sat down in the stool and passed out hitting the floor.  This was 2 weeks ago.  He sustained no significant injury.  He he has not had any recurrence.  He denies spending.  No ear pain.  He was apparently out for a matter of the couple of seconds based on feedback from family members who are in the room.  Pain is in left shin.  Sharp. NKI. Onset yesterday.      08/11/2023    4:08 PM 02/26/2023   12:59 PM 02/26/2023   12:52 PM  Depression screen PHQ 2/9  Decreased Interest 1 2 0  Down, Depressed, Hopeless 2 2 0  PHQ - 2 Score 3 4 0  Altered sleeping 0 1   Tired, decreased energy 3 3   Change in appetite 0 2   Feeling bad or failure about yourself  2 1   Trouble concentrating 0 1   Moving slowly or fidgety/restless 1 2   Suicidal thoughts 0 0   PHQ-9 Score 9 14   Difficult doing work/chores  Very difficult     History Dennis Spencer has a past medical history of Anticoagulant long-term use, Arthritis, Carotid stenosis, bilateral, Chronic cough, CKD (chronic kidney disease), stage III (HCC) (11/10/2013), Dyspnea, GERD (gastroesophageal reflux disease), History of CVA (cerebrovascular accident) (04/13/2018), History of kidney stones, History of osteomyelitis (age 81), Hyperlipidemia, Hypertension, OSA (obstructive sleep apnea) (study in epic 09-25-2005  severe osa), Presence of tracheostomy (HCC) (since 1981), Renal calculi, Severe obesity (BMI >= 40) (HCC), Stroke (HCC) (2019), Urgency of urination, and Wears glasses.   He has a past surgical history that includes Cystoscopy with retrograde pyelogram, ureteroscopy and stent placement (Bilateral, 06/18/2013); Holmium laser application (Bilateral, 06/18/2013);  Tracheostomy (1987); Nephrolithotomy (Left, 08/08/2013); Cystoscopy with retrograde pyelogram, ureteroscopy and stent placement (Right, 08/08/2013); Nephrolithotomy (Left, 08/10/2013); Cystoscopy with retrograde pyelogram, ureteroscopy and stent placement (Right, 08/10/2013); Holmium laser application (Left, 08/10/2013); Cholecystectomy (N/A, 06/15/2015); Colonoscopy (last one 03-28-2016); Cystoscopy with retrograde pyelogram, ureteroscopy and stent placement (Bilateral, 05/22/2017); Holmium laser application (Bilateral, 05/22/2017); Cystoscopy with retrograde pyelogram, ureteroscopy and stent placement (Bilateral, 06/05/2017);  Cystoscopy/ureteroscopy/holmium laser/stent placement (Left, 06/26/2017); Cystoscopy w/ ureteral stent removal (Right, 06/26/2017); Tonsillectomy (child); Gastric Roux-En-Y (2004    @[facility] ); Percutaneous nephrostolithotomy (Left, 09-19-2009   dr Retta Diones  @[facility] ); Ureteroscopy with holmium laser lithotripsy (Left, 04-14-2002   dr Retta Diones  @[facility] ); Orchiopexy (child); Cystoscopy with retrograde pyelogram, ureteroscopy and stent placement (Bilateral, 08/13/2018); Holmium laser application (Bilateral, 08/13/2018); Wisdom tooth extraction; Cystoscopy with retrograde pyelogram, ureteroscopy and stent placement (Bilateral, 10/22/2018); Holmium laser application (Bilateral, 10/22/2018); Cystoscopy w/ ureteral stent removal (Bilateral, 10/22/2018); Cystoscopy with retrograde pyelogram, ureteroscopy and stent placement (Bilateral, 12/23/2019); Holmium laser application (Bilateral, 12/23/2019); Cystoscopy with litholapaxy (N/A, 12/23/2019); and Cystoscopy with retrograde pyelogram, ureteroscopy and stent placement (Bilateral, 01/06/2020).   His family history includes Breast cancer in his maternal grandmother; Cancer in his mother; Colon cancer in his maternal grandfather; Diabetes in his father; Heart failure in his father; Hypertension in his father.He reports that he quit smoking about 55 years ago. His smoking use included cigarettes. He started smoking about 63 years ago. He has a 8 pack-year smoking history. He has never used smokeless tobacco. He reports that he does not currently use alcohol. He reports that he does not use drugs.    ROS Review of Systems  Constitutional:  Negative for fever.  Respiratory:  Negative for shortness of breath.   Cardiovascular:  Negative for chest pain.  Musculoskeletal:  Negative for arthralgias.  Skin:  Negative for rash.    Objective:  BP (!) 125/59   Pulse 72   Temp 98 F (36.7 C)   Ht 5\' 10"  (1.778 m)   Wt (!) 324 lb (147 kg)   SpO2 96%   BMI 46.49 kg/m   BP Readings from  Last 3 Encounters:  08/11/23 (!) 125/59  02/26/23 (!) 131/57  01/21/23 (!) 140/60    Wt Readings from Last 3 Encounters:  08/11/23 (!) 324 lb (147 kg)  02/26/23 (!) 324 lb 3.2 oz (147.1 kg)  01/21/23 (!) 320 lb 12.8 oz (145.5 kg)     Physical Exam Vitals reviewed.  Constitutional:      Appearance: He is well-developed.  HENT:     Head: Normocephalic and atraumatic.     Right Ear: External ear normal.     Left Ear: External ear normal.     Mouth/Throat:     Pharynx: No oropharyngeal exudate or posterior oropharyngeal erythema.  Eyes:     Pupils: Pupils are equal, round, and reactive to light.  Cardiovascular:     Rate and Rhythm: Normal rate and regular rhythm.     Heart sounds: No murmur heard. Pulmonary:     Effort: No respiratory distress.     Breath sounds: Normal breath sounds.  Musculoskeletal:     Cervical back: Normal range of motion and neck supple.  Neurological:     Mental Status: He is alert and oriented to person, place, and time.     Cranial Nerves: No cranial nerve deficit.     Motor: No weakness.     Coordination: Coordination normal.     Gait: Gait normal.  Psychiatric:  Thought Content: Thought content normal.      Assessment & Plan:  Primary hypertension -     Urinalysis, Complete  Leg pain, anterior, left -     DG Tibia/Fibula Left; Future  Bilateral chronic knee pain -     Gabapentin; Take 1 each morning and two every evening.  Dispense: 270 tablet; Refill: 3  Gastroesophageal reflux disease without esophagitis -     Pantoprazole Sodium; Take 1 tablet (40 mg total) by mouth daily. For stomach  Dispense: 90 tablet; Refill: 3  Other orders -     Escitalopram Oxalate; Take 1 tablet (20 mg total) by mouth daily.  Dispense: 90 tablet; Refill: 3 -     Ticagrelor; Take 1 tablet (60 mg total) by mouth 2 (two) times daily.  Dispense: 180 tablet; Refill: 1     Follow-up: Return in about 6 months (around 02/10/2024) for [facility]  physical.  Mechele Claude, M.D.

## 2023-11-02 ENCOUNTER — Ambulatory Visit (INDEPENDENT_AMBULATORY_CARE_PROVIDER_SITE_OTHER)

## 2023-11-02 VITALS — BP 125/59 | HR 72 | Ht 70.0 in | Wt 324.0 lb

## 2023-11-02 DIAGNOSIS — Z1211 Encounter for screening for malignant neoplasm of colon: Secondary | ICD-10-CM

## 2023-11-02 DIAGNOSIS — Z5986 Financial insecurity: Secondary | ICD-10-CM

## 2023-11-02 DIAGNOSIS — Z0000 Encounter for general adult medical examination without abnormal findings: Secondary | ICD-10-CM | POA: Diagnosis not present

## 2023-11-02 NOTE — Patient Instructions (Signed)
 [name] , Thank you for taking time out of your busy schedule to complete your Annual Wellness Visit with me. I enjoyed our conversation and look forward to speaking with you again next year. I, as well as your care team,  appreciate your ongoing commitment to your health goals. Please review the following plan we discussed and let me know if I can assist you in the future. Your Game plan/ To Do List    Follow up Visits: Next Medicare AWV with our clinical staff: 11/02/24 at 11:20a.m.   Next Office Visit with your provider: n/a  Clinician Recommendations:  Aim for 30 minutes of exercise or brisk walking, 6-8 glasses of water, and 5 servings of fruits and vegetables each day. N/a      This is a list of the screening recommended for you and due dates:  Health Maintenance  Topic Date Due   Colon Cancer Screening  03/28/2021   COVID-19 Vaccine (5 - 2024-25 season) 01/11/2023   Flu Shot  12/11/2023   Medicare Annual Wellness Visit  11/01/2024   DTaP/Tdap/Td vaccine (2 - Td or Tdap) 01/30/2030   Pneumococcal Vaccine for age over 32  Completed   Hepatitis C Screening  Completed   Zoster (Shingles) Vaccine  Completed   HPV Vaccine  Aged Out   Meningitis B Vaccine  Aged Out    Advanced directives: (Declined) Advance directive discussed with you today. Even though you declined this today, please call our office should you change your mind, and we can give you the proper paperwork for you to fill out. Advance Care Planning is important because it:  [x]  Makes sure you receive the medical care that is consistent with your values, goals, and preferences  [x]  It provides guidance to your family and loved ones and reduces their decisional burden about whether or not they are making the right decisions based on your wishes.  Follow the link provided in your after visit summary or read over the paperwork we have mailed to you to help you started getting your Advance Directives in place. If you need  assistance in completing these, please reach out to us  so that we can help you!  See attachments for Preventive Care and Fall Prevention Tips.

## 2023-11-02 NOTE — Progress Notes (Signed)
 Subjective:   Dennis Spencer is a 78 y.o. who presents for a Medicare Wellness preventive visit.  As a reminder, Annual Wellness Visits don't include a physical exam, and some assessments may be limited, especially if this visit is performed virtually. We may recommend an in-person follow-up visit with your provider if needed.  Visit Complete: Virtual I connected with  Dennis Spencer on 11/02/23 by a audio enabled telemedicine application and verified that I am speaking with the correct person using two identifiers.  Patient Location: Home  Provider Location: Home Office  I discussed the limitations of evaluation and management by telemedicine. The patient expressed understanding and agreed to proceed.  Vital Signs: Because this visit was a virtual/telehealth visit, some criteria may be missing or patient reported. Any vitals not documented were not able to be obtained and vitals that have been documented are patient reported.  VideoDeclined- This patient declined Librarian, academic. Therefore the visit was completed with audio only.  Persons Participating in Visit: Patient.  AWV Questionnaire: No: Patient Medicare AWV questionnaire was not completed prior to this visit.  Cardiac Risk Factors include: advanced age (>44men, >62 women);dyslipidemia;hypertension;male gender;obesity (BMI >30kg/m2)     Objective:    Today's Vitals   11/02/23 1336  BP: (!) 125/59  Pulse: 72  Weight: (!) 324 lb (147 kg)  Height: 5' 10 (1.778 m)   Body mass index is 46.49 kg/m.     11/02/2023    1:29 PM 10/30/2022    9:46 AM 10/28/2021    9:54 AM 10/25/2020   10:54 AM 01/23/2020    2:25 PM 01/04/2020    1:03 PM 12/16/2019    1:19 PM  Advanced Directives  Does Patient Have a Medical Advance Directive? No No No No No No No  Would patient like information on creating a medical advance directive?  No - Patient declined Yes (MAU/Ambulatory/Procedural Areas - Information  given) Yes (MAU/Ambulatory/Procedural Areas - Information given) No - Patient declined  No - Patient declined    Current Medications (verified) Outpatient Encounter Medications as of 11/02/2023  Medication Sig   amLODipine  (NORVASC ) 5 MG tablet Take 1 tablet (5 mg total) by mouth in the morning.   atorvastatin  (LIPITOR) 80 MG tablet Take 1 tablet (80 mg total) by mouth daily.   b complex vitamins tablet Take 1 tablet by mouth daily.   Calcium  Citrate-Vitamin D  (CITRACAL + D PO) Take 1 tablet by mouth 3 (three) times daily.    Cholecalciferol  (VITAMIN D3) 2000 UNITS capsule Take 2,000 Units by mouth 2 (two) times daily.    escitalopram  (LEXAPRO ) 20 MG tablet Take 1 tablet (20 mg total) by mouth daily.   gabapentin  (NEURONTIN ) 600 MG tablet Take 1 each morning and two every evening.   Multiple Vitamin (MULTIVITAMIN WITH MINERALS) TABS tablet Take 1 tablet by mouth daily.   pantoprazole  (PROTONIX ) 40 MG tablet Take 1 tablet (40 mg total) by mouth daily. For stomach   ticagrelor  (BRILINTA ) 60 MG TABS tablet Take 1 tablet (60 mg total) by mouth 2 (two) times daily.   vitamin C  (ASCORBIC ACID ) 500 MG tablet Take 500 mg by mouth 2 (two) times daily.   No facility-administered encounter medications on file as of 11/02/2023.    Allergies (verified) Lisinopril    History: Past Medical History:  Diagnosis Date   Anticoagulant long-term use    brilinta    Arthritis    Knees   Carotid stenosis, bilateral    per duplex  04-23-2018  bilateral ICA <50%   Chronic cough    per pt this normal due to tracheostomy   CKD (chronic kidney disease), stage III (HCC) 11/10/2013   Dyspnea    GERD (gastroesophageal reflux disease)    History of CVA (cerebrovascular accident) 04/13/2018   chronic lacunar infarct--- per pt no residuals   History of kidney stones    multiple kidney stones   History of osteomyelitis age 72   LLE   Hyperlipidemia    Hypertension    OSA (obstructive sleep apnea) study in epic  09-25-2005  severe osa   1981 s/p tracheostomy, tongue reduction and septoplasty;  LEAVES TRACH OPEN AT NIGHT FOR SLEEP APNEA   Presence of tracheostomy (HCC) since 1981   uncaps at night due to sleep apnea - caps during the day   Renal calculi    bilateral   Severe obesity (BMI >= 40) (HCC)    Stroke (HCC) 2019   Possible TIA in the past   Urgency of urination    Wears glasses    Past Surgical History:  Procedure Laterality Date   CHOLECYSTECTOMY N/A 06/15/2015   Procedure: LAPAROSCOPIC CHOLECYSTECTOMY;  Surgeon: Lynda Leos, MD;  Location: [facility];  Service: General;  Laterality: N/A;   COLONOSCOPY  last one 03-28-2016   CYSTOSCOPY W/ URETERAL STENT REMOVAL Right 06/26/2017   Procedure: CYSTOSCOPY WITH STENT REMOVAL;  Surgeon: Alvaro Hummer, MD;  Location: [facility];  Service: Urology;  Laterality: Right;   CYSTOSCOPY W/ URETERAL STENT REMOVAL Bilateral 10/22/2018   Procedure: CYSTOSCOPY WITH STENT REMOVAL;  Surgeon: Alvaro Hummer, MD;  Location: [facility];  Service: Urology;  Laterality: Bilateral;   CYSTOSCOPY WITH LITHOLAPAXY N/A 12/23/2019   Procedure: CYSTOSCOPY WITH LITHOLAPAXY;  Surgeon: Alvaro Hummer, MD;  Location: [facility];  Service: Urology;  Laterality: N/A;   CYSTOSCOPY WITH RETROGRADE PYELOGRAM, URETEROSCOPY AND STENT PLACEMENT Bilateral 06/18/2013   Procedure: CYSTOSCOPY WITH RETROGRADE PYELOGRAM, Left URETEROSCOPY with laser , AND bilateral STENT PLACEMENT;  Surgeon: Hummer Alvaro, MD;  Location: [facility];  Service: Urology;  Laterality: Bilateral;   CYSTOSCOPY WITH RETROGRADE PYELOGRAM, URETEROSCOPY AND STENT PLACEMENT Right 08/08/2013   Procedure: CYSTOSCOPY WITH RIGHT RETROGRADE PYELOGRAM, URETEROSCOPY AND STENT PLACEMENT, stone extraction;  Surgeon: Hummer Alvaro, MD;  Location: [facility];  Service: Urology;  Laterality: Right;   CYSTOSCOPY WITH RETROGRADE PYELOGRAM, URETEROSCOPY AND STENT PLACEMENT Right 08/10/2013   Procedure: 2ND STAGE CYSTOSCOPY WITH RETROGRADE PYELOGRAM,  URETEROSCOPY AND STENT EXCHANGE;  Surgeon: Hummer Alvaro, MD;  Location: [facility];  Service: Urology;  Laterality: Right;   CYSTOSCOPY WITH RETROGRADE PYELOGRAM, URETEROSCOPY AND STENT PLACEMENT Bilateral 05/22/2017   Procedure: CYSTOSCOPY WITH RETROGRADE PYELOGRAM, URETEROSCOPY AND STENT PLACEMENT;  Surgeon: Alvaro Hummer, MD;  [facility]: [facility];  Service: Urology;  Laterality: Bilateral;   CYSTOSCOPY WITH RETROGRADE PYELOGRAM, URETEROSCOPY AND STENT PLACEMENT Bilateral 06/05/2017   Procedure: CYSTOSCOPY WITH RETROGRADE PYELOGRAM, URETEROSCOPY AND STENT PLACEMENT;  Surgeon: Alvaro Hummer, MD;  [facility]: [facility];  Service: Urology;  Laterality: Bilateral;   CYSTOSCOPY WITH RETROGRADE PYELOGRAM, URETEROSCOPY AND STENT PLACEMENT Bilateral 08/13/2018   Procedure: CYSTOSCOPY WITH RETROGRADE PYELOGRAM, URETEROSCOPY AND STENT PLACEMENT;  Surgeon: Alvaro Hummer, MD;  [facility]: [facility];  Service: Urology;  Laterality: Bilateral;  90 MINS   CYSTOSCOPY WITH RETROGRADE PYELOGRAM, URETEROSCOPY AND STENT PLACEMENT Bilateral 10/22/2018   Procedure: CYSTOSCOPY WITH RETROGRADE PYELOGRAM, URETEROSCOPY  STONE BASKETRY AND STENT PLACEMENT;  Surgeon: Alvaro Hummer, MD;  [facility]: [facility];  Service: Urology;  Laterality: Bilateral;   CYSTOSCOPY WITH RETROGRADE  PYELOGRAM, URETEROSCOPY AND STENT PLACEMENT Bilateral 12/23/2019   Procedure: CYSTOSCOPY WITH RETROGRADE PYELOGRAM, URETEROSCOPY AND STENT PLACEMENT;  Surgeon: Alvaro Hummer, MD;  [facility]: [facility];  Service: Urology;  Laterality: Bilateral;  90 MINS   CYSTOSCOPY WITH RETROGRADE PYELOGRAM, URETEROSCOPY AND STENT PLACEMENT Bilateral 01/06/2020   Procedure: CYSTOSCOPY WITH RETROGRADE PYELOGRAM, URETEROSCOPY AND STENT EXCHANGE, RETROGRADE, BASKETING OF STONES;  Surgeon: Alvaro Hummer, MD;  Location: [facility];  Service: Urology;  Laterality: Bilateral;  90 MINS   CYSTOSCOPY/URETEROSCOPY/HOLMIUM LASER/STENT PLACEMENT Left 06/26/2017   Procedure: CYSTOSCOPY/URETEROSCOPY third  stage/HOLMIUM LASER/STENT PLACEMENT retrograde pylegram;  Surgeon: Alvaro Hummer, MD;  [facility]: [facility];  Service: Urology;  Laterality: Left;   GASTRIC ROUX-EN-Y  2004    @[facility]    HOLMIUM LASER APPLICATION Bilateral 06/18/2013   Procedure: HOLMIUM LASER APPLICATION;  Surgeon: Hummer Alvaro, MD;  Location: [facility];  Service: Urology;  Laterality: Bilateral;   HOLMIUM LASER APPLICATION Left 08/10/2013   Procedure: HOLMIUM LASER APPLICATION;  Surgeon: Hummer Alvaro, MD;  [facility];  Service: Urology;  Laterality: Left;   HOLMIUM LASER APPLICATION Bilateral 05/22/2017   Procedure: HOLMIUM LASER APPLICATION;  Surgeon: Alvaro Hummer, MD;  [facility];  Service: Urology;  Laterality: Bilateral;   HOLMIUM LASER APPLICATION Bilateral 08/13/2018   Procedure: HOLMIUM LASER APPLICATION;  Surgeon: Alvaro Hummer, MD;  [facility];  Service: Urology;  Laterality: Bilateral;   HOLMIUM LASER APPLICATION Bilateral 10/22/2018   Procedure: HOLMIUM LASER APPLICATION;  Surgeon: Alvaro Hummer, MD;  [facility]: [facility];  Service: Urology;  Laterality: Bilateral;   HOLMIUM LASER APPLICATION Bilateral 12/23/2019   Procedure: HOLMIUM LASER APPLICATION;  Surgeon: Alvaro Hummer, MD;  [facility];  Service: Urology;  Laterality: Bilateral;   NEPHROLITHOTOMY Left 08/08/2013   Procedure: 1ST STAGE NEPHROLITHOTOMY PERCUTANEOUS WITH SURGEON ACCESS;  Surgeon: Hummer Alvaro, MD;  [facility]: [facility];  Service: Urology;  Laterality: Left;   NEPHROLITHOTOMY Left 08/10/2013   Procedure: NEPHROLITHOTOMY PERCUTANEOUS SECOND LOOK/LEFT DIGITAL URETEROSCOPY/BASKETING OF STONE/EXCHANGE OF LEFT URETERAL STENT;  Surgeon: Hummer Alvaro, MD;  Location: [facility];  Service: Urology;  Laterality: Left;   ORCHIOPEXY  child   unlateral undescended testis   PERCUTANEOUS NEPHROSTOLITHOTOMY Left 09-19-2009   dr matilda  @[facility]    TONSILLECTOMY  child   TRACHEOSTOMY  1987   s/p tongue reduction and nasal septum deviation repair for   Sleep apnea    URETEROSCOPY WITH HOLMIUM LASER LITHOTRIPSY Left 04-14-2002   dr matilda  @[facility]    WISDOM TOOTH EXTRACTION     Family History  Problem Relation Age of Onset   Cancer Mother        Ovarian, died age 34.   Diabetes Father    Hypertension Father    Heart failure Father        Died of MI age 39.   Breast cancer Maternal Grandmother    Colon cancer Maternal Grandfather    Esophageal cancer Neg Hx    Stomach cancer Neg Hx    Rectal cancer Neg Hx    Social History   Socioeconomic History   Marital status: Married    Spouse name: Verneita   Number of children: 3   Years of education: Not on file   Highest education level: Master's degree (e.g., MA, MS, MEng, MEd, MSW, MBA)  Occupational History   Occupation: Retired, Programmer, multimedia.    Employer:  [employer]  Tobacco Use   Smoking status: Former    Current packs/day: 0.00    Average packs/day: 1 pack/day for 8.0 years (8.0  ttl pk-yrs)    Types: Cigarettes    Start date: 05/12/1960    Quit date: 05/12/1968    Years since quitting: 55.5   Smokeless tobacco: Never  Vaping Use   Vaping status: Never Used  Substance and Sexual Activity   Alcohol use: Not Currently   Drug use: Never   Sexual activity: Yes    Birth control/protection: None  Other Topics Concern   Not on file  Social History Narrative   Married, wife Verneita.  1 daughter, 2 sons. Daughter and grandchildren currently reside with them. 1 dog and 1 cat as pets. Enjoys reading and photography.    Social Drivers of Health   Financial Resource Strain: High Risk (11/02/2023)   Overall Financial Resource Strain (CARDIA)    Difficulty of Paying Living Expenses: Hard  Food Insecurity: No Food Insecurity (11/02/2023)   Hunger Vital Sign    Worried About Running Out of Food in the Last Year: Never true    Ran Out of Food in the Last Year: Never true  Transportation Needs: No Transportation Needs (11/02/2023)   PRAPARE - Administrator, Civil Service  (Medical): No    Lack of Transportation (Non-Medical): No  Physical Activity: Inactive (11/02/2023)   Exercise Vital Sign    Days of Exercise per Week: 0 days    Minutes of Exercise per Session: Not on file  Stress: Stress Concern Present (11/02/2023)   [employer] of Occupational Health - Occupational Stress Questionnaire    Feeling of Stress: Rather much  Social Connections: Socially Isolated (11/02/2023)   Social Connection and Isolation Panel    Frequency of Communication with Friends and Family: Once a week    Frequency of Social Gatherings with Friends and Family: Once a week    Attends Religious Services: Never    Database administrator or Organizations: No    Attends Engineer, structural: Never    Marital Status: Married    Tobacco Counseling Counseling given: Yes    Clinical Intake:  Pre-visit preparation completed: Yes  Pain : No/denies pain     BMI - recorded: 46.49 Nutritional Status: BMI > 30  Obese Nutritional Risks: None Diabetes: No  Lab Results  Component Value Date   HGBA1C 5.8 01/31/2020   HGBA1C 6.0 04/26/2019   HGBA1C 5.8 05/28/2018     How often do you need to have someone help you when you read instructions, pamphlets, or other written materials from your doctor or pharmacy?: 1 - Never  Interpreter Needed?: No  Information entered by :: Alia t/cma   Activities of Daily Living     11/02/2023    1:27 PM  In your present state of health, do you have any difficulty performing the following activities:  Hearing? 0  Vision? 0  Difficulty concentrating or making decisions? 0  Walking or climbing stairs? 0  Dressing or bathing? 0  Doing errands, shopping? 0  Preparing Food and eating ? N  Using the Toilet? N  In the past six months, have you accidently leaked urine? Y  Do you have problems with loss of bowel control? N  Managing your Medications? N  Managing your Finances? N  Housekeeping or managing your Housekeeping? N     Patient Care Team: Zollie Lowers, MD as PCP - General (Family Medicine) Alvaro Ricardo KATHEE Raddle., MD as Consulting Physician (Urology) Gerome Charleston, MD (Inactive) as Consulting Physician (Orthopedic Surgery) Vicci Mcardle, OD (Optometry)  I have updated your Care  Teams any recent Medical Services you may have received from other providers in the past year.     Assessment:   This is a routine wellness examination for Jantzen.  Hearing/Vision screen Hearing Screening - Comments:: Pt denies hearing dif Vision Screening - Comments:: Pt wears glasses denies vision dif/pt goes to [facility] in [place], [place]/last ov last yr   Goals Addressed             This Visit's Progress    Patient Stated       Per pt would like to get their finance in order        Depression Screen     11/02/2023    1:32 PM 08/11/2023    4:08 PM 02/26/2023   12:59 PM 02/26/2023   12:52 PM 01/21/2023    9:58 AM 01/21/2023    9:45 AM 10/30/2022    9:46 AM  PHQ 2/9 Scores  PHQ - 2 Score 1 3 4  0 3 0 0  PHQ- 9 Score 3 9 14  8       Fall Risk     11/02/2023    1:22 PM 02/26/2023   12:52 PM 01/21/2023    9:45 AM 10/30/2022    9:44 AM 07/17/2022    3:23 PM  Fall Risk   Falls in the past year? 1 0 0 0 0  Number falls in past yr: 1   0   Injury with Fall? 0   0   Risk for fall due to : Impaired balance/gait;Impaired mobility   No Fall Risks   Follow up Falls evaluation completed;Education provided;Falls prevention discussed   Falls prevention discussed     MEDICARE RISK AT HOME:  Medicare Risk at Home Any stairs in or around the home?: Yes If so, are there any without handrails?: Yes Home free of loose throw rugs in walkways, pet beds, electrical cords, etc?: Yes Adequate lighting in your home to reduce risk of falls?: Yes Life alert?: No Use of a cane, walker or w/c?: Yes Grab bars in the bathroom?: Yes Shower chair or bench in shower?: No Elevated toilet seat or a handicapped toilet?: Yes  TIMED  UP AND GO:  Was the test performed?  no  Cognitive Function: 6CIT completed        11/02/2023    1:35 PM 10/30/2022    9:47 AM 10/28/2021    9:53 AM 10/25/2020   10:54 AM 10/25/2019   10:50 AM  6CIT Screen  What Year? 0 points 0 points 0 points 0 points 0 points  What month? 0 points 0 points 0 points 0 points 0 points  What time? 0 points 0 points 0 points 0 points 0 points  Count back from 20 0 points 0 points 0 points 0 points 0 points  Months in reverse 0 points 0 points 0 points 0 points 0 points  Repeat phrase 0 points 0 points 0 points 0 points 0 points  Total Score 0 points 0 points 0 points 0 points 0 points    Immunizations Immunization History  Administered Date(s) Administered   Fluad Quad(high Dose 65+) 05/02/2019, 01/31/2020, 04/23/2021, 02/27/2022   Fluad Trivalent(High Dose 65+) 01/21/2023   Influenza,inj,Quad PF,6+ Mos 06/20/2013, 02/11/2017, 02/09/2018   Influenza-Unspecified 07/26/2015   Moderna SARS-COV2 Booster Vaccination 03/14/2020   Moderna Sars-Covid-2 Vaccination 06/28/2019, 07/26/2019, 12/26/2020   Pneumococcal Conjugate-13 01/08/2016   Pneumococcal Polysaccharide-23 08/12/2017   Tdap 01/31/2020   Zoster Recombinant(Shingrix ) 09/03/2021, 02/27/2022    Screening Tests  Health Maintenance  Topic Date Due   Colonoscopy  03/28/2021   COVID-19 Vaccine (5 - 2024-25 season) 01/11/2023   INFLUENZA VACCINE  12/11/2023   Medicare Annual Wellness (AWV)  11/01/2024   DTaP/Tdap/Td (2 - Td or Tdap) 01/30/2030   Pneumococcal Vaccine: 50+ Years  Completed   Hepatitis C Screening  Completed   Zoster Vaccines- Shingrix   Completed   HPV VACCINES  Aged Out   Meningococcal B Vaccine  Aged Out    Health Maintenance  Health Maintenance Due  Topic Date Due   Colonoscopy  03/28/2021   COVID-19 Vaccine (5 - 2024-25 season) 01/11/2023   Health Maintenance Items Addressed: See Nurse Notes at the end of this note  Additional Screening:  Vision Screening:  Recommended annual ophthalmology exams for early detection of glaucoma and other disorders of the eye. Would you like a referral to an eye doctor? No    Dental Screening: Recommended annual dental exams for proper oral hygiene  Community Resource Referral / Chronic Care Management: CRR required this visit?  No   CCM required this visit?  No   Plan:    I have personally reviewed and noted the following in the patient's chart:   Medical and social history Use of alcohol, tobacco or illicit drugs  Current medications and supplements including opioid prescriptions. Patient is not currently taking opioid prescriptions. Functional ability and status Nutritional status Physical activity Advanced directives List of other physicians Hospitalizations, surgeries, and ER visits in previous 12 months Vitals Screenings to include cognitive, depression, and falls Referrals and appointments  In addition, I have reviewed and discussed with patient certain preventive protocols, quality metrics, and best practice recommendations. A written personalized care plan for preventive services as well as general preventive health recommendations were provided to patient.   Ozie Ned, CMA   11/02/2023   After Visit Summary: (MyChart) Due to this being a telephonic visit, the after visit summary with patients personalized plan was offered to patient via MyChart   Notes: Pt is aware and due for Colonoscopy--placed order

## 2023-11-03 ENCOUNTER — Telehealth: Payer: Self-pay

## 2023-11-03 NOTE — Progress Notes (Signed)
 Complex Care Management Note  Care Guide Note 11/03/2023 Name: Dennis Spencer MRN: [id number] DOB: 12-15-1945  Dennis Spencer is a 78 y.o. year old male who sees Zollie Lowers, MD for primary care. I reached out to Dennis Spencer by phone today to offer complex care management services.  Dennis Spencer was given information about Complex Care Management services today including:   The Complex Care Management services include support from the care team which includes your Nurse Care Manager, Clinical Social Worker, or Pharmacist.  The Complex Care Management team is here to help remove barriers to the health concerns and goals most important to you. Complex Care Management services are voluntary, and the patient may decline or stop services at any time by request to their care team member.   Complex Care Management Consent Status: Patient agreed to services and verbal consent obtained.   Follow up plan:  Telephone appointment with complex care management team member scheduled for:  BSW 11/04/2023 RNCM 12/02/2023  Encounter Outcome:  Patient Scheduled  Jeoffrey Buffalo , RMA     [place]  [facility], [facility] Guide  Direct Dial: [phone]  Website: [email or web address]

## 2023-11-04 ENCOUNTER — Other Ambulatory Visit: Payer: Self-pay

## 2023-11-04 NOTE — Patient Outreach (Addendum)
 Complex Care Management   Visit Note  11/04/2023  Name:  Dennis Spencer MRN: [id number] DOB: 25-Dec-1945  Situation: Referral received for Complex Care Management related to SDOH Barriers:  Food insecurity I obtained verbal consent from Patient.  Visit completed with patient  on the phone  Background:   Past Medical History:  Diagnosis Date   Anticoagulant long-term use    brilinta    Arthritis    Knees   Carotid stenosis, bilateral    per duplex 04-23-2018  bilateral ICA <50%   Chronic cough    per pt this normal due to tracheostomy   CKD (chronic kidney disease), stage III (HCC) 11/10/2013   Dyspnea    GERD (gastroesophageal reflux disease)    History of CVA (cerebrovascular accident) 04/13/2018   chronic lacunar infarct--- per pt no residuals   History of kidney stones    multiple kidney stones   History of osteomyelitis age 82   LLE   Hyperlipidemia    Hypertension    OSA (obstructive sleep apnea) study in epic 09-25-2005  severe osa   1981 s/p tracheostomy, tongue reduction and septoplasty;  LEAVES TRACH OPEN AT NIGHT FOR SLEEP APNEA   Presence of tracheostomy (HCC) since 1981   uncaps at night due to sleep apnea - caps during the day   Renal calculi    bilateral   Severe obesity (BMI >= 40) (HCC)    Stroke (HCC) 2019   Possible TIA in the past   Urgency of urination    Wears glasses     Assessment:  Patient reports that he and his wife support his daughter and has custody of their 49 year old grandson.  Due to supporting their daughter, they have credit card debit.  Patient has sold his home and is approved for an apartment in 2 months.  Patient feels he is not in financial crisis at this time but could use help with extra  food.  Patient reports he is over income for foodstamps.  Patient is currently receiving counseling from  [place] for the past 3 years every 4-5 weeks. SW provides a list of food bank in the area. Patient does not request a follow up.  SDOH  Interventions    Flowsheet Row Patient Outreach Telephone from 11/04/2023 in [facility] Clinical Support from 11/02/2023 in [facility] Visit from 02/26/2023 in [facility] Visit from 01/21/2023 in [facility] Clinical Support from 10/30/2022 in [facility] Visit from 07/17/2022 in [facility]  SDOH Interventions        Food Insecurity Interventions Intervention Not Indicated Intervention Not Indicated -- -- Intervention Not Indicated --  Housing Interventions Intervention Not Indicated Intervention Not Indicated -- -- Intervention Not Indicated --  Transportation Interventions Intervention Not Indicated Intervention Not Indicated -- -- Intervention Not Indicated --  Utilities Interventions Intervention Not Indicated Intervention Not Indicated -- -- -- --  Alcohol Usage Interventions -- Intervention Not Indicated (Score <7) -- -- Intervention Not Indicated (Score <7) --  Depression Interventions/Treatment  -- PHQ2-9 Score <4 Follow-up Not Indicated Currently on Treatment Currently on Treatment -- Patient refuses Treatment  Financial Strain Interventions Community Resources Provided  [food bank] Other (Comment)  [[email or web address]] -- -- Intervention Not Indicated --  Physical Activity Interventions -- Community Resources Provided  [[id number]] -- -- Intervention Not Indicated --  Stress Interventions --  Other (Comment)  [[id number]] -- -- Intervention Not Indicated --  Social Connections Interventions -- -- -- -- Intervention Not Indicated --  Health Literacy Interventions -- Intervention Not Indicated -- -- -- --      Recommendation:   None  Follow Up Plan:   Patient has met all care management goals. Care Management case will be closed. Patient has been provided contact information should new  needs arise.   Tillman Gardener, BSW [place]  [facility], [facility] Social Worker Direct Dial: [phone]  Fax: [phone] Website: [email or web address]

## 2023-11-04 NOTE — Patient Instructions (Signed)
 Visit Information  Thank you for taking time to visit with me today. Please don't hesitate to contact me if I can be of assistance to you before our next scheduled appointment.   Following is a copy of your care plan:   Goals Addressed             This Visit's Progress    BSW VBCI Social Work Care Plan       Problems:   Food Insecurity   CSW Clinical Goal(s):   Over the next 30 days days the Patient will will follow up with food banks as directed by Social Work.  Interventions:  Social Determinants of Health in Patient with CKD Stage 3 and HTN: SDOH assessments completed: Food Insecurity  Evaluation of current treatment plan related to unmet needs Food resources: provided food bank list  Patient Goals/Self-Care Activities:  Follow up with food banks for community food options.  Plan:   No further follow up required: Patient does not request a follow up.        Please call 911 if you are experiencing a Mental Health or Behavioral Health Crisis or need someone to talk to.  Patient verbalizes understanding of instructions and care plan provided today and agrees to view in MyChart. Active MyChart status and patient understanding of how to access instructions and care plan via MyChart confirmed with patient.     Tillman Gardener, BSW [place]  [facility], [facility] Social Worker Direct Dial: [phone]  Fax: [phone] Website: [email or web address]

## 2023-12-02 ENCOUNTER — Encounter: Payer: Self-pay | Admitting: *Deleted

## 2023-12-02 ENCOUNTER — Other Ambulatory Visit: Payer: Self-pay | Admitting: *Deleted

## 2023-12-02 NOTE — Patient Instructions (Addendum)
 Visit Information  Thank you for taking time to visit with me today. Please don't hesitate to contact me if I can be of assistance to you before our next scheduled appointment.  Our next appointment is no further scheduled appointments.  Please call the care guide team at [phone] if you need to cancel or reschedule your appointment.   Following is a copy of your care plan:   Goals Addressed             This Visit's Progress    COMPLETED: VBCI RN Care Plan       Problems:  Chronic Disease Management support and education needs related to health maintenance  Goal: Over the next 90 days the Patient will demonstrate Improved and Ongoing health management independence as evidenced by scheduling dental and eye appointments for preventative measures         Interventions:   Health Maintenance Interventions: Patient interviewed about adult health maintenance status including  regular eye checkups & regular dental care . Patient reports that due to finances he has not seen an eye doctor or dentist. He states after the sell of his home, he will be able to address these needs.  Patient Self-Care Activities:  Attend church or other social activities Call provider office for new concerns or questions  Perform all self care activities independently   Plan:  No further follow up required: Does not meet criteria for program             Please call the Suicide and Crisis Lifeline: 988 call the [place]  National Suicide Prevention Lifeline: 2194598987 or TTY: 2720835764 TTY (747)285-9930) to talk to a trained counselor call 1-800-273-TALK (toll free, 24 hour hotline) call the [facility]: [phone] call 911 if you are experiencing a Mental Health or Behavioral Health Crisis or need someone to talk to.  Patient verbalizes understanding of instructions and care plan provided today and agrees to view in MyChart. Active MyChart status and patient understanding  of how to access instructions and care plan via MyChart confirmed with patient.     Rosina Forte, BSN RN [facility], [facility] RN Care Manager Direct Dial: [phone]  Fax: [phone]

## 2023-12-02 NOTE — Patient Outreach (Signed)
 Complex Care Management   Visit Note  12/02/2023  Name:  Dennis Spencer MRN: [id number] DOB: 1946/01/23  Situation: Referral received for Complex Care Management related to SDOH Barriers:  Food insecurity Financial Resource Strain I obtained verbal consent from Patient.  Visit completed with patient  on the phone  Background:   Past Medical History:  Diagnosis Date   Anticoagulant long-term use    brilinta    Arthritis    Knees   Carotid stenosis, bilateral    per duplex 04-23-2018  bilateral ICA <50%   Chronic cough    per pt this normal due to tracheostomy   CKD (chronic kidney disease), stage III (HCC) 11/10/2013   Dyspnea    GERD (gastroesophageal reflux disease)    History of CVA (cerebrovascular accident) 04/13/2018   chronic lacunar infarct--- per pt no residuals   History of kidney stones    multiple kidney stones   History of osteomyelitis age 61   LLE   Hyperlipidemia    Hypertension    OSA (obstructive sleep apnea) study in epic 09-25-2005  severe osa   1981 s/p tracheostomy, tongue reduction and septoplasty;  LEAVES TRACH OPEN AT NIGHT FOR SLEEP APNEA   Presence of tracheostomy (HCC) since 1981   uncaps at night due to sleep apnea - caps during the day   Renal calculi    bilateral   Severe obesity (BMI >= 40) (HCC)    Stroke (HCC) 2019   Possible TIA in the past   Urgency of urination    Wears glasses     Assessment: Patient Reported Symptoms:  Cognitive Cognitive Status: No symptoms reported Cognitive/Intellectual Conditions Management [RPT]: None reported or documented in medical history or problem list   Health Maintenance Behaviors: Annual physical exam Healing Pattern: Average Health Facilitated by: Stress management  Neurological Neurological Review of Symptoms: No symptoms reported Neurological Management Strategies: Routine screening Neurological Self-Management Outcome: 4 (good)  HEENT HEENT Symptoms Reported: Tinnitus HEENT Management  Strategies: Routine screening HEENT Self-Management Outcome: 4 (good)    Cardiovascular Cardiovascular Symptoms Reported: No symptoms reported Does patient have uncontrolled Hypertension?: No Cardiovascular Management Strategies: Routine screening, Medication therapy Cardiovascular Self-Management Outcome: 4 (good)  Respiratory Respiratory Symptoms Reported: No symptoms reported Respiratory Management Strategies: Routine screening Respiratory Self-Management Outcome: 4 (good)  Endocrine Endocrine Symptoms Reported: No symptoms reported Is patient diabetic?: No Endocrine Self-Management Outcome: 4 (good)  Gastrointestinal Gastrointestinal Symptoms Reported: No symptoms reported Gastrointestinal Self-Management Outcome: 4 (good)    Genitourinary Genitourinary Symptoms Reported: Urgency Genitourinary Self-Management Outcome: 3 (uncertain) Genitourinary Comment: Urology appointment coming up. Reports increased urgency as he ages  Integumentary Integumentary Symptoms Reported: No symptoms reported Skin Management Strategies: Routine screening Skin Self-Management Outcome: 4 (good)  Musculoskeletal Musculoskelatal Symptoms Reviewed: Difficulty walking, Other Other Musculoskeletal Symptoms: Reports needing knee replacement but was advised he needed to loose about 100 lbs. Reports 1-2/10 pain Musculoskeletal Management Strategies: Medical device, Routine screening Musculoskeletal Self-Management Outcome: 3 (uncertain) Falls in the past year?: Yes Number of falls in past year: 2 or more Was there an injury with Fall?: No Fall Risk Category Calculator: 2 Patient Fall Risk Level: Moderate Fall Risk Patient at Risk for Falls Due to: History of fall(s), Impaired mobility Fall risk Follow up: Falls evaluation completed  Psychosocial Psychosocial Symptoms Reported: No symptoms reported Behavioral Management Strategies: Support system Behavioral Health Self-Management Outcome: 4 (good) Major  Change/Loss/Stressor/Fears (CP): Denies Techniques to Cope with Loss/Stress/Change: Not applicable Quality of Family Relationships: supportive Do you feel physically  threatened by others?: No      12/02/2023    1:13 PM  Depression screen PHQ 2/9  Decreased Interest 0  Down, Depressed, Hopeless 0  PHQ - 2 Score 0    There were no vitals filed for this visit.  Medications Reviewed Today     Reviewed by Bertrum Rosina HERO, RN (Registered Nurse) on 12/02/23 at 1310  Med List Status: <None>   Medication Order Taking? Sig Documenting Provider Last Dose Status Informant  amLODipine  (NORVASC ) 5 MG tablet [id number] Yes Take 1 tablet (5 mg total) by mouth in the morning. Zollie Lowers, MD  Active   atorvastatin  (LIPITOR) 80 MG tablet [id number] Yes Take 1 tablet (80 mg total) by mouth daily. Zollie Lowers, MD  Active   b complex vitamins tablet [id number] Yes Take 1 tablet by mouth daily. [provider]  Active Self           Med Note HAZEL, GINA C   Fri Apr 02, 2018 12:13 PM)    Calcium  Citrate-Vitamin D  (CITRACAL + D PO) [id number] Yes Take 1 tablet by mouth 3 (three) times daily.  [provider]  Active Self           Med Note HAZEL, GINA C   Fri Apr 02, 2018 12:13 PM)    Cholecalciferol  (VITAMIN D3) 2000 UNITS capsule [id number] Yes Take 2,000 Units by mouth 2 (two) times daily.  [provider]  Active Self           Med Note HAZEL, GINA C   Fri Apr 02, 2018 12:12 PM)    escitalopram  (LEXAPRO ) 20 MG tablet [id number] Yes Take 1 tablet (20 mg total) by mouth daily. Zollie Lowers, MD  Active   gabapentin  (NEURONTIN ) 600 MG tablet [id number] Yes Take 1 each morning and two every evening. Zollie Lowers, MD  Active   Multiple Vitamin (MULTIVITAMIN WITH MINERALS) TABS tablet [id number] Yes Take 1 tablet by mouth daily. [provider]  Active Self           Med Note HAZEL, GINA C   Fri Apr 02, 2018 12:12 PM)    pantoprazole  (PROTONIX )  40 MG tablet [id number] Yes Take 1 tablet (40 mg total) by mouth daily. For stomach Zollie Lowers, MD  Active   ticagrelor  (BRILINTA ) 60 MG TABS tablet [id number] Yes Take 1 tablet (60 mg total) by mouth 2 (two) times daily. Zollie Lowers, MD  Active   vitamin C  (ASCORBIC ACID ) 500 MG tablet [id number] Yes Take 500 mg by mouth 2 (two) times daily. [provider]  Active Self           Med Note HAZEL, GINA C   Fri Apr 02, 2018 12:12 PM)              Recommendation:   Continue Current Plan of Care  Follow Up Plan:   Patient has met all care management goals. Care Management case will be closed. Patient has been provided contact information should new needs arise.   Rosina Bertrum, BSN RN [facility], [facility] RN Care Manager Direct Dial: [phone]  Fax: [phone]

## 2023-12-08 DIAGNOSIS — N200 Calculus of kidney: Secondary | ICD-10-CM | POA: Diagnosis not present

## 2023-12-08 DIAGNOSIS — N5201 Erectile dysfunction due to arterial insufficiency: Secondary | ICD-10-CM | POA: Diagnosis not present

## 2024-01-18 ENCOUNTER — Encounter: Payer: Self-pay | Admitting: Family Medicine

## 2024-01-26 ENCOUNTER — Encounter: Payer: Self-pay | Admitting: Family Medicine

## 2024-01-28 NOTE — Telephone Encounter (Signed)
 I spoke with [name] regarding release of information of information to him.  I explained that we do not have his legal guardian paperwork, neither is his name listed on the Designated Party Release.  He will have the information brought in and update the DPR for Dennis Spencer.

## 2024-02-02 ENCOUNTER — Other Ambulatory Visit: Payer: Self-pay | Admitting: Family Medicine

## 2024-02-02 DIAGNOSIS — E7800 Pure hypercholesterolemia, unspecified: Secondary | ICD-10-CM

## 2024-02-24 ENCOUNTER — Other Ambulatory Visit: Payer: Self-pay | Admitting: Family Medicine

## 2024-02-24 DIAGNOSIS — E7800 Pure hypercholesterolemia, unspecified: Secondary | ICD-10-CM

## 2024-02-25 NOTE — Telephone Encounter (Signed)
 Appt scheduled for 03/02/2024

## 2024-02-25 NOTE — Telephone Encounter (Signed)
 Stacks pt NTBS 30-d given 02/02/24

## 2024-02-28 ENCOUNTER — Other Ambulatory Visit: Payer: Self-pay | Admitting: Family Medicine

## 2024-02-28 DIAGNOSIS — I10 Essential (primary) hypertension: Secondary | ICD-10-CM

## 2024-02-28 DIAGNOSIS — E7800 Pure hypercholesterolemia, unspecified: Secondary | ICD-10-CM

## 2024-03-01 ENCOUNTER — Ambulatory Visit: Admitting: Family Medicine

## 2024-03-02 ENCOUNTER — Ambulatory Visit: Admitting: Family Medicine

## 2024-03-14 ENCOUNTER — Ambulatory Visit: Payer: Self-pay | Admitting: Family Medicine

## 2024-03-14 VITALS — BP 135/73 | HR 63 | Temp 98.1°F | Ht 70.0 in | Wt 338.0 lb

## 2024-03-14 DIAGNOSIS — I10 Essential (primary) hypertension: Secondary | ICD-10-CM | POA: Diagnosis not present

## 2024-03-14 DIAGNOSIS — G8929 Other chronic pain: Secondary | ICD-10-CM

## 2024-03-14 DIAGNOSIS — Z23 Encounter for immunization: Secondary | ICD-10-CM

## 2024-03-14 DIAGNOSIS — E559 Vitamin D deficiency, unspecified: Secondary | ICD-10-CM | POA: Diagnosis not present

## 2024-03-14 DIAGNOSIS — M25561 Pain in right knee: Secondary | ICD-10-CM

## 2024-03-14 DIAGNOSIS — E7800 Pure hypercholesterolemia, unspecified: Secondary | ICD-10-CM | POA: Diagnosis not present

## 2024-03-14 DIAGNOSIS — N401 Enlarged prostate with lower urinary tract symptoms: Secondary | ICD-10-CM | POA: Diagnosis not present

## 2024-03-14 DIAGNOSIS — M25562 Pain in left knee: Secondary | ICD-10-CM

## 2024-03-14 DIAGNOSIS — K219 Gastro-esophageal reflux disease without esophagitis: Secondary | ICD-10-CM

## 2024-03-14 DIAGNOSIS — R3914 Feeling of incomplete bladder emptying: Secondary | ICD-10-CM

## 2024-03-14 LAB — URINALYSIS
Bilirubin, UA: NEGATIVE
Glucose, UA: NEGATIVE
Nitrite, UA: NEGATIVE
RBC, UA: NEGATIVE
Specific Gravity, UA: 1.02 (ref 1.005–1.030)
Urobilinogen, Ur: 2 mg/dL — ABNORMAL HIGH (ref 0.2–1.0)
pH, UA: 6 (ref 5.0–7.5)

## 2024-03-14 LAB — LIPID PANEL

## 2024-03-14 MED ORDER — DULOXETINE HCL 60 MG PO CPEP: 60.0000 mg | ORAL_CAPSULE | Freq: Every day | ORAL | 1 refills | Status: AC

## 2024-03-14 MED ORDER — GABAPENTIN 600 MG PO TABS: 1200.0000 mg | ORAL_TABLET | Freq: Two times a day (BID) | ORAL | 1 refills | Status: AC

## 2024-03-14 MED ORDER — TICAGRELOR 60 MG PO TABS: 60.0000 mg | ORAL_TABLET | Freq: Two times a day (BID) | ORAL | 1 refills | Status: AC

## 2024-03-14 MED ORDER — GABAPENTIN 600 MG PO TABS: ORAL_TABLET | ORAL | 3 refills | Status: DC

## 2024-03-14 MED ORDER — ATORVASTATIN CALCIUM 80 MG PO TABS: 80.0000 mg | ORAL_TABLET | Freq: Every day | ORAL | 0 refills | Status: DC

## 2024-03-14 NOTE — Progress Notes (Signed)
 Subjective:  Patient ID: Dennis Spencer, male    DOB: [birth year]  Age: 78 y.o. MRN: [id number]  CC: Medication Refill, Medical Management of Chronic Issues, and Knee Pain (Ongoing knee pain and mobility)   HPI  Discussed the use of AI scribe software for clinical note transcription with the patient, who gave verbal consent to proceed.  History of Present Illness Dennis Spencer is a 78 year old male who presents for medication refills and evaluation of his current symptoms.  He has been experiencing increased mood disturbances, which he attributes to recent life changes, including downsizing his home and family-related stress. He continues to take citalopram (Lexapro ) for depression, which he finds helpful, particularly in managing anxiety related to phone calls. However, he notes persistent tiredness and decreased energy, which he associates with his mood and lifestyle changes.  He describes significant knee pain that limits his mobility and ability to stand for long periods. He is currently taking gabapentin  for pain management, which provides some relief when he is resting with his legs elevated, but it does not fully alleviate the discomfort. He finds it challenging to remain on his feet for extended periods, especially when out shopping, and often needs to sit down to relieve pressure on his knees. He mentions using electric carts in stores to assist with mobility but tries to avoid them to encourage physical activity.  His social history reveals that he and his wife have recently moved to a smaller home, and his son has taken over their finances, managing their Social Security and pension income. His daughter and granddaughter, along with his two great-grandchildren, are living together, which adds to his stress. He and his wife are trying to focus on themselves and manage family demands.  He reports that his urine flow is generally good, though sometimes weaker than it used to  be, and he does not have to get up at night to urinate. He notes occasional dribbling if he waits too long to go, particularly when he reaches the bathroom, but not after voiding.          03/14/2024    2:38 PM 12/02/2023    1:13 PM 11/02/2023    1:32 PM  Depression screen PHQ 2/9  Decreased Interest 1 0 0  Down, Depressed, Hopeless 1 0 1  PHQ - 2 Score 2 0 1  Altered sleeping 0  0  Tired, decreased energy 2  2  Change in appetite 0  0  Feeling bad or failure about yourself  1  0  Trouble concentrating 0  0  Moving slowly or fidgety/restless 2  0  Suicidal thoughts 0  0  PHQ-9 Score 7  3  Difficult doing work/chores Somewhat difficult  Not difficult at all    History Dennis Spencer has a past medical history of Anticoagulant long-term use, Arthritis, Carotid stenosis, bilateral, Chronic cough, CKD (chronic kidney disease), stage III (HCC) (11/10/2013), Dyspnea, GERD (gastroesophageal reflux disease), History of CVA (cerebrovascular accident) (04/13/2018), History of kidney stones, History of osteomyelitis (age 61), Hyperlipidemia, Hypertension, OSA (obstructive sleep apnea) (study in epic 09-25-2005  severe osa), Presence of tracheostomy (HCC) (since 1981), Renal calculi, Severe obesity (BMI >= 40) (HCC), Stroke (HCC) (2019), Urgency of urination, and Wears glasses.   He has a past surgical history that includes Cystoscopy with retrograde pyelogram, ureteroscopy and stent placement (Bilateral, 06/18/2013); Holmium laser application (Bilateral, 06/18/2013); Tracheostomy (1987); Nephrolithotomy (Left, 08/08/2013); Cystoscopy with retrograde pyelogram, ureteroscopy and stent placement (Right, 08/08/2013); Nephrolithotomy (  Left, 08/10/2013); Cystoscopy with retrograde pyelogram, ureteroscopy and stent placement (Right, 08/10/2013); Holmium laser application (Left, 08/10/2013); Cholecystectomy (N/A, 06/15/2015); Colonoscopy (last one 03-28-2016); Cystoscopy with retrograde pyelogram, ureteroscopy and stent placement  (Bilateral, 05/22/2017); Holmium laser application (Bilateral, 05/22/2017); Cystoscopy with retrograde pyelogram, ureteroscopy and stent placement (Bilateral, 06/05/2017); Cystoscopy/ureteroscopy/holmium laser/stent placement (Left, 06/26/2017); Cystoscopy w/ ureteral stent removal (Right, 06/26/2017); Tonsillectomy (child); Gastric Roux-En-Y (2004    @[facility] ); Percutaneous nephrostolithotomy (Left, 09-19-2009   dr matilda  @[facility] ); Ureteroscopy with holmium laser lithotripsy (Left, 04-14-2002   dr matilda  @[facility] ); Orchiopexy (child); Cystoscopy with retrograde pyelogram, ureteroscopy and stent placement (Bilateral, 08/13/2018); Holmium laser application (Bilateral, 08/13/2018); Wisdom tooth extraction; Cystoscopy with retrograde pyelogram, ureteroscopy and stent placement (Bilateral, 10/22/2018); Holmium laser application (Bilateral, 10/22/2018); Cystoscopy w/ ureteral stent removal (Bilateral, 10/22/2018); Cystoscopy with retrograde pyelogram, ureteroscopy and stent placement (Bilateral, 12/23/2019); Holmium laser application (Bilateral, 12/23/2019); Cystoscopy with litholapaxy (N/A, 12/23/2019); and Cystoscopy with retrograde pyelogram, ureteroscopy and stent placement (Bilateral, 01/06/2020).   His family history includes Breast cancer in his maternal grandmother; Cancer in his mother; Colon cancer in his maternal grandfather; Diabetes in his father; Heart failure in his father; Hypertension in his father.He reports that he quit smoking about 55 years ago. His smoking use included cigarettes. He started smoking about 63 years ago. He has a 8 pack-year smoking history. He has never used smokeless tobacco. He reports that he does not currently use alcohol. He reports that he does not use drugs.    ROS Review of Systems  Constitutional: Negative.   HENT: Negative.    Eyes:  Negative for visual disturbance.  Respiratory:  Negative for cough and shortness of breath.   Cardiovascular:  Negative for chest pain and leg  swelling.  Gastrointestinal:  Negative for abdominal pain, diarrhea, nausea and vomiting.  Genitourinary:  Negative for difficulty urinating.  Musculoskeletal:  Negative for arthralgias and myalgias.  Skin:  Negative for rash.  Neurological:  Negative for headaches.  Psychiatric/Behavioral:  Negative for sleep disturbance.     Objective:  BP 135/73   Pulse 63   Temp 98.1 F (36.7 C)   Ht 5' 10 (1.778 m)   Wt (!) 338 lb (153.3 kg)   SpO2 97%   BMI 48.50 kg/m   BP Readings from Last 3 Encounters:  03/14/24 135/73  11/02/23 (!) 125/59  08/11/23 (!) 125/59    Wt Readings from Last 3 Encounters:  03/14/24 (!) 338 lb (153.3 kg)  11/02/23 (!) 324 lb (147 kg)  08/11/23 (!) 324 lb (147 kg)     Physical Exam Vitals reviewed.  Constitutional:      Appearance: He is well-developed.  HENT:     Head: Normocephalic and atraumatic.     Right Ear: External ear normal.     Left Ear: External ear normal.     Mouth/Throat:     Pharynx: No oropharyngeal exudate or posterior oropharyngeal erythema.  Eyes:     Pupils: Pupils are equal, round, and reactive to light.  Cardiovascular:     Rate and Rhythm: Normal rate and regular rhythm.     Heart sounds: No murmur heard. Pulmonary:     Effort: No respiratory distress.     Breath sounds: Normal breath sounds.  Musculoskeletal:     Cervical back: Normal range of motion and neck supple.  Neurological:     Mental Status: He is alert and oriented to person, place, and time.    Physical Exam VITALS: BP- 135/73 MEASUREMENTS: Weight- 300. GENERAL:  Alert, cooperative, well developed, no acute distress. HEENT: Normocephalic, normal oropharynx, moist mucous membranes. CHEST: Clear to auscultation bilaterally, no wheezes, rhonchi, or crackles. CARDIOVASCULAR: Normal heart rate and rhythm, S1 and S2 normal without murmurs. ABDOMEN: Soft, non-tender, non-distended, without organomegaly, normal bowel sounds. EXTREMITIES: No cyanosis or  edema. NEUROLOGICAL: Cranial nerves grossly intact, moves all extremities without gross motor or sensory deficit.   Assessment & Plan:  Encounter for immunization -     Flu vaccine HIGH DOSE PF(Fluzone Trivalent)  Bilateral chronic knee pain -     CBC with Differential/Platelet -     CMP14+EGFR -     Gabapentin ; Take 2 tablets (1,200 mg total) by mouth 2 (two) times daily. Take 1 each morning and two every evening.  Dispense: 360 tablet; Refill: 1  Hypercholesteremia -     Atorvastatin  Calcium ; Take 1 tablet (80 mg total) by mouth daily.  Dispense: 90 tablet; Refill: 0 -     CBC with Differential/Platelet -     CMP14+EGFR -     Lipid panel  Morbid obesity (HCC) -     CBC with Differential/Platelet -     CMP14+EGFR  Primary hypertension -     CBC with Differential/Platelet -     CMP14+EGFR -     Urinalysis  Gastroesophageal reflux disease without esophagitis -     CBC with Differential/Platelet -     CMP14+EGFR  Vitamin D  deficiency -     CBC with Differential/Platelet -     CMP14+EGFR -     VITAMIN D  25 Hydroxy (Vit-D Deficiency, Fractures)  Benign prostatic hyperplasia with incomplete bladder emptying -     PSA, total and free  Other orders -     Ticagrelor ; Take 1 tablet (60 mg total) by mouth 2 (two) times daily.  Dispense: 180 tablet; Refill: 1 -     DULoxetine  HCl; Take 1 capsule (60 mg total) by mouth daily.  Dispense: 90 capsule; Refill: 1    Assessment and Plan Assessment & Plan Bilateral Chronic Knee Pain   Chronic bilateral knee pain is worsened by weight and mobility issues, limiting surgical options as current prosthetic devices cannot support his weight. Gabapentin  provides partial relief. Discussed stronger prosthetic devices at [facility]. Increase gabapentin  to 600 mg, two tablets in the morning and two in the evening. Investigate availability of suitable prosthetic devices at [facility].  Major Depressive Disorder   He experiences increased mood disturbances  and fatigue, likely exacerbated by recent life changes and family stressors. Current treatment with escitalopram  may be insufficient. Discussed switching to duloxetine , which may also help with pain management. Discontinue escitalopram  and initiate duloxetine  60 mg daily.  Primary Hypertension   Blood pressure is well-controlled at 135/73 mmHg.  Pure Hypercholesterolemia   Cholesterol levels need assessment as it has been over a year since the last blood work. Order blood work to assess cholesterol levels.  Benign Prostatic Hyperplasia   Urinary flow is generally good with occasional variability and no nocturia. Possible mild prostate enlargement. Order blood work including a prostate-specific antigen (PSA) test.  General Health Maintenance   Routine health maintenance requires updating. Order blood work for a comprehensive metabolic panel, including blood counts, blood chemistry, kidney and liver functions, electrolytes, and a quick urine test.       Follow-up: Return in about 6 months (around 09/11/2024) for [facility] physical.  Dennis Spencer, M.D.

## 2024-03-15 ENCOUNTER — Ambulatory Visit: Payer: Self-pay | Admitting: Family Medicine

## 2024-03-15 LAB — CBC WITH DIFFERENTIAL/PLATELET
Basophils Absolute: 0.1 x10E3/uL (ref 0.0–0.2)
Basos: 1 %
EOS (ABSOLUTE): 0.2 x10E3/uL (ref 0.0–0.4)
Eos: 3 %
Hematocrit: 42.5 % (ref 37.5–51.0)
Hemoglobin: 13.5 g/dL (ref 13.0–17.7)
Immature Grans (Abs): 0 x10E3/uL (ref 0.0–0.1)
Immature Granulocytes: 0 %
Lymphocytes Absolute: 2.1 x10E3/uL (ref 0.7–3.1)
Lymphs: 28 %
MCH: 27.6 pg (ref 26.6–33.0)
MCHC: 31.8 g/dL (ref 31.5–35.7)
MCV: 87 fL (ref 79–97)
Monocytes Absolute: 0.7 x10E3/uL (ref 0.1–0.9)
Monocytes: 9 %
Neutrophils Absolute: 4.4 x10E3/uL (ref 1.4–7.0)
Neutrophils: 59 %
Platelets: 230 x10E3/uL (ref 150–450)
RBC: 4.9 x10E6/uL (ref 4.14–5.80)
RDW: 14.5 % (ref 11.6–15.4)
WBC: 7.4 x10E3/uL (ref 3.4–10.8)

## 2024-03-15 LAB — CMP14+EGFR
ALT: 15 IU/L (ref 0–44)
AST: 26 IU/L (ref 0–40)
Albumin: 4 g/dL (ref 3.8–4.8)
Alkaline Phosphatase: 118 IU/L (ref 47–123)
BUN/Creatinine Ratio: 8 — AB (ref 10–24)
BUN: 12 mg/dL (ref 8–27)
Bilirubin Total: 0.6 mg/dL (ref 0.0–1.2)
CO2: 25 mmol/L (ref 20–29)
Calcium: 9.5 mg/dL (ref 8.6–10.2)
Chloride: 100 mmol/L (ref 96–106)
Creatinine, Ser: 1.47 mg/dL — AB (ref 0.76–1.27)
Globulin, Total: 2.5 g/dL (ref 1.5–4.5)
Glucose: 101 mg/dL — AB (ref 70–99)
Potassium: 4.7 mmol/L (ref 3.5–5.2)
Sodium: 140 mmol/L (ref 134–144)
Total Protein: 6.5 g/dL (ref 6.0–8.5)
eGFR: 49 mL/min/1.73 — AB (ref >59)

## 2024-03-15 LAB — LIPID PANEL
Cholesterol, Total: 130 mg/dL (ref 100–199)
HDL: 50 mg/dL (ref >39)
LDL CALC COMMENT:: 2.6 ratio (ref 0.0–5.0)
LDL Chol Calc (NIH): 64 mg/dL (ref 0–99)
Triglycerides: 85 mg/dL (ref 0–149)
VLDL Cholesterol Cal: 16 mg/dL (ref 5–40)

## 2024-03-15 LAB — PSA, TOTAL AND FREE
PSA, Free Pct: 45 %
PSA, Free: 0.27 ng/mL
Prostate Specific Ag, Serum: 0.6 ng/mL (ref 0.0–4.0)

## 2024-03-15 LAB — VITAMIN D 25 HYDROXY (VIT D DEFICIENCY, FRACTURES): Vit D, 25-Hydroxy: 52.4 ng/mL (ref 30.0–100.0)

## 2024-03-15 NOTE — Progress Notes (Signed)
Hello Rasool,  Your lab result is normal and/or stable.Some minor variations that are not significant are commonly marked abnormal, but do not represent any medical problem for you.  Best regards, Tove Wideman, M.D.

## 2024-05-27 ENCOUNTER — Other Ambulatory Visit: Payer: Self-pay | Admitting: Family Medicine

## 2024-05-27 DIAGNOSIS — E7800 Pure hypercholesterolemia, unspecified: Secondary | ICD-10-CM

## 2024-09-12 ENCOUNTER — Encounter: Admitting: Family Medicine

## 2024-11-02 ENCOUNTER — Ambulatory Visit: Payer: Self-pay
